# Patient Record
Sex: Male | Born: 1945 | Race: White | Hispanic: No | Marital: Married | Smoking: Former smoker
Health system: Southern US, Community
[De-identification: ages and names within clinical notes are randomized; demographics above are authoritative.]

## PROBLEM LIST (undated history)

## (undated) DIAGNOSIS — I4891 Unspecified atrial fibrillation: Secondary | ICD-10-CM

## (undated) DIAGNOSIS — I5022 Chronic systolic (congestive) heart failure: Secondary | ICD-10-CM

## (undated) DIAGNOSIS — T7840XA Allergy, unspecified, initial encounter: Secondary | ICD-10-CM

## (undated) DIAGNOSIS — G4730 Sleep apnea, unspecified: Secondary | ICD-10-CM

## (undated) DIAGNOSIS — I255 Ischemic cardiomyopathy: Secondary | ICD-10-CM

## (undated) DIAGNOSIS — J45909 Unspecified asthma, uncomplicated: Secondary | ICD-10-CM

## (undated) DIAGNOSIS — M1990 Unspecified osteoarthritis, unspecified site: Secondary | ICD-10-CM

## (undated) DIAGNOSIS — E119 Type 2 diabetes mellitus without complications: Secondary | ICD-10-CM

## (undated) DIAGNOSIS — I4892 Unspecified atrial flutter: Secondary | ICD-10-CM

## (undated) DIAGNOSIS — H349 Unspecified retinal vascular occlusion: Secondary | ICD-10-CM

## (undated) DIAGNOSIS — I119 Hypertensive heart disease without heart failure: Secondary | ICD-10-CM

## (undated) DIAGNOSIS — I2510 Atherosclerotic heart disease of native coronary artery without angina pectoris: Secondary | ICD-10-CM

## (undated) DIAGNOSIS — E785 Hyperlipidemia, unspecified: Secondary | ICD-10-CM

## (undated) HISTORY — DX: Unspecified asthma, uncomplicated: J45.909

## (undated) HISTORY — DX: Chronic systolic (congestive) heart failure: I50.22

## (undated) HISTORY — DX: Ischemic cardiomyopathy: I25.5

## (undated) HISTORY — PX: NECK SURGERY: SHX720

## (undated) HISTORY — DX: Unspecified atrial fibrillation: I48.91

## (undated) HISTORY — DX: Hypertensive heart disease without heart failure: I11.9

## (undated) HISTORY — PX: TONSILLECTOMY: SUR1361

## (undated) HISTORY — DX: Atherosclerotic heart disease of native coronary artery without angina pectoris: I25.10

## (undated) HISTORY — DX: Allergy, unspecified, initial encounter: T78.40XA

## (undated) HISTORY — PX: COLONOSCOPY: SHX174

## (undated) HISTORY — DX: Hyperlipidemia, unspecified: E78.5

## (undated) HISTORY — DX: Unspecified retinal vascular occlusion: H34.9

## (undated) HISTORY — PX: SKIN SURGERY: SHX2413

## (undated) SURGERY — Surgical Case
Anesthesia: *Unknown

---
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
## 2007-10-03 DIAGNOSIS — I2510 Atherosclerotic heart disease of native coronary artery without angina pectoris: Secondary | ICD-10-CM

## 2007-10-03 HISTORY — PX: CORONARY ANGIOPLASTY: SHX604

## 2007-10-03 HISTORY — DX: Atherosclerotic heart disease of native coronary artery without angina pectoris: I25.10

## 2008-01-31 ENCOUNTER — Encounter: Payer: Self-pay | Admitting: Family Medicine

## 2009-03-29 ENCOUNTER — Ambulatory Visit: Payer: Self-pay | Admitting: Family Medicine

## 2009-03-29 DIAGNOSIS — H409 Unspecified glaucoma: Secondary | ICD-10-CM | POA: Insufficient documentation

## 2009-03-29 DIAGNOSIS — G4733 Obstructive sleep apnea (adult) (pediatric): Secondary | ICD-10-CM

## 2009-03-29 DIAGNOSIS — I10 Essential (primary) hypertension: Secondary | ICD-10-CM

## 2009-03-29 DIAGNOSIS — J45909 Unspecified asthma, uncomplicated: Secondary | ICD-10-CM | POA: Insufficient documentation

## 2009-03-29 DIAGNOSIS — J309 Allergic rhinitis, unspecified: Secondary | ICD-10-CM | POA: Insufficient documentation

## 2009-03-29 DIAGNOSIS — Z9989 Dependence on other enabling machines and devices: Secondary | ICD-10-CM

## 2009-03-29 DIAGNOSIS — I2510 Atherosclerotic heart disease of native coronary artery without angina pectoris: Secondary | ICD-10-CM

## 2009-03-29 DIAGNOSIS — E785 Hyperlipidemia, unspecified: Secondary | ICD-10-CM | POA: Insufficient documentation

## 2009-03-29 DIAGNOSIS — H349 Unspecified retinal vascular occlusion: Secondary | ICD-10-CM | POA: Insufficient documentation

## 2009-03-29 LAB — CONVERTED CEMR LAB: PSA: 1.35 ng/mL (ref 0.10–4.00)

## 2009-04-06 LAB — CONVERTED CEMR LAB
ALT: 35 units/L (ref 0–53)
Albumin: 4.1 g/dL (ref 3.5–5.2)
Alkaline Phosphatase: 29 units/L — ABNORMAL LOW (ref 39–117)
BUN: 11 mg/dL (ref 6–23)
Creatinine, Ser: 0.8 mg/dL (ref 0.4–1.5)
GFR calc non Af Amer: 103.7 mL/min (ref >60)
Glucose, Bld: 125 mg/dL — ABNORMAL HIGH (ref 70–99)
LDL Cholesterol: 52 mg/dL (ref 0–99)
Total CHOL/HDL Ratio: 4
Total Protein: 7.1 g/dL (ref 6.0–8.3)
VLDL: 39.8 mg/dL (ref 0.0–40.0)

## 2009-05-14 ENCOUNTER — Telehealth: Payer: Self-pay | Admitting: Family Medicine

## 2009-05-24 ENCOUNTER — Ambulatory Visit: Payer: Self-pay | Admitting: Family Medicine

## 2009-05-24 DIAGNOSIS — L723 Sebaceous cyst: Secondary | ICD-10-CM | POA: Insufficient documentation

## 2009-05-24 LAB — CONVERTED CEMR LAB: HDL goal, serum: 40 mg/dL

## 2009-05-26 LAB — CONVERTED CEMR LAB
BUN: 14 mg/dL (ref 6–23)
CO2: 32 meq/L (ref 19–32)
Calcium: 9.6 mg/dL (ref 8.4–10.5)
Cholesterol: 165 mg/dL (ref 0–200)
Creatinine, Ser: 0.9 mg/dL (ref 0.4–1.5)
Direct LDL: 87.1 mg/dL
Sodium: 139 meq/L (ref 135–145)
Total CHOL/HDL Ratio: 5

## 2009-06-03 ENCOUNTER — Ambulatory Visit: Payer: Self-pay | Admitting: Family Medicine

## 2009-06-03 DIAGNOSIS — E119 Type 2 diabetes mellitus without complications: Secondary | ICD-10-CM | POA: Insufficient documentation

## 2009-06-11 ENCOUNTER — Telehealth: Payer: Self-pay | Admitting: Family Medicine

## 2009-06-16 ENCOUNTER — Encounter: Payer: Self-pay | Admitting: Family Medicine

## 2009-06-16 ENCOUNTER — Ambulatory Visit: Payer: Self-pay | Admitting: Family Medicine

## 2009-06-22 ENCOUNTER — Encounter: Payer: Self-pay | Admitting: Family Medicine

## 2009-07-16 ENCOUNTER — Ambulatory Visit: Payer: Self-pay | Admitting: Gastroenterology

## 2009-07-26 ENCOUNTER — Ambulatory Visit: Payer: Self-pay | Admitting: Gastroenterology

## 2009-07-26 ENCOUNTER — Encounter: Payer: Self-pay | Admitting: Gastroenterology

## 2009-07-28 ENCOUNTER — Encounter: Payer: Self-pay | Admitting: Gastroenterology

## 2009-08-18 ENCOUNTER — Encounter: Payer: Self-pay | Admitting: Family Medicine

## 2009-08-30 ENCOUNTER — Ambulatory Visit: Payer: Self-pay | Admitting: Family Medicine

## 2009-08-31 LAB — CONVERTED CEMR LAB
AST: 26 units/L (ref 0–37)
Alkaline Phosphatase: 34 units/L — ABNORMAL LOW (ref 39–117)
BUN: 11 mg/dL (ref 6–23)
CO2: 31 meq/L (ref 19–32)
Cholesterol: 133 mg/dL (ref 0–200)
Creatinine, Ser: 0.8 mg/dL (ref 0.4–1.5)
Direct LDL: 72.8 mg/dL
GFR calc non Af Amer: 103.56 mL/min (ref >60)
Glucose, Bld: 114 mg/dL — ABNORMAL HIGH (ref 70–99)
HDL: 30.9 mg/dL — ABNORMAL LOW (ref >39.00)
Potassium: 4.4 meq/L (ref 3.5–5.1)
Total Bilirubin: 0.8 mg/dL (ref 0.3–1.2)
Total CHOL/HDL Ratio: 4
Total Protein: 6.7 g/dL (ref 6.0–8.3)
Triglycerides: 202 mg/dL — ABNORMAL HIGH (ref 0.0–149.0)
VLDL: 40.4 mg/dL — ABNORMAL HIGH (ref 0.0–40.0)

## 2009-09-03 ENCOUNTER — Ambulatory Visit: Payer: Self-pay | Admitting: Family Medicine

## 2009-12-13 ENCOUNTER — Ambulatory Visit: Payer: Self-pay | Admitting: Family Medicine

## 2009-12-13 DIAGNOSIS — M109 Gout, unspecified: Secondary | ICD-10-CM

## 2010-04-11 ENCOUNTER — Ambulatory Visit: Payer: Self-pay | Admitting: Family Medicine

## 2010-04-14 ENCOUNTER — Ambulatory Visit: Payer: Self-pay | Admitting: Family Medicine

## 2010-04-14 DIAGNOSIS — M24819 Other specific joint derangements of unspecified shoulder, not elsewhere classified: Secondary | ICD-10-CM

## 2010-04-14 DIAGNOSIS — M19019 Primary osteoarthritis, unspecified shoulder: Secondary | ICD-10-CM | POA: Insufficient documentation

## 2010-04-14 DIAGNOSIS — M2020 Hallux rigidus, unspecified foot: Secondary | ICD-10-CM

## 2010-04-14 DIAGNOSIS — E1340 Other specified diabetes mellitus with diabetic neuropathy, unspecified: Secondary | ICD-10-CM

## 2010-04-17 LAB — CONVERTED CEMR LAB
AST: 22 units/L (ref 0–37)
Bilirubin, Direct: 0.1 mg/dL (ref 0.0–0.3)
Cholesterol: 138 mg/dL (ref 0–200)
Direct LDL: 71.2 mg/dL
Glucose, Bld: 113 mg/dL — ABNORMAL HIGH (ref 70–99)
Total CHOL/HDL Ratio: 4
Triglycerides: 242 mg/dL — ABNORMAL HIGH (ref 0.0–149.0)
VLDL: 48.4 mg/dL — ABNORMAL HIGH (ref 0.0–40.0)

## 2010-04-19 ENCOUNTER — Ambulatory Visit: Payer: Self-pay | Admitting: Family Medicine

## 2010-04-21 ENCOUNTER — Encounter: Payer: Self-pay | Admitting: Family Medicine

## 2010-04-29 ENCOUNTER — Encounter: Payer: Self-pay | Admitting: Family Medicine

## 2010-05-23 ENCOUNTER — Ambulatory Visit: Payer: Self-pay | Admitting: Family Medicine

## 2010-07-13 ENCOUNTER — Ambulatory Visit: Payer: Self-pay | Admitting: Family Medicine

## 2010-07-13 ENCOUNTER — Telehealth (INDEPENDENT_AMBULATORY_CARE_PROVIDER_SITE_OTHER): Payer: Self-pay | Admitting: *Deleted

## 2010-07-18 LAB — CONVERTED CEMR LAB
ALT: 23 units/L (ref 0–53)
AST: 24 units/L (ref 0–37)
Albumin: 4 g/dL (ref 3.5–5.2)
Alkaline Phosphatase: 27 units/L — ABNORMAL LOW (ref 39–117)
CO2: 32 meq/L (ref 19–32)
Calcium: 9.3 mg/dL (ref 8.4–10.5)
Chloride: 103 meq/L (ref 96–112)
Creatinine, Ser: 1 mg/dL (ref 0.4–1.5)
GFR calc non Af Amer: 78.02 mL/min (ref >60)
Glucose, Bld: 141 mg/dL — ABNORMAL HIGH (ref 70–99)
Hgb A1c MFr Bld: 7.3 % — ABNORMAL HIGH (ref 4.6–6.5)
Potassium: 4.5 meq/L (ref 3.5–5.1)
Total CHOL/HDL Ratio: 5
Uric Acid, Serum: 6.5 mg/dL (ref 4.0–7.8)
VLDL: 36.4 mg/dL (ref 0.0–40.0)

## 2010-07-19 ENCOUNTER — Ambulatory Visit: Payer: Self-pay | Admitting: Family Medicine

## 2010-07-19 DIAGNOSIS — B009 Herpesviral infection, unspecified: Secondary | ICD-10-CM | POA: Insufficient documentation

## 2010-07-19 DIAGNOSIS — L570 Actinic keratosis: Secondary | ICD-10-CM

## 2010-10-24 ENCOUNTER — Other Ambulatory Visit: Payer: Self-pay | Admitting: Family Medicine

## 2010-10-24 ENCOUNTER — Ambulatory Visit
Admission: RE | Admit: 2010-10-24 | Discharge: 2010-10-24 | Payer: Self-pay | Source: Home / Self Care | Attending: Family Medicine | Admitting: Family Medicine

## 2010-10-24 LAB — LIPID PANEL
LDL Cholesterol: 64 mg/dL (ref 0–99)
VLDL: 35.8 mg/dL (ref 0.0–40.0)

## 2010-10-28 ENCOUNTER — Ambulatory Visit
Admission: RE | Admit: 2010-10-28 | Discharge: 2010-10-28 | Payer: Self-pay | Source: Home / Self Care | Attending: Family Medicine | Admitting: Family Medicine

## 2010-10-28 LAB — HM DIABETES FOOT EXAM

## 2010-10-28 LAB — CONVERTED CEMR LAB: HDL goal, serum: 40 mg/dL

## 2010-11-01 NOTE — Assessment & Plan Note (Signed)
Summary: REFER FRM DR. TOWER FOR LEFT SHOULDER PAIN / LFW   Vital Signs:  Patient profile:   65 year old male Height:      70 inches Weight:      239.2 pounds BMI:     34.45 Temp:     99.1 degrees F oral Pulse rate:   80 / minute Pulse rhythm:   regular BP sitting:   112 / 60  (left arm) Cuff size:   regular  Vitals Entered By: Benny Lennert CMA Duncan Dull) (April 14, 2010 11:25 AM)  History of Present Illness: Chief complaint Left Shoulder pain  very pleasant 65 yo gentleman, see at the request of Dr. Milinda Antis, whose PCP is Dr. Ermalene Searing for evaluation of left-sided shoulder pain, has been ongoing  for one year.  Additionally, the patient is also  here for some advice of some neuropathic type pain and forefoot pain as well.  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  Left-hand-dominant.  1. Shoulder pain: Long-standing history, more than a year, pain with reaching across his body, internal rotation,  pain with abduction. Does feel some grinding, popping, and mechanical catching in will have some sudden onset of acute pain rapidly with motions  in the shoulder.  No prior operative interventions.  He's unclear, feels it may sublux slightly, has never had a true dislocation.  No one has manually relocated his joint.  2. Foot pain / parasthesias: patient also brings up some  diffuse paresthesias in all  toes one through 5  bilaterally  and some mild metatarsal dis-comfort. He describes a sensation  of  pressure under his metatarsal heads with walking, notably barefooted.   Allergies (verified): No Known Drug Allergies  Past History:  Past medical, surgical, family and social histories (including risk factors) reviewed, and no changes noted (except as noted below).  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 MI 10/2007...stent placed at [facility] Sees Dr. Betsy Coder in [place]. Treadmill cardiolyte  stable 11/2008 Tonsillectomy Appendectomy 1990s: neck fusion x 2   Family History: Reviewed history from 03/29/2009 and no changes required. father: AAA mother: D brother: DM, CAD,valve issues sister: DM no cancer  Social History: Reviewed history and no changes required.  Review of Systems      See HPI       L abcess improving under arm no fevers, chills, sweats, BS have been well controlled lost a great deal of weight has been using a lot of warm compresses, abcess did drain earlier in the day Otherwise, the pertinent positives and negatives are listed above and in the HPI, otherwise a full review of systems has been reviewed and is negative unless noted positive.  General:  Denies chills, fatigue, and fever. MS:  Complains of joint pain and stiffness; denies joint redness and joint swelling. Neuro:  Complains of tingling.  Physical Exam  General:  Well-developed,well-nourished,in no acute distress; alert,appropriate and cooperative throughout examination Head:  Normocephalic and atraumatic without obvious abnormalities. No apparent alopecia or balding. Ears:  no external deformities.   Nose:  no external deformity.   Lungs:  normal respiratory effort.   Msk:  Shoulder: L Inspection: No muscle wasting or winging Ecchymosis/edema: neg  AC joint, scapula, clavicle: TTP Cervical spine: NT, full ROM Spurling's:  neg Abduction: full, 5/5 Flexion: full, 5/5 IR, full, lift-off: 5/5, PAINFUL ER at neutral: full, 5/5 AC crossover and compression: VERY PAINFUL Neer: MILD Hawkins: MOD POS Drop Test: neg Empty Can: NEG Supraspinatus insertion: NT Bicipital groove: NT Speed's: POS APPREHENSION POS CLUNK POS JOBE RELOCATION  POS MINIMAL TRANSLATION WHEN GLENOHUMERAL HEAD MANIPULATED, BUT STOPPED DUE TO PAIN Yergason's: neg Sulcus sign: neg NO GENERALIZED LAXITY Scapular dyskinesis: none C5-T1 intact Sensation intact Grip 5/5  Extremities:  No clubbing, cyanosis, edema, or deformity noted with normal full range of motion of all joints.   Neurologic:  alert & oriented X3 and gait normal.   Skin:  L axillar with redness, minimally tender to palpation Psych:  Cognition and judgment appear intact. Alert and cooperative with normal attention span and concentration. No apparent delusions, illusions, hallucinations Additional Exam:  B FEET Echymosis: no Edema: no ROM: full LE B Gait: heel toe, non-antalgic MT pain: no Callus pattern: none Lateral Mall: NT Medial Mall: NT Talus: NT Navicular: NT Cuboid: NT Calcaneous: NT Metatarsals: NT - NT PAIN 5th MT: NT Phalanges: NT Achilles: NT Plantar Fascia: NT Fat Pad: NT Peroneals: NT Post Tib: NT Great Toe: MINIMAL L TOE MOTION (H/O TRAUMA) Ant Drawer: neg ATFL: NT CFL: NT Deltoid: NT Long arch: preserved Transverse arch: EXTENSIVE BREAKDOWN, DROPPED 2-4 MT HEADS, EXTENSIVE SPLAYING, BUNION FORMATION Hindfoot breakdown: none Sensation: intact     Impression & Recommendations:  Problem # 1:  ARTHRITIS, 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  Suspect primary source of pain is severe acromioclavicular arthritic  source, with also some rotator cuff tendinopathy.  In this case, I suspect a labral injury,, given his mechanical symptoms, clicking,  and his  examination findings.. All manipulation during examination there was some minimal subluxation and pain, consistent with labral tear. Biomechanical factors and labral pathology would likely lead to a secondary impingement phenomenon.  Recommend addressing  underlying  biomechanical  muscular imbalances,, scapular stabilization, and rotator cuff strengthening. If underlying labral pathology is  present, symptoms continue,  this is a case aware earlier  operative intervention could be considered  given significant involvement at the achromic clavicular joint.  Formal PT. Anatomy discussed. >45 minutes spent in total face to face time with the patient with >50% of time spent in counselling and coordination of care: discussed with him,  extensive review of the anatomy, and also  after my shoulder  evaluation, the patient wanted to discuss his feet with me as well.  cc: Dr. Milinda Antis and Ermalene Searing  Problem # 2:  SHOULDER PAIN (ICD-719.41) x-rays, less shoulder series. Indication pain There is extensive acromioclavicular  arthritic changes.. Type II acromion. Minimal glenohumeral osteoarthritic involvement.  No evidence of occult fracture.  His updated medication list for this problem includes:    Aspirin 81 Mg Tabs (Aspirin) .Marland Kitchen... 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 metatarsal squeezing does not elicit pain, and he does not really have pain where one would expect with a neuroma. He certainly could have diabetic peripheral neuropathy.  He was reluctant to hear this.  At followup, I am that haven't bring  all shoes, and readdress his foot situation  for now, and an ablation intermetatarsal cookies and see if this does help some of his foot symptomatology. 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 This will never fully recover, due to trauma and will permanently significantly alter gait.  Problem # 7:  SHOULDER JOINT INSTABILITY (ICD-718.81)  Complete Medication List: 1)  Avapro 300 Mg Tabs (Irbesartan) .... Take 1 tablet by mouth once a day 2)  Crestor 10 Mg Tabs (Rosuvastatin calcium) .... Take 1 tablet by mouth once a day 3)  Provigil 200 Mg Tabs (Modafinil) .... Take 2 tablets by mouth once daily 4)  Aspirin 81 Mg Tabs (Aspirin) .... Take 1 tablet by mouth once a day 5)  Vitamin C 500 Mg Tabs (Ascorbic acid) .... 1,000 mg. once daily 6)  Vitamin D 1000 Unit Tabs (Cholecalciferol) .... Takes 2,000 mg. once daily 7)  Bystolic 10 Mg Tabs (Nebivolol hcl) .... Take 1 tablet by mouth once a day 8)  Freestyle Lite Test Strp (Glucose blood) .... Check blood sugar two times a day 9)  Freestyle Lancets Misc (Lancets) .... Check blood sugar two times a day 10)  Fish Oil Oil (Fish oil) .... Takes 2000mg  twice daily 11)  Septra Ds 800-160 Mg Tabs (Sulfamethoxazole-trimethoprim) .Marland Kitchen.. 1 by mouth two times a day for 10 days  Patient Instructions: 1)  Referral Appointment Information 2)  Day/Date: 3)  Time: 4)  Place/MD: 5)  Address: 6)  Phone/Fax: 7)  Patient given appointment information. Information/Orders faxed/mailed.  8)  f/u 6 weeks  Current Allergies (reviewed today): No known allergies

## 2010-11-01 NOTE — Assessment & Plan Note (Signed)
Summary: ROA FOR 6 WEEK FOLLOW-UP/JRR   Vital Signs:  Patient profile:   65 year old male Height:      70 inches Weight:      244.2 pounds BMI:     35.17 Temp:     98.6 degrees F oral Pulse rate:   80 / minute Pulse rhythm:   regular BP sitting:   108 / 72  (left arm) Cuff size:   regular  Vitals Entered By: Benny Lennert CMA Duncan Dull) (May 23, 2010 8:35 AM)  History of Present Illness: Chief complaint 6 wk follow up  L shoulder: pleasant gentleman, I remember well, he was having some subluxation-type symptoms her left shoulder. Complex shoulder history, and having some secondary impingement. Now is doing well after some scapular stabilization rotator cuff strengthening. He still has some occasional pains in clicking and popping. He also has some a.c. joint arthritis, significant, noted on prior films. Minimal glenohumeral joint arthritis.  diffuse foot problems: multiple traumas, hallux rigidus, and neuropathy. Improved with some metatarsal cookies.  Wife mother, has been working  doing a lot of remodeling.  catches when he sleeps, then it    Allergies (verified): No Known Drug Allergies  Past History:  Past medical, surgical, family and social histories (including risk factors) reviewed, and no changes noted (except as noted below).  Past Medical History: Reviewed history from 04/14/2010 and no changes required. 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 MI 10/2007...stent placed at [facility] Sees Dr. Betsy Coder in [place]. Treadmill cardiolyte  stable 11/2008 Tonsillectomy Appendectomy 1990s: neck fusion x 2   Family History: Reviewed history from 03/29/2009 and no changes required. father: AAA mother: D brother: DM, CAD,valve  issues sister: DM no cancer  Social History: Reviewed history and no changes required.  Review of Systems       REVIEW OF SYSTEMS  GEN: No systemic complaints, no fevers, chills, sweats, or other acute illnesses MSK: Detailed in the HPI GI: tolerating PO intake without difficulty Neuro: improved, occasional tingling still. Otherwise the pertinent positives of the ROS are noted above.    Physical Exam  General:  GEN: Well-developed,well-nourished,in no acute distress; alert,appropriate and cooperative throughout examination HEENT: Normocephalic and atraumatic without obvious abnormalities. No apparent alopecia or balding. Ears, externally no deformities PULM: Breathing comfortably in no respiratory distress EXT: No clubbing, cyanosis, or edema PSYCH: Normally interactive. Cooperative during the interview. Pleasant. Friendly and conversant. Not anxious or depressed appearing. Normal, full affect.  Msk:  Shoulder: L Inspection: No muscle wasting or winging Ecchymosis/edema: neg  AC joint, scapula, clavicle: minimally tender to palpation Cervical spine: NT, full ROM Spurling's: neg Abduction: full, 5/5 Flexion: full, 5/5 IR, full, lift-off: 5/5nontender ER at neutral: full, 5/5 AC crossover and compression: negative Neer:negative Hawkins: negative Drop Test: neg Empty Can: NEG Supraspinatus insertion: NT Bicipital groove: NT Speed's: POS obriens pos Yergason's: neg Sulcus sign: neg NO GENERALIZED LAXITY Scapular dyskinesis: none C5-T1 intact Sensation intact Grip 5/5  Additional Exam:  B FEET Echymosis: no Edema: no ROM: full LE B Gait: heel toe, non-antalgic MT pain: no Callus pattern: none Lateral Mall: NT Medial Mall: NT Talus: NT Navicular: NT Cuboid: NT Calcaneous: NT Metatarsals: NT - NT PAIN 5th MT: NT Phalanges: NT Achilles: NT Plantar Fascia: NT Fat Pad: NT Peroneals: NT Post Tib: NT Great Toe: MINIMAL L TOE MOTION (H/O  TRAUMA) - IMPROVED  COMPARED TO PRIOR Ant Drawer: neg ATFL: NT CFL: NT Deltoid: NT Long arch: preserved Transverse arch: EXTENSIVE BREAKDOWN, DROPPED 2-4 MT HEADS, EXTENSIVE SPLAYING, BUNION FORMATION Hindfoot breakdown: none Sensation: intact    Impression & Recommendations:  Problem # 1:  SHOULDER JOINT INSTABILITY (ICD-718.81) much better, more stable, less symptomatic, and less secondary impingement.  Follow up p.r.n. He is going to continue his scapula stabilization rotator cuff strengthening.  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 joint  Problem # 4:  PERIPHERAL NEUROPATHY (ICD-356.9) given response metatarsal cookies, think he probably does haveA. neuroma that is present, in regardless he is doing significantly better.  Complete Medication List: 1)  Avapro 300 Mg Tabs (Irbesartan) .... Take 1 tablet by mouth once a day 2)  Crestor 10 Mg Tabs (Rosuvastatin calcium) .... Take 1 tablet by mouth once a day 3)  Provigil 200 Mg Tabs (Modafinil) .... Take 2 tablets by mouth once daily 4)  Aspirin 81 Mg Tabs (Aspirin) .... Take 1 tablet by mouth once a day 5)  Vitamin D 1000 Unit Tabs (Cholecalciferol) .... Takes 2,000 mg. once daily 6)  Bystolic 10 Mg Tabs (Nebivolol hcl) .... Take 1 tablet by mouth once a day 7)  Freestyle Lite Test Strp (Glucose blood) .... Check blood sugar two times a day 8)  Freestyle Lancets Misc (Lancets) .... Check blood sugar two times a day 9)  Fish Oil Oil (Fish oil) .... Takes 2000mg  twice daily 10)  Fenofibrate 160 Mg Tabs (Fenofibrate) .... Take one tablet daily  Other Orders: Metatarsal Cookies ([id number])  Current Allergies (reviewed today): No known allergies

## 2010-11-01 NOTE — Miscellaneous (Signed)
Summary: PT Eval/[facility]  PT Eval/[facility]   Imported By: Lanelle Bal 05/04/2010 11:14:52  _____________________________________________________________________  External Attachment:    Type:   Image     Comment:   External Document

## 2010-11-01 NOTE — Assessment & Plan Note (Signed)
Summary: KNOT UNDER ARM   Vital Signs:  Patient profile:   65 year old male Height:      70 inches Weight:      239.50 pounds BMI:     34.49 Temp:     98.2 degrees F oral Pulse rate:   68 / minute Pulse rhythm:   regular BP sitting:   100 / 70  (left arm) Cuff size:   large  Vitals Entered By: Lewanda Rife LPN (April 11, 2010 2:28 PM) CC: Kbnot under left arm discovered on 04/10/10.   History of Present Illness: has a "cyst" under his L arm that is sore noticed yesterday  has been camping   ? insect bite -- was exp to fire ants  no swimming   no fever lesion is not draining   has chronic L shoulder pain-- is not improved with exercise tends to pop out of joint  wants to see sports med   Allergies (verified): No Known Drug Allergies  Past History:  Past Medical History: Last updated: 12/13/2009  HYPERLIPIDEMIA (ICD-272.4) HYPERTENSION (ICD-401.9) ALLERGIC RHINITIS (ICD-477.9) GLAUCOMA (ICD-365.9) RETINAL VEIN OCCLUSION (ICD-362.30) ASTHMA, CHILDHOOD (ICD-493.00) CAD (ICD-414.00) Gout  Past Surgical History: Last updated: 03/29/2009 MI 10/2007...stent placed at [facility] Sees Dr. Betsy Coder in [place]. Treadmill cardiolyte  stable 11/2008 Tonsillectomy Appendectomy 1990s: neck fusion x 2   Family History: Last updated: 03/29/2009 father: AAA mother: D brother: DM, CAD,valve issues sister: DM no cancer  Review of Systems General:  Denies chills, fatigue, fever, loss of appetite, and malaise. Eyes:  Denies eye irritation. CV:  Denies chest pain or discomfort, palpitations, and swelling of feet. Resp:  Denies cough and wheezing. GI:  Denies abdominal pain, change in bowel habits, and indigestion. MS:  Complains of joint pain; denies joint redness and joint swelling. Derm:  Denies itching, lesion(s), poor wound healing, and rash. Neuro:  Denies numbness and tingling. Endo:  Denies excessive thirst and excessive urination. Heme:  Denies abnormal bruising  and bleeding.  Physical Exam  General:  Overweight appearing male in NAD Head:  normocephalic, atraumatic, and no abnormalities observed.   Eyes:  vision grossly intact, pupils equal, pupils round, and pupils reactive to light.   Neck:  no carotid bruit or thyromegaly no cervical or supraclavicular lymphadenopathy  Lungs:  Normal respiratory effort, chest expands symmetrically. Lungs are clear to auscultation, no crackles or wheezes. Heart:  Normal rate and regular rhythm. S1 and S2 normal without gallop, murmur, click, rub or other extra sounds. Abdomen:  soft and non-tender.   Msk:  L shoulder - decreased rom Extremities:  no edema  Neurologic:  sensation intact to light touch, gait normal, and DTRs symmetrical and normal.   Skin:  1-2 cm firm cyst with erythema in L axilla- very firm and slt tender no drainage or fluctuance no LN noted  Cervical Nodes:  No lymphadenopathy noted Axillary Nodes:  No palpable lymphadenopathy Psych:  normal affect, talkative and pleasant    Impression & Recommendations:  Problem # 1:  ABSCESS, AXILLA, LEFT (ICD-682.3) small firm cyst in L axilla after vacation and inability to shower  will tx with septra ds (pt did have exp to [facility] 2 mo ago) and use warm compress and antibact soap and water  update if worse or not imp in several days  f/u with Dr Ermalene Searing as planned  His updated medication list for this problem includes:    Septra Ds 800-160 Mg Tabs (Sulfamethoxazole-trimethoprim) .Marland Kitchen... 1 by mouth two times  a day for 10 days  Complete Medication List: 1)  Avapro 300 Mg Tabs (Irbesartan) .... Take 1 tablet by mouth once a day 2)  Crestor 10 Mg Tabs (Rosuvastatin calcium) .... Take 1 tablet by mouth once a day 3)  Provigil 200 Mg Tabs (Modafinil) .... Take 2 tablets by mouth once daily 4)  Aspirin 81 Mg Tabs (Aspirin) .... Take 1 tablet by mouth once a day 5)  Vitamin C 500 Mg Tabs (Ascorbic acid) .... 1,000 mg. once daily 6)  Vitamin D 1000 Unit  Tabs (Cholecalciferol) .... Takes 2,000 mg. once daily 7)  Bystolic 10 Mg Tabs (Nebivolol hcl) .... Take 1 tablet by mouth once a day 8)  Freestyle Lite Test Strp (Glucose blood) .... Check blood sugar two times a day 9)  Freestyle Lancets Misc (Lancets) .... Check blood sugar two times a day 10)  Fish Oil Oil (Fish oil) .... Takes 2000mg  twice daily 11)  Septra Ds 800-160 Mg Tabs (Sulfamethoxazole-trimethoprim) .Marland Kitchen.. 1 by mouth two times a day for 10 days  Patient Instructions: 1)  we will refer you to Dr Patsy Lager at check out for your shoulder  2)  use warm compress on cyst under your arm 3)  keep clean with antibacterial soap and water -- keep clean expecially if it starts to drain  4)  take the antibiotic as directed (use your sunscreen )  5)  if worse- more redness or fever -- please call and update me  6)  follow up with Dr Ermalene Searing as planned  Prescriptions: SEPTRA DS 800-160 MG TABS (SULFAMETHOXAZOLE-TRIMETHOPRIM) 1 by mouth two times a day for 10 days  #20 x 0   Entered and Authorized by:   Judith Part MD   Signed by:   Judith Part MD on 04/11/2010   Method used:   Electronically to        [facility]* (retail)       [address]       [address] Shipping/mailing       [place], [place]  [address]       Ph: [phone]       Fax: [phone]   RxID:   [id number]   Current Allergies (reviewed today): No known allergies

## 2010-11-01 NOTE — Assessment & Plan Note (Signed)
Summary: CPX/JRR   Vital Signs:  Patient profile:   65 year old male Height:      70 inches Weight:      246.0 pounds BMI:     35.42 Temp:     99.0 degrees F oral Pulse rate:   80 / minute Pulse rhythm:   regular BP sitting:   130 / 72  (left arm) Cuff size:   regular  Vitals Entered By: Benny Lennert CMA Duncan Dull) (July 19, 2010 2:53 PM)  History of Present Illness: Chief complaint cpx The patient is here for annual wellness exam and preventative care.     He also has the following chronic and acute issues:   DM, worsened control...fasting blood sugars occ 274. 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  Lost 10 lbs in past few weeks.   Shoulder pain and foot pain improved.   In last few weeks..several fever blisters at line of lip..cannot get them to go away.  Sinus opacification seen by scan by dentist in eval of root canal. Filled with mucus..started on antibiotic...on augmentin..has not started yet Has frequent cough daily x 6 weeks. Plans on procedure at end of year with maxillofacial surgeon.  2 areas of ? AK on left upper face and nose.  Has boil on tailbone..burst last night. Warm compresses.  Hypertension History:      He denies headache, chest pain, palpitations, dyspnea with exertion, peripheral edema, syncope, and side effects from treatment.  At goal <130/80.        Positive major cardiovascular risk factors include male age 49 years old or older, diabetes, hyperlipidemia, and hypertension.        Positive history for target organ damage include ASHD (either angina/prior MI/prior CABG).    Lipid Management History:      Positive NCEP/ATP III risk factors include male age 59 years old or older, diabetes, HDL cholesterol less than 40, hypertension, and ASHD (either angina/prior MI/prior CABG).        The patient states that he knows about the "Therapeutic Lifestyle Change" diet.  His compliance with the TLC diet is  fair.  The patient expresses understanding of adjunctive measures for cholesterol lowering.  Adjunctive measures started by the patient include aerobic exercise, fiber, and weight reduction.  Comments: triglycerides elevated but better on fenofibrate.     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) 5)  Peripheral Neuropathy  (ICD-356.9) 6)  Abscess, Axilla, Left  (ICD-682.3) 7)  Gout  (ICD-274.9) 8)  Shoulder Pain  (ICD-719.41) 9)  Metatarsalgia  (ICD-726.70) 10)  Dm  (ICD-250.00) 11)  Sebaceous Cyst  (ICD-706.2) 12)  Physical Examination  (ICD-V70.0) 13)  Screening, Colon Cancer  (ICD-V76.51) 14)  Special Screening Malignant Neoplasm of Prostate  (ICD-V76.44) 15)  Cpap  () 16)  Sleep Apnea  (ICD-780.57) 17)  Hyperlipidemia  (ICD-272.4) 18)  Hypertension  (ICD-401.9) 19)  Allergic Rhinitis  (ICD-477.9) 20)  Glaucoma  (ICD-365.9) 21)  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 Take 1 Tablet By Mouth Once A Day 2)  Crestor 10 Mg Tabs (Rosuvastatin Calcium) .... Take 1 Tablet By Mouth Once A Day 3)  Provigil 200 Mg Tabs (  Modafinil) .... Take 2 Tablets By Mouth Once Daily 4)  Aspirin 81 Mg  Tabs (Aspirin) .... Take 1 Tablet By Mouth Once A Day 5)  Vitamin D 1000 Unit  Tabs (Cholecalciferol) .... Takes 2,000 Mg. Once Daily 6)  Bystolic 10 Mg Tabs (Nebivolol Hcl) .... Take 1 Tablet By Mouth Once A Day 7)  Freestyle Lite Test  Strp (Glucose Blood) .... Check Blood Sugar Two Times A Day 8)  Freestyle Lancets  Misc (Lancets) .... Check Blood Sugar Two Times A Day 9)  Fish Oil   Oil (Fish Oil) .... Takes 2000mg  Twice Daily 10)  Fenofibrate 160 Mg Tabs (Fenofibrate) .... Take One Tablet Daily  Current  Medications (verified): 1)  Avapro 300 Mg Tabs (Irbesartan) .... Take 1 Tablet By Mouth Once A Day 2)  Crestor 10 Mg Tabs (Rosuvastatin Calcium) .... Take 1 Tablet By Mouth Once A Day 3)  Provigil 200 Mg Tabs (Modafinil) .... Take 2 Tablets By Mouth Once Daily 4)  Aspirin 81 Mg  Tabs (Aspirin) .... Take 1 Tablet By Mouth Once A Day 5)  Vitamin D 1000 Unit  Tabs (Cholecalciferol) .... Takes 2,000 Mg. Once Daily 6)  Bystolic 10 Mg Tabs (Nebivolol Hcl) .... Take 1 Tablet By Mouth Once A Day 7)  Freestyle Lite Test  Strp (Glucose Blood) .... Check Blood Sugar Two Times A Day 8)  Freestyle Lancets  Misc (Lancets) .... Check Blood Sugar Two Times A Day 9)  Fish Oil   Oil (Fish Oil) .... Takes 2000mg  Twice Daily 10)  Fenofibrate 160 Mg Tabs (Fenofibrate) .... Take One Tablet Daily  Allergies (verified): No Known Drug Allergies  Past History:  Past medical, surgical, family and social histories (including risk factors) reviewed, and no changes noted (except as noted below).  Past Medical History: Reviewed history from 04/14/2010 and no changes required. 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 Betsy Coder in [place]. Treadmill cardiolyte  stable 11/2008 Tonsillectomy Appendectomy 1990s: neck fusion x 2   Family History: Reviewed history from 03/29/2009 and no changes required. father: AAA mother: D brother: DM, CAD,valve issues sister: DM no cancer  Social History: Reviewed history and no changes required.  Review of Systems General:  Denies fatigue and fever. CV:  Denies chest pain or discomfort. Resp:  Denies shortness of breath. GI:  Denies abdominal pain. GU:  Denies dysuria.  Physical  Exam  General:  overweight male in NAd Ears:  External ear exam shows no significant lesions or deformities.  Otoscopic examination reveals clear canals, tympanic membranes are intact bilaterally without bulging, retraction, inflammation or discharge. Hearing is grossly normal bilaterally. Nose:  External nasal examination shows no deformity or inflammation. Nasal mucosa are pink and moist without lesions or exudates. Mouth:  Oral mucosa and oropharynx without lesions or exudates.  Teeth in good repair. Neck:  no carotid bruit or thyromegaly no cervical or supraclavicular lymphadenopathy  Lungs:  Normal respiratory effort, chest expands symmetrically. Lungs are clear to auscultation, no crackles or wheezes. Heart:  Normal rate and regular rhythm. S1 and S2 normal without gallop, murmur, click, rub or other extra sounds. Abdomen:  Bowel sounds positive,abdomen soft and non-tender without masses, organomegaly or hernias noted. Rectal:  No external abnormalities noted. Normal sphincter tone. No rectal masses or tenderness. Genitalia:  Testes bilaterally descended without  nodularity, tenderness or masses. No scrotal masses or lesions. No penis lesions or urethral discharge. Prostate:  Prostate gland firm and smooth, no enlargement, nodularity, tenderness, mass, asymmetry or induration. Pulses:  R and L posterior tibial pulses are full and equal bilaterally  Extremities:  no edema Skin:  healing infected cyst/abcess left upper gluteal crease  flacky lesion pink background on nose and left upper cheek. Psych:  Cognition and judgment appear intact. Alert and cooperative with normal attention span and concentration. No apparent delusions, illusions, hallucinations   Impression & Recommendations:  Problem # 1:  Preventive Health Care (ICD-V70.0) The patient's preventative maintenance and recommended screening tests for an annual wellness exam were reviewed in full today. Brought up to date unless  services declined.  Counselled on the importance of diet, exercise, and its role in overall health and mortality. The patient's FH and SH was reviewed, including their home life, tobacco status, and drug and alcohol status.     Problem # 2:  DM (ICD-250.00) Inadequate control. Get back on track with lifestyle changes. Recheck in 3 months..if not at goal add metformin daily.  His updated medication list for this problem includes:    Avapro 300 Mg Tabs (Irbesartan) .Marland Kitchen... Take 1 tablet by mouth once a day    Aspirin 81 Mg Tabs (Aspirin) .Marland Kitchen... Take 1 tablet by mouth once a day  Problem # 3:  HYPERLIPIDEMIA (ICD-272.4) Improved triglycerides on fenofibrate but not at goal yet. Encouraged exercise, weight loss, healthy eating habits.  His updated medication list for this problem includes:    Crestor 10 Mg Tabs (Rosuvastatin calcium) .Marland Kitchen... Take 1 tablet by mouth once a day    Fenofibrate 160 Mg Tabs (Fenofibrate) .Marland Kitchen... Take one tablet daily  Problem # 4:  HYPERTENSION (ICD-401.9) Well controlled. Continue current medication.  His updated medication list for this problem includes:    Avapro 300 Mg Tabs (Irbesartan) .Marland Kitchen... Take 1 tablet by mouth once a day    Bystolic 10 Mg Tabs (Nebivolol hcl) .Marland Kitchen... Take 1 tablet by mouth once a day  Problem # 5:  SEBACEOUS CYST (ICD-706.2)  Refer to derm for removal given recurrent infecitons and bothersome to pt.   Orders: Dermatology Referral (Derma) Pain Clinic Referral (Pain)  Problem # 6:  CAD (ICD-414.00) Stable assymptomatic. Exercising regularly.  His updated medication list for this problem includes:    Avapro 300 Mg Tabs (Irbesartan) .Marland Kitchen... Take 1 tablet by mouth once a day    Aspirin 81 Mg Tabs (Aspirin) .Marland Kitchen... Take 1 tablet by mouth once a day    Bystolic 10 Mg Tabs (Nebivolol hcl) .Marland Kitchen... Take 1 tablet by mouth once a day  Problem # 7:  ACTINIC KERATOSIS (ICD-702.0) 2 area on left upper cheek and central nose..treated with cryotherapy. If  area on nose returns discuss with Derm about Bx.  Orders: Cryotherapy/Destruction benign or premalignant lesion (1st lesion)  (17000) Cryotherapy/Destruction benign or premalignant lesion (2nd-14th lesions) (17003)  Problem # 8:  FEVER BLISTER (ICD-054.9) Valtrex   Complete Medication List: 1)  Avapro 300 Mg Tabs (Irbesartan) .... Take 1 tablet by mouth once a day 2)  Crestor 10 Mg Tabs (Rosuvastatin calcium) .... Take 1 tablet by mouth once a day 3)  Provigil 200 Mg Tabs (Modafinil) .... Take 2 tablets by mouth once daily 4)  Aspirin 81 Mg Tabs (Aspirin) .... Take 1 tablet by mouth once a day 5)  Vitamin D 1000 Unit Tabs (Cholecalciferol) .... Takes 2,000 mg. once daily 6)  Bystolic 10 Mg Tabs (Nebivolol hcl) .... Take 1 tablet by mouth once a day 7)  Freestyle Lite Test Strp (Glucose blood) .... Check blood sugar two times a day 8)  Freestyle Lancets Misc (Lancets) .... Check blood sugar two times a day 9)  Fish Oil Oil (Fish oil) .... Takes 2000mg  twice daily 10)  Fenofibrate 160 Mg Tabs (Fenofibrate) .... Take one tablet daily 11)  Valacyclovir Hcl 1 Gm Tabs (Valacyclovir hcl) .... 2 tab by mouth two times a day x 1 day correction from previous prescription  Hypertension Assessment/Plan:      The patient's hypertensive risk group is category C: Target organ damage and/or diabetes.  Today's blood pressure is 130/72.  His blood pressure goal is < 140/90.  Lipid Assessment/Plan:      Based on NCEP/ATP III, the patient's risk factor category is "history of coronary disease, peripheral vascular disease, cerebrovascular disease, or aortic aneurysm along with either diabetes, current smoker, or LDL > 130 plus HDL < 40 plus triglycerides > 200".  The patient's lipid goals are as follows: Total cholesterol goal is 200; LDL cholesterol goal is 100; HDL cholesterol goal is 40; Triglyceride goal is 150.  His LDL cholesterol goal has been met.    Patient Instructions: 1)  Get back on track with  diet changes.  2)  Please schedule a follow-up appointment in 3 months 30 min   3)  HgBA1c prior to visit  ICD-9: 250.00 ALL 4)  Lipid panel prior to visit ICD-9 :  5)  Consider shingles vaccine ..check with insurance. 6)  Referral Appointment Information 7)  Day/Date: 8)  Time: 9)  Place/MD: 10)  Address: 11)  Phone/Fax: 12)  Patient given appointment information. Information/Orders faxed/mailed.  13)   If area on nose returns discuss with Derm about Bx.  Prescriptions: FREESTYLE LANCETS  MISC (LANCETS) check blood sugar two times a day  #100 x prn   Entered and Authorized by:   Kerby Nora MD   Signed by:   Kerby Nora MD on 07/19/2010   Method used:   Electronically to        [facility]* (retail)       [address]       [address] Shipping/mailing       [place], [place]  [address]       Ph: [phone]       Fax: [phone]   RxID:   [id number] FREESTYLE LITE TEST  STRP (GLUCOSE BLOOD) check blood sugar two times a day  #100 x prn   Entered and Authorized by:   Kerby Nora MD   Signed by:   Kerby Nora MD on 07/19/2010   Method used:   Electronically to        [facility]* (retail)       [address]       [address] Shipping/mailing       [place], [place]  [address]       Ph: [phone]       Fax: [phone]   RxID:   [id number] FENOFIBRATE 160 MG TABS (FENOFIBRATE) TAKE ONE TABLET DAILY  #90 x 3   Entered and Authorized by:   Kerby Nora MD   Signed by:   Kerby Nora MD on 07/19/2010   Method used:   Electronically to        [facility]* (retail)       [address]       [address] Shipping/mailing       [place], [place]  [address]       Ph: [phone]       Fax: [phone]   RxID:   [id number] VALACYCLOVIR HCL 1 GM TABS (VALACYCLOVIR HCL) 1 tab by mouth two times a day x 1 day  #2 x 1   Entered and Authorized by:   Kerby Nora MD   Signed by:   Kerby Nora MD on  07/19/2010   Method used:   Electronically to        [facility]* (retail)       [address]       [address] Shipping/mailing       [place], [place]  [address]       Ph: [phone]       Fax: [phone]   RxID:   [id number]    Orders Added: 1)  Dermatology Referral [Derma] 2)  Pain Clinic Referral [Pain] 3)  Est. Patient 40-64 years [99396] 4)  Cryotherapy/Destruction benign or premalignant lesion (1st lesion)  [17000] 5)  Cryotherapy/Destruction benign or premalignant lesion (2nd-14th lesions) [17003]    Current Allergies (reviewed today): No known allergies  Flu Vaccine Result Date:  07/19/2010 Flu Vaccine Result:  given Flu Vaccine Next Due:  1 yr Hemoccult Next Due:  Not Indicated Last PSA Result:  0.75 (07/13/2010 9:19:01 AM) PSA Next Due:  1 yr  Appended Document: CPX/JRR    Clinical Lists Changes  Orders: Added new Service order of Flu Vaccine 32yrs + [phone]) - Signed Added new Service order of Admin 1st Vaccine (60630) - Signed Observations: Added new observation of FLU VAX#1VIS: 04/26/10 version given July 19, 2010. (07/19/2010 15:53) Added new observation of FLU VAXLOT: [id number] (07/19/2010 15:53) Added new observation of FLU VAX EXP: 04/01/2011 (07/19/2010 15:53) Added new observation of FLU VAXBY: Heather Woodard CMA (AAMA) (07/19/2010 15:53) Added new observation of FLU VAXRTE: IM (07/19/2010 15:53) Added new observation of FLU VAX DSE: 0.5 ml (07/19/2010 15:53) Added new observation of FLU VAXMFR: GlaxoSmithKline (07/19/2010 15:53) Added new observation of FLU VAX SITE: left deltoid (07/19/2010 15:53) Added new observation of FLU VAX: Fluvax 3+ (07/19/2010 15:53)       Immunizations Administered:  Influenza Vaccine # 1:    Vaccine Type: Fluvax 3+    Site: left deltoid    Mfr: GlaxoSmithKline    Dose: 0.5 ml    Route: IM    Given by: Benny Lennert CMA (AAMA)    Exp. Date: 04/01/2011    Lot #:  [id number]    VIS given: 04/26/10 version given July 19, 2010.  Flu Vaccine Consent Questions:    Do you have a history of severe allergic reactions to this vaccine? no    Any prior history of allergic reactions to egg and/or gelatin? no    Do you have a sensitivity to the preservative Thimersol? no    Do you have a past history of Guillan-Barre Syndrome? no    Do you currently have an acute febrile illness? no    Have you ever had a severe reaction to latex? no    Vaccine information given and explained to patient? yes

## 2010-11-01 NOTE — Miscellaneous (Signed)
  Clinical Lists Changes  Medications: Added new medication of FENOFIBRATE 160 MG TABS (FENOFIBRATE) TAKE ONE TABLET DAILY - Signed Rx of FENOFIBRATE 160 MG TABS (FENOFIBRATE) TAKE ONE TABLET DAILY;  #90 x 0;  Signed;  Entered by: Benny Lennert CMA (AAMA);  Authorized by: Kerby Nora MD;  Method used: Electronically to [facility] [address], [address] Shipping/mailing, [place], [place]  [address], Ph: [phone], Fax: [phone]    Prescriptions: FENOFIBRATE 160 MG TABS (FENOFIBRATE) TAKE ONE TABLET DAILY  #90 x 0   Entered by:   Benny Lennert CMA (AAMA)   Authorized by:   Kerby Nora MD   Signed by:   Benny Lennert CMA (AAMA) on 04/21/2010   Method used:   Electronically to        [facility]* (retail)       [address]       [address] Shipping/mailing       [place], [place]  [address]       Ph: [phone]       Fax: [phone]   RxID:   [id number]   Prior Medications: AVAPRO 300 MG TABS (IRBESARTAN) Take 1 tablet by mouth once a day CRESTOR 10 MG TABS (ROSUVASTATIN CALCIUM) Take 1 tablet by mouth once a day PROVIGIL 200 MG TABS (MODAFINIL) Take 2 tablets by mouth once daily ASPIRIN 81 MG  TABS (ASPIRIN) Take 1 tablet by mouth once a day VITAMIN C 500 MG  TABS (ASCORBIC ACID) 1,000 mg. once daily VITAMIN D 1000 UNIT  TABS (CHOLECALCIFEROL) Takes 2,000 mg. once daily BYSTOLIC 10 MG TABS (NEBIVOLOL HCL) Take 1 tablet by mouth once a day FREESTYLE LITE TEST  STRP (GLUCOSE BLOOD) check blood sugar two times a day FREESTYLE LANCETS  MISC (LANCETS) check blood sugar two times a day FISH OIL   OIL (FISH OIL) takes 2000mg  twice daily SEPTRA DS 800-160 MG TABS (SULFAMETHOXAZOLE-TRIMETHOPRIM) 1 by mouth two times a day for 10 days Current Allergies: No known allergies

## 2010-11-01 NOTE — Assessment & Plan Note (Signed)
Summary: ROA 6 MTHS CYD R/S FROM 03/25/10   Vital Signs:  Patient profile:   65 year old male Height:      70 inches Weight:      239.2 pounds BMI:     34.30 Temp:     99.1 degrees F oral Pulse rate:   80 / minute Pulse rhythm:   regular BP sitting:   110 / 62  (left arm) Cuff size:   regular  Vitals Entered By: Benny Lennert CMA Duncan Dull) (April 19, 2010 3:37 PM)  History of Present Illness: Chief complaint 6 month follow up  DM, stable with lifestyle changes. No meds.   HTN, well controlled.. no lightheadedness.  No chest pain, no SOB.   Left axillae..lump decreased in size...abcess..drained some initially. Keeping clean, warm compresses.  Almost resolved entirely.  Shoulder pain, left ...start PT next week. X-rays showed arthritis.   Lipid Management History:      Positive NCEP/ATP III risk factors include male age 36 years old or older, diabetes, HDL cholesterol less than 40, hypertension, and ASHD (either angina/prior MI/prior CABG).        The patient states that he knows about the "Therapeutic Lifestyle Change" diet.  His compliance with the TLC diet is good.  The patient expresses understanding of adjunctive measures for cholesterol lowering.  Adjunctive measures started by the patient include aerobic exercise, omega-3 supplements, and weight reduction.  He expresses no side effects from his lipid-lowering medication.  The patient denies any symptoms to suggest myopathy or liver disease.  Comments: on crestor 10 .   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) 5)  Peripheral Neuropathy  (ICD-356.9) 6)  Abscess, Axilla, Left  (ICD-682.3) 7)  Gout  (ICD-274.9) 8)  Shoulder Pain  (ICD-719.41) 9)  Metatarsalgia  (ICD-726.70) 10)  Dm  (ICD-250.00) 11)  Sebaceous Cyst  (ICD-706.2) 12)  Physical Examination   (ICD-V70.0) 13)  Screening, Colon Cancer  (ICD-V76.51) 14)  Special Screening Malignant Neoplasm of Prostate  (ICD-V76.44) 15)  Cpap  () 16)  Sleep Apnea  (ICD-780.57) 17)  Hyperlipidemia  (ICD-272.4) 18)  Hypertension  (ICD-401.9) 19)  Allergic Rhinitis  (ICD-477.9) 20)  Glaucoma  (ICD-365.9) 21)  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 Take 1 Tablet By Mouth Once A Day 2)  Crestor 10 Mg Tabs (Rosuvastatin Calcium) .... Take 1 Tablet By Mouth Once A Day 3)  Provigil 200 Mg Tabs (Modafinil) .... Take 2 Tablets By Mouth Once Daily 4)  Aspirin 81 Mg  Tabs (Aspirin) .... Take 1 Tablet By Mouth Once A Day 5)  Vitamin C 500 Mg  Tabs (Ascorbic Acid) .... 1,000 Mg. Once Daily 6)  Vitamin D 1000 Unit  Tabs (Cholecalciferol) .... Takes 2,000 Mg. Once Daily 7)  Bystolic 10 Mg Tabs (Nebivolol Hcl) .... Take 1 Tablet By Mouth Once A Day 8)  Freestyle Lite Test  Strp (Glucose Blood) .... Check Blood Sugar Two Times A Day 9)  Freestyle Lancets  Misc (Lancets) .... Check Blood Sugar Two Times A Day 10)  Fish Oil   Oil (Fish Oil) .... Takes 2000mg  Twice Daily 11)  Septra Ds 800-160 Mg Tabs (Sulfamethoxazole-Trimethoprim) .Marland Kitchen.. 1 By Mouth Two Times A Day For 10 Days  Allergies (verified): No Known Drug Allergies  Past History:  Past medical, surgical, family and social histories (including risk factors) reviewed, and no changes noted (except as noted below).  Past Medical History: Reviewed history from 04/14/2010 and no changes required. 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 Betsy Coder  in [place]. Treadmill cardiolyte  stable 11/2008 Tonsillectomy Appendectomy 1990s: neck fusion x 2   Family History: Reviewed history from 03/29/2009 and no changes required. father: AAA mother: D brother: DM, CAD,valve issues sister: DM no cancer  Social History: Reviewed history and no changes required.  Review of Systems General:  Denies fatigue and fever. CV:  Denies chest pain or discomfort. Resp:  Denies shortness of breath. GI:  Denies abdominal pain. GU:  Denies dysuria.  Physical Exam  General:  Well-developed,well-nourished,in no acute distress; alert,appropriate and cooperative throughout examination Mouth:  MMM Neck:  no carotid bruit or thyromegaly no cervical or supraclavicular lymphadenopathy  Lungs:  normal respiratory effort.   Heart:  Normal rate and regular rhythm. S1 and S2 normal without gallop, murmur, click, rub or other extra sounds. Abdomen:  soft and non-tender.   Pulses:  R and L posterior tibial pulses are full and equal bilaterally  Extremities:  No clubbing, cyanosis, edema, or deformity noted with normal full range of motion of all joints.   Skin:  resolving right axillary abcess, minimal induration and erythema.   Diabetes Management Exam:    Foot Exam (with socks and/or shoes not present):       Sensory-Pinprick/Light touch:          Left medial foot (L-4): normal          Left dorsal foot (L-5): normal          Left lateral foot (S-1): normal          Right medial foot (L-4): normal          Right dorsal foot (L-5): normal          Right lateral foot (S-1): normal       Sensory-Monofilament:          Left foot: normal          Right foot: normal       Inspection:          Left foot: normal          Right foot: normal       Nails:          Left foot: normal          Right foot: normal   Impression & Recommendations:  Problem # 1:  DM (ICD-250.00) Well controlled with lifestyle. His updated medication list for this problem  includes:    Avapro 300 Mg Tabs (Irbesartan) .Marland Kitchen... Take 1 tablet by mouth once a day    Aspirin 81 Mg Tabs (Aspirin) .Marland Kitchen... Take 1 tablet by mouth once a day  Problem # 2:  HYPERLIPIDEMIA (ICD-272.4) LDL well controlled. Continue current medication. Inadequate control on triglycerides...discussed addition of fenofibrate. He will consider and will increase lifestyle change. Continue fish oil 4000 mg daily.  His updated medication list for this problem includes:    Crestor 10 Mg Tabs (Rosuvastatin calcium) .Marland Kitchen... Take 1 tablet by mouth once a day  Problem # 3:  HYPERTENSION (ICD-401.9) Well controlled.  Continue current medication.  His updated medication list for this problem includes:    Avapro 300 Mg Tabs (Irbesartan) .Marland Kitchen... Take 1 tablet by mouth once a day    Bystolic 10 Mg Tabs (Nebivolol hcl) .Marland Kitchen... Take 1 tablet by mouth once a day  Problem # 4:  CAD (ICD-414.00) Needs cardiologist in area. Patient asymptomatic.  His updated medication list for this problem includes:    Avapro 300 Mg Tabs (Irbesartan) .Marland Kitchen... Take 1 tablet by mouth once a day    Aspirin 81 Mg Tabs (Aspirin) .Marland Kitchen... Take 1 tablet by mouth once a day    Bystolic 10 Mg Tabs (Nebivolol hcl) .Marland Kitchen... Take 1 tablet by mouth once a day  Orders: Cardiology Referral (Cardiology)  Problem # 5:  ABSCESS, AXILLA, LEFT (ICD-682.3) Resolving. His updated medication list for this problem includes:    Septra Ds 800-160 Mg Tabs (Sulfamethoxazole-trimethoprim) .Marland Kitchen... 1 by mouth two times a day for 10 days  Complete Medication List: 1)  Avapro 300 Mg Tabs (Irbesartan) .... Take 1 tablet by mouth once a day 2)  Crestor 10 Mg Tabs (Rosuvastatin calcium) .... Take 1 tablet by mouth once a day 3)  Provigil 200 Mg Tabs (Modafinil) .... Take 2 tablets by mouth once daily 4)  Aspirin 81 Mg Tabs (Aspirin) .... Take 1 tablet by mouth once a day 5)  Vitamin C 500 Mg Tabs (Ascorbic acid) .... 1,000 mg. once daily 6)  Vitamin D 1000 Unit Tabs  (Cholecalciferol) .... Takes 2,000 mg. once daily 7)  Bystolic 10 Mg Tabs (Nebivolol hcl) .... Take 1 tablet by mouth once a day 8)  Freestyle Lite Test Strp (Glucose blood) .... Check blood sugar two times a day 9)  Freestyle Lancets Misc (Lancets) .... Check blood sugar two times a day 10)  Fish Oil Oil (Fish oil) .... Takes 2000mg  twice daily 11)  Septra Ds 800-160 Mg Tabs (Sulfamethoxazole-trimethoprim) .Marland Kitchen.. 1 by mouth two times a day for 10 days  Lipid Assessment/Plan:      Based on NCEP/ATP III, the patient's risk factor category is "history of coronary disease, peripheral vascular disease, cerebrovascular disease, or aortic aneurysm along with either diabetes, current smoker, or LDL > 130 plus HDL < 40 plus triglycerides > 200".  The patient's lipid goals are as follows: Total cholesterol goal is 200; LDL cholesterol goal is 100; HDL cholesterol goal is 40; Triglyceride goal is 150.  His LDL cholesterol goal has been met.    Patient Instructions: 1)  Schedule CPX in 3 months with labs prior 2)  Recheck fasting LIPIDS, A1C in 3 months Dx 272.0,250.00    3)  Referral Appointment Information 4)  Day/Date: 5)  Time: 6)  Place/MD: 7)  Address: 8)  Phone/Fax: 9)  Patient given appointment information. Information/Orders faxed/mailed.   Current Allergies (reviewed today): No known allergies

## 2010-11-01 NOTE — Assessment & Plan Note (Signed)
Summary: ? GOUT   Vital Signs:  Patient profile:   65 year old male Height:      70 inches Weight:      233.6 pounds BMI:     33.64 Temp:     98.0 degrees F oral Pulse rate:   80 / minute Pulse rhythm:   regular BP sitting:   100 / 70  (left arm) Cuff size:   regular  Vitals Entered By: Benny Lennert CMA Duncan Dull) (December 13, 2009 3:04 PM)  History of Present Illness: Chief complaint ? gout  65 year old with gout attack:  Monday a week ago, feet have been hurting.  Wife manipulated his great toe some Now has redness, warmth, pain in 1st MTP joint on the R No history of gout, pseudogout. No trauma  Some pressure makes it feel better.  Allergies (verified): No Known Drug Allergies  Past History:  Past medical, surgical, family and social histories (including risk factors) reviewed, and no changes noted (except as noted below).  Past Medical History:  HYPERLIPIDEMIA (ICD-272.4) HYPERTENSION (ICD-401.9) ALLERGIC RHINITIS (ICD-477.9) GLAUCOMA (ICD-365.9) RETINAL VEIN OCCLUSION (ICD-362.30) ASTHMA, CHILDHOOD (ICD-493.00) CAD (ICD-414.00) Gout  Past Surgical History: Reviewed history from 03/29/2009 and no changes required. 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 Betsy Coder in [place]. Treadmill cardiolyte  stable 11/2008 Tonsillectomy Appendectomy 1990s: neck fusion x 2   Family History: Reviewed history from 03/29/2009 and no changes required. father: AAA mother: D brother: DM, CAD,valve issues sister: DM no cancer  Social History: Reviewed history and no changes required.  Review of Systems       REVIEW OF SYSTEMS  GEN: No systemic complaints, no fevers, chills, sweats, or other acute illnesses MSK: Detailed in the HPI GI: tolerating PO intake without difficulty Neuro: No numbness, parasthesias, or tingling associated. Otherwise the pertinent positives of the ROS are noted above.    Physical Exam  General:  GEN:  Well-developed,well-nourished,in no acute distress; alert,appropriate and cooperative throughout examination HEENT: Normocephalic and atraumatic without obvious abnormalities. No apparent alopecia or balding. Ears, externally no deformities PULM: Breathing comfortably in no respiratory distress EXT: No clubbing, cyanosis, or edema PSYCH: Normally interactive. Cooperative during the interview. Pleasant. Friendly and conversant. Not anxious or depressed appearing. Normal, full affect.    Foot/Ankle Exam  Foot Exam:    Right:    Inspection:  Abnormal    Palpation:  Abnormal    Stability:  stable    Tenderness:  yes    Swelling:  yes    Erythema:  yes    1st MTP    Range of Motion:       Hallux MTP Dorsiflex-Active: 5       Hallux MTP Plantar Flex-Active: 15       Hallux IP-Active: restricted       Hallux MTP Dorsiflex-Passive: 5       Hallux MTP Plantar Flex-Passive: 15       Hallux IP-Passive: restricted    Left:    Inspection:  Normal    Palpation:  Normal    Stability:  stable    Tenderness:  no    Swelling:  no    Erythema:  no    Range of Motion:       Hallux MTP Dorsiflex-Active: 60       Hallux MTP Plantar Flex-Active: 45       Hallux IP-Active: full       Hallux MTP Dorsiflex-Passive: 60       Hallux  MTP Plantar Flex-Passive: 45       Hallux IP-Passive: full  Anterior Drawer:    Right negative; Left negative   Impression & Recommendations:  Problem # 1:  GOUT (ICD-274.9) Assessment New Non-crystal proven gout, or pseudogout  Treat with colchicine and indocin restrict activities if not improving in several days would inject MTP, aspirate fluid  The following medications were removed from the medication list:    Meloxicam 7.5 Mg Tabs (Meloxicam) .Marland Kitchen... 1-2 tabs by mouth daily as needed shoulder and foot pain. His updated medication list for this problem includes:    Colcrys 0.6 Mg Tabs (Colchicine) .Marland Kitchen... 1 by mouth two times a day    Indomethacin 50 Mg Caps  (Indomethacin) .Marland Kitchen... 1 by mouth three times a day  Complete Medication List: 1)  Avapro 300 Mg Tabs (Irbesartan) .... Take 1 tablet by mouth once a day 2)  Crestor 10 Mg Tabs (Rosuvastatin calcium) .... Take 1 tablet by mouth once a day 3)  Provigil 200 Mg Tabs (Modafinil) .... Take 2 tablets by mouth once daily 4)  Aspirin 81 Mg Tabs (Aspirin) .... Take 1 tablet by mouth once a day 5)  Vitamin C 500 Mg Tabs (Ascorbic acid) .... 1,000 mg. once daily 6)  Vitamin D 1000 Unit Tabs (Cholecalciferol) .... Takes 2,000 mg. once daily 7)  Fish Oil Oil (Fish oil) .... Takes 2400 mg. twice  daily 8)  Bystolic 10 Mg Tabs (Nebivolol hcl) .... Take 1 tablet by mouth once a day 9)  Freestyle Lite Test Strp (Glucose blood) .... Check blood sugar two times a day 10)  Freestyle Lancets Misc (Lancets) .... Check blood sugar two times a day 11)  Colcrys 0.6 Mg Tabs (Colchicine) .Marland Kitchen.. 1 by mouth two times a day 12)  Indomethacin 50 Mg Caps (Indomethacin) .Marland Kitchen.. 1 by mouth three times a day Prescriptions: INDOMETHACIN 50 MG CAPS (INDOMETHACIN) 1 by mouth three times a day  #30 x 0   Entered and Authorized by:   Hannah Beat MD   Signed by:   Hannah Beat MD on 12/13/2009   Method used:   Electronically to        [facility]* (retail)       [address]       [address] Shipping/mailing       [place], [place]  [address]       Ph: [phone]       Fax: [phone]   RxID:   [id number] COLCRYS 0.6 MG TABS (COLCHICINE) 1 by mouth two times a day  #40 x 0   Entered and Authorized by:   Hannah Beat MD   Signed by:   Hannah Beat MD on 12/13/2009   Method used:   Electronically to        [facility]* (retail)       [address]       [address] Shipping/mailing       [place], [place]  [address]       Ph: [phone]       Fax: [phone]   RxID:   [id number]   Current Allergies (reviewed today): No known allergies

## 2010-11-01 NOTE — Progress Notes (Signed)
----   Converted from flag ---- ---- 07/12/2010 5:28 PM, Kerby Nora MD wrote: Dx 250.00, v76.44 Lipids, CMET, A1C, PSA  ---- 07/12/2010 2:38 PM, Melody Comas wrote: Patient is having cpx labs tomorrow. What labs to draw and diagnosis please. ------------------------------

## 2010-11-03 NOTE — Assessment & Plan Note (Signed)
Summary: 30 MIN 3 MONTH FOLLOW UP/[facility]   Vital Signs:  Patient profile:   65 year old male Height:      70 inches Weight:      251.25 pounds BMI:     36.18 Temp:     97.6 degrees F oral Pulse rate:   80 / minute Pulse rhythm:   regular BP sitting:   110 / 70  (left arm) Cuff size:   regular  Vitals Entered By: Benny Lennert CMA Duncan Dull) (October 28, 2010 3:17 PM)  History of Present Illness: Chief complaint 3 MONTH FOLLOW UP  DM:20 lb weight gain since over the holidays. Worsened control of DM on no medicaiton.   Got back on track with diet.Marland Kitchen exercsie...FBS were in 200s. Now better... FBS 105 -118 in last month.   High triglycerides continue to improve.Marland Kitchen although not at goal <150. Continue fenofibrate, fish oil and crestor.  LDL remains at goal <70.  Hx of CAD.  To be followed by Dr. Mariah Milling. HAs not had first appt yet.   Sleep apnea... on CPAP.Marland Kitchen seeing pulmonologist/sleep disorders. He wants [place] to prescribe provigil... doing very well on this med.  I am okay with this.Marland Kitchenonce we get records from pulm,.    Hypertension History:      Well controlled on current meds.  Wants cheaper med than avapro.        Positive major cardiovascular risk factors include male age 23 years old or older, diabetes, hyperlipidemia, and hypertension.        Positive history for target organ damage include ASHD (either angina/prior MI/prior CABG).    Lipid Management History:      Positive NCEP/ATP III risk factors include male age 66 years old or older, diabetes, HDL cholesterol less than 40, hypertension, and ASHD (either angina/prior MI/prior CABG).        The patient states that he knows about the "Therapeutic Lifestyle Change" diet.  His compliance with the TLC diet is fair.      Allergies (verified): No Known Drug Allergies  Past History:  Past medical, surgical, family and social histories (including risk factors) reviewed, and no changes noted (except as noted below).  Past Medical  History: Reviewed history from 04/14/2010 and no changes required. 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 Sees Dr. Betsy Coder in [place]. Treadmill cardiolyte  stable 11/2008 Tonsillectomy Appendectomy 1990s: neck fusion x 2   Family History: Reviewed history from 03/29/2009 and no changes required. father: AAA mother: D brother: DM, CAD,valve issues sister: DM no cancer  Social History: Reviewed history and no changes required.  Review of Systems General:  Denies fatigue, fever, and loss of appetite. CV:  Denies chest pain or discomfort. Resp:  Denies shortness of breath. GI:  Denies abdominal pain. GU:  Denies dysuria.  Physical Exam  General:  overweight male in N[place] Mouth:  Oral mucosa and oropharynx without lesions or exudates.  Teeth in good repair. Neck:  no carotid bruit or thyromegaly no cervical or supraclavicular lymphadenopathy  Lungs:  Normal respiratory effort, chest expands symmetrically. Lungs are clear to auscultation, no crackles or wheezes. Heart:  Normal rate and regular rhythm. S1 and S2 normal without gallop, murmur, click, rub or other extra sounds. Abdomen:  Bowel sounds positive,abdomen soft and non-tender without masses, organomegaly or hernias noted. Pulses:  R and L posterior tibial pulses are full and equal bilaterally  Extremities:  no edema  Diabetes Management Exam:    Foot Exam (with socks and/or shoes not present):       Sensory-Pinprick/Light touch:          Left medial foot (L-4): normal          Left dorsal foot (L-5): normal          Left lateral foot (S-1): normal          Right medial foot (L-4): normal          Right dorsal foot (L-5): normal          Right  lateral foot (S-1): normal       Sensory-Monofilament:          Left foot: normal          Right foot: normal       Inspection:          Left foot: normal          Right foot: normal       Nails:          Left foot: normal          Right foot: normal   Impression & Recommendations:  Problem # 1:  DM (ICD-250.00) Assessment Deteriorated Worsened control.. off diet. Now in last month FBS better back on exercise and healthy eating... recheck A1C in 2 months.  His updated medication list for this problem includes:    Losartan Potassium 100 Mg Tabs (Losartan potassium) .Marland Kitchen... Take 1 tablet by mouth once a day    Aspirin 81 Mg Tabs (Aspirin) .Marland Kitchen... Take 1 tablet by mouth once a day  Problem # 2:  HYPERLIPIDEMIA (ICD-272.4) Assessment: Improved  LDL at goal <70 on crestor. Triglycerides improving slowly on fenofibrate and fish oil.  His updated medication list for this problem includes:    Crestor 10 Mg Tabs (Rosuvastatin calcium) .Marland Kitchen... Take 1 tablet by mouth once a day    Fenofibrate 160 Mg Tabs (Fenofibrate) .Marland Kitchen... Take one tablet daily  Labs Reviewed: SGOT: 24 (07/13/2010)   SGPT: 23 (07/13/2010)  Lipid Goals: Chol Goal: 200 (10/28/2010)   HDL Goal: 40 (10/28/2010)   LDL Goal: 70 (10/28/2010)   TG Goal: 150 (10/28/2010)  Prior 10 Yr Risk Heart Disease: N/A (05/24/2009)   HDL:31.00 (10/24/2010), 31.90 (07/13/2010)  LDL:64 (10/24/2010), 87 (07/13/2010)  Chol:131 (10/24/2010), 155 (07/13/2010)  Trig:179.0 (10/24/2010), 182.0 (07/13/2010)  Problem # 3:  HYPERTENSION (ICD-401.9)  Well controlled. Continue current medication.  His updated medication list for this problem includes:    Losartan Potassium 100 Mg Tabs (Losartan potassium) .Marland Kitchen... Take 1 tablet by mouth once a day    Bystolic 10 Mg Tabs (Nebivolol hcl) .Marland Kitchen... Take 1 tablet by mouth once a day  BP today: 110/70 Prior BP: 130/72 (07/19/2010)  Prior 10 Yr Risk Heart Disease: N/A (05/24/2009)  Labs Reviewed: K+: 4.5  (07/13/2010) Creat: : 1.0 (07/13/2010)   Chol: 131 (10/24/2010)   HDL: 31.00 (10/24/2010)   LDL: 64 (10/24/2010)   TG: 179.0 (10/24/2010)  Problem # 4:  CAD (ICD-414.00) Upcoming appt with Dr. Mariah Milling for maintanance.  His updated medication list for this problem includes:    Losartan Potassium 100 Mg Tabs (Losartan potassium) .Marland Kitchen... Take 1 tablet by mouth once a day    Aspirin 81 Mg Tabs (Aspirin) .Marland Kitchen... Take 1 tablet by mouth once a day    Bystolic 10 Mg Tabs (  Nebivolol hcl) .Marland Kitchen... Take 1 tablet by mouth once a day  Problem # 5:  SLEEP APNEA (ICD-780.57) On CPAP... will refill provigil once last note obtained from Sleep MD to verify no past abuse etc.   Complete Medication List: 1)  Losartan Potassium 100 Mg Tabs (Losartan potassium) .... Take 1 tablet by mouth once a day 2)  Crestor 10 Mg Tabs (Rosuvastatin calcium) .... Take 1 tablet by mouth once a day 3)  Provigil 200 Mg Tabs (Modafinil) .... Take 2 tablets by mouth once daily 4)  Aspirin 81 Mg Tabs (Aspirin) .... Take 1 tablet by mouth once a day 5)  Vitamin D 1000 Unit Tabs (Cholecalciferol) .... Takes 2,000 mg. once daily 6)  Bystolic 10 Mg Tabs (Nebivolol hcl) .... Take 1 tablet by mouth once a day 7)  Freestyle Lite Test Strp (Glucose blood) .... Check blood sugar two times a day 8)  Freestyle Lancets Misc (Lancets) .... Check blood sugar two times a day 9)  Fish Oil Oil (Fish oil) .... Takes 2000mg  twice daily 10)  Fenofibrate 160 Mg Tabs (Fenofibrate) .... Take one tablet daily  Hypertension Assessment/Plan:      The patient's hypertensive risk group is category C: Target organ damage and/or diabetes.  Today's blood pressure is 110/70.  His blood pressure goal is < 140/90.  Lipid Assessment/Plan:      Based on NCEP/ATP III, the patient's risk factor category is "history of coronary disease, peripheral vascular disease, cerebrovascular disease, or aortic aneurysm along with either diabetes, current smoker, or LDL > 130 plus HDL <  40 plus triglycerides > 200".  The patient's lipid goals are as follows: Total cholesterol goal is 200; LDL cholesterol goal is 70; HDL cholesterol goal is 40; Triglyceride goal is 150.  His LDL cholesterol goal has been met.    Patient Instructions: 1)  Get back on track with diet and exercise.  2)   Change avapro to losartan 100 mg daily. Follow BP at home.. call if >140/90.  3)  Follow up appt in mid april.. pt has specific requested date. 4)  BMP prior to visit, ICD-9: 250.00 5)  Hepatic Panel prior to visit ICD-9:  6)  Lipid panel prior to visit ICD-9 :  7)  HgBA1c prior to visit  ICD-9:  Prescriptions: LOSARTAN POTASSIUM 100 MG TABS (LOSARTAN POTASSIUM) Take 1 tablet by mouth once a day  #30 x 11   Entered and Authorized by:   Kerby Nora MD   Signed by:   Kerby Nora MD on 10/28/2010   Method used:   Electronically to        [facility]* (retail)       [address]       [address] Shipping/mailing       [place], [place]  [address]       Ph: [phone]       Fax: [phone]   RxID:   [id number]    Orders Added: 1)  Est. Patient Level IV [84132]    Current Allergies (reviewed today): No known allergies

## 2010-11-03 NOTE — Letter (Signed)
Summary: Records from [facility] 2007 - 2009  Records from [facility] 2007 - 2009   Imported By: Maryln Gottron 10/25/2010 12:47:14  _____________________________________________________________________  External Attachment:    Type:   Image     Comment:   External Document

## 2010-11-30 ENCOUNTER — Encounter: Payer: Self-pay | Admitting: Family Medicine

## 2011-01-11 ENCOUNTER — Other Ambulatory Visit: Payer: Self-pay | Admitting: Family Medicine

## 2011-01-16 ENCOUNTER — Other Ambulatory Visit: Payer: Self-pay

## 2011-01-17 ENCOUNTER — Ambulatory Visit: Payer: Self-pay | Admitting: Family Medicine

## 2011-03-15 ENCOUNTER — Other Ambulatory Visit: Payer: Self-pay | Admitting: Family Medicine

## 2011-04-07 ENCOUNTER — Other Ambulatory Visit (INDEPENDENT_AMBULATORY_CARE_PROVIDER_SITE_OTHER): Payer: Medicare Other | Admitting: Family Medicine

## 2011-04-07 ENCOUNTER — Ambulatory Visit: Payer: Self-pay | Admitting: Family Medicine

## 2011-04-07 DIAGNOSIS — E119 Type 2 diabetes mellitus without complications: Secondary | ICD-10-CM

## 2011-04-07 LAB — HEPATIC FUNCTION PANEL
Bilirubin, Direct: 0.1 mg/dL (ref 0.0–0.3)
Total Bilirubin: 0.5 mg/dL (ref 0.3–1.2)

## 2011-04-07 LAB — LDL CHOLESTEROL, DIRECT: Direct LDL: 76.2 mg/dL

## 2011-04-07 LAB — BASIC METABOLIC PANEL
BUN: 19 mg/dL (ref 6–23)
Chloride: 104 mEq/L (ref 96–112)
Glucose, Bld: 142 mg/dL — ABNORMAL HIGH (ref 70–99)
Potassium: 4.2 mEq/L (ref 3.5–5.1)

## 2011-04-07 LAB — LIPID PANEL
Total CHOL/HDL Ratio: 4
VLDL: 42.6 mg/dL — ABNORMAL HIGH (ref 0.0–40.0)

## 2011-04-07 LAB — HEMOGLOBIN A1C: Hgb A1c MFr Bld: 7.5 % — ABNORMAL HIGH (ref 4.6–6.5)

## 2011-04-10 ENCOUNTER — Ambulatory Visit (INDEPENDENT_AMBULATORY_CARE_PROVIDER_SITE_OTHER): Payer: 59 | Admitting: Family Medicine

## 2011-04-10 ENCOUNTER — Ambulatory Visit: Payer: Self-pay | Admitting: Family Medicine

## 2011-04-10 ENCOUNTER — Encounter: Payer: Self-pay | Admitting: Family Medicine

## 2011-04-10 DIAGNOSIS — I10 Essential (primary) hypertension: Secondary | ICD-10-CM

## 2011-04-10 DIAGNOSIS — E785 Hyperlipidemia, unspecified: Secondary | ICD-10-CM

## 2011-04-10 DIAGNOSIS — Z125 Encounter for screening for malignant neoplasm of prostate: Secondary | ICD-10-CM

## 2011-04-10 DIAGNOSIS — E119 Type 2 diabetes mellitus without complications: Secondary | ICD-10-CM

## 2011-04-10 DIAGNOSIS — I2510 Atherosclerotic heart disease of native coronary artery without angina pectoris: Secondary | ICD-10-CM

## 2011-04-10 NOTE — Patient Instructions (Signed)
Continue working on exercise, weight loss and diet control. Avoid high fat and high carbohydrate foods.

## 2011-04-10 NOTE — Progress Notes (Signed)
  Subjective:    Patient ID: Tommy Sullivan, male    DOB: March 03, 1946, 65 y.o.   MRN: [id number]  HPI  Hypertension:  Well controlled off cozaar, only on bystolic. Using medication without problems or lightheadedness:  Chest pain with exertion: None Edema:None Short of breath:None Average home BPs: Well controlled. Back to work Architect.  Elevated Cholesterol:On crestor and fenofibrate... Trig remain high but LDL at goal. Using medications without problems: None Muscle aches: None Other complaints: Mother has been sick so he has been on road in [place].  Diabetes: Slight improved control Using medications without difficulties: Hypoglycemic episodes: None Hyperglycemic episodes: Did see greater than 200 when he was in [place] earlier in the 3 months. Feet problems: None. Blood Sugars averaging: 112-140 ... Was higher when in [place]. eye exam within last year:   Shoulder pain resolved as well.  Review of Systems  Constitutional: Negative for fever and fatigue.  HENT: Negative for ear pain.   Eyes: Negative for pain.  Respiratory: Negative for cough and shortness of breath.   Cardiovascular: Negative for chest pain, palpitations and leg swelling.  Gastrointestinal: Negative for abdominal pain, diarrhea and constipation.  Skin: Negative for rash.       Objective:   Physical Exam  Constitutional: Vital signs are normal. He appears well-developed and well-nourished.  HENT:  Head: Normocephalic.  Right Ear: Hearing normal.  Left Ear: Hearing normal.  Nose: Nose normal.  Mouth/Throat: Oropharynx is clear and moist and mucous membranes are normal.  Neck: Trachea normal. Carotid bruit is not present. No mass and no thyromegaly present.  Cardiovascular: Normal rate, regular rhythm and normal pulses.  Exam reveals no gallop, no distant heart sounds and no friction rub.   No murmur heard.      No peripheral edema  Pulmonary/Chest: Effort normal and breath  sounds normal. No respiratory distress.  Skin: Skin is warm, dry and intact. No rash noted.  Psychiatric: He has a normal mood and affect. His speech is normal and behavior is normal. Thought content normal.        Diabetic foot exam: Normal inspection No skin breakdown No calluses  Normal DP pulses Normal sensation to light touch and monofilament Nails normal   Assessment & Plan:

## 2011-04-10 NOTE — Assessment & Plan Note (Signed)
Slightly improved control with lifestyle change. He wishes to continue work on this istead of initiating a medication.  If not at goal next check I will encourage him to try metformin, but he is very hesitant.

## 2011-04-10 NOTE — Assessment & Plan Note (Signed)
LDL almost at goal <70.

## 2011-04-10 NOTE — Assessment & Plan Note (Signed)
Tri remain poorly controlled despite fenofibrate and fish oil. He will try to make further adjustments in his diet and increase activity.

## 2011-06-26 ENCOUNTER — Other Ambulatory Visit: Payer: Self-pay | Admitting: Cardiovascular Disease

## 2011-06-26 ENCOUNTER — Other Ambulatory Visit: Payer: Self-pay | Admitting: Family Medicine

## 2011-06-27 NOTE — Telephone Encounter (Signed)
Please have Dr. Ermalene Searing review.

## 2011-11-20 ENCOUNTER — Other Ambulatory Visit: Payer: Self-pay | Admitting: *Deleted

## 2011-11-20 NOTE — Telephone Encounter (Signed)
Looks like this was removed from patients medication list back in July okay to refill

## 2011-11-21 MED ORDER — LOSARTAN POTASSIUM 100 MG PO TABS: 100.0000 mg | ORAL_TABLET | Freq: Every day | ORAL | Status: DC

## 2011-11-21 NOTE — Telephone Encounter (Signed)
Patient says that dr Ermalene Searing never stopped this medication is it okay to refill

## 2011-11-21 NOTE — Telephone Encounter (Addendum)
Ok to refill, can you put back on med list  1 daily, #90, 1 refill   Completed myself. Nai Borromeo  10:17 AM 11/21/2011

## 2011-11-21 NOTE — Telephone Encounter (Signed)
It looks like amy stopped it, call and ask why he is refilling it?

## 2011-12-25 ENCOUNTER — Other Ambulatory Visit: Payer: Self-pay

## 2011-12-25 MED ORDER — GLUCOSE BLOOD VI STRP: ORAL_STRIP | Status: DC

## 2011-12-25 NOTE — Telephone Encounter (Signed)
Tommy Sullivan outpatient pharmacy request refill freestyle lite test strip #100 x 5.

## 2012-03-27 ENCOUNTER — Other Ambulatory Visit: Payer: Self-pay | Admitting: *Deleted

## 2012-03-27 MED ORDER — ROSUVASTATIN CALCIUM 10 MG PO TABS: 10.0000 mg | ORAL_TABLET | Freq: Every day | ORAL | Status: DC

## 2012-04-22 LAB — HM DIABETES EYE EXAM

## 2012-05-22 ENCOUNTER — Other Ambulatory Visit: Payer: Self-pay | Admitting: "Endocrinology

## 2012-06-24 ENCOUNTER — Other Ambulatory Visit: Payer: Self-pay | Admitting: *Deleted

## 2012-06-24 MED ORDER — ROSUVASTATIN CALCIUM 10 MG PO TABS: 10.0000 mg | ORAL_TABLET | Freq: Every day | ORAL | Status: DC

## 2012-06-26 ENCOUNTER — Telehealth: Payer: Self-pay | Admitting: *Deleted

## 2012-06-26 ENCOUNTER — Other Ambulatory Visit: Payer: Self-pay

## 2012-06-26 MED ORDER — NEBIVOLOL HCL 10 MG PO TABS: 10.0000 mg | ORAL_TABLET | Freq: Every day | ORAL | Status: DC

## 2012-06-26 MED ORDER — FENOFIBRATE 160 MG PO TABS: 160.0000 mg | ORAL_TABLET | Freq: Every day | ORAL | Status: DC

## 2012-07-09 ENCOUNTER — Telehealth: Payer: Self-pay | Admitting: Family Medicine

## 2012-07-09 DIAGNOSIS — E785 Hyperlipidemia, unspecified: Secondary | ICD-10-CM

## 2012-07-09 DIAGNOSIS — M109 Gout, unspecified: Secondary | ICD-10-CM

## 2012-07-09 DIAGNOSIS — E119 Type 2 diabetes mellitus without complications: Secondary | ICD-10-CM

## 2012-07-09 NOTE — Telephone Encounter (Signed)
Message copied by Excell Seltzer on Tue Jul 09, 2012 10:00 AM ------      Message from: Alvina Chou      Created: Thu Jul 04, 2012 12:49 PM      Regarding: lab orders for Wednesday 10.9.13       Patient is scheduled for CPX labs, please order future labs, Thanks , Camelia Eng

## 2012-07-10 ENCOUNTER — Other Ambulatory Visit: Payer: Medicare Other

## 2012-07-17 ENCOUNTER — Other Ambulatory Visit (INDEPENDENT_AMBULATORY_CARE_PROVIDER_SITE_OTHER): Payer: 59

## 2012-07-17 ENCOUNTER — Encounter: Payer: Medicare Other | Admitting: Family Medicine

## 2012-07-17 DIAGNOSIS — E785 Hyperlipidemia, unspecified: Secondary | ICD-10-CM

## 2012-07-17 DIAGNOSIS — E119 Type 2 diabetes mellitus without complications: Secondary | ICD-10-CM

## 2012-07-17 DIAGNOSIS — M109 Gout, unspecified: Secondary | ICD-10-CM

## 2012-07-17 LAB — COMPREHENSIVE METABOLIC PANEL
AST: 30 U/L (ref 0–37)
Albumin: 3.7 g/dL (ref 3.5–5.2)
Alkaline Phosphatase: 27 U/L — ABNORMAL LOW (ref 39–117)
BUN: 16 mg/dL (ref 6–23)
Potassium: 4.2 mEq/L (ref 3.5–5.1)
Total Bilirubin: 0.4 mg/dL (ref 0.3–1.2)

## 2012-07-17 LAB — LIPID PANEL
Cholesterol: 128 mg/dL (ref 0–200)
HDL: 30.8 mg/dL — ABNORMAL LOW (ref >39.00)
Total CHOL/HDL Ratio: 4
Triglycerides: 262 mg/dL — ABNORMAL HIGH (ref 0.0–149.0)
VLDL: 52.4 mg/dL — ABNORMAL HIGH (ref 0.0–40.0)

## 2012-07-23 ENCOUNTER — Ambulatory Visit (INDEPENDENT_AMBULATORY_CARE_PROVIDER_SITE_OTHER): Payer: 59 | Admitting: Family Medicine

## 2012-07-23 ENCOUNTER — Encounter: Payer: Self-pay | Admitting: Family Medicine

## 2012-07-23 VITALS — BP 114/67 | HR 55 | Temp 98.8°F | Ht 71.0 in | Wt 242.0 lb

## 2012-07-23 DIAGNOSIS — E785 Hyperlipidemia, unspecified: Secondary | ICD-10-CM

## 2012-07-23 DIAGNOSIS — E119 Type 2 diabetes mellitus without complications: Secondary | ICD-10-CM

## 2012-07-23 DIAGNOSIS — Z23 Encounter for immunization: Secondary | ICD-10-CM

## 2012-07-23 DIAGNOSIS — I10 Essential (primary) hypertension: Secondary | ICD-10-CM

## 2012-07-23 DIAGNOSIS — I2510 Atherosclerotic heart disease of native coronary artery without angina pectoris: Secondary | ICD-10-CM

## 2012-07-23 DIAGNOSIS — Z0000 Encounter for general adult medical examination without abnormal findings: Secondary | ICD-10-CM

## 2012-07-23 DIAGNOSIS — M109 Gout, unspecified: Secondary | ICD-10-CM

## 2012-07-23 NOTE — Addendum Note (Signed)
Addended by: Criselda Peaches B on: 07/23/2012 04:52 PM   Modules accepted: Orders

## 2012-07-23 NOTE — Assessment & Plan Note (Signed)
Stable uric acid

## 2012-07-23 NOTE — Assessment & Plan Note (Signed)
Asymptomatic, but recommended setting up cardiologist in this area.

## 2012-07-23 NOTE — Assessment & Plan Note (Signed)
LDl at goal <70 given CAD, but trig high and HDl low. Continue crestor and fenofibrate and fish oil. Encouraged exercise, weight loss, healthy eating habits. Recheck in 3 months.

## 2012-07-23 NOTE — Progress Notes (Signed)
  Subjective:    Patient ID: Tommy Sullivan, male    DOB: August 13, 1946, 66 y.o.   MRN: [id number]  HPI I have personally reviewed the Medicare Annual Wellness questionnaire and have noted 1. The patient's medical and social history 2. Their use of alcohol, tobacco or illicit drugs 3. Their current medications and supplements 4. The patient's functional ability including ADL's, fall risks, home safety risks and hearing or visual             impairment. 5. Diet and physical activities 6. Evidence for depression or mood disorders The patients weight, height, BMI and visual acuity have been recorded in the chart I have made referrals, counseling and provided education to the patient based review of the above and I have provided the pt with a written personalized care plan for preventive services.  Mother passed away in June 28, 2012.Marland Kitchen He has not been taking care of DM. Has not gotten back on healthy lifestyle since.. Last week they have started back on diet changes.  Using my fitness pal app. He last 8 lbs.  Elevated Cholesterol: LDL at goal , triglycerides high on crestor 10 mg daily, fenofibrate, fish oil Lab Results  Component Value Date   CHOL 128 07/17/2012   HDL 30.80* 07/17/2012   LDLCALC 64 10/24/2010   LDLDIRECT 66.8 07/17/2012   TRIG 262.0* 07/17/2012   CHOLHDL 4 07/17/2012    Using medications without problems:none Muscle aches: None Diet compliance: see above Exercise:treadmill  Few times a week. Other complaints:  Diabetes:  Lab Results  Component Value Date   HGBA1C 9.0* 07/17/2012  Using medications without difficulties: Hypoglycemic episodes: None Hyperglycemic episodes: earlier in year Feet problems:None Blood Sugars averaging: in last few weeks 140 eye exam within last year:yes  Hypertension:  At goal <130/80 on ACEI, ARB and bystolic  Using medication without problems or lightheadedness:  Chest pain with exertion: None Edema:None Short of breath: None Average  home BPs:Not checking Other issues:  Plans follow up with Dr. Mariah Sullivan. for CAD.   Review of Systems     Objective:   Physical Exam        Assessment & Plan:  The patient's preventative maintenance and recommended screening tests for an annual wellness exam were reviewed in full today. Brought up to date unless services declined.  Counselled on the importance of diet, exercise, and its role in overall health and mortality. The patient's FH and SH was reviewed, including their home life, tobacco status, and drug and alcohol status.   Due for PSA, but not interested.No prostate cancer in family. Refuses rectal exam this year. Lab Results  Component Value Date   PSA 0.75 07/13/2010   PSA 1.35 03/29/2009  Vaccines:Due for PNA, flu, and shingles uptodate with TD Colon: 2010 Kaplan, tubular adenoma, due in 5 years, 2015.

## 2012-07-23 NOTE — Patient Instructions (Addendum)
Stay on track with healthy eating (low carb), regular exercise and weight loss. Return in 3 month with fasting labs prior for DM follow up.  Look into shingles vaccine.

## 2012-07-23 NOTE — Assessment & Plan Note (Signed)
Well controlled. Continue current medication.  

## 2012-07-23 NOTE — Assessment & Plan Note (Signed)
Discussed getting back on track with lifestyle. He has already begun this. Blood sugars are coming s=down. Will recheck in 3 months.

## 2012-09-30 ENCOUNTER — Other Ambulatory Visit: Payer: Self-pay | Admitting: Family Medicine

## 2012-10-29 ENCOUNTER — Other Ambulatory Visit (INDEPENDENT_AMBULATORY_CARE_PROVIDER_SITE_OTHER): Payer: 59

## 2012-10-29 DIAGNOSIS — E119 Type 2 diabetes mellitus without complications: Secondary | ICD-10-CM

## 2012-10-29 DIAGNOSIS — E785 Hyperlipidemia, unspecified: Secondary | ICD-10-CM

## 2012-10-29 LAB — LIPID PANEL
HDL: 31.4 mg/dL — ABNORMAL LOW (ref >39.00)
LDL Cholesterol: 69 mg/dL (ref 0–99)
VLDL: 23.4 mg/dL (ref 0.0–40.0)

## 2012-11-05 ENCOUNTER — Ambulatory Visit: Payer: 59 | Admitting: Family Medicine

## 2012-11-08 ENCOUNTER — Encounter: Payer: Self-pay | Admitting: Family Medicine

## 2012-11-08 ENCOUNTER — Ambulatory Visit (INDEPENDENT_AMBULATORY_CARE_PROVIDER_SITE_OTHER): Payer: 59 | Admitting: Family Medicine

## 2012-11-08 VITALS — BP 120/70 | HR 59 | Temp 97.7°F | Ht 71.0 in | Wt 239.0 lb

## 2012-11-08 DIAGNOSIS — I10 Essential (primary) hypertension: Secondary | ICD-10-CM

## 2012-11-08 DIAGNOSIS — IMO0002 Reserved for concepts with insufficient information to code with codable children: Secondary | ICD-10-CM

## 2012-11-08 DIAGNOSIS — E1165 Type 2 diabetes mellitus with hyperglycemia: Secondary | ICD-10-CM

## 2012-11-08 DIAGNOSIS — R32 Unspecified urinary incontinence: Secondary | ICD-10-CM

## 2012-11-08 DIAGNOSIS — J309 Allergic rhinitis, unspecified: Secondary | ICD-10-CM

## 2012-11-08 DIAGNOSIS — E785 Hyperlipidemia, unspecified: Secondary | ICD-10-CM

## 2012-11-08 LAB — POCT URINALYSIS DIPSTICK
Bilirubin, UA: NEGATIVE
Blood, UA: NEGATIVE
Glucose, UA: NEGATIVE
Ketones, UA: NEGATIVE
Leukocytes, UA: NEGATIVE
Nitrite, UA: NEGATIVE
Protein, UA: NEGATIVE
Spec Grav, UA: 1.02
Urobilinogen, UA: NEGATIVE
pH, UA: 6

## 2012-11-08 LAB — PSA: PSA: 1.02 ng/mL (ref 0.10–4.00)

## 2012-11-08 MED ORDER — TAMSULOSIN HCL 0.4 MG PO CAPS: 0.4000 mg | ORAL_CAPSULE | Freq: Every day | ORAL | Status: DC

## 2012-11-08 NOTE — Assessment & Plan Note (Signed)
Much improved control with lifestyle changes.

## 2012-11-08 NOTE — Assessment & Plan Note (Addendum)
UA appears clear.  No red flags .  Will eval PSA as we are overdue.  Most likely due ot overflow incontinence.. Will try trial of flomax.

## 2012-11-08 NOTE — Patient Instructions (Addendum)
Keep working on exercise, weight loss and healthy eating. Trial of flomax for urinary symptoms.  Make sure emptying completely with each urination.

## 2012-11-08 NOTE — Progress Notes (Signed)
  Subjective:    Patient ID: Tommy Sullivan, male    DOB: [birth year], 67 y.o.   MRN: [id number]  HPI  Elevated Cholesterol: LDL at goal , triglycerides now back at goal on crestor 10 mg daily, fenofibrate, fish oil  Lab Results  Component Value Date   CHOL 124 10/29/2012   HDL 31.40* 10/29/2012   LDLCALC 69 10/29/2012   LDLDIRECT 66.8 07/17/2012   TRIG 117.0 10/29/2012   CHOLHDL 4 10/29/2012   Using medications without problems:none  Muscle aches: None  Diet compliance: see above  Exercise: elliptical, walking regularly. Other complaints:  Cost is going up on crestor. He did not tolerate atorvastain in past and it was ineffective.  Diabetes:  Much improved from last check with A1C of 9. Back on track with eating habits. Lab Results  Component Value Date   HGBA1C 7.2* 10/29/2012   Using medications without difficulties:  Hypoglycemic episodes: None  Hyperglycemic episodes: earlier in year  Feet problems:None  Blood Sugars averaging: 87-134 eye exam within last year:yes   Hypertension: At goal <130/80 on ACEI, ARB and bystolic  Using medication without problems or lightheadedness:  Occ lightheaded with bending over. Chest pain with exertion: None  Edema:None  Short of breath: None  Average home BPs: 120/70 Other issues:   Wt Readings from Last 3 Encounters:  11/08/12 239 lb (108.41 kg)  07/23/12 242 lb (109.77 kg)  04/10/11 250 lb 12.8 oz (113.762 kg)   At home weighing 228.  In last 6 weeks he has noted dribbling when he feels urge to go.. ie slight urinary incontinence. Strong flow with urine flow. No dysuria, no hematuria. Moderate frequency given he drinks a lot of fluids.  No nocturia. Empty's completely.      Review of Systems  Constitutional: Negative for fever and fatigue.  HENT: Negative for ear pain.   Eyes: Negative for pain.  Respiratory: Negative for cough and shortness of breath.   Cardiovascular: Negative for chest pain.  Gastrointestinal: Negative  for abdominal pain.  Neurological:       No low back pain.       Objective:   Physical Exam  Constitutional: Vital signs are normal. He appears well-developed and well-nourished.  HENT:  Head: Normocephalic.  Right Ear: Hearing normal.  Left Ear: Hearing normal.  Nose: Nose normal.  Mouth/Throat: Oropharynx is clear and moist and mucous membranes are normal.  Neck: Trachea normal. Carotid bruit is not present. No mass and no thyromegaly present.  Cardiovascular: Normal rate, regular rhythm and normal pulses.  Exam reveals no gallop, no distant heart sounds and no friction rub.   No murmur heard.      No peripheral edema  Pulmonary/Chest: Effort normal and breath sounds normal. No respiratory distress.  Skin: Skin is warm, dry and intact. No rash noted.  Psychiatric: He has a normal mood and affect. His speech is normal and behavior is normal. Thought content normal.     Diabetic foot exam: Normal inspection No skin breakdown No calluses  Normal DP pulses Normal sensation to light touch and monofilament Nails normal      Assessment & Plan:

## 2012-11-08 NOTE — Assessment & Plan Note (Addendum)
LDL an trig at goal on current meds.  Intolerant of atorvastatin and this was ineffective in past.

## 2012-11-08 NOTE — Assessment & Plan Note (Signed)
UA appears clear.  Likely due to BPH

## 2012-11-08 NOTE — Assessment & Plan Note (Signed)
Well controlled. Continue current medication.  

## 2012-11-11 ENCOUNTER — Other Ambulatory Visit: Payer: Self-pay | Admitting: Family Medicine

## 2013-02-20 ENCOUNTER — Other Ambulatory Visit: Payer: 59

## 2013-02-25 ENCOUNTER — Ambulatory Visit: Payer: 59 | Admitting: Family Medicine

## 2013-03-07 ENCOUNTER — Other Ambulatory Visit: Payer: Self-pay | Admitting: *Deleted

## 2013-03-07 MED ORDER — TAMSULOSIN HCL 0.4 MG PO CAPS: 0.4000 mg | ORAL_CAPSULE | Freq: Every day | ORAL | Status: DC

## 2013-04-21 ENCOUNTER — Other Ambulatory Visit: Payer: Self-pay | Admitting: Family Medicine

## 2013-05-01 ENCOUNTER — Other Ambulatory Visit (INDEPENDENT_AMBULATORY_CARE_PROVIDER_SITE_OTHER): Payer: 59

## 2013-05-01 DIAGNOSIS — E1165 Type 2 diabetes mellitus with hyperglycemia: Secondary | ICD-10-CM

## 2013-05-06 ENCOUNTER — Ambulatory Visit (INDEPENDENT_AMBULATORY_CARE_PROVIDER_SITE_OTHER): Payer: 59 | Admitting: Family Medicine

## 2013-05-06 ENCOUNTER — Encounter: Payer: Self-pay | Admitting: Family Medicine

## 2013-05-06 VITALS — BP 128/70 | HR 77 | Temp 97.3°F | Wt 249.0 lb

## 2013-05-06 DIAGNOSIS — E1165 Type 2 diabetes mellitus with hyperglycemia: Secondary | ICD-10-CM

## 2013-05-06 DIAGNOSIS — L723 Sebaceous cyst: Secondary | ICD-10-CM | POA: Insufficient documentation

## 2013-05-06 DIAGNOSIS — H409 Unspecified glaucoma: Secondary | ICD-10-CM

## 2013-05-06 DIAGNOSIS — E785 Hyperlipidemia, unspecified: Secondary | ICD-10-CM

## 2013-05-06 DIAGNOSIS — G4730 Sleep apnea, unspecified: Secondary | ICD-10-CM

## 2013-05-06 DIAGNOSIS — R32 Unspecified urinary incontinence: Secondary | ICD-10-CM

## 2013-05-06 DIAGNOSIS — I10 Essential (primary) hypertension: Secondary | ICD-10-CM

## 2013-05-06 DIAGNOSIS — I2510 Atherosclerotic heart disease of native coronary artery without angina pectoris: Secondary | ICD-10-CM

## 2013-05-06 MED ORDER — GLUCOSE BLOOD VI STRP: ORAL_STRIP | Status: DC

## 2013-05-06 MED ORDER — ROSUVASTATIN CALCIUM 10 MG PO TABS: ORAL_TABLET | ORAL | Status: DC

## 2013-05-06 MED ORDER — DUTASTERIDE 0.5 MG PO CAPS: 0.5000 mg | ORAL_CAPSULE | Freq: Every day | ORAL | Status: DC

## 2013-05-06 MED ORDER — NEBIVOLOL HCL 10 MG PO TABS: 10.0000 mg | ORAL_TABLET | Freq: Every day | ORAL | Status: DC

## 2013-05-06 NOTE — Assessment & Plan Note (Signed)
Poor control with poor lifestyle habits.  Will get back on track. Refused medication. Recheck in 3 months.

## 2013-05-06 NOTE — Progress Notes (Signed)
HPI  67 year old male presents for 6 month follow up  Elevated Cholesterol: LDL at goal , triglycerides  at goal on crestor AT LAST CHECK 6 MONTHS AGO 10 mg daily, fenofibrate, fish oil  Lab Results  Component Value Date   CHOL 124 10/29/2012   HDL 31.40* 10/29/2012   LDLCALC 69 10/29/2012   LDLDIRECT 66.8 07/17/2012   TRIG 117.0 10/29/2012   CHOLHDL 4 10/29/2012   Using medications without problems:none  Muscle aches: None  Diet compliance: see above  Exercise: Not able to exercise like he had been Other complaints:   Cost is going up on crestor. He did not tolerate atorvastain in past and it was ineffective.   Diabetes: Return to worsened control on no medication. He has not been eating well and been traveling a lot. Lab Results  Component Value Date   HGBA1C 9.0* 05/01/2013   Using medications without difficulties:  Hypoglycemic episodes: None  Hyperglycemic episodes: frequently Feet problems:None  Blood Sugars averaging: 140-200 eye exam within last year:yes   Hypertension: At goal <130/80 on ACEI, ARB and bystolic  Using medication without problems or lightheadedness: Occ lightheaded with bending over.  Chest pain with exertion: None  Edema:None  Short of breath: None  Average home BPs: 120/70   Has regained weight Wt Readings from Last 3 Encounters:  05/06/13 249 lb (112.946 kg)  11/08/12 239 lb (108.41 kg)  07/23/12 242 lb (109.77 kg)   Urinary incontinence: Started on flomax at last OV for BPH. He has noted some improvement in urine flow, but it is not back to normal.  He has note sebaceous cyst on his back...  Present for years, has been irritated and mildly painful in last 3 weeks . Discharge with pressure at times. Told in past it would need to be removed.  He desires  Name of surgeon in case he wants removal of entire cyst.  Review of Systems  Constitutional: Negative for fever and fatigue.  HENT: Negative for ear pain.  Eyes: Negative for pain.   Respiratory: Negative for cough and shortness of breath.  Cardiovascular: Negative for chest pain.  Gastrointestinal: Negative for abdominal pain.  Neurological:  No low back pain.  Objective:   Physical Exam  Constitutional: Vital signs are normal. He appears well-developed and well-nourished.  HENT:  Head: Normocephalic.  Right Ear: Hearing normal.  Left Ear: Hearing normal.  Nose: Nose normal.  Mouth/Throat: Oropharynx is clear and moist and mucous membranes are normal.  Neck: Trachea normal. Carotid bruit is not present. No mass and no thyromegaly present.  Cardiovascular: Normal rate, regular rhythm and normal pulses. Exam reveals no gallop, no distant heart sounds and no friction rub.  No murmur heard. No peripheral edema  Pulmonary/Chest: Effort normal and breath sounds normal. No respiratory distress.  Skin: Skin is warm, dry and intact. Sebaceous cyst, central back... No surrounding erythema Psychiatric: He has a normal mood and affect. His speech is normal and behavior is normal. Thought content normal.  Diabetic foot exam:  Normal inspection  No skin breakdown  No calluses  Normal DP pulses  Normal sensation to light touch and monofilament  Nails normal

## 2013-05-06 NOTE — Assessment & Plan Note (Signed)
Well controlled. Continue current medication.  

## 2013-05-06 NOTE — Patient Instructions (Addendum)
Follow up for DM follow up and CPX with labs prior in 3-4 months. Get back on track with lifestyle changes. Add avodart to flomax to help with urinary incontinence. If interested if removal of  cyst ... Try [facility] in [place] for general surgery. Stop at front desk for referral to sleep apnea doctor.

## 2013-05-06 NOTE — Assessment & Plan Note (Signed)
Needs local referral for CPAP management.

## 2013-05-06 NOTE — Assessment & Plan Note (Signed)
Improved but room for more improvement... Add avodart to flomax. Expect 6 months until efffect.

## 2013-05-06 NOTE — Assessment & Plan Note (Signed)
Re-eval in 3 months. On crestor

## 2013-05-06 NOTE — Assessment & Plan Note (Signed)
Asymptomatic. 

## 2013-06-06 ENCOUNTER — Other Ambulatory Visit: Payer: Self-pay | Admitting: *Deleted

## 2013-06-06 MED ORDER — FENOFIBRATE 160 MG PO TABS: 160.0000 mg | ORAL_TABLET | Freq: Every day | ORAL | Status: DC

## 2013-06-06 MED ORDER — TAMSULOSIN HCL 0.4 MG PO CAPS: 0.4000 mg | ORAL_CAPSULE | Freq: Every day | ORAL | Status: DC

## 2013-06-06 NOTE — Telephone Encounter (Signed)
See Drug-Drug Warning with Fenofibrate and Crestor.  Ok to refill?

## 2013-06-13 ENCOUNTER — Encounter: Payer: Self-pay | Admitting: Pulmonary Disease

## 2013-06-13 ENCOUNTER — Ambulatory Visit (INDEPENDENT_AMBULATORY_CARE_PROVIDER_SITE_OTHER): Payer: 59 | Admitting: Pulmonary Disease

## 2013-06-13 ENCOUNTER — Telehealth: Payer: Self-pay | Admitting: Pulmonary Disease

## 2013-06-13 VITALS — BP 114/80 | HR 76 | Temp 98.0°F | Ht 70.5 in | Wt 243.4 lb

## 2013-06-13 DIAGNOSIS — G4710 Hypersomnia, unspecified: Secondary | ICD-10-CM

## 2013-06-13 DIAGNOSIS — Z72821 Inadequate sleep hygiene: Secondary | ICD-10-CM

## 2013-06-13 DIAGNOSIS — G4733 Obstructive sleep apnea (adult) (pediatric): Secondary | ICD-10-CM

## 2013-06-13 DIAGNOSIS — F518 Other sleep disorders not due to a substance or known physiological condition: Secondary | ICD-10-CM

## 2013-06-13 NOTE — Progress Notes (Signed)
Subjective:    Patient ID: Tommy Sullivan, male    DOB: 12/11/1945, 67 y.o.   MRN: [id number]  HPI The pt is a 67 y/o male who I have been asked to see for obstructive sleep apnea.  His workup has been done in [place], and the information is not available to me at this time.  He tells me that his AHI was 16/hr at the time, and was started on cpap for this.  He was also started on provigil for daytime sleepiness, but tells me he never had a workup for daytime hypersomnia.  He has a new machine on unknown pressure, and has kept up with his full face mask cushion changes.  As to bed around 1 AM, and gets up between 645 and 7.  He uses intermittently and rested in the mornings upon arising.  He tells me that he has had persistent daytime sleepiness despite CPAP, and has been taking Provigil for this.  He describes awakening in the early morning hours, taking a Provigil which helps him get back to sleep, and then awakens the next day refreshed.  He has adequate alertness during the day as long as he takes the Provigil.  He has no sleepiness issues with driving.  He tells me that he has had sleepiness dating back to his 75s, but has never had a workup.  The patient's Epworth score today is 7.   Sleep Questionnaire What time do you typically go to bed?( Between what hours) 12a-130a 12a-130a at 1024 on 06/13/13 by Maisie Fus, CMA How long does it take you to fall asleep? instantly  instantly  at 1024 on 06/13/13 by Maisie Fus, CMA How many times during the night do you wake up? 1 1 at 1024 on 06/13/13 by Maisie Fus, CMA What time do you get out of bed to start your day? [id number] 645-7 at 1024 on 06/13/13 by Maisie Fus, CMA Do you drive or operate heavy machinery in your occupation? No No at 1024 on 06/13/13 by Maisie Fus, CMA How much has your weight changed (up or down) over the past two years? (In pounds) 25 lb (11.34 kg) 25 lb (11.34 kg) at 1024 on 06/13/13 by Maisie Fus, CMA Have you ever had a sleep study before? Yes Yes at 1024 on 06/13/13 by Maisie Fus, CMA If yes, location of study? [facility] [place] [employer] [place] at 1024 on 06/13/13 by Maisie Fus, CMA If yes, date of study? 2007 2007 at 1024 on 06/13/13 by Maisie Fus, CMA Do you currently use CPAP? Yes Yes at 1024 on 06/13/13 by Maisie Fus, CMA If so, what pressure? ?? ?? at 1024 on 06/13/13 by Maisie Fus, CMA Do you wear oxygen at any time? No No at 1024 on 06/13/13 by Maisie Fus, CMA   Review of Systems  Constitutional: Negative for fever and unexpected weight change.  HENT: Negative for ear pain, nosebleeds, congestion, sore throat, rhinorrhea, sneezing, trouble swallowing, dental problem, postnasal drip and sinus pressure.   Eyes: Negative for redness and itching.  Respiratory: Negative for cough, chest tightness, shortness of breath and wheezing.   Cardiovascular: Negative for palpitations and leg swelling.  Gastrointestinal: Negative for nausea and vomiting.  Genitourinary: Negative for dysuria.  Musculoskeletal: Negative for joint swelling.  Skin: Negative for rash.  Neurological: Negative for headaches.  Hematological: Does not bruise/bleed easily.  Psychiatric/Behavioral: Negative for dysphoric mood. The patient  is not nervous/anxious.        Objective:   Physical Exam Constitutional:  Overweight male, no acute distress  HENT:  Nares patent without discharge  Oropharynx without exudate, palate and uvula are elongated.  Eyes:  Perrla, eomi, no scleral icterus  Neck:  No JVD, no TMG  Cardiovascular:  Normal rate, regular rhythm, no rubs or gallops.  No murmurs        Intact distal pulses  Pulmonary :  Normal breath sounds, no stridor or respiratory distress   No rales, rhonchi, or wheezing  Abdominal:  Soft, nondistended, bowel sounds present.  No tenderness noted.   Musculoskeletal:  No lower extremity edema  noted.  Lymph Nodes:  No cervical lymphadenopathy noted  Skin:  No cyanosis noted  Neurologic:  Alert, appropriate, moves all 4 extremities without obvious deficit.         Assessment & Plan:

## 2013-06-13 NOTE — Assessment & Plan Note (Signed)
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  I am concerned about him adequately treated sleep apnea, and adequate total sleep time, and then turning around and treating him with a stimulant medicine during the day to make up for this.  I would like to make sure that his CPAP pressure is adequate, and encouraged him to work on his total sleep time.  If he continues to have daytime sleepiness without Provigil, he needs to have a multiple sleep latency test done for evaluation.  The patient is not very happy about this, but I have explained that I would like to evaluate the problem rather than treating the symptom.

## 2013-06-13 NOTE — Assessment & Plan Note (Signed)
The patient has a history of sleep apnea for which he has been on CPAP.  He feels that his machine is working properly, but admits the pressure may not be adequate.  I would like to optimize his pressure again on the automatic setting, and also look at his compliance.  I have also encouraged him to work aggressively on weight loss.

## 2013-06-13 NOTE — Assessment & Plan Note (Signed)
I suspect the patient is not getting adequate sleep time at night, and I have encouraged him to increase this to at least 7 hours or greater a night.  His daytime sleepiness issues that persist may simply be a result of inadequate total sleep time.

## 2013-06-13 NOTE — Patient Instructions (Addendum)
Will optimize your pressure on the auto setting, and call you with results once I receive your download. Work on increasing total sleep time. Work on weight reduction. Will call you once I get the results of your 2 week download, and arrange followup.

## 2013-06-13 NOTE — Telephone Encounter (Signed)
This was filled out during visit with patient and faxed to [facility] [phone]

## 2013-06-23 ENCOUNTER — Telehealth: Payer: Self-pay | Admitting: *Deleted

## 2013-06-23 NOTE — Telephone Encounter (Signed)
Records requested for sleep study have been received and placed in your green folder for review.

## 2013-06-24 ENCOUNTER — Encounter: Payer: Self-pay | Admitting: Pulmonary Disease

## 2013-07-31 ENCOUNTER — Ambulatory Visit (INDEPENDENT_AMBULATORY_CARE_PROVIDER_SITE_OTHER): Payer: 59

## 2013-07-31 DIAGNOSIS — Z23 Encounter for immunization: Secondary | ICD-10-CM

## 2013-09-04 ENCOUNTER — Other Ambulatory Visit: Payer: 59

## 2013-09-06 ENCOUNTER — Other Ambulatory Visit: Payer: Self-pay | Admitting: Pulmonary Disease

## 2013-09-06 DIAGNOSIS — G4733 Obstructive sleep apnea (adult) (pediatric): Secondary | ICD-10-CM

## 2013-09-08 ENCOUNTER — Telehealth: Payer: Self-pay | Admitting: Pulmonary Disease

## 2013-09-08 DIAGNOSIS — G4733 Obstructive sleep apnea (adult) (pediatric): Secondary | ICD-10-CM

## 2013-09-08 NOTE — Telephone Encounter (Signed)
I would like to get a download of his cpap machine for the last 3 weeks to make sure everything is functioning properly.  If so, will consider putting him back on provigil.  Please send order to dme to get this for [place] soon. Thanks.

## 2013-09-08 NOTE — Telephone Encounter (Signed)
Per last ov w/ KC on 9.12.14: Hypersomnia - Barbaraann Share, MD at 06/13/2013 11:16 AM     Status: Written Related Problem: Hypersomnia    The patient has daytime hypersomnia for which he is on Provigil, but it appears he has not had a workup for this prior to starting the medication. I am concerned about him adequately treated sleep apnea, and adequate total sleep time, and then turning around and treating him with a stimulant medicine during the day to make up for this. I would like to make sure that his CPAP pressure is adequate, and encouraged him to work on his total sleep time. If he continues to have daytime sleepiness without Provigil, he needs to have a multiple sleep latency test done for evaluation. The patient is not very happy about this, but I have explained that I would like to evaluate the problem rather than treating the symptom.   Called spoke with patient who reported that he is still having daytime sleepiness despite increasing his sleep time.  Pt stated that he wears his CPAP every night from 6-10hrs, averaging about 7-8 hrs.  Pt does do a lot of driving to [place] but denies falling asleep while driving.  Pt is requesting to restart his Provigil.  Dr Shelle Iron please advise, thank you.

## 2013-09-08 NOTE — Telephone Encounter (Signed)
Called spoke with patient, advised of KC's recs as stated below.  Pt okay with these recommendations and verbalized his understanding.  Pt aware we will call back with KC's recs once all info is obtained.  Order to [facility] requests the download be sent to [facility] ASAP.

## 2013-09-09 ENCOUNTER — Encounter: Payer: 59 | Admitting: Family Medicine

## 2013-09-10 LAB — HM DIABETES EYE EXAM

## 2013-09-23 ENCOUNTER — Telehealth: Payer: Self-pay | Admitting: Pulmonary Disease

## 2013-09-23 NOTE — Telephone Encounter (Signed)
[name] do you have this download? The pt is anxious to hear about this. States that he is tired of waiting. Thanks.

## 2013-09-23 NOTE — Telephone Encounter (Signed)
D/L has been received and placed in [facility] green folder for review.  Please advise. Thanks.

## 2013-09-23 NOTE — Telephone Encounter (Signed)
Please let pt know that his download shows his average usage at only 5 hours and .  I really thing this needs to be 6-7 hours if possible. The download did not show his AHI (how many breakthru apneas that he may be having while wearing cpap).  This is supposed to be on the download, to make sure current pressure is adequate. We need to call [facility] and get this refaxed with this info.   Apologize to pt for the delay, but this is a key piece of information.

## 2013-09-24 MED ORDER — MODAFINIL 200 MG PO TABS: 200.0000 mg | ORAL_TABLET | Freq: Every day | ORAL | Status: DC

## 2013-09-24 NOTE — Telephone Encounter (Signed)
Let him know that provigil is not prescribed 200mg  bid.. This is above the recommended dose of once a day. I do not mind prescribing provigil 200mg  qday for the next one week until we can get appropriate data from dme to see if his sleep apnea is adequately controlled.    #10, no fills. (at qd dosing)

## 2013-09-24 NOTE — Telephone Encounter (Signed)
Called spoke with patient and advised of KC's recs as stated below.  Pt okay with rx for Provigil 200mg  QD and requests this be called into [facility].  Pt also mentioned changing DME companies as he has been having difficulties with [facility] for some time now.  Pt will await DL results and decide on changing after it has been received and reviewed.  Rx telephoned to [facility] to pharmacist Harrold Donath for Provigil 200mg  #10, 1 po QD. Will hold in triage to follow up on DL from [facility] when the office reopens on 12.26.14 (pt aware)

## 2013-09-24 NOTE — Telephone Encounter (Signed)
Spoke with the pt and notified of recs per [facility]  He verbalized understanding  Pt states very frustrated that this has not been handled yet and demands that this be handled today  I again apologized for the delay  Sharyn Creamer and spoke with Vickie to request the download WITH AHI She states that the tech that can provide this is out seeing pt's and will get this faxed over ASAP  Will hold in triage to ensure this is done

## 2013-09-24 NOTE — Telephone Encounter (Signed)
Called pt to give update that we have communicated with [facility] and are awaiting the appropriate info that [facility] needs to be faxed  Pt verbalized understanding  He is asking if we can at least refill his provigil 200 mg bid for him  He states that [facility] had wanted him off of this med for a while and he really wants to get started back on this  [facility], please advise thanks

## 2013-09-26 ENCOUNTER — Encounter: Payer: Self-pay | Admitting: Pulmonary Disease

## 2013-09-26 NOTE — Telephone Encounter (Signed)
Spoke with [place] again and she states that CPAP is year 2012 and model  DS250 and it does NOT have the capabilities of showing AHI. Loaner device that the patient has been given before is model DS560 and it DOES have the capabilities needed to retreive this info. Will send to [facility] as FYI

## 2013-09-26 NOTE — Telephone Encounter (Signed)
Let pt know that I would like to see him in the office to discuss all of this, and see if we can come up with an answer for his daytime sleepiness.  In the meantime, ok to give him enough provigil to last him until his ov.

## 2013-09-26 NOTE — Telephone Encounter (Signed)
lmomtcb x1 

## 2013-09-26 NOTE — Telephone Encounter (Signed)
We need to call [facility] and find out how old his machine is ASAP.  If we can get total usage time, and what pressure he is on from the download, should be able to get AHI.  Need to take care of this now, and let me know.

## 2013-09-26 NOTE — Telephone Encounter (Signed)
ashtyn can you check KC folder to see if the download is there please?  thanks

## 2013-09-26 NOTE — Telephone Encounter (Signed)
Helana from [facility] is requesting an order for a new auto cpap & can be reached at [phone].  Antionette Fairy

## 2013-09-26 NOTE — Telephone Encounter (Signed)
Per [place] at [facility]--unable to get AHI off pts current machine d/t his machine not having the capabilities. Pt will need to use a loaner device to get this specific info. Pt had to use loaner device the last time we did an AUTO D/L. Requesting order for AUTO loaner device for patient to use x 2 weeks to get D/L info needed.  Please advise Dr Shelle Iron. Thanks.

## 2013-09-29 MED ORDER — MODAFINIL 200 MG PO TABS: 200.0000 mg | ORAL_TABLET | Freq: Every day | ORAL | Status: DC

## 2013-09-29 NOTE — Telephone Encounter (Signed)
appt set for 10-27-12 and rx sent to last until OV. Carron Curie, CMA

## 2013-10-27 ENCOUNTER — Encounter: Payer: Self-pay | Admitting: Pulmonary Disease

## 2013-10-27 ENCOUNTER — Ambulatory Visit (INDEPENDENT_AMBULATORY_CARE_PROVIDER_SITE_OTHER): Payer: 59 | Admitting: Pulmonary Disease

## 2013-10-27 VITALS — BP 124/78 | HR 71 | Temp 97.3°F | Ht 70.5 in | Wt 246.8 lb

## 2013-10-27 DIAGNOSIS — G4733 Obstructive sleep apnea (adult) (pediatric): Secondary | ICD-10-CM | POA: Diagnosis not present

## 2013-10-27 DIAGNOSIS — G4710 Hypersomnia, unspecified: Secondary | ICD-10-CM | POA: Diagnosis not present

## 2013-10-27 MED ORDER — MODAFINIL 200 MG PO TABS: 200.0000 mg | ORAL_TABLET | Freq: Every day | ORAL | Status: DC

## 2013-10-27 NOTE — Patient Instructions (Signed)
Will check pressure again with auto for next 2-3 weeks.  Will call once I receive your download results. Work on [employer] loss Will refill your provigil. followup with me again in one year if doing well, but call if issues with machine or pressure.

## 2013-10-27 NOTE — Progress Notes (Signed)
   Subjective:    Patient ID: Tommy Sullivan, male    DOB: 03/01/1946, 67 y.o.   MRN: [id number]  HPI Patient comes in today for followup of his obstructive sleep apnea with persistent daytime sleepiness. He is a new patient to our practice, no stridor establish whether there was something more going on at night or if this was secondary to inadequate sleep hygiene. The other possibility is that he was one of the rare sleep apnea patients who have persistent sleepiness despite adequate treatment of his obstructive events. The patient's pressure has been optimize to 17 cm of water, but the patient tells me this is too high. He continued to have persistent sleepiness until he was restarted on Provigil. He also has a history of periodic limb movements on his sleep study, but denies any symptoms of this. His wife has never commented on kicking during the night.   Review of Systems  Constitutional: Negative for fever and unexpected weight change.  HENT: Negative for congestion, dental problem, ear pain, nosebleeds, postnasal drip, rhinorrhea, sinus pressure, sneezing, sore throat and trouble swallowing.   Eyes: Negative for redness and itching.  Respiratory: Negative for cough, chest tightness, shortness of breath and wheezing.   Cardiovascular: Negative for palpitations and leg swelling.  Gastrointestinal: Negative for nausea and vomiting.  Genitourinary: Negative for dysuria.  Musculoskeletal: Negative for joint swelling.  Skin: Negative for rash.  Neurological: Negative for headaches.  Hematological: Does not bruise/bleed easily.  Psychiatric/Behavioral: Negative for dysphoric mood. The patient is not nervous/anxious.        Objective:   Physical Exam Overweight male in no acute distress Nose without purulence or discharge noted No skin breakdown or pressure necrosis from the CPAP mask Neck without lymphadenopathy or thyromegaly Lower extremities without significant edema, no  cyanosis Alert and oriented, moves all 4 extremities. Does not appear to be sleepy.       Assessment & Plan:

## 2013-10-27 NOTE — Assessment & Plan Note (Signed)
The patient continues to have daytime hypersomnia if he does not stay on Provigil. He has not had an MS LT, but his history is suggestive that he is one of the few sleep apnea patients who have persistent sleepiness despite adequate treatment of his sleep disordered breathing. He did have periodic limb movements on his sleep study, but his bed partner has never complained about kicking, and he has no history of restless leg syndrome. For now, we'll continue the patient on Provigil each morning, but I have stressed to him the importance of good sleep hygiene and CPAP compliance. I suspect all of his issues will resolve if he is able to lose weight.

## 2013-10-27 NOTE — Assessment & Plan Note (Signed)
The patient feels that his current CPAP pressure is too high, and does not think the prior auto titration was accurate. We will go ahead and repeat this, and I've also encouraged him to work aggressively on weight loss.

## 2013-11-04 ENCOUNTER — Other Ambulatory Visit: Payer: Self-pay | Admitting: Family Medicine

## 2013-11-28 ENCOUNTER — Other Ambulatory Visit: Payer: Self-pay | Admitting: Family Medicine

## 2013-12-01 ENCOUNTER — Telehealth: Payer: Self-pay | Admitting: Family Medicine

## 2013-12-01 DIAGNOSIS — I10 Essential (primary) hypertension: Secondary | ICD-10-CM

## 2013-12-01 DIAGNOSIS — E1165 Type 2 diabetes mellitus with hyperglycemia: Secondary | ICD-10-CM

## 2013-12-01 DIAGNOSIS — M109 Gout, unspecified: Secondary | ICD-10-CM

## 2013-12-01 DIAGNOSIS — E785 Hyperlipidemia, unspecified: Secondary | ICD-10-CM

## 2013-12-01 DIAGNOSIS — IMO0002 Reserved for concepts with insufficient information to code with codable children: Secondary | ICD-10-CM

## 2013-12-01 NOTE — Telephone Encounter (Signed)
Message copied by Jinny Sanders on Mon Dec 01, 2013 11:04 PM ------      Message from: Ellamae Sia      Created: Fri Nov 28, 2013 11:15 AM      Regarding: Lab orders for Tuesday, 3.3.15       Patient is scheduled for CPX labs, please order future labs, Thanks , Terri       ------

## 2013-12-01 NOTE — Telephone Encounter (Signed)
Future labs are already in!

## 2013-12-02 ENCOUNTER — Other Ambulatory Visit: Payer: Self-pay | Admitting: Family Medicine

## 2013-12-02 ENCOUNTER — Other Ambulatory Visit: Payer: 59

## 2013-12-02 DIAGNOSIS — IMO0001 Reserved for inherently not codable concepts without codable children: Secondary | ICD-10-CM

## 2013-12-02 DIAGNOSIS — E785 Hyperlipidemia, unspecified: Secondary | ICD-10-CM

## 2013-12-02 DIAGNOSIS — E1165 Type 2 diabetes mellitus with hyperglycemia: Principal | ICD-10-CM

## 2013-12-03 ENCOUNTER — Other Ambulatory Visit (INDEPENDENT_AMBULATORY_CARE_PROVIDER_SITE_OTHER): Payer: 59

## 2013-12-03 DIAGNOSIS — IMO0001 Reserved for inherently not codable concepts without codable children: Secondary | ICD-10-CM

## 2013-12-03 DIAGNOSIS — E1165 Type 2 diabetes mellitus with hyperglycemia: Principal | ICD-10-CM

## 2013-12-03 DIAGNOSIS — E785 Hyperlipidemia, unspecified: Secondary | ICD-10-CM

## 2013-12-03 LAB — COMPREHENSIVE METABOLIC PANEL
ALT: 21 U/L (ref 0–53)
AST: 22 U/L (ref 0–37)
Albumin: 4 g/dL (ref 3.5–5.2)
Alkaline Phosphatase: 24 U/L — ABNORMAL LOW (ref 39–117)
BILIRUBIN TOTAL: 0.6 mg/dL (ref 0.3–1.2)
BUN: 16 mg/dL (ref 6–23)
CALCIUM: 9.6 mg/dL (ref 8.4–10.5)
CHLORIDE: 102 meq/L (ref 96–112)
CO2: 28 mEq/L (ref 19–32)
CREATININE: 1.1 mg/dL (ref 0.4–1.5)
GFR: 72.29 mL/min (ref >60.00)
GLUCOSE: 140 mg/dL — AB (ref 70–99)
Potassium: 4.3 mEq/L (ref 3.5–5.1)
Sodium: 136 mEq/L (ref 135–145)
Total Protein: 6.8 g/dL (ref 6.0–8.3)

## 2013-12-03 LAB — LIPID PANEL
CHOLESTEROL: 121 mg/dL (ref 0–200)
HDL: 32.6 mg/dL — ABNORMAL LOW (ref >39.00)
LDL Cholesterol: 55 mg/dL (ref 0–99)
TRIGLYCERIDES: 166 mg/dL — AB (ref 0.0–149.0)
Total CHOL/HDL Ratio: 4
VLDL: 33.2 mg/dL (ref 0.0–40.0)

## 2013-12-03 LAB — HEMOGLOBIN A1C: HEMOGLOBIN A1C: 7.6 % — AB (ref 4.6–6.5)

## 2013-12-04 ENCOUNTER — Telehealth: Payer: Self-pay | Admitting: Pulmonary Disease

## 2013-12-04 DIAGNOSIS — G4733 Obstructive sleep apnea (adult) (pediatric): Secondary | ICD-10-CM

## 2013-12-04 NOTE — Telephone Encounter (Signed)
Spoke with pt. He is wanting to know if we have received his download from his CPAP machine. The machine that [facility] supplied him with worked a lot better than the machine he currently has.  Per [name]'s request, I am going to send this message to her.

## 2013-12-05 NOTE — Telephone Encounter (Signed)
D/L is in green folder for review per pt request. Please advise. Thanks.

## 2013-12-08 NOTE — Telephone Encounter (Signed)
Pt calling for results pls call @ [phone].Tommy Sullivan

## 2013-12-09 ENCOUNTER — Ambulatory Visit (INDEPENDENT_AMBULATORY_CARE_PROVIDER_SITE_OTHER): Payer: 59 | Admitting: Family Medicine

## 2013-12-09 ENCOUNTER — Encounter: Payer: Self-pay | Admitting: Family Medicine

## 2013-12-09 ENCOUNTER — Telehealth: Payer: Self-pay | Admitting: Family Medicine

## 2013-12-09 VITALS — BP 90/60 | HR 78 | Temp 98.1°F | Ht 70.0 in | Wt 237.2 lb

## 2013-12-09 DIAGNOSIS — Z0000 Encounter for general adult medical examination without abnormal findings: Secondary | ICD-10-CM

## 2013-12-09 DIAGNOSIS — I10 Essential (primary) hypertension: Secondary | ICD-10-CM

## 2013-12-09 DIAGNOSIS — IMO0001 Reserved for inherently not codable concepts without codable children: Secondary | ICD-10-CM

## 2013-12-09 DIAGNOSIS — Z13220 Encounter for screening for lipoid disorders: Secondary | ICD-10-CM

## 2013-12-09 DIAGNOSIS — M109 Gout, unspecified: Secondary | ICD-10-CM

## 2013-12-09 DIAGNOSIS — IMO0002 Reserved for concepts with insufficient information to code with codable children: Secondary | ICD-10-CM

## 2013-12-09 DIAGNOSIS — E785 Hyperlipidemia, unspecified: Secondary | ICD-10-CM

## 2013-12-09 DIAGNOSIS — E1165 Type 2 diabetes mellitus with hyperglycemia: Secondary | ICD-10-CM

## 2013-12-09 LAB — HM DIABETES FOOT EXAM

## 2013-12-09 NOTE — Patient Instructions (Addendum)
Schedule 3 month follow up with labs prior, nonfasting. Schedule colonoscopy later in the year.

## 2013-12-09 NOTE — Telephone Encounter (Signed)
Relevant patient education assigned to patient using Emmi. ° °

## 2013-12-09 NOTE — Progress Notes (Signed)
HPI  I have personally reviewed the Medicare Annual Wellness questionnaire and have noted  1. The patient's medical and social history  2. Their use of alcohol, tobacco or illicit drugs  3. Their current medications and supplements  4. The patient's functional ability including ADL's, fall risks, home safety risks and hearing or visual  impairment.  5. Diet and physical activities  6. Evidence for depression or mood disorders  The patients weight, height, BMI and visual acuity have been recorded in the chart  I have made referrals, counseling and provided education to the patient based review of the above and I have provided the pt with a written personalized care plan for preventive services.   Elevated Cholesterol: LDL at goal , triglycerides betteron crestor 10 mg daily, fenofibrate, fish oil  Lab Results  Component Value Date   CHOL 121 12/03/2013   HDL 32.60* 12/03/2013   LDLCALC 55 12/03/2013   LDLDIRECT 66.8 07/17/2012   TRIG 166.0* 12/03/2013   CHOLHDL 4 12/03/2013  Using medications without problems:none  Muscle aches: None  Diet compliance:  improving Exercise:  Several times a week Other complaints:   Wt Readings from Last 3 Encounters:  12/09/13 237 lb 4 oz (107.616 kg)  10/27/13 246 lb 12.8 oz (111.948 kg)  06/13/13 243 lb 6.4 oz (110.406 kg)     Diabetes:  Lab Results  Component Value Date   HGBA1C 7.6* 12/03/2013  Using medications without difficulties:  Hypoglycemic episodes: None  Hyperglycemic episodes: earlier in year  Feet problems:None  Blood Sugars averaging: 130-140 fbs,  After exercisie 119 eye exam within last year:yes   Hypertension: At goal <130/80 on ACEI, ARB and bystolic  Using medication without problems or lightheadedness:  None Chest pain with exertion: None  Edema:None  Short of breath: None  Average home BPs: BP Readings from Last 3 Encounters:  12/09/13 90/60  10/27/13 124/78  06/13/13 114/80   Plans to set up with  Dr. Rockey Situ for  CAD.  Sleep apnea on CPAP, adjusting. Having too much air in chest, belching in AM. Chest pain .Marland Kitchen Over extended.  Review of Systems  Objective:   Physical Exam  Physical Exam  Constitutional: He is oriented to person, place, and time. He appears well-developed and well-nourished.  HENT:  Right Ear: External ear normal.  Mouth/Throat: Oropharynx is clear and moist. No oropharyngeal exudate.  Eyes: Conjunctivae are normal. Pupils are equal, round, and reactive to light.  Neck: Normal range of motion. Neck supple. No thyromegaly present.  Cardiovascular: Normal rate, regular rhythm and normal heart sounds.  Exam reveals no gallop and no friction rub.   No murmur heard. Respiratory: Effort normal and breath sounds normal. No respiratory distress. He has no wheezes. He has no rales. He exhibits no tenderness.  GI: Soft. Bowel sounds are normal. There is no tenderness.  Genitourinary: Rectum normal, prostate normal and penis normal. No penile tenderness.  Neurological: He is alert and oriented to person, place, and time.  Skin: Skin is warm and dry.  Psychiatric: He has a normal mood and affect. His behavior is normal. Judgment and thought content normal.  Diabetic foot exam: Normal inspection No skin breakdown No calluses  Normal DP pulses Normal sensation to light touch and monofilament Nails normal  Assessment & Plan:   The patient's preventative maintenance and recommended screening tests for an annual wellness exam were reviewed in full today.  Brought up to date unless services declined.  Counselled on the importance of diet,  exercise, and its role in overall health and mortality.  The patient's FH and SH was reviewed, including their home life, tobacco status, and drug and alcohol status.   Due for PSA, but not interested.No prostate cancer in family. Refuses rectal exam this year.  Lab Results  Component Value Date   PSA 1.02 11/08/2012   PSA 0.75 07/13/2010   PSA 1.35  03/29/2009  Vaccines: Uptodate with PNA and Td, Not interested in shingles. He will get prevnar today   Colon:  2010 Dr. Deatra Ina  Repeat due in 5 years.  Remote smoker < 25 years.

## 2013-12-09 NOTE — Progress Notes (Signed)
Pre visit review using our clinic review tool, if applicable. No additional management support is needed unless otherwise documented below in the visit note. 

## 2013-12-09 NOTE — Telephone Encounter (Signed)
Let pt know that optimal pressure appears to be 14-15 on this study.  Will start with 14 and see how he does. Ok to send order to his dme

## 2013-12-09 NOTE — Telephone Encounter (Signed)
Spoke with patient-he is aware of optimal pressure and order placed to [facility]-pt then asked that we have [facility] to ck his machine to make sure its working properly.

## 2013-12-11 ENCOUNTER — Telehealth: Payer: Self-pay

## 2013-12-11 NOTE — Telephone Encounter (Signed)
Relevant patient education assigned to patient using Emmi. ° °

## 2013-12-14 ENCOUNTER — Emergency Department (INDEPENDENT_AMBULATORY_CARE_PROVIDER_SITE_OTHER)
Admission: EM | Admit: 2013-12-14 | Discharge: 2013-12-14 | Disposition: A | Discharge status: Home / Self Care | Payer: 59 | Source: Home / Self Care | Attending: Family Medicine | Admitting: Family Medicine

## 2013-12-14 ENCOUNTER — Emergency Department (INDEPENDENT_AMBULATORY_CARE_PROVIDER_SITE_OTHER): Payer: 59

## 2013-12-14 ENCOUNTER — Encounter (HOSPITAL_COMMUNITY): Payer: Self-pay | Admitting: Emergency Medicine

## 2013-12-14 DIAGNOSIS — J45909 Unspecified asthma, uncomplicated: Secondary | ICD-10-CM | POA: Diagnosis not present

## 2013-12-14 MED ORDER — HYDROCOD POLST-CHLORPHEN POLST 10-8 MG/5ML PO LQCR: 5.0000 mL | Freq: Two times a day (BID) | ORAL | Status: DC | PRN

## 2013-12-14 MED ORDER — ALBUTEROL SULFATE (2.5 MG/3ML) 0.083% IN NEBU
INHALATION_SOLUTION | RESPIRATORY_TRACT | Status: AC
  Filled 2013-12-14: qty 6

## 2013-12-14 MED ORDER — METHYLPREDNISOLONE ACETATE 40 MG/ML IJ SUSP
80.0000 mg | Freq: Once | INTRAMUSCULAR | Status: AC
  Administered 2013-12-14: 80 mg via INTRAMUSCULAR

## 2013-12-14 MED ORDER — IPRATROPIUM BROMIDE 0.06 % NA SOLN: 2.0000 | Freq: Four times a day (QID) | NASAL | Status: DC

## 2013-12-14 MED ORDER — METHYLPREDNISOLONE ACETATE 80 MG/ML IJ SUSP
INTRAMUSCULAR | Status: AC
  Filled 2013-12-14: qty 1

## 2013-12-14 MED ORDER — ALBUTEROL SULFATE (2.5 MG/3ML) 0.083% IN NEBU
5.0000 mg | INHALATION_SOLUTION | Freq: Once | RESPIRATORY_TRACT | Status: AC
Start: 2013-12-14 — End: 2013-12-14
  Administered 2013-12-14: 5 mg via RESPIRATORY_TRACT

## 2013-12-14 MED ORDER — METHYLPREDNISOLONE 4 MG PO KIT: PACK | ORAL | Status: DC

## 2013-12-14 MED ORDER — IPRATROPIUM-ALBUTEROL 0.5-2.5 (3) MG/3ML IN SOLN
RESPIRATORY_TRACT | Status: AC
  Filled 2013-12-14: qty 3

## 2013-12-14 MED ORDER — IPRATROPIUM BROMIDE 0.02 % IN SOLN
0.5000 mg | Freq: Once | RESPIRATORY_TRACT | Status: AC
  Administered 2013-12-14: 0.5 mg via RESPIRATORY_TRACT

## 2013-12-14 MED ORDER — TRIAMCINOLONE ACETONIDE 40 MG/ML IJ SUSP
INTRAMUSCULAR | Status: AC
Start: 2013-12-14 — End: 2013-12-14
  Filled 2013-12-14: qty 1

## 2013-12-14 MED ORDER — TRIAMCINOLONE ACETONIDE 40 MG/ML IJ SUSP
40.0000 mg | Freq: Once | INTRAMUSCULAR | Status: AC
  Administered 2013-12-14: 40 mg via INTRAMUSCULAR

## 2013-12-14 NOTE — ED Notes (Signed)
C/o cold sx  States he has had sx for a week now; runny nose, cough sneezing States last night fever started Antihistamine was given as tx States cough is productive with white mucous

## 2013-12-14 NOTE — ED Provider Notes (Signed)
CSN: [id number]     Arrival date & time 12/14/13  1018 History   First MD Initiated Contact with Patient 12/14/13 1034     Chief Complaint  Patient presents with  . URI   (Consider location/radiation/quality/duration/timing/severity/associated sxs/prior Treatment) Patient is a 68 y.o. male presenting with URI. The history is provided by the patient and the spouse.  URI Presenting symptoms: congestion, cough, fever and rhinorrhea   Severity:  Moderate Onset quality:  Gradual Duration:  2 weeks Progression:  Worsening Chronicity:  New Associated symptoms: wheezing     Past Medical History  Diagnosis Date  . Hyperlipidemia   . Hypertension   . Allergy   . Glaucoma   . Retinal vascular occlusion, unspecified     Right  . Extrinsic asthma, unspecified   . Coronary atherosclerosis of unspecified type of vessel, native or graft   . Gout    Past Surgical History  Procedure Laterality Date  . Tonsillectomy    . Appendectomy  1954  . Neck surgery  1990's    x2   Family History  Problem Relation Age of Onset  . Diabetes Sister   . Diabetes Brother   . Coronary artery disease Brother    History  Substance Use Topics  . Smoking status: Former Smoker -- 1.00 packs/day for 20 years    Types: Cigarettes    Quit date: 10/02/1988  . Smokeless tobacco: Never Used     Comment: quit in the 1990's  . Alcohol Use: 1.5 - 2 oz/week    3-4 drink(s) per week     Comment: 3-4 drinks per week.     Review of Systems  Constitutional: Positive for fever.  HENT: Positive for congestion, postnasal drip and rhinorrhea.   Eyes: Negative.   Respiratory: Positive for cough and wheezing.   Cardiovascular: Negative.   Gastrointestinal: Negative.     Allergies  Review of patient's allergies indicates no known allergies.  Home Medications   Current Outpatient Rx  Name  Route  Sig  Dispense  Refill  . aspirin 81 MG tablet   Oral   Take 81 mg by mouth daily.           .  chlorpheniramine-HYDROcodone (TUSSIONEX PENNKINETIC ER) 10-8 MG/5ML LQCR   Oral   Take 5 mLs by mouth every 12 (twelve) hours as needed for cough.   115 mL   1   . fenofibrate 160 MG tablet   Oral   Take 1 tablet (160 mg total) by mouth daily.   90 tablet   1   . fish oil-omega-3 fatty acids 1000 MG capsule   Oral   Take 2 capsules by mouth 2 (two) times daily.          Marland Kitchen glucose blood (TRUETEST TEST) test strip      Use as instructed to test blood sugar 1-2 times daily dx: 250.02   200 each   3   . ipratropium (ATROVENT) 0.06 % nasal spray   Each Nare   Place 2 sprays into both nostrils 4 (four) times daily.   15 mL   1   . Lancets (FREESTYLE) lancets   Other   1 each by Other route 2 (two) times daily. Use as instructed          . losartan (COZAAR) 100 MG tablet      TAKE ONE TABLET BY MOUTH DAILY   90 tablet   0   . methylPREDNISolone (MEDROL DOSEPAK) 4 MG  tablet      follow package directions, start on mon , take until finished   21 tablet   0   . modafinil (PROVIGIL) 200 MG tablet   Oral   Take 1 tablet (200 mg total) by mouth daily.   90 tablet   3   . nebivolol (BYSTOLIC) 10 MG tablet   Oral   Take 1 tablet (10 mg total) by mouth daily.   90 tablet   3   . rosuvastatin (CRESTOR) 10 MG tablet      TAKE 1 TABLET (10 MG TOTAL) BY MOUTH DAILY.   90 tablet   3   . tamsulosin (FLOMAX) 0.4 MG CAPS capsule      Take 1 capsule (0.4 mg total) by mouth daily.   90 capsule   1   . timolol (BETIMOL) 0.5 % ophthalmic solution   Both Eyes   Place 1 drop into both eyes at bedtime.          BP 114/70  Pulse 87  Temp(Src) 99.9 F (37.7 C) (Oral)  Resp 18  SpO2 98% Physical Exam  Nursing note and vitals reviewed. Constitutional: He is oriented to person, place, and time. He appears well-developed and well-nourished.  HENT:  Head: Normocephalic.  Right Ear: External ear normal.  Left Ear: External ear normal.  Mouth/Throat: Oropharynx is  clear and moist.  Eyes: Pupils are equal, round, and reactive to light.  Neck: Normal range of motion. Neck supple.  Cardiovascular: Regular rhythm and normal heart sounds.   Pulmonary/Chest: No respiratory distress. He has wheezes. He has rales. He exhibits no tenderness.  Lymphadenopathy:    He has no cervical adenopathy.  Neurological: He is alert and oriented to person, place, and time.  Skin: Skin is warm and dry.    ED Course  Procedures (including critical care time) Labs Review Labs Reviewed - No data to display Imaging Review Dg Chest 2 View  12/14/2013   CLINICAL DATA:  One week history of productive cough and now fever for 2 days.  EXAM: CHEST  2 VIEW  COMPARISON:  None.  FINDINGS: The lungs are clear and negative for focal airspace consolidation, pulmonary edema or suspicious pulmonary nodule. Mild central airway thickening. No pleural effusion or pneumothorax. Cardiac and mediastinal contours are within normal limits. No acute fracture or lytic or blastic osseous lesions. The visualized upper abdominal bowel gas pattern is unremarkable.  IMPRESSION: Nonspecific mild central airway thickening as can be seen in both acute and chronic bronchitis.  Otherwise, no acute cardiopulmonary process.   Electronically Signed   By: Jacqulynn Cadet M.D.   On: 12/14/2013 11:16   X-rays reviewed and report per radiologist.   MDM   1. Bronchitis, allergic    Sx improved after neb , mild wheezes cont.    Billy Fischer, MD 12/14/13 [phone]

## 2013-12-17 ENCOUNTER — Other Ambulatory Visit: Payer: Self-pay | Admitting: Family Medicine

## 2013-12-26 ENCOUNTER — Encounter (HOSPITAL_COMMUNITY): Payer: Self-pay | Admitting: Emergency Medicine

## 2013-12-26 ENCOUNTER — Emergency Department (INDEPENDENT_AMBULATORY_CARE_PROVIDER_SITE_OTHER)
Admission: EM | Admit: 2013-12-26 | Discharge: 2013-12-26 | Disposition: A | Discharge status: Nursing Home (Includes ICF) | Payer: 59 | Source: Home / Self Care

## 2013-12-26 DIAGNOSIS — J209 Acute bronchitis, unspecified: Secondary | ICD-10-CM

## 2013-12-26 DIAGNOSIS — R0982 Postnasal drip: Secondary | ICD-10-CM | POA: Diagnosis not present

## 2013-12-26 MED ORDER — BECLOMETHASONE DIPROPIONATE 80 MCG/ACT IN AERS: 1.0000 | INHALATION_SPRAY | Freq: Two times a day (BID) | RESPIRATORY_TRACT | Status: DC

## 2013-12-26 MED ORDER — ALBUTEROL SULFATE HFA 108 (90 BASE) MCG/ACT IN AERS: 2.0000 | INHALATION_SPRAY | RESPIRATORY_TRACT | Status: DC | PRN

## 2013-12-26 NOTE — Discharge Instructions (Signed)
Bronchitis  Bronchitis is inflammation of the airways that extend from the windpipe into the lungs (bronchi). The inflammation often causes mucus to develop, which leads to a cough. If the inflammation becomes severe, it may cause shortness of breath.  CAUSES   Bronchitis may be caused by:   · Viral infections.    · Bacteria.    · Cigarette smoke.    · Allergens, pollutants, and other irritants.    SIGNS AND SYMPTOMS   The most common symptom of bronchitis is a frequent cough that produces mucus. Other symptoms include:  · Fever.    · Body aches.    · Chest congestion.    · Chills.    · Shortness of breath.    · Sore throat.    DIAGNOSIS   Bronchitis is usually diagnosed through a medical history and physical exam. Tests, such as chest X-rays, are sometimes done to rule out other conditions.   TREATMENT   You may need to avoid contact with whatever caused the problem (smoking, for example). Medicines are sometimes needed. These may include:  · Antibiotics. These may be prescribed if the condition is caused by bacteria.  · Cough suppressants. These may be prescribed for relief of cough symptoms.    · Inhaled medicines. These may be prescribed to help open your airways and make it easier for you to breathe.    · Steroid medicines. These may be prescribed for those with recurrent (chronic) bronchitis.  HOME CARE INSTRUCTIONS  · Get plenty of rest.    · Drink enough fluids to keep your urine clear or pale yellow (unless you have a medical condition that requires fluid restriction). Increasing fluids may help thin your secretions and will prevent dehydration.    · Only take over-the-counter or prescription medicines as directed by your health care provider.  · Only take antibiotics as directed. Make sure you finish them even if you start to feel better.  · Avoid secondhand smoke, irritating chemicals, and strong fumes. These will make bronchitis worse. If you are a smoker, quit smoking. Consider using nicotine gum or  skin patches to help control withdrawal symptoms. Quitting smoking will help your lungs heal faster.    · Put a cool-mist humidifier in your bedroom at night to moisten the air. This may help loosen mucus. Change the water in the humidifier daily. You can also run the hot water in your shower and sit in the bathroom with the door closed for 5 10 minutes.    · Follow up with your health care provider as directed.    · Wash your hands frequently to avoid catching bronchitis again or spreading an infection to others.    SEEK MEDICAL CARE IF:  Your symptoms do not improve after 1 week of treatment.   SEEK IMMEDIATE MEDICAL CARE IF:  · Your fever increases.  · You have chills.    · You have chest pain.    · You have worsening shortness of breath.    · You have bloody sputum.  · You faint.    · You have lightheadedness.  · You have a severe headache.    · You vomit repeatedly.  MAKE SURE YOU:   · Understand these instructions.  · Will watch your condition.  · Will get help right away if you are not doing well or get worse.  Document Released: 09/18/2005 Document Revised: 07/09/2013 Document Reviewed: 05/13/2013  ExitCare® Patient Information ©2014 [employer].    Antibiotic Nonuse   Your caregiver felt that the infection or problem was not one that would be helped with an antibiotic.  Infections   may be caused by viruses or bacteria. Only a caregiver can tell which one of these is the likely cause of an illness. A cold is the most common cause of infection in both adults and children. A cold is a virus. Antibiotic treatment will have no effect on a viral infection. Viruses can lead to many lost days of work caring for sick children and many missed days of school. Children may catch as many as 10 "colds" or "flus" per year during which they can be tearful, cranky, and uncomfortable. The goal of treating a virus is aimed at keeping the ill person comfortable.  Antibiotics are medications used to help the body fight  bacterial infections. There are relatively few types of bacteria that cause infections but there are hundreds of viruses. While both viruses and bacteria cause infection they are very different types of germs. A viral infection will typically go away by itself within 7 to 10 days. Bacterial infections may spread or get worse without antibiotic treatment.  Examples of bacterial infections are:  · Sore throats (like strep throat or tonsillitis).  · Infection in the lung (pneumonia).  · Ear and skin infections.  Examples of viral infections are:  · Colds or flus.  · Most coughs and bronchitis.  · Sore throats not caused by Strep.  · Runny noses.  It is often best not to take an antibiotic when a viral infection is the cause of the problem. Antibiotics can kill off the helpful bacteria that we have inside our body and allow harmful bacteria to start growing. Antibiotics can cause side effects such as allergies, nausea, and diarrhea without helping to improve the symptoms of the viral infection. Additionally, repeated uses of antibiotics can cause bacteria inside of our body to become resistant. That resistance can be passed onto harmful bacterial. The next time you have an infection it may be harder to treat if antibiotics are used when they are not needed. Not treating with antibiotics allows our own immune system to develop and take care of infections more efficiently. Also, antibiotics will work better for us when they are prescribed for bacterial infections.  Treatments for a child that is ill may include:  · Give extra fluids throughout the day to stay hydrated.  · Get plenty of rest.  · Only give your child over-the-counter or prescription medicines for pain, discomfort, or fever as directed by your caregiver.  · The use of a cool mist humidifier may help stuffy noses.  · Cold medications if suggested by your caregiver.  Your caregiver may decide to start you on an antibiotic if:  · The problem you were seen for  today continues for a longer length of time than expected.  · You develop a secondary bacterial infection.  SEEK MEDICAL CARE IF:  · Fever lasts longer than 5 days.  · Symptoms continue to get worse after 5 to 7 days or become severe.  · Difficulty in breathing develops.  · Signs of dehydration develop (poor drinking, rare urinating, dark colored urine).  · Changes in behavior or worsening tiredness (listlessness or lethargy).  Document Released: 11/27/2001 Document Revised: 12/11/2011 Document Reviewed: 05/26/2009  ExitCare® Patient Information ©2014 [employer].

## 2013-12-26 NOTE — ED Notes (Addendum)
Pt  Reports  Symptoms  Of  Cough  /  Congested            Tightness  When  He takes  Deep  Breath           Pt  Seen  About 12 days   Ago  Given meds  Helped  For a  While  And  Symptoms  Came  Back

## 2013-12-26 NOTE — ED Provider Notes (Signed)
CSN: [id number]     Arrival date & time 12/26/13  1532 History   First MD Initiated Contact with Patient 12/26/13 1640     Chief Complaint  Patient presents with  . Cough   (Consider location/radiation/quality/duration/timing/severity/associated sxs/prior Treatment) HPI Comments: 68 year old male was seen any urgent care lower week ago for bronchitis with bronchospasm. He is having a cough and upper respiratory and lower respiratory congestion. He was treated with a DuoNeb this significantly helped. Chest x-ray was negative for infection, infiltrates or consolidations. He is given a prescription for Tussionex and prednisone. He presents today with persistent cough and PND. He has also been having some night sweats but no documented fever. He appears generally well today.   Past Medical History  Diagnosis Date  . Hyperlipidemia   . Hypertension   . Allergy   . Glaucoma   . Retinal vascular occlusion, unspecified     Right  . Extrinsic asthma, unspecified   . Coronary atherosclerosis of unspecified type of vessel, native or graft   . Gout    Past Surgical History  Procedure Laterality Date  . Tonsillectomy    . Appendectomy  1954  . Neck surgery  1990's    x2   Family History  Problem Relation Age of Onset  . Diabetes Sister   . Diabetes Brother   . Coronary artery disease Brother    History  Substance Use Topics  . Smoking status: Former Smoker -- 1.00 packs/day for 20 years    Types: Cigarettes    Quit date: 10/02/1988  . Smokeless tobacco: Never Used     Comment: quit in the 1990's  . Alcohol Use: 1.5 - 2 oz/week    3-4 drink(s) per week     Comment: 3-4 drinks per week.     Review of Systems  Constitutional: Positive for fever. Negative for activity change and fatigue.       Fever is subjective not documented he states that he felt warm.  HENT: Positive for congestion, postnasal drip and rhinorrhea.   Respiratory: Positive for cough. Negative for wheezing.    Cardiovascular: Negative.   Gastrointestinal: Negative.   Neurological: Negative.     Allergies  Review of patient's allergies indicates no known allergies.  Home Medications   Current Outpatient Rx  Name  Route  Sig  Dispense  Refill  . albuterol (PROVENTIL HFA;VENTOLIN HFA) 108 (90 BASE) MCG/ACT inhaler   Inhalation   Inhale 2 puffs into the lungs every 4 (four) hours as needed for wheezing or shortness of breath.   1 Inhaler   0   . aspirin 81 MG tablet   Oral   Take 81 mg by mouth daily.           . beclomethasone (QVAR) 80 MCG/ACT inhaler   Inhalation   Inhale 1 puff into the lungs 2 (two) times daily.   1 Inhaler   12   . chlorpheniramine-HYDROcodone (TUSSIONEX PENNKINETIC ER) 10-8 MG/5ML LQCR   Oral   Take 5 mLs by mouth every 12 (twelve) hours as needed for cough.   115 mL   1   . fenofibrate 160 MG tablet      Take 1 tablet (160 mg total) by mouth daily.   90 tablet   1   . fish oil-omega-3 fatty acids 1000 MG capsule   Oral   Take 2 capsules by mouth 2 (two) times daily.          Marland Kitchen glucose blood (  TRUETEST TEST) test strip      Use as instructed to test blood sugar 1-2 times daily dx: 250.02   200 each   3   . ipratropium (ATROVENT) 0.06 % nasal spray   Each Nare   Place 2 sprays into both nostrils 4 (four) times daily.   15 mL   1   . Lancets (FREESTYLE) lancets   Other   1 each by Other route 2 (two) times daily. Use as instructed          . losartan (COZAAR) 100 MG tablet      TAKE ONE TABLET BY MOUTH DAILY   90 tablet   0   . methylPREDNISolone (MEDROL DOSEPAK) 4 MG tablet      follow package directions, start on mon , take until finished   21 tablet   0   . modafinil (PROVIGIL) 200 MG tablet   Oral   Take 1 tablet (200 mg total) by mouth daily.   90 tablet   3   . nebivolol (BYSTOLIC) 10 MG tablet   Oral   Take 1 tablet (10 mg total) by mouth daily.   90 tablet   3   . rosuvastatin (CRESTOR) 10 MG tablet       TAKE 1 TABLET (10 MG TOTAL) BY MOUTH DAILY.   90 tablet   3   . tamsulosin (FLOMAX) 0.4 MG CAPS capsule      Take 1 capsule (0.4 mg total) by mouth daily.   90 capsule   1   . timolol (BETIMOL) 0.5 % ophthalmic solution   Both Eyes   Place 1 drop into both eyes at bedtime.          BP 113/81  Pulse 78  Temp(Src) 98.6 F (37 C) (Oral)  Resp 16  SpO2 98% Physical Exam  Nursing note and vitals reviewed. Constitutional: He is oriented to person, place, and time. He appears well-developed and well-nourished. No distress.  HENT:  Oropharynx with moderate erythema, cobblestoning and clear PND.  Eyes: Conjunctivae and EOM are normal.  Neck: Normal range of motion. Neck supple.  Cardiovascular: Normal rate, regular rhythm and normal heart sounds.   Pulmonary/Chest: Effort normal. No respiratory distress. He has wheezes. He has no rales.  Patient has not proceed wheezes however on auscultation there are wheezes with forced expiration and cough. Slightly prolonged expiratory phase.  Lymphadenopathy:    He has no cervical adenopathy.  Neurological: He is alert and oriented to person, place, and time. He exhibits normal muscle tone.  Skin: Skin is warm and dry.  Psychiatric: He has a normal mood and affect.    ED Course  Procedures (including critical care time) Labs Review Labs Reviewed - No data to display Imaging Review No results found.   MDM   1. Bronchitis with bronchospasm   2. PND (post-nasal drip)     Suspect etiology is allergic Albuterol HFA as directed prn Allegra 180 mg q d q var 80 1 bid Fluids If worse rechk or see PCP    Janne Napoleon, NP 12/26/13 1707

## 2013-12-28 NOTE — ED Provider Notes (Signed)
Medical screening examination/treatment/procedure(s) were performed by a resident physician or non-physician practitioner and as the supervising physician I was immediately available for consultation/collaboration.  Lynne Leader, MD    Gregor Hams, MD 12/28/13 [phone]

## 2014-01-30 ENCOUNTER — Other Ambulatory Visit: Payer: Self-pay | Admitting: Family Medicine

## 2014-03-02 ENCOUNTER — Encounter: Payer: Self-pay | Admitting: Internal Medicine

## 2014-03-02 ENCOUNTER — Ambulatory Visit (INDEPENDENT_AMBULATORY_CARE_PROVIDER_SITE_OTHER): Payer: 59 | Admitting: Internal Medicine

## 2014-03-02 ENCOUNTER — Ambulatory Visit (INDEPENDENT_AMBULATORY_CARE_PROVIDER_SITE_OTHER)
Admission: RE | Admit: 2014-03-02 | Discharge: 2014-03-02 | Disposition: A | Discharge status: Home / Self Care | Payer: 59 | Source: Ambulatory Visit | Attending: Internal Medicine | Admitting: Internal Medicine

## 2014-03-02 VITALS — BP 118/64 | HR 77 | Temp 98.2°F | Wt 235.0 lb

## 2014-03-02 DIAGNOSIS — M25512 Pain in left shoulder: Secondary | ICD-10-CM

## 2014-03-02 DIAGNOSIS — M25519 Pain in unspecified shoulder: Secondary | ICD-10-CM

## 2014-03-02 DIAGNOSIS — M19019 Primary osteoarthritis, unspecified shoulder: Secondary | ICD-10-CM | POA: Diagnosis not present

## 2014-03-02 MED ORDER — MELOXICAM 7.5 MG PO TABS: 7.5000 mg | ORAL_TABLET | Freq: Every day | ORAL | Status: DC

## 2014-03-02 NOTE — Progress Notes (Signed)
Pre visit review using our clinic review tool, if applicable. No additional management support is needed unless otherwise documented below in the visit note. 

## 2014-03-02 NOTE — Patient Instructions (Signed)

## 2014-03-02 NOTE — Progress Notes (Signed)
Subjective:    Patient ID: Tommy Sullivan, male    DOB: 11-25-1945, 67 y.o.   MRN: [id number]  HPI  Pt presents to the clinic today with c/o left shoulder pain. This started 1 month ago. It did occur with an injury. He was lifting and throwing a heavy stone when he heard a "pop". After he heard the pop. 8 days later, he fell on some rocks and landed on his left shoulder. The pain does not radiate. He denies numbness and tingling down the arm.  Review of Systems      Past Medical History  Diagnosis Date  . Hyperlipidemia   . Hypertension   . Allergy   . Glaucoma   . Retinal vascular occlusion, unspecified     Right  . Extrinsic asthma, unspecified   . Coronary atherosclerosis of unspecified type of vessel, native or graft   . Gout     Current Outpatient Prescriptions  Medication Sig Dispense Refill  . aspirin 81 MG tablet Take 81 mg by mouth daily.        . Cholecalciferol (VITAMIN D3) 2000 UNITS TABS Take 1 tablet by mouth daily.      . fenofibrate 160 MG tablet Take 1 tablet (160 mg total) by mouth daily.  90 tablet  1  . fish oil-omega-3 fatty acids 1000 MG capsule Take 2 capsules by mouth 2 (two) times daily.       Marland Kitchen glucose blood (TRUETEST TEST) test strip Use as instructed to test blood sugar 1-2 times daily dx: 250.02  200 each  3  . Lancets (FREESTYLE) lancets 1 each by Other route 2 (two) times daily. Use as instructed       . losartan (COZAAR) 100 MG tablet TAKE ONE TABLET BY MOUTH DAILY  90 tablet  1  . modafinil (PROVIGIL) 200 MG tablet Take 1 tablet (200 mg total) by mouth daily.  90 tablet  3  . nebivolol (BYSTOLIC) 10 MG tablet Take 1 tablet (10 mg total) by mouth daily.  90 tablet  3  . rosuvastatin (CRESTOR) 10 MG tablet TAKE 1 TABLET (10 MG TOTAL) BY MOUTH DAILY.  90 tablet  3  . tamsulosin (FLOMAX) 0.4 MG CAPS capsule Take 1 capsule (0.4 mg total) by mouth daily.  90 capsule  1  . timolol (BETIMOL) 0.5 % ophthalmic solution Place 1 drop into both eyes at  bedtime.       No current facility-administered medications for this visit.    No Known Allergies  Family History  Problem Relation Age of Onset  . Diabetes Sister   . Diabetes Brother   . Coronary artery disease Brother     History   Social History  . Marital Status: Married    Spouse Name: N/A    Number of Children: N/A  . Years of Education: N/A   Occupational History  . retired    Social History Main Topics  . Smoking status: Former Smoker -- 1.00 packs/day for 20 years    Types: Cigarettes    Quit date: 10/02/1988  . Smokeless tobacco: Never Used     Comment: quit in the 1990's  . Alcohol Use: 1.5 - 2.0 oz/week    3-4 drink(s) per week     Comment: 3-4 drinks per week.   . Drug Use: No  . Sexual Activity: Not on file   Other Topics Concern  . Not on file   Social History Narrative  . No narrative on file  Constitutional: Denies fever, malaise, fatigue, headache or abrupt weight changes.  Musculoskeletal: Pt reports left shoulder pain. Denies decrease in range of motion, difficulty with gait, muscle pain or joint  swelling.    No other specific complaints in a complete review of systems (except as listed in HPI above).  Objective:   Physical Exam  BP 118/64  Pulse 77  Temp(Src) 98.2 F (36.8 C) (Oral)  Wt 235 lb (106.595 kg)  SpO2 98% Wt Readings from Last 3 Encounters:  03/02/14 235 lb (106.595 kg)  12/09/13 237 lb 4 oz (107.616 kg)  10/27/13 246 lb 12.8 oz (111.948 kg)    General: Appears his stated age, well developed, well nourished in NAD. Cardiovascular: Normal rate and rhythm. S1,S2 noted.  No murmur, rubs or gallops noted. No JVD or BLE edema. No carotid bruits noted. Pulmonary/Chest: Normal effort and positive vesicular breath sounds. No respiratory distress. No wheezes, rales or ronchi noted.  Musculoskeletal: Normal internal and external rotation of the left shoulder. Strength 4/5 LUE. Strength 5/5 RUE. Positive drop can  test.  BMET    Component Value Date/Time   NA 136 12/03/2013 1024   K 4.3 12/03/2013 1024   CL 102 12/03/2013 1024   CO2 28 12/03/2013 1024   GLUCOSE 140* 12/03/2013 1024   BUN 16 12/03/2013 1024   CREATININE 1.1 12/03/2013 1024   CALCIUM 9.6 12/03/2013 1024   GFRNONAA 78.02 07/13/2010 0919    Lipid Panel     Component Value Date/Time   CHOL 121 12/03/2013 1024   TRIG 166.0* 12/03/2013 1024   HDL 32.60* 12/03/2013 1024   CHOLHDL 4 12/03/2013 1024   VLDL 33.2 12/03/2013 1024   LDLCALC 55 12/03/2013 1024    CBC No results found for this basename: wbc, rbc, hgb, hct, plt, mcv, mch, mchc, rdw, neutrabs, lymphsabs, monoabs, eosabs, basosabs    Hgb A1C Lab Results  Component Value Date   HGBA1C 7.6* 12/03/2013         Assessment & Plan:   Left shoulder pain:  Will check xray of left shoulder today Will also order MRI, concerning for rotator cuff tear eRx for Meloxicam 15 mg daily Avoid heavy lifting until you hear back from me  Will follow up after xray is back

## 2014-03-03 ENCOUNTER — Ambulatory Visit (HOSPITAL_COMMUNITY)
Admission: RE | Admit: 2014-03-03 | Discharge: 2014-03-03 | Disposition: A | Discharge status: Home / Self Care | Payer: 59 | Source: Ambulatory Visit | Attending: Internal Medicine | Admitting: Internal Medicine

## 2014-03-03 ENCOUNTER — Ambulatory Visit: Payer: 59 | Admitting: Family Medicine

## 2014-03-03 DIAGNOSIS — S46909A Unspecified injury of unspecified muscle, fascia and tendon at shoulder and upper arm level, unspecified arm, initial encounter: Secondary | ICD-10-CM | POA: Diagnosis not present

## 2014-03-03 DIAGNOSIS — Y929 Unspecified place or not applicable: Secondary | ICD-10-CM | POA: Insufficient documentation

## 2014-03-03 DIAGNOSIS — M25519 Pain in unspecified shoulder: Secondary | ICD-10-CM | POA: Diagnosis not present

## 2014-03-03 DIAGNOSIS — S4980XA Other specified injuries of shoulder and upper arm, unspecified arm, initial encounter: Secondary | ICD-10-CM | POA: Diagnosis not present

## 2014-03-03 DIAGNOSIS — M19019 Primary osteoarthritis, unspecified shoulder: Secondary | ICD-10-CM | POA: Insufficient documentation

## 2014-03-03 DIAGNOSIS — X58XXXA Exposure to other specified factors, initial encounter: Secondary | ICD-10-CM | POA: Insufficient documentation

## 2014-03-03 DIAGNOSIS — S46819A Strain of other muscles, fascia and tendons at shoulder and upper arm level, unspecified arm, initial encounter: Secondary | ICD-10-CM | POA: Insufficient documentation

## 2014-03-03 DIAGNOSIS — M25512 Pain in left shoulder: Secondary | ICD-10-CM

## 2014-03-04 ENCOUNTER — Other Ambulatory Visit: Payer: Self-pay | Admitting: Internal Medicine

## 2014-03-04 DIAGNOSIS — M25512 Pain in left shoulder: Secondary | ICD-10-CM

## 2014-03-04 DIAGNOSIS — S46219A Strain of muscle, fascia and tendon of other parts of biceps, unspecified arm, initial encounter: Secondary | ICD-10-CM

## 2014-03-13 ENCOUNTER — Other Ambulatory Visit: Payer: Self-pay | Admitting: Orthopedic Surgery

## 2014-03-18 ENCOUNTER — Encounter (HOSPITAL_COMMUNITY): Payer: Self-pay | Admitting: Pharmacy Technician

## 2014-03-19 ENCOUNTER — Encounter (HOSPITAL_COMMUNITY): Payer: Self-pay

## 2014-03-19 ENCOUNTER — Encounter (HOSPITAL_COMMUNITY): Payer: Self-pay | Admitting: *Deleted

## 2014-03-19 ENCOUNTER — Encounter (HOSPITAL_COMMUNITY)
Admission: RE | Admit: 2014-03-19 | Discharge: 2014-03-19 | Disposition: A | Discharge status: Home / Self Care | Payer: 59 | Source: Ambulatory Visit | Attending: Orthopedic Surgery | Admitting: Orthopedic Surgery

## 2014-03-19 DIAGNOSIS — Z7982 Long term (current) use of aspirin: Secondary | ICD-10-CM | POA: Diagnosis not present

## 2014-03-19 DIAGNOSIS — Z01810 Encounter for preprocedural cardiovascular examination: Secondary | ICD-10-CM | POA: Diagnosis not present

## 2014-03-19 DIAGNOSIS — I252 Old myocardial infarction: Secondary | ICD-10-CM | POA: Diagnosis not present

## 2014-03-19 DIAGNOSIS — J45909 Unspecified asthma, uncomplicated: Secondary | ICD-10-CM | POA: Diagnosis not present

## 2014-03-19 DIAGNOSIS — H409 Unspecified glaucoma: Secondary | ICD-10-CM | POA: Diagnosis not present

## 2014-03-19 DIAGNOSIS — Z9861 Coronary angioplasty status: Secondary | ICD-10-CM | POA: Diagnosis not present

## 2014-03-19 DIAGNOSIS — Z87891 Personal history of nicotine dependence: Secondary | ICD-10-CM | POA: Diagnosis not present

## 2014-03-19 DIAGNOSIS — S43429A Sprain of unspecified rotator cuff capsule, initial encounter: Secondary | ICD-10-CM | POA: Diagnosis present

## 2014-03-19 DIAGNOSIS — Z01812 Encounter for preprocedural laboratory examination: Secondary | ICD-10-CM | POA: Diagnosis not present

## 2014-03-19 DIAGNOSIS — I4891 Unspecified atrial fibrillation: Secondary | ICD-10-CM | POA: Diagnosis not present

## 2014-03-19 DIAGNOSIS — E785 Hyperlipidemia, unspecified: Secondary | ICD-10-CM | POA: Diagnosis not present

## 2014-03-19 DIAGNOSIS — Z79899 Other long term (current) drug therapy: Secondary | ICD-10-CM | POA: Diagnosis not present

## 2014-03-19 DIAGNOSIS — I2510 Atherosclerotic heart disease of native coronary artery without angina pectoris: Secondary | ICD-10-CM | POA: Diagnosis not present

## 2014-03-19 DIAGNOSIS — X58XXXA Exposure to other specified factors, initial encounter: Secondary | ICD-10-CM | POA: Diagnosis not present

## 2014-03-19 DIAGNOSIS — I10 Essential (primary) hypertension: Secondary | ICD-10-CM | POA: Diagnosis not present

## 2014-03-19 DIAGNOSIS — G4730 Sleep apnea, unspecified: Secondary | ICD-10-CM | POA: Diagnosis not present

## 2014-03-19 DIAGNOSIS — E119 Type 2 diabetes mellitus without complications: Secondary | ICD-10-CM | POA: Diagnosis not present

## 2014-03-19 HISTORY — DX: Unspecified osteoarthritis, unspecified site: M19.90

## 2014-03-19 HISTORY — DX: Type 2 diabetes mellitus without complications: E11.9

## 2014-03-19 LAB — BASIC METABOLIC PANEL
BUN: 18 mg/dL (ref 6–23)
CHLORIDE: 97 meq/L (ref 96–112)
CO2: 26 mEq/L (ref 19–32)
Calcium: 10.3 mg/dL (ref 8.4–10.5)
Creatinine, Ser: 0.93 mg/dL (ref 0.50–1.35)
GFR calc Af Amer: >90 mL/min (ref >90)
GFR, EST NON AFRICAN AMERICAN: 84 mL/min — AB (ref >90)
GLUCOSE: 182 mg/dL — AB (ref 70–99)
POTASSIUM: 4.7 meq/L (ref 3.7–5.3)
Sodium: 136 mEq/L — ABNORMAL LOW (ref 137–147)

## 2014-03-19 LAB — CBC
HEMATOCRIT: 41.6 % (ref 39.0–52.0)
HEMOGLOBIN: 14.3 g/dL (ref 13.0–17.0)
MCH: 31.3 pg (ref 26.0–34.0)
MCHC: 34.4 g/dL (ref 30.0–36.0)
MCV: 91 fL (ref 78.0–100.0)
Platelets: 165 10*3/uL (ref 150–400)
RBC: 4.57 MIL/uL (ref 4.22–5.81)
RDW: 13.5 % (ref 11.5–15.5)
WBC: 7.8 10*3/uL (ref 4.0–10.5)

## 2014-03-19 NOTE — Progress Notes (Signed)
EKG shows Atrial Flutter, patient does not have a cardiologist currently.  Tommy Sullivan reported that he does not think he has had an EKG since 2009 when he had a angioplasty with stent.  Patient denies chest pain, SOB, dizziness.  I notified Dr Ermalene Postin of the AFlutter and he instructed me to tell patient to go be evaluated at his medical doctor.  I notified patient and he voiced understanding.

## 2014-03-19 NOTE — Pre-Procedure Instructions (Signed)
Tommy Sullivan  03/19/2014   Your procedure is scheduled on:  Thursday, July 2.  Report to [facility] Admitting at 8:30 AM.  Call this number if you have problems the morning of surgery: [phone]   Remember:   Do not eat food or drink liquids after midnight Wednesday, July 1.   Take these medicines the morning of surgery with A SIP OF WATER: nebivolol (BYSTOLIC), tamsulosin (FLOMAX).              Stop taking Aspirin, Aleve (Naproxen), Fish Oil and Vitamins.    Do not wear jewelry, make-up or nail polish.  Do not wear lotions, powders, or perfumes.    Men may shave face and neck.  Do not bring valuables to the hospital.              [facility] is not responsible  for any belongings or valuables.               Contacts, dentures or bridgework may not be worn into surgery.  Leave suitcase in the car. After surgery it may be brought to your room.  For patients admitted to the hospital, discharge time is determined by your treatment team.               Patients discharged the day of surgery will not be allowed to drive home.  Name and phone number of your driver: -   Special Instructions: Review  [facility] - Preparing For Surgery.   Please read over the following fact sheets that you were given: Pain Booklet, Coughing and Deep Breathing and Surgical Site Infection Prevention

## 2014-03-20 ENCOUNTER — Encounter: Payer: Self-pay | Admitting: Family Medicine

## 2014-03-20 ENCOUNTER — Ambulatory Visit (INDEPENDENT_AMBULATORY_CARE_PROVIDER_SITE_OTHER): Payer: 59 | Admitting: Family Medicine

## 2014-03-20 VITALS — BP 118/70 | HR 85 | Temp 98.2°F | Wt 241.0 lb

## 2014-03-20 DIAGNOSIS — E1165 Type 2 diabetes mellitus with hyperglycemia: Secondary | ICD-10-CM

## 2014-03-20 DIAGNOSIS — IMO0002 Reserved for concepts with insufficient information to code with codable children: Secondary | ICD-10-CM

## 2014-03-20 DIAGNOSIS — Z01818 Encounter for other preprocedural examination: Secondary | ICD-10-CM

## 2014-03-20 DIAGNOSIS — R9431 Abnormal electrocardiogram [ECG] [EKG]: Secondary | ICD-10-CM

## 2014-03-20 DIAGNOSIS — I10 Essential (primary) hypertension: Secondary | ICD-10-CM

## 2014-03-20 DIAGNOSIS — I2510 Atherosclerotic heart disease of native coronary artery without angina pectoris: Secondary | ICD-10-CM

## 2014-03-20 DIAGNOSIS — IMO0001 Reserved for inherently not codable concepts without codable children: Secondary | ICD-10-CM

## 2014-03-20 NOTE — Assessment & Plan Note (Signed)
Well controlled. Continue current medication.  

## 2014-03-20 NOTE — Assessment & Plan Note (Signed)
Need cardiology referral prior to surgery given abnormal EKG and hx of stent placement.  Reviewed cardiology records in detail.

## 2014-03-20 NOTE — Progress Notes (Signed)
Pre visit review using our clinic review tool, if applicable. No additional management support is needed unless otherwise documented below in the visit note. 

## 2014-03-20 NOTE — Patient Instructions (Signed)
Stop at front desk to speak for referral. Work on weight loss, low carb diet, exercise as tolerated.  Change DM follow up to 3 months from now with fasting labs.

## 2014-03-20 NOTE — Progress Notes (Signed)
   Subjective:    Patient ID: Tommy Sullivan, male    DOB: 1946-08-30, 68 y.o.   MRN: [id number]  HPI  68 year old male with CAD, DM, gout, HTN, high chol presents following abnormal EKG for pre-op clearance.  Given his CAD hx he needs to be cleared by cards prior to surgery. Stent placed in RCA in 2009 following MI.  Upcoming left shoulder surgery 7/2.  EKG showed: > aflutter rate 74, ST changes concerning for lateral ischemia, ? Past inferior and anterior infarct No previous for comparison.  Cards records are in media tab.  DM, poor control  Few months ago. Was on steroids for bronchitis in 12/2013. Was occ up at 350  FBS 95-172 Lab Results  Component Value Date   HGBA1C 7.6* 12/03/2013    No chest pain, no tachycardia, no sweating, good exercise tolerance.    Review of Systems  Constitutional: Negative for fever and fatigue.  HENT: Negative for ear pain.   Eyes: Negative for pain.  Respiratory: Negative for cough, shortness of breath and wheezing.   Cardiovascular: Negative for chest pain, palpitations and leg swelling.  Gastrointestinal: Negative for abdominal pain.       Objective   Physical Exam  Constitutional: Vital signs are normal. He appears well-developed and well-nourished.  HENT:  Head: Normocephalic.  Right Ear: Hearing normal.  Left Ear: Hearing normal.  Nose: Nose normal.  Mouth/Throat: Oropharynx is clear and moist and mucous membranes are normal.  Neck: Trachea normal. Carotid bruit is not present. No mass and no thyromegaly present.  Cardiovascular: Normal rate, regular rhythm and normal pulses.  Exam reveals no gallop, no distant heart sounds and no friction rub.   No murmur heard. No peripheral edema  Pulmonary/Chest: Effort normal and breath sounds normal. No respiratory distress.  Skin: Skin is warm, dry and intact. No rash noted.  Psychiatric: He has a normal mood and affect. His speech is normal and behavior is normal. Thought content normal.           Assessment & Plan:

## 2014-03-20 NOTE — Assessment & Plan Note (Signed)
No previous for comparison except EKG prior to past MI.

## 2014-03-20 NOTE — Assessment & Plan Note (Signed)
Improving control. Follow up in 3 months.

## 2014-03-23 NOTE — Progress Notes (Signed)
Dr. Bettina Gavia office notified that pt. Will need cardiac clearance prior to surgery.

## 2014-03-24 ENCOUNTER — Ambulatory Visit: Payer: 59 | Admitting: Nurse Practitioner

## 2014-03-26 ENCOUNTER — Encounter: Payer: Self-pay | Admitting: Cardiovascular Disease

## 2014-03-26 ENCOUNTER — Ambulatory Visit (INDEPENDENT_AMBULATORY_CARE_PROVIDER_SITE_OTHER): Payer: 59 | Admitting: Cardiovascular Disease

## 2014-03-26 VITALS — BP 120/80 | HR 83 | Ht 70.5 in | Wt 245.0 lb

## 2014-03-26 DIAGNOSIS — Z01810 Encounter for preprocedural cardiovascular examination: Secondary | ICD-10-CM

## 2014-03-26 DIAGNOSIS — I2510 Atherosclerotic heart disease of native coronary artery without angina pectoris: Secondary | ICD-10-CM

## 2014-03-26 DIAGNOSIS — E785 Hyperlipidemia, unspecified: Secondary | ICD-10-CM

## 2014-03-26 DIAGNOSIS — I10 Essential (primary) hypertension: Secondary | ICD-10-CM

## 2014-03-26 DIAGNOSIS — Z01818 Encounter for other preprocedural examination: Secondary | ICD-10-CM | POA: Diagnosis not present

## 2014-03-26 DIAGNOSIS — I484 Atypical atrial flutter: Secondary | ICD-10-CM

## 2014-03-26 DIAGNOSIS — R9431 Abnormal electrocardiogram [ECG] [EKG]: Secondary | ICD-10-CM

## 2014-03-26 DIAGNOSIS — I483 Typical atrial flutter: Secondary | ICD-10-CM

## 2014-03-26 DIAGNOSIS — I4892 Unspecified atrial flutter: Secondary | ICD-10-CM

## 2014-03-26 NOTE — Patient Instructions (Addendum)
[place]  Your caregiver has ordered a Stress Test with nuclear imaging. The purpose of this test is to evaluate the blood supply to your heart muscle. This procedure is referred to as a "Non-Invasive Stress Test." This is because other than having an IV started in your vein, nothing is inserted or "invades" your body. Cardiac stress tests are done to find areas of poor blood flow to the heart by determining the extent of coronary artery disease (CAD). Some patients exercise on a treadmill, which naturally increases the blood flow to your heart, while others who are  unable to walk on a treadmill due to physical limitations have a pharmacologic/chemical stress agent called Lexiscan . This medicine will mimic walking on a treadmill by temporarily increasing your coronary blood flow.   Please note: these test may take anywhere between 2-4 hours to complete  PLEASE REPORT TO [facility] MEDICAL MALL ENTRANCE  THE VOLUNTEERS AT THE FIRST DESK WILL DIRECT YOU WHERE TO GO  Date of Procedure:________June 29,2015 Arrival Time for Procedure:____7:15 am  Instructions regarding medication:   ____ : Hold diabetes medication morning of procedure  ____:  Hold betablocker(s) night before procedure and morning of procedure(Do not take your Bystolic the morning of your test)      PLEASE NOTIFY THE OFFICE AT LEAST 24 HOURS IN ADVANCE IF YOU ARE UNABLE TO KEEP YOUR APPOINTMENT.  [phone] AND  PLEASE NOTIFY NUCLEAR MEDICINE AT [facility] AT LEAST 24 HOURS IN ADVANCE IF YOU ARE UNABLE TO KEEP YOUR APPOINTMENT. [phone]  How to prepare for your Myoview test:  1. Do not eat or drink after midnight 2. No caffeine for 24 hours prior to test 3. No smoking 24 hours prior to test. 4. Your medication may be taken with water.  If your doctor stopped a medication because of this test, do not take that medication. 5. Ladies, please do not wear dresses.  Skirts or pants are appropriate. Please wear a short sleeve  shirt. 6. No perfume, cologne or lotion. 7. Wear comfortable walking shoes. No heels!  Your physician has requested that you have an echocardiogram. Echocardiography is a painless test that uses sound waves to create images of your heart. It provides your doctor with information about the size and shape of your heart and how well your heart's chambers and valves are working. This procedure takes approximately one hour. There are no restrictions for this procedure.  You have been referred to Dr. Caryl Comes for atrial flutter Your physician recommends that you schedule a follow-up appointment in: 6 months with Dr. Fletcher Anon

## 2014-03-26 NOTE — Progress Notes (Signed)
Primary care physician: Dr. Diona Browner  HPI  This is a pleasant 68 year old male who was referred for preoperative cardiovascular evaluation before shoulder surgery. He has known history of coronary artery disease with previous myocardial infarction  in 2009 with PCI done by Dr. Collene Mares and no cardiac events since then. He also reports unspecified tachyarrhythmia. He has other chronic medical conditions that include type 2 diabetes, hypertension and hyperlipidemia.  He had a routine EKG done recently which showed atrial flutter. The patient is not aware of previous history of atrial fibrillation or flutter or though he did have some form of tachyarrhythmia. He denies chest pain, palpitations or dizziness. He has chronic exertional dyspnea. He tries to stay active and goes to the gym for exercise. He quit smoking 20 years ago. He has no previous history of stroke or congestive heart failure.    No Known Allergies   Current Outpatient Prescriptions on File Prior to Visit  Medication Sig Dispense Refill  . aspirin 81 MG tablet Take 81 mg by mouth daily.        . Cholecalciferol (VITAMIN D3) 2000 UNITS TABS Take 2,000 Units by mouth daily.       . fenofibrate 160 MG tablet Take 160 mg by mouth daily.      . fish oil-omega-3 fatty acids 1000 MG capsule Take 2 g by mouth 2 (two) times daily.       Marland Kitchen glucose blood (TRUETEST TEST) test strip Use as instructed to test blood sugar 1-2 times daily dx: 250.02  200 each  3  . Lancets (FREESTYLE) lancets 1 each by Other route 2 (two) times daily. Use as instructed       . losartan (COZAAR) 100 MG tablet Take 100 mg by mouth daily.      . modafinil (PROVIGIL) 200 MG tablet Take 1 tablet (200 mg total) by mouth daily.  90 tablet  3  . naproxen sodium (ANAPROX) 220 MG tablet Take 220 mg by mouth 2 (two) times daily as needed (pain).      . nebivolol (BYSTOLIC) 10 MG tablet Take 1 tablet (10 mg total) by mouth daily.  90 tablet  3  . rosuvastatin (CRESTOR) 10 MG  tablet Take 10 mg by mouth at bedtime.      . tamsulosin (FLOMAX) 0.4 MG CAPS capsule Take 0.4 mg by mouth daily.      . timolol (BETIMOL) 0.5 % ophthalmic solution Place 1 drop into both eyes at bedtime.       No current facility-administered medications on file prior to visit.     Past Medical History  Diagnosis Date  . Hyperlipidemia   . Hypertension   . Allergy   . Glaucoma   . Retinal vascular occlusion, unspecified     Right  . Extrinsic asthma, unspecified   . Coronary atherosclerosis of unspecified type of vessel, native or graft   . Gout   . Myocardial infarction 1/09  . Shortness of breath   . Diabetes mellitus without complication   . Arthritis   . Dysrhythmia     "hx HR speeding up at times"  . Heart murmur      Past Surgical History  Procedure Laterality Date  . Tonsillectomy    . Appendectomy  1954  . Neck surgery  1990's    x2, Fusion  . Colonoscopy    . Coronary angioplasty  2009    stent [place]     Family History  Problem  Relation Age of Onset  . Diabetes Sister   . Diabetes Brother   . Coronary artery disease Brother   . AAA (abdominal aortic aneurysm) Father      History   Social History  . Marital Status: Married    Spouse Name: N/A    Number of Children: N/A  . Years of Education: N/A   Occupational History  . retired    Social History Main Topics  . Smoking status: Former Smoker -- 1.00 packs/day for 20 years    Types: Cigarettes    Quit date: 10/02/1988  . Smokeless tobacco: Never Used     Comment: quit in the 1990's  . Alcohol Use: 1.8 oz/week    3 Cans of beer per week     Comment: 3-4 drinks per week.   . Drug Use: No  . Sexual Activity: Not on file   Other Topics Concern  . Not on file   Social History Narrative  . No narrative on file     ROS A 10 point review of system was performed. It is negative other than that mentioned in the history of present illness.   PHYSICAL EXAM   BP 120/80  Ht  5' 10.5" (1.791 m)  Wt 245 lb (111.131 kg)  BMI 34.65 kg/m2 Constitutional: He is oriented to person, place, and time. He appears well-developed and well-nourished. No distress.  HENT: No nasal discharge.  Head: Normocephalic and atraumatic.  Eyes: Pupils are equal and round.  No discharge. Neck: Normal range of motion. Neck supple. No JVD present. No thyromegaly present.  Cardiovascular: Normal rate, regular rhythm, normal heart sounds. Exam reveals no gallop and no friction rub. No murmur heard.  Pulmonary/Chest: Effort normal and breath sounds normal. No stridor. No respiratory distress. He has no wheezes. He has no rales. He exhibits no tenderness.  Abdominal: Soft. Bowel sounds are normal. He exhibits no distension. There is no tenderness. There is no rebound and no guarding.  Musculoskeletal: Normal range of motion. He exhibits no edema and no tenderness.  Neurological: He is alert and oriented to person, place, and time. Coordination normal.  Skin: Skin is warm and dry. No rash noted. He is not diaphoretic. No erythema. No pallor.  Psychiatric: He has a normal mood and affect. His behavior is normal. Judgment and thought content normal.       EKG: Atrial flutter with variable AV block. Ventricular rate is 83 beats per minute. 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

## 2014-03-27 ENCOUNTER — Encounter: Payer: Self-pay | Admitting: Cardiovascular Disease

## 2014-03-27 ENCOUNTER — Ambulatory Visit: Payer: 59 | Admitting: Cardiovascular Disease

## 2014-03-27 DIAGNOSIS — I4892 Unspecified atrial flutter: Secondary | ICD-10-CM | POA: Insufficient documentation

## 2014-03-27 DIAGNOSIS — Z01810 Encounter for preprocedural cardiovascular examination: Secondary | ICD-10-CM | POA: Insufficient documentation

## 2014-03-27 NOTE — Assessment & Plan Note (Signed)
The patient has known history of coronary artery disease with previous myocardial infarction and stenting in 2009. Overall functional capacity is good. However, his EKG is abnormal with no recent ischemic cardiac evaluation. Thus, I recommend a pharmacologic nuclear stress test. Not able to do a treadmill stress test given the presence of atrial flutter.

## 2014-03-27 NOTE — Assessment & Plan Note (Signed)
Lab Results  Component Value Date   CHOL 121 12/03/2013   HDL 32.60* 12/03/2013   LDLCALC 55 12/03/2013   LDLDIRECT 66.8 07/17/2012   TRIG 166.0* 12/03/2013   CHOLHDL 4 12/03/2013   He is currently on Crestor and fenofibrate with good control.

## 2014-03-27 NOTE — Assessment & Plan Note (Signed)
No previous symptoms of angina. However, he has chronic exertional dyspnea and an abnormal EKG. The stress test was ordered as outlined above.

## 2014-03-27 NOTE — Assessment & Plan Note (Signed)
Blood pressure is well controlled on current medications. 

## 2014-03-27 NOTE — Assessment & Plan Note (Signed)
The patient is in typical atrial flutter with controlled ventricular rate. He seems to be asymptomatic from this. CHADS2 VASc score is 4. Thus, he is at moderate to high risk for thromboembolic complications. I did not initiate anticoagulation given that he is going to undergo surgery next week. This will be addressed after surgery. I requested an echocardiogram to evaluate atrial size. I am also referring him to see Dr. Caryl Comes to consider catheter ablation in order to avoid the need for long-term anticoagulation.

## 2014-03-30 ENCOUNTER — Ambulatory Visit: Payer: Self-pay | Admitting: Cardiovascular Disease

## 2014-03-30 ENCOUNTER — Other Ambulatory Visit: Payer: Self-pay | Admitting: Orthopedic Surgery

## 2014-03-30 DIAGNOSIS — Z01810 Encounter for preprocedural cardiovascular examination: Secondary | ICD-10-CM

## 2014-03-30 DIAGNOSIS — R9431 Abnormal electrocardiogram [ECG] [EKG]: Secondary | ICD-10-CM | POA: Diagnosis not present

## 2014-03-30 DIAGNOSIS — I4892 Unspecified atrial flutter: Secondary | ICD-10-CM | POA: Diagnosis not present

## 2014-03-30 NOTE — Telephone Encounter (Signed)
This encounter was created in error - please disregard.

## 2014-03-31 ENCOUNTER — Other Ambulatory Visit: Payer: Self-pay

## 2014-03-31 DIAGNOSIS — R9431 Abnormal electrocardiogram [ECG] [EKG]: Secondary | ICD-10-CM

## 2014-04-02 ENCOUNTER — Ambulatory Visit (HOSPITAL_COMMUNITY)
Admission: RE | Admit: 2014-04-02 | Discharge: 2014-04-02 | Disposition: A | Discharge status: Home / Self Care | Payer: 59 | Source: Ambulatory Visit | Attending: Orthopedic Surgery | Admitting: Orthopedic Surgery

## 2014-04-02 ENCOUNTER — Encounter (HOSPITAL_BASED_OUTPATIENT_CLINIC_OR_DEPARTMENT_OTHER): Payer: 59 | Admitting: Vascular Surgery

## 2014-04-02 ENCOUNTER — Encounter (HOSPITAL_BASED_OUTPATIENT_CLINIC_OR_DEPARTMENT_OTHER): Payer: Self-pay | Admitting: *Deleted

## 2014-04-02 ENCOUNTER — Encounter: Payer: Self-pay | Admitting: *Deleted

## 2014-04-02 ENCOUNTER — Telehealth: Payer: Self-pay | Admitting: *Deleted

## 2014-04-02 ENCOUNTER — Encounter (HOSPITAL_BASED_OUTPATIENT_CLINIC_OR_DEPARTMENT_OTHER)
Admission: RE | Disposition: A | Discharge status: Home / Self Care | Payer: Self-pay | Source: Ambulatory Visit | Attending: Orthopedic Surgery

## 2014-04-02 ENCOUNTER — Ambulatory Visit: Payer: 59 | Admitting: Cardiovascular Disease

## 2014-04-02 ENCOUNTER — Ambulatory Visit (HOSPITAL_BASED_OUTPATIENT_CLINIC_OR_DEPARTMENT_OTHER): Payer: 59 | Admitting: Anesthesiology

## 2014-04-02 DIAGNOSIS — I10 Essential (primary) hypertension: Secondary | ICD-10-CM | POA: Insufficient documentation

## 2014-04-02 DIAGNOSIS — E119 Type 2 diabetes mellitus without complications: Secondary | ICD-10-CM | POA: Insufficient documentation

## 2014-04-02 DIAGNOSIS — E785 Hyperlipidemia, unspecified: Secondary | ICD-10-CM | POA: Insufficient documentation

## 2014-04-02 DIAGNOSIS — M25519 Pain in unspecified shoulder: Secondary | ICD-10-CM | POA: Diagnosis not present

## 2014-04-02 DIAGNOSIS — Z87891 Personal history of nicotine dependence: Secondary | ICD-10-CM | POA: Insufficient documentation

## 2014-04-02 DIAGNOSIS — G8918 Other acute postprocedural pain: Secondary | ICD-10-CM | POA: Diagnosis not present

## 2014-04-02 DIAGNOSIS — Z7982 Long term (current) use of aspirin: Secondary | ICD-10-CM | POA: Insufficient documentation

## 2014-04-02 DIAGNOSIS — S43429A Sprain of unspecified rotator cuff capsule, initial encounter: Secondary | ICD-10-CM | POA: Diagnosis not present

## 2014-04-02 DIAGNOSIS — M75102 Unspecified rotator cuff tear or rupture of left shoulder, not specified as traumatic: Secondary | ICD-10-CM

## 2014-04-02 DIAGNOSIS — X58XXXA Exposure to other specified factors, initial encounter: Secondary | ICD-10-CM | POA: Insufficient documentation

## 2014-04-02 DIAGNOSIS — I2510 Atherosclerotic heart disease of native coronary artery without angina pectoris: Secondary | ICD-10-CM | POA: Insufficient documentation

## 2014-04-02 DIAGNOSIS — Z9861 Coronary angioplasty status: Secondary | ICD-10-CM | POA: Insufficient documentation

## 2014-04-02 DIAGNOSIS — J45909 Unspecified asthma, uncomplicated: Secondary | ICD-10-CM | POA: Insufficient documentation

## 2014-04-02 DIAGNOSIS — I4891 Unspecified atrial fibrillation: Secondary | ICD-10-CM | POA: Insufficient documentation

## 2014-04-02 DIAGNOSIS — I252 Old myocardial infarction: Secondary | ICD-10-CM | POA: Insufficient documentation

## 2014-04-02 DIAGNOSIS — H409 Unspecified glaucoma: Secondary | ICD-10-CM | POA: Insufficient documentation

## 2014-04-02 DIAGNOSIS — Z79899 Other long term (current) drug therapy: Secondary | ICD-10-CM | POA: Insufficient documentation

## 2014-04-02 DIAGNOSIS — G4730 Sleep apnea, unspecified: Secondary | ICD-10-CM | POA: Insufficient documentation

## 2014-04-02 DIAGNOSIS — M25819 Other specified joint disorders, unspecified shoulder: Secondary | ICD-10-CM | POA: Diagnosis not present

## 2014-04-02 HISTORY — PX: SHOULDER ARTHROSCOPY WITH SUBACROMIAL DECOMPRESSION: SHX5684

## 2014-04-02 LAB — GLUCOSE, CAPILLARY: Glucose-Capillary: 180 mg/dL — ABNORMAL HIGH (ref 70–99)

## 2014-04-02 SURGERY — SHOULDER ARTHROSCOPY WITH SUBACROMIAL DECOMPRESSION
Anesthesia: Regional | Site: Shoulder | Laterality: Left

## 2014-04-02 SURGERY — ARTHROSCOPY, SHOULDER, WITH ROTATOR CUFF REPAIR
Anesthesia: Choice | Laterality: Left

## 2014-04-02 MED ORDER — OXYCODONE HCL 5 MG PO TABS: 5.0000 mg | ORAL_TABLET | Freq: Once | ORAL | Status: DC | PRN

## 2014-04-02 MED ORDER — EPHEDRINE SULFATE 50 MG/ML IJ SOLN
INTRAMUSCULAR | Status: DC | PRN
  Administered 2014-04-02 (×2): 10 mg via INTRAVENOUS

## 2014-04-02 MED ORDER — POVIDONE-IODINE 7.5 % EX SOLN: Freq: Once | CUTANEOUS | Status: DC

## 2014-04-02 MED ORDER — OXYCODONE HCL 5 MG/5ML PO SOLN: 5.0000 mg | Freq: Once | ORAL | Status: DC | PRN

## 2014-04-02 MED ORDER — FENTANYL CITRATE 0.05 MG/ML IJ SOLN: 50.0000 ug | INTRAMUSCULAR | Status: DC | PRN

## 2014-04-02 MED ORDER — CEFAZOLIN SODIUM-DEXTROSE 2-3 GM-% IV SOLR
INTRAVENOUS | Status: AC
Start: 2014-04-02 — End: 2014-04-02
  Filled 2014-04-02: qty 50

## 2014-04-02 MED ORDER — DEXAMETHASONE SODIUM PHOSPHATE 4 MG/ML IJ SOLN
INTRAMUSCULAR | Status: DC | PRN
  Administered 2014-04-02: 10 mg via INTRAVENOUS

## 2014-04-02 MED ORDER — MIDAZOLAM HCL 2 MG/2ML IJ SOLN
INTRAMUSCULAR | Status: AC
  Filled 2014-04-02: qty 2

## 2014-04-02 MED ORDER — HYDROMORPHONE HCL PF 1 MG/ML IJ SOLN: 0.2500 mg | INTRAMUSCULAR | Status: DC | PRN

## 2014-04-02 MED ORDER — PHENYLEPHRINE HCL 10 MG/ML IJ SOLN
INTRAMUSCULAR | Status: DC | PRN
  Administered 2014-04-02: 80 ug via INTRAVENOUS
  Administered 2014-04-02 (×3): 40 ug via INTRAVENOUS
  Administered 2014-04-02: 80 ug via INTRAVENOUS
  Administered 2014-04-02: 40 ug via INTRAVENOUS

## 2014-04-02 MED ORDER — ONDANSETRON HCL 4 MG/2ML IJ SOLN
INTRAMUSCULAR | Status: DC | PRN
  Administered 2014-04-02: 4 mg via INTRAVENOUS

## 2014-04-02 MED ORDER — DOCUSATE SODIUM 100 MG PO CAPS: 100.0000 mg | ORAL_CAPSULE | Freq: Three times a day (TID) | ORAL | Status: DC | PRN

## 2014-04-02 MED ORDER — OXYCODONE-ACETAMINOPHEN 5-325 MG PO TABS: 1.0000 | ORAL_TABLET | ORAL | Status: DC | PRN

## 2014-04-02 MED ORDER — CEFAZOLIN SODIUM-DEXTROSE 2-3 GM-% IV SOLR: 2.0000 g | INTRAVENOUS | Status: DC

## 2014-04-02 MED ORDER — MIDAZOLAM HCL 2 MG/2ML IJ SOLN
1.0000 mg | INTRAMUSCULAR | Status: DC | PRN
  Administered 2014-04-02: 2 mg via INTRAVENOUS

## 2014-04-02 MED ORDER — ROPIVACAINE HCL 5 MG/ML IJ SOLN
INTRAMUSCULAR | Status: DC | PRN
  Administered 2014-04-02: 30 mL via PERINEURAL

## 2014-04-02 MED ORDER — PROPOFOL 10 MG/ML IV BOLUS
INTRAVENOUS | Status: AC
  Filled 2014-04-02: qty 20

## 2014-04-02 MED ORDER — LIDOCAINE HCL (CARDIAC) 20 MG/ML IV SOLN
INTRAVENOUS | Status: DC | PRN
  Administered 2014-04-02: 50 mg via INTRAVENOUS

## 2014-04-02 MED ORDER — PROPOFOL 10 MG/ML IV BOLUS
INTRAVENOUS | Status: DC | PRN
  Administered 2014-04-02: 50 mg via INTRAVENOUS
  Administered 2014-04-02: 150 mg via INTRAVENOUS

## 2014-04-02 MED ORDER — PHENYLEPHRINE HCL 10 MG/ML IJ SOLN
10.0000 mg | INTRAVENOUS | Status: DC | PRN
  Administered 2014-04-02: 100 ug/min via INTRAVENOUS

## 2014-04-02 MED ORDER — FENTANYL CITRATE 0.05 MG/ML IJ SOLN
50.0000 ug | INTRAMUSCULAR | Status: DC | PRN
  Administered 2014-04-02: 50 ug via INTRAVENOUS

## 2014-04-02 MED ORDER — CEFAZOLIN SODIUM-DEXTROSE 2-3 GM-% IV SOLR
2.0000 g | INTRAVENOUS | Status: AC
  Administered 2014-04-02: 2000 mg via INTRAVENOUS

## 2014-04-02 MED ORDER — SODIUM CHLORIDE 0.9 % IR SOLN
Status: DC | PRN
  Administered 2014-04-02: 6

## 2014-04-02 MED ORDER — SUCCINYLCHOLINE CHLORIDE 20 MG/ML IJ SOLN
INTRAMUSCULAR | Status: DC | PRN
  Administered 2014-04-02: 100 mg via INTRAVENOUS

## 2014-04-02 MED ORDER — LACTATED RINGERS IV SOLN
INTRAVENOUS | Status: DC
  Administered 2014-04-02: 10:00:00 via INTRAVENOUS

## 2014-04-02 MED ORDER — LACTATED RINGERS IV SOLN: INTRAVENOUS | Status: DC

## 2014-04-02 MED ORDER — FENTANYL CITRATE 0.05 MG/ML IJ SOLN
INTRAMUSCULAR | Status: AC
  Filled 2014-04-02: qty 2

## 2014-04-02 MED ORDER — MIDAZOLAM HCL 2 MG/2ML IJ SOLN: 1.0000 mg | INTRAMUSCULAR | Status: DC | PRN

## 2014-04-02 MED ORDER — FENTANYL CITRATE 0.05 MG/ML IJ SOLN
INTRAMUSCULAR | Status: AC
Start: 2014-04-02 — End: 2014-04-02
  Filled 2014-04-02: qty 6

## 2014-04-02 SURGICAL SUPPLY — 85 items
ANCHOR PEEK 4.75X19.1 SWLK C (Anchor) ×9 IMPLANT
BENZOIN TINCTURE PRP APPL 2/3 (GAUZE/BANDAGES/DRESSINGS) IMPLANT
BLADE SURG 15 STRL LF DISP TIS (BLADE) IMPLANT
BLADE SURG 15 STRL SS (BLADE)
BLADE SURG ROTATE 9660 (MISCELLANEOUS) IMPLANT
BUR OVAL 4.0 (BURR) ×3 IMPLANT
CANISTER SUCT 3000ML (MISCELLANEOUS) IMPLANT
CANNULA 5.75X71 LONG (CANNULA) ×3 IMPLANT
CANNULA TWIST IN 8.25X7CM (CANNULA) IMPLANT
CHLORAPREP W/TINT 26ML (MISCELLANEOUS) ×3 IMPLANT
CLOSURE WOUND 1/2 X4 (GAUZE/BANDAGES/DRESSINGS)
DECANTER SPIKE VIAL GLASS SM (MISCELLANEOUS) IMPLANT
DERMABOND ADVANCED (GAUZE/BANDAGES/DRESSINGS)
DERMABOND ADVANCED .7 DNX12 (GAUZE/BANDAGES/DRESSINGS) IMPLANT
DRAPE INCISE IOBAN 66X45 STRL (DRAPES) ×3 IMPLANT
DRAPE STERI 35X30 U-POUCH (DRAPES) ×3 IMPLANT
DRAPE SURG 17X23 STRL (DRAPES) ×3 IMPLANT
DRAPE U 20/CS (DRAPES) ×3 IMPLANT
DRAPE U-SHAPE 47X51 STRL (DRAPES) ×3 IMPLANT
DRAPE U-SHAPE 76X120 STRL (DRAPES) ×6 IMPLANT
DRSG PAD ABDOMINAL 8X10 ST (GAUZE/BANDAGES/DRESSINGS) ×3 IMPLANT
ELECT REM PT RETURN 9FT ADLT (ELECTROSURGICAL) ×3
ELECTRODE REM PT RTRN 9FT ADLT (ELECTROSURGICAL) ×1 IMPLANT
GAUZE SPONGE 4X4 12PLY STRL (GAUZE/BANDAGES/DRESSINGS) ×3 IMPLANT
GAUZE SPONGE 4X4 16PLY XRAY LF (GAUZE/BANDAGES/DRESSINGS) IMPLANT
GAUZE XEROFORM 1X8 LF (GAUZE/BANDAGES/DRESSINGS) ×3 IMPLANT
GLOVE BIO SURGEON STRL SZ 6.5 (GLOVE) ×4 IMPLANT
GLOVE BIO SURGEON STRL SZ7 (GLOVE) ×3 IMPLANT
GLOVE BIO SURGEON STRL SZ7.5 (GLOVE) ×6 IMPLANT
GLOVE BIO SURGEONS STRL SZ 6.5 (GLOVE) ×2
GLOVE BIOGEL PI IND STRL 7.0 (GLOVE) ×3 IMPLANT
GLOVE BIOGEL PI IND STRL 8 (GLOVE) ×2 IMPLANT
GLOVE BIOGEL PI INDICATOR 7.0 (GLOVE) ×6
GLOVE BIOGEL PI INDICATOR 8 (GLOVE) ×4
GOWN STRL REUS W/ TWL LRG LVL3 (GOWN DISPOSABLE) ×3 IMPLANT
GOWN STRL REUS W/TWL LRG LVL3 (GOWN DISPOSABLE) ×6
GOWN STRL REUS W/TWL XL LVL4 (GOWN DISPOSABLE) ×3 IMPLANT
IMMOBILIZER SHOULDER FOAM XLGE (SOFTGOODS) IMPLANT
LASSO CRESCENT QUICKPASS (SUTURE) ×3 IMPLANT
MANIFOLD NEPTUNE II (INSTRUMENTS) ×3 IMPLANT
NDL SUT 6 .5 CRC .975X.05 MAYO (NEEDLE) IMPLANT
NEEDLE 1/2 CIR CATGUT .05X1.09 (NEEDLE) IMPLANT
NEEDLE MAYO TAPER (NEEDLE)
NEEDLE SCORPION MULTI FIRE (NEEDLE) IMPLANT
NS IRRIG 1000ML POUR BTL (IV SOLUTION) IMPLANT
PACK ARTHROSCOPY DSU (CUSTOM PROCEDURE TRAY) ×3 IMPLANT
PACK BASIN DAY SURGERY FS (CUSTOM PROCEDURE TRAY) ×3 IMPLANT
PENCIL BUTTON HOLSTER BLD 10FT (ELECTRODE) IMPLANT
RESECTOR FULL RADIUS 4.2MM (BLADE) ×3 IMPLANT
SHEET MEDIUM DRAPE 40X70 STRL (DRAPES) IMPLANT
SLEEVE SCD COMPRESS KNEE MED (MISCELLANEOUS) ×3 IMPLANT
SLING ARM IMMOBILIZER LRG (SOFTGOODS) ×3 IMPLANT
SLING ARM IMMOBILIZER MED (SOFTGOODS) IMPLANT
SLING ARM LRG ADULT FOAM STRAP (SOFTGOODS) IMPLANT
SLING ARM MED ADULT FOAM STRAP (SOFTGOODS) IMPLANT
SLING ARM XL FOAM STRAP (SOFTGOODS) IMPLANT
SPONGE LAP 4X18 X RAY DECT (DISPOSABLE) IMPLANT
STRIP CLOSURE SKIN 1/2X4 (GAUZE/BANDAGES/DRESSINGS) IMPLANT
SUCTION FRAZIER TIP 10 FR DISP (SUCTIONS) IMPLANT
SUPPORT WRAP ARM LG (MISCELLANEOUS) IMPLANT
SUT BONE WAX W31G (SUTURE) IMPLANT
SUT ETHIBOND 2 OS 4 DA (SUTURE) IMPLANT
SUT ETHILON 3 0 PS 1 (SUTURE) ×3 IMPLANT
SUT ETHILON 4 0 PS 2 18 (SUTURE) IMPLANT
SUT FIBERWIRE #2 38 T-5 BLUE (SUTURE)
SUT MNCRL AB 3-0 PS2 18 (SUTURE) IMPLANT
SUT MNCRL AB 4-0 PS2 18 (SUTURE) IMPLANT
SUT PDS AB 0 CT 36 (SUTURE) IMPLANT
SUT PROLENE 3 0 PS 2 (SUTURE) IMPLANT
SUT TIGER TAPE 7 IN WHITE (SUTURE) ×3 IMPLANT
SUT VIC AB 0 CT1 27 (SUTURE)
SUT VIC AB 0 CT1 27XBRD ANBCTR (SUTURE) IMPLANT
SUT VIC AB 2-0 SH 27 (SUTURE)
SUT VIC AB 2-0 SH 27XBRD (SUTURE) IMPLANT
SUTURE FIBERWR #2 38 T-5 BLUE (SUTURE) IMPLANT
SYR BULB 3OZ (MISCELLANEOUS) IMPLANT
TAPE FIBER 2MM 7IN #2 BLUE (SUTURE) ×9 IMPLANT
TOWEL OR 17X24 6PK STRL BLUE (TOWEL DISPOSABLE) ×3 IMPLANT
TOWEL OR NON WOVEN STRL DISP B (DISPOSABLE) ×3 IMPLANT
TUBE CONNECTING 20'X1/4 (TUBING) ×1
TUBE CONNECTING 20X1/4 (TUBING) ×2 IMPLANT
TUBING ARTHROSCOPY IRRIG 16FT (MISCELLANEOUS) ×3 IMPLANT
WAND STAR VAC 90 (SURGICAL WAND) ×3 IMPLANT
WATER STERILE IRR 1000ML POUR (IV SOLUTION) ×3 IMPLANT
YANKAUER SUCT BULB TIP NO VENT (SUCTIONS) IMPLANT

## 2014-04-02 NOTE — Transfer of Care (Signed)
Immediate Anesthesia Transfer of Care Note  Patient: Tommy Sullivan  Procedure(s) Performed: Procedure(s) with comments: SHOULDER ARTHROSCOPY WITH SUBACROMIAL DECOMPRESSION WITH ROTATOR CUFF REPAIR (Left) - Left shoulder rotator cuff repair, subacromial decompression  Patient Location: PACU  Anesthesia Type:GA combined with regional for post-op pain  Level of Consciousness: sedated  Airway & Oxygen Therapy: Patient Spontanous Breathing and Patient connected to face mask oxygen  Post-op Assessment: Report given to PACU RN and Post -op Vital signs reviewed and stable  Post vital signs: Reviewed and stable  Complications: No apparent anesthesia complications

## 2014-04-02 NOTE — Telephone Encounter (Signed)
Faxed cardiac clearance as requested by nurse

## 2014-04-02 NOTE — Discharge Instructions (Signed)
Discharge Instructions after Arthroscopic Shoulder Repair ° ° °A sling has been provided for you. Remain in your sling at all times. This includes sleeping in your sling.  °Use ice on the shoulder intermittently over the first 48 hours after surgery.  °Pain medicine has been prescribed for you.  °Use your medicine liberally over the first 48 hours, and then you can begin to taper your use. You may take Extra Strength Tylenol or Tylenol only in place of the pain pills. DO NOT take ANY nonsteroidal anti-inflammatory pain medications: Advil, Motrin, Ibuprofen, Aleve, Naproxen, or Narprosyn.  °You may remove your dressing after two days. If the incision sites are still moist, place a Band-Aid over the moist site(s). Change Band-Aids daily until dry.  °You may shower 5 days after surgery. The incisions CANNOT get wet prior to 5 days. Simply allow the water to wash over the site and then pat dry. Do not rub the incisions. Make sure your axilla (armpit) is completely dry after showering.  °Take one aspirin a day for 2 weeks after surgery, unless you have an aspirin sensitivity/ allergy or asthma. ° ° °Please call [phone] during normal business hours or [phone] after hours for any problems. Including the following: ° °- excessive redness of the incisions °- drainage for more than 4 days °- fever of more than 101.5 F ° °*Please note that pain medications will not be refilled after hours or on weekends. ° ° ° °Post Anesthesia Home Care Instructions ° °Activity: °Get plenty of rest for the remainder of the day. A responsible adult should stay with you for 24 hours following the procedure.  °For the next 24 hours, DO NOT: °-Drive a car °-Operate machinery °-Drink alcoholic beverages °-Take any medication unless instructed by your physician °-Make any legal decisions or sign important papers. ° °Meals: °Start with liquid foods such as gelatin or soup. Progress to regular foods as tolerated. Avoid greasy, spicy, heavy  foods. If nausea and/or vomiting occur, drink only clear liquids until the nausea and/or vomiting subsides. Call your physician if vomiting continues. ° °Special Instructions/Symptoms: °Your throat may feel dry or sore from the anesthesia or the breathing tube placed in your throat during surgery. If this causes discomfort, gargle with warm salt water. The discomfort should disappear within 24 hours. ° ° °Regional Anesthesia Blocks ° °1. Numbness or the inability to move the "blocked" extremity may last from 3-48 hours after placement. The length of time depends on the medication injected and your individual response to the medication. If the numbness is not going away after 48 hours, call your surgeon. ° °2. The extremity that is blocked will need to be protected until the numbness is gone and the  Strength has returned. Because you cannot feel it, you will need to take extra care to avoid injury. Because it may be weak, you may have difficulty moving it or using it. You may not know what position it is in without looking at it while the block is in effect. ° °3. For blocks in the legs and feet, returning to weight bearing and walking needs to be done carefully. You will need to wait until the numbness is entirely gone and the strength has returned. You should be able to move your leg and foot normally before you try and bear weight or walk. You will need someone to be with you when you first try to ensure you do not fall and possibly risk injury. ° °4. Bruising and tenderness   at the needle site are common side effects and will resolve in a few days. ° °5. Persistent numbness or new problems with movement should be communicated to the surgeon or the [facility] ([phone])/ [facility] ([phone]). °

## 2014-04-02 NOTE — H&P (Signed)
Tommy Sullivan is an 68 y.o. male.   Chief Complaint: L shoulder pain HPI: s/p L shoulder injury with pain/weakness.  MRI with full thickness rotator cuff tear.  Past Medical History  Diagnosis Date  . Hyperlipidemia   . Hypertension   . Allergy   . Glaucoma   . Retinal vascular occlusion, unspecified     Right  . Extrinsic asthma, unspecified   . Coronary atherosclerosis of unspecified type of vessel, native or graft   . Gout   . Shortness of breath   . Diabetes mellitus without complication   . Arthritis   . Heart murmur   . Myocardial infarction 1/09    PCI done by Dr. Collene Mares in [place]  . Dysrhythmia     "hx HR speeding up at times"    Past Surgical History  Procedure Laterality Date  . Tonsillectomy    . Appendectomy  1954  . Neck surgery  1990's    x2, Fusion  . Colonoscopy    . Coronary angioplasty  2009    stent [facility]    Family History  Problem Relation Age of Onset  . Diabetes Sister   . Diabetes Brother   . Coronary artery disease Brother   . AAA (abdominal aortic aneurysm) Father    Social History:  reports that he quit smoking about 25 years ago. His smoking use included Cigarettes. He has a 20 pack-year smoking history. He has never used smokeless tobacco. He reports that he drinks about 1.8 ounces of alcohol per week. He reports that he does not use illicit drugs.  Allergies: No Known Allergies  Medications Prior to Admission  Medication Sig Dispense Refill  . aspirin 81 MG tablet Take 81 mg by mouth daily.        . Cholecalciferol (VITAMIN D3) 2000 UNITS TABS Take 2,000 Units by mouth daily.       . fenofibrate 160 MG tablet Take 160 mg by mouth daily.      . fish oil-omega-3 fatty acids 1000 MG capsule Take 2 g by mouth 2 (two) times daily.       Marland Kitchen glucose blood (TRUETEST TEST) test strip Use as instructed to test blood sugar 1-2 times daily dx: 250.02  200 each  3  . Lancets (FREESTYLE) lancets 1 each by Other route 2 (two) times  daily. Use as instructed       . losartan (COZAAR) 100 MG tablet Take 100 mg by mouth daily.      . modafinil (PROVIGIL) 200 MG tablet Take 1 tablet (200 mg total) by mouth daily.  90 tablet  3  . naproxen sodium (ANAPROX) 220 MG tablet Take 220 mg by mouth 2 (two) times daily as needed (pain).      . nebivolol (BYSTOLIC) 10 MG tablet Take 1 tablet (10 mg total) by mouth daily.  90 tablet  3  . rosuvastatin (CRESTOR) 10 MG tablet Take 10 mg by mouth at bedtime.      . tamsulosin (FLOMAX) 0.4 MG CAPS capsule Take 0.4 mg by mouth daily.      . timolol (BETIMOL) 0.5 % ophthalmic solution Place 1 drop into both eyes at bedtime.        No results found for this or any previous visit (from the past 48 hour(s)). No results found.  Review of Systems  All other systems reviewed and are negative.   Blood pressure 105/73, pulse 78, temperature 98.1 F (36.7 C), temperature source Oral, resp.  rate 20, height 5' 10.5" (1.791 m), weight 107.162 kg (236 lb 4 oz), SpO2 96.00%. Physical Exam  Constitutional: He is oriented to person, place, and time. He appears well-developed and well-nourished.  HENT:  Head: Atraumatic.  Eyes: EOM are normal.  Cardiovascular: Intact distal pulses.   Respiratory: Effort normal.  Musculoskeletal:  L shoulder pain and weakness with rotator cuff testing.  Neurological: He is alert and oriented to person, place, and time.  Skin: Skin is warm and dry.  Psychiatric: He has a normal mood and affect.     Assessment/Plan Plan L arthroscopic rotator cuff repair, SAD Risks / benefits of surgery discussed Consent on chart  NPO for OR Preop antibiotics He is in atrial flutter, but has been evaluated and cleared by cardiolgy.  Nita Sells 04/02/2014, 10:22 AM

## 2014-04-02 NOTE — Progress Notes (Signed)
Assisted Dr. Fitzgerald with left, ultrasound guided, interscalene  block. Side rails up, monitors on throughout procedure. See vital signs in flow sheet. Tolerated Procedure well. 

## 2014-04-02 NOTE — Op Note (Signed)
Procedure(s): SHOULDER ARTHROSCOPY WITH SUBACROMIAL DECOMPRESSION WITH ROTATOR CUFF REPAIR Procedure Note  Tommy Sullivan male 68 y.o. 04/02/2014  Procedure(s) and Anes #[id number] Type: #1 left shoulder arthroscopic rotator cuff repair, supraspinatus and infraspinatus #2 left shoulder arthroscopic subacromial decompression #3 left shoulder arthroscopic extensive debridement of proximal long head biceps tendon tear and labral tear     Surgeon(s) and Role:    * Nita Sells, MD - Primary     Surgeon: Nita Sells   Assistants: Jeanmarie Hubert PA-C (Danielle was present and scrubbed throughout the procedure and was essential in positioning, assisting with the camera and instrumentation,, and closure)  Anesthesia: General endotracheal anesthesia with preoperative interscalene block given by the attending anesthesiologist in    Procedure Detail  Estimated Blood Loss: Min         Drains: none  Blood Given: none         Specimens: none        Complications:  * No complications entered in OR log *         Disposition: PACU - hemodynamically stable.         Condition: stable    Procedure:   INDICATIONS FOR SURGERY: The patient is 68 y.o. male who injured his left shoulder suffering a rotator cuff tear including subscapularis and supraspinatus tendons. Indicated for surgical treatment to decrease pain and restore function. He understood risks benefits alternatives to the procedure including but not limited to risk of bleeding infection damage to neurovascular structures, stiffness, nonhealing, and the potential need for future surgery. He wished to go forward with surgery.  OPERATIVE FINDINGS: Examination under anesthesia: No stiffness or instability Diagnostic Arthroscopy:  Glenoid articular cartilage: Intact Humeral head articular cartilage: Intact Labrum: Extensive anterior and superior labral tearing, previous long head biceps tendon tear with  residual stump which required extensive debridement. Loose bodies: None Synovitis: Moderate  rotator cuff: Upper one half tear of the subscapularis with minimal retraction, full tear of the supraspinatus with minimal retraction. Both repaired with fiber tapes and swivel lock anchors.   DESCRIPTION OF PROCEDURE: The patient was identified in preoperative  holding area where I personally marked the operative site after  verifying site, side, and procedure with the patient. An interscalene block was given by the attending anesthesiologist the holding area.  The patient was taken back to the operating room where general anesthesia was induced without complication and was placed in the beach-chair position with the back  elevated about 60 degrees and all extremities and head and neck carefully padded and  positioned.   The left upper extremity was then prepped and  draped in a standard sterile fashion. The appropriate time-out  procedure was carried out. The patient did receive IV antibiotics  within 30 minutes of incision.   A small posterior portal incision was made and the arthroscope was introduced into the joint. An anterior portal was then established above the subscapularis using needle localization. Small cannula was placed anteriorly. Diagnostic arthroscopy was then carried out with findings as described above.  He was noted to have a prior long head biceps tendon rupture with one has to centimeters of remaining intra-articular stump with extensive fraying. This was extensively debrided back to the labrum. The superior labrum extending into the anterior labrum is torn which also required extensive debridement with a shaver and. The subscapularis was torn from the upper half of the lesser tuberosity. A large cannula was placed anteriorly. Small cannula was placed in a lateral portal position  through the rotator cuff tear the supraspinatus. The subscapularis was mobilized and freed from  surrounding soft tissues and then repaired back to the lesser tuberosity using a fiber tape in an inverted mattress configuration passed with a suture lasso and placed into the tuberosity after burring down to bleeding bone with a 4.75 peak swivel lock anchor with excellent fixation.   The arthroscope was then introduced into the subacromial space and supraspinatus tear was identified. The tear edge was debrided. The residual tendon on the tuberosity was debrided. Extensive bursectomy was carried out for exposure. The tear could easily be reduced back to the greater tuberosity. The tuberosity was repaired with a bur down to bleeding bone. A anterolateral portal was established for anchor placement. A posterior lateral viewing portal was established. The 2 fiber tapes were then passed in an inverted mattress configuration through the tendon anteriorly and posteriorly and brought into 2 separate 4.75 peak swivel lock anchors with excellent fixation. Final repair was viewed from lateral and posterior portals and felt to be excellent. There is no undue tension on the repair.  The coracoacromial ligament was taken down off the anterior acromion with the ArthroCare exposing a large hooked anterior acromial spur. A high-speed bur was then used through the lateral portal to take down the anterior acromial spur from lateral to medial in a standard acromioplasty.  The acromioplasty was also viewed from the lateral portal and the bur was used as necessary to ensure that the acromion was completely flat from posterior to anterior.  The arthroscopic equipment was removed from the joint and the portals were closed with 3-0 nylon in an interrupted fashion. Sterile dressings were then applied including Xeroform 4 x 4's ABDs and tape. The patient was then allowed to awaken from general anesthesia, placed in a sling, transferred to the stretcher and taken to the recovery room in stable condition.   POSTOPERATIVE PLAN: The  patient will be discharged home today and will followup in one week for suture removal and wound check.  He will follow large cuff protocol.

## 2014-04-02 NOTE — Telephone Encounter (Signed)
This encounter was created in error - please disregard.

## 2014-04-02 NOTE — Anesthesia Procedure Notes (Addendum)
Anesthesia Regional Block:  Interscalene brachial plexus block  Pre-Anesthetic Checklist: ,, timeout performed, Correct Patient, Correct Site, Correct Laterality, Correct Procedure, Correct Position, site marked, Risks and benefits discussed, pre-op evaluation,  At surgeon's request and post-op pain management  Laterality: Left  Prep: Maximum Sterile Barrier Precautions used and chloraprep       Needles:  Injection technique: Single-shot  Needle Type: Echogenic Stimulator Needle     Needle Length: 5cm 5 cm Needle Gauge: 22 and 22 G    Additional Needles:  Procedures: ultrasound guided (picture in chart) and nerve stimulator Interscalene brachial plexus block  Nerve Stimulator or Paresthesia:  Response: Biceps response,   Additional Responses:   Narrative:  Start time: 04/02/2014 10:20 AM End time: 04/02/2014 10:30 AM Injection made incrementally with aspirations every 5 mL. Anesthesiologist: Ola Spurr, MD  Additional Notes: 2% Lidocaine skin wheel.    Procedure Name: Intubation Date/Time: 04/02/2014 10:47 AM Performed by: Lieutenant Diego Pre-anesthesia Checklist: Patient identified, Emergency Drugs available, Suction available and Patient being monitored Patient Re-evaluated:Patient Re-evaluated prior to inductionOxygen Delivery Method: Circle System Utilized Preoxygenation: Pre-oxygenation with 100% oxygen Intubation Type: IV induction Ventilation: Mask ventilation without difficulty Laryngoscope Size: Miller, 2 and 3 (glidescope) Tube type: Oral Tube size: 8.0 mm Number of attempts: 3 Airway Equipment and Method: stylet and oral airway Placement Confirmation: ETT inserted through vocal cords under direct vision,  positive ETCO2 and breath sounds checked- equal and bilateral Secured at: 23 cm Tube secured with: Tape Dental Injury: Teeth and Oropharynx as per pre-operative assessment  Difficulty Due To: Difficulty was anticipated and Difficult Airway- due to anterior  larynx Comments: Used glidescope after 2 attempts with mil 2 and 3. EF at bedside. Sats 98-100%

## 2014-04-02 NOTE — Anesthesia Postprocedure Evaluation (Signed)
  Anesthesia Post-op Note  Patient: Tommy Sullivan  Procedure(s) Performed: Procedure(s) with comments: SHOULDER ARTHROSCOPY WITH SUBACROMIAL DECOMPRESSION WITH ROTATOR CUFF REPAIR (Left) - Left shoulder rotator cuff repair, subacromial decompression  Patient Location: PACU  Anesthesia Type:GA combined with regional for post-op pain  Level of Consciousness: awake, alert  and oriented  Airway and Oxygen Therapy: Patient Spontanous Breathing  Post-op Pain: none  Post-op Assessment: Post-op Vital signs reviewed  Post-op Vital Signs: Reviewed  Last Vitals:  Filed Vitals:   04/02/14 1400  BP: 94/66  Pulse:   Temp:   Resp:     Complications: No apparent anesthesia complications

## 2014-04-02 NOTE — Anesthesia Preprocedure Evaluation (Addendum)
Anesthesia Evaluation  Patient identified by MRN, date of birth, ID band Patient awake    Reviewed: Allergy & Precautions, H&P , NPO status , Patient's Chart, lab work & pertinent test results, reviewed documented beta blocker date and time   Airway Mallampati: II TM Distance: >3 FB Neck ROM: Full    Dental no notable dental hx. (+) Teeth Intact, Dental Advisory Given   Pulmonary asthma , sleep apnea and Continuous Positive Airway Pressure Ventilation , former smoker,  breath sounds clear to auscultation  Pulmonary exam normal       Cardiovascular hypertension, On Medications and On Home Beta Blockers + CAD, + Past MI and + Cardiac Stents + dysrhythmias Atrial Fibrillation Rhythm:Regular Rate:Normal     Neuro/Psych negative neurological ROS  negative psych ROS   GI/Hepatic negative GI ROS, Neg liver ROS,   Endo/Other  negative endocrine ROSdiabetes  Renal/GU negative Renal ROS  negative genitourinary   Musculoskeletal   Abdominal   Peds  Hematology negative hematology ROS (+)   Anesthesia Other Findings   Reproductive/Obstetrics negative OB ROS                          Anesthesia Physical Anesthesia Plan  ASA: III  Anesthesia Plan: General and Regional   Post-op Pain Management:    Induction: Intravenous  Airway Management Planned: Oral ETT  Additional Equipment:   Intra-op Plan:   Post-operative Plan: Extubation in OR  Informed Consent: I have reviewed the patients History and Physical, chart, labs and discussed the procedure including the risks, benefits and alternatives for the proposed anesthesia with the patient or authorized representative who has indicated his/her understanding and acceptance.   Dental advisory given  Plan Discussed with: CRNA  Anesthesia Plan Comments:         Anesthesia Quick Evaluation

## 2014-04-06 ENCOUNTER — Encounter (HOSPITAL_BASED_OUTPATIENT_CLINIC_OR_DEPARTMENT_OTHER): Payer: Self-pay | Admitting: Orthopedic Surgery

## 2014-04-09 ENCOUNTER — Ambulatory Visit: Payer: 59 | Admitting: Cardiovascular Disease

## 2014-04-09 ENCOUNTER — Other Ambulatory Visit (INDEPENDENT_AMBULATORY_CARE_PROVIDER_SITE_OTHER): Payer: 59

## 2014-04-09 ENCOUNTER — Other Ambulatory Visit: Payer: Self-pay

## 2014-04-09 DIAGNOSIS — I4892 Unspecified atrial flutter: Secondary | ICD-10-CM

## 2014-04-09 DIAGNOSIS — R9431 Abnormal electrocardiogram [ECG] [EKG]: Secondary | ICD-10-CM

## 2014-04-09 DIAGNOSIS — I484 Atypical atrial flutter: Secondary | ICD-10-CM

## 2014-04-09 DIAGNOSIS — I2510 Atherosclerotic heart disease of native coronary artery without angina pectoris: Secondary | ICD-10-CM

## 2014-04-10 ENCOUNTER — Telehealth: Payer: Self-pay

## 2014-04-10 NOTE — Telephone Encounter (Signed)
Informed patient of echo results. Patient verbalized understanding.

## 2014-04-10 NOTE — Telephone Encounter (Signed)
Pt would like echo results. Please call. 

## 2014-04-10 NOTE — Telephone Encounter (Signed)
Pt would like to know if the appt w/Dr. Caryl Comes is necessary. Please call.

## 2014-04-11 ENCOUNTER — Encounter: Payer: 59 | Attending: Family Medicine

## 2014-04-11 VITALS — Ht 70.5 in | Wt 237.9 lb

## 2014-04-11 DIAGNOSIS — Z713 Dietary counseling and surveillance: Secondary | ICD-10-CM | POA: Insufficient documentation

## 2014-04-11 DIAGNOSIS — E119 Type 2 diabetes mellitus without complications: Secondary | ICD-10-CM

## 2014-04-13 ENCOUNTER — Other Ambulatory Visit: Payer: 59

## 2014-04-13 NOTE — Progress Notes (Signed)
Patient was seen on 04/11/14 for the complete diabetes self-management series at the [facility]. This is a part of the [facility] [employer].  Current A1c = 7.6%   On  12/02/12  Handouts given during class include:  Living Well with Diabetes book  Carb Counting and Meal Planning book  Meal Plan Card  Carbohydrate guide  Meal planning worksheet  Low Sodium Flavoring Tips  The diabetes portion plate  Low Carbohydrate Snack Suggestions  A1c to eAG Conversion Chart  Diabetes Medications  Stress Management  Diabetes Recommended Care Schedule  Diabetes Success Plan  Core Class Satisfaction Survey  The following learning objectives were met by the patient during this course:  Describe diabetes  State some common risk factors for diabetes  Defines the role of glucose and insulin  Identifies type of diabetes and pathophysiology  Describe the relationship between diabetes and cardiovascular risk  State the members of the Healthcare Team  States the rationale for glucose monitoring  State when to test glucose  State their individual Target Range  State the importance of logging glucose readings  Describe how to interpret glucose readings  Identifies A1C target  Explain the correlation between A1c and eAG values  State symptoms and treatment of high blood glucose  State symptoms and treatment of low blood glucose  Explain proper technique for glucose testing  Identifies proper sharps disposal  Describe the role of different macronutrients on glucose  Explain how carbohydrates affect blood glucose  State what foods contain the most carbohydrates  Demonstrate carbohydrate counting  Demonstrate how to read Nutrition Facts food label  Describe effects of various fats on heart health  Describe the importance of good nutrition for health and healthy eating strategies  Describe techniques for managing your shopping,  cooking and meal planning  List strategies to follow meal plan when dining out  Describe the effects of alcohol on glucose and how to use it safely   State the amount of activity recommended for healthy living   Describe activities suitable for individual needs   Identify ways to regularly incorporate activity into daily life   Identify barriers to activity and ways to over come these barriers  Identify diabetes medications being personally used and their primary action for lowering glucose and possible side effects   Describe role of stress on blood glucose and develop strategies to address psychosocial issues   Identify diabetes complications and ways to prevent them  Explain how to manage diabetes during illness   Evaluate success in meeting personal goal   Establish 2-3 goals that they will plan to diligently work on until they return for the  39-monthfollow-up visit  Goals:  Follow Diabetes Meal Plan as instructed  Eat 3 meals and 2 snacks, every 3-5 hrs  Limit carbohydrate intake to 45 grams carbohydrate/meal Limit carbohydrate intake to 15 grams carbohydrate/snack Add lean protein foods to meals/snacks  Monitor glucose levels as instructed by your doctor  Aim for 15-30 mins of physical activity daily as tolerated  Bring food record and glucose log to all healthcare visits  Your patient has established the following 4 month goals in their individualized success plan: I will count my carb choices at most meals and snacks I will increase my activity level following recovery from heart issues I will test my glucose at least 2 times a day 7 days a week I will look at patterns in my record book frequently To help manage stress ....Marland KitchenMarland KitchenMarland Kitchen  I have none   Your patient has identified these potential barriers to change:  Present health issues  Ready to get back on program after healing  Your patient has identified their diabetes self-care support plan as  [facility] Support  Group  American Diabetes Association web site  Family support  On-Line resources  Phone apps  Plan: Follow up with [facility] Coordinator

## 2014-04-16 ENCOUNTER — Encounter: Payer: Self-pay | Admitting: Internal Medicine

## 2014-04-16 ENCOUNTER — Ambulatory Visit: Payer: 59 | Admitting: Family Medicine

## 2014-04-17 ENCOUNTER — Telehealth: Payer: Self-pay | Admitting: Internal Medicine

## 2014-04-17 ENCOUNTER — Ambulatory Visit (INDEPENDENT_AMBULATORY_CARE_PROVIDER_SITE_OTHER): Payer: 59 | Admitting: Internal Medicine

## 2014-04-17 ENCOUNTER — Encounter: Payer: Self-pay | Admitting: Internal Medicine

## 2014-04-17 ENCOUNTER — Encounter: Payer: Self-pay | Admitting: *Deleted

## 2014-04-17 VITALS — BP 140/97 | HR 90 | Ht 70.5 in | Wt 238.0 lb

## 2014-04-17 DIAGNOSIS — I4892 Unspecified atrial flutter: Secondary | ICD-10-CM | POA: Diagnosis not present

## 2014-04-17 DIAGNOSIS — Z01812 Encounter for preprocedural laboratory examination: Secondary | ICD-10-CM

## 2014-04-17 DIAGNOSIS — I2510 Atherosclerotic heart disease of native coronary artery without angina pectoris: Secondary | ICD-10-CM

## 2014-04-17 NOTE — Telephone Encounter (Signed)
Advised to continue Eliquis for upcoming ablation. Pt verbalized understanding.

## 2014-04-17 NOTE — Progress Notes (Signed)
ELECTROPHYSIOLOGY CONSULT NOTE  Patient ID: Tommy Sullivan, MRN: [id number], DOB/AGE: April 09, 1946 68 y.o. Admit date: (Not on file) Date of Consult: 04/17/2014  Primary Physician: Eliezer Lofts, MD Primary Cardiologist: MA  Chief Complaint: Atrial flutter   HPI Tommy Sullivan is a 68 y.o. male  Referred for atrial flutter.  This is identified on a preoperative cardiovascular assessment. He apparently had been previously told he had some kind of tachyarrhythmia. He had no treatable symptoms.  Thromboembolic risk factors are notable for hypertension, diabetes, age, coronary disease for a CHADS-VASc score of 4  Echocardiogram 7/15 demonstrated ejection fraction of 40-45% with global hypokinesis. Left atrial size was normal. Myoview scanning demonstrated evidence of a prior infero-/anterolateral scar  without   ischemia.   Past Medical History  Diagnosis Date  . Hyperlipidemia   . Hypertension   . Allergy   . Glaucoma   . Retinal vascular occlusion, unspecified     Right  . Extrinsic asthma, unspecified   . Coronary atherosclerosis of unspecified type of vessel, native or graft   . Gout   . Shortness of breath   . Diabetes mellitus without complication   . Arthritis   . Heart murmur   . Myocardial infarction 1/09    PCI done by Dr. Collene Mares in [place]  . Dysrhythmia     "hx HR speeding up at times"      Surgical History:  Past Surgical History  Procedure Laterality Date  . Tonsillectomy    . Appendectomy  1954  . Neck surgery  1990's    x2, Fusion  . Colonoscopy    . Coronary angioplasty  2009    stent [place]  . Shoulder arthroscopy with subacromial decompression Left 04/02/2014    Procedure: SHOULDER ARTHROSCOPY WITH SUBACROMIAL DECOMPRESSION WITH ROTATOR CUFF REPAIR;  Surgeon: Nita Sells, MD;  Location: [facility];  Service: Orthopedics;  Laterality: Left;  Left shoulder rotator cuff repair, subacromial decompression     Home  Meds: Prior to Admission medications   Medication Sig Start Date End Date Taking? Authorizing Provider  aspirin 81 MG tablet Take 81 mg by mouth daily.     Yes Historical Provider, MD  Cholecalciferol (VITAMIN D3) 2000 UNITS TABS Take 2,000 Units by mouth daily.    Yes Historical Provider, MD  fenofibrate 160 MG tablet Take 160 mg by mouth daily.   Yes Historical Provider, MD  fish oil-omega-3 fatty acids 1000 MG capsule Take 2 g by mouth 2 (two) times daily.    Yes Historical Provider, MD  glucose blood (TRUETEST TEST) test strip Use as instructed to test blood sugar 1-2 times daily dx: 250.02 05/06/13  Yes Amy Cletis Athens, MD  Lancets (FREESTYLE) lancets 1 each by Other route 2 (two) times daily. Use as instructed    Yes Historical Provider, MD  losartan (COZAAR) 100 MG tablet Take 100 mg by mouth daily.   Yes Historical Provider, MD  modafinil (PROVIGIL) 200 MG tablet Take 1 tablet (200 mg total) by mouth daily. 10/27/13  Yes Kathee Delton, MD  nebivolol (BYSTOLIC) 10 MG tablet Take 1 tablet (10 mg total) by mouth daily. 05/06/13  Yes Amy Cletis Athens, MD  rosuvastatin (CRESTOR) 10 MG tablet Take 10 mg by mouth at bedtime.   Yes Historical Provider, MD  tamsulosin (FLOMAX) 0.4 MG CAPS capsule Take 0.4 mg by mouth daily.   Yes Historical Provider, MD  timolol (BETIMOL) 0.5 % ophthalmic solution Place 1 drop into  both eyes at bedtime.   Yes Historical Provider, MD      Allergies: No Known Allergies  History   Social History  . Marital Status: Married    Spouse Name: N/A    Number of Children: N/A  . Years of Education: N/A   Occupational History  . retired    Social History Main Topics  . Smoking status: Former Smoker -- 1.00 packs/day for 20 years    Types: Cigarettes    Quit date: 10/02/1988  . Smokeless tobacco: Never Used     Comment: quit in the 1990's  . Alcohol Use: 1.8 oz/week    3 Cans of beer per week     Comment: 3-4 drinks per week.   . Drug Use: No  . Sexual Activity:  Not on file   Other Topics Concern  . Not on file   Social History Narrative  . No narrative on file     Family History  Problem Relation Age of Onset  . Diabetes Sister   . Coronary artery disease Brother   . AAA (abdominal aortic aneurysm) Father   . Heart disease Mother   . Diabetes Mother   . Diabetes Brother      ROS:  Please see the history of present illness.     All other systems reviewed and negative.    Physical Exam: Blood pressure 140/97, pulse 90, height 5' 10.5" (1.791 m), weight 238 lb (107.956 kg). General: Well developed, well nourished male in no acute distress. Head: Normocephalic, atraumatic, sclera non-icteric, no xanthomas, nares are without discharge. EENT: normal Lymph Nodes:  none Back: without scoliosis/kyphosis, no CVA tendersness Neck: Negative for carotid bruits. JVD not elevated. Lungs: Clear bilaterally to auscultation without wheezes, rales, or rhonchi. Breathing is unlabored. Heart: Rapid but RRR with S1 S2.   2/6 systolic  murmur , rubs, or gallops appreciated. Abdomen: Soft, non-tender, non-distended with normoactive bowel sounds. No hepatomegaly. No rebound/guarding. No obvious abdominal masses. Msk:  Strength and tone appear normal for age. Extremities: No clubbing or cyanosis. No edema.  Distal pedal pulses are 2+ and equal bilaterally. Skin: Warm and Dry Neuro: Alert and oriented X 3. CN III-XII intact Grossly normal sensory and motor function . Left eye blindness Psych:  Responds to questions appropriately with a normal affect.      Labs: Cardiac Enzymes No results found for this basename: CKTOTAL, CKMB, TROPONINI,  in the last 72 hours CBC Lab Results  Component Value Date   WBC 7.8 03/19/2014   HGB 14.3 03/19/2014   HCT 41.6 03/19/2014   MCV 91.0 03/19/2014   PLT 165 03/19/2014   PROTIME: No results found for this basename: LABPROT, INR,  in the last 72 hours Chemistry No results found for this basename: NA, K, CL, CO2, BUN,  CREATININE, CALCIUM, LABALBU, PROT, BILITOT, ALKPHOS, ALT, AST, GLUCOSE,  in the last 168 hours Lipids Lab Results  Component Value Date   CHOL 121 12/03/2013   HDL 32.60* 12/03/2013   LDLCALC 55 12/03/2013   TRIG 166.0* 12/03/2013   BNP No results found for this basename: probnp   Miscellaneous No results found for this basename: DDIMER    Radiology/Studies:  No results found.   EKG: *Atrial flutter- typical Intervals-/10/42  Assessment and Plan:  Atrial flutter  Cardiomyopathy  Retinal vein occlusion  CAD  Prior MI   The patient has atrial flutter without palpitations. He has significant fatigue which may be attributable to the flutter and has a  cardiomyopathy  He recently underwent shoulder surgery but is now allowed to move his arm.  Discussed strategies for management of atrial flutter including cardioversion and catheter ablation. We discussed these in the context of the likelihood of recurrence and also the associated risk of atrial fibrillation in the range of 30-50%  We will plan to initiate anticoagulation and anticipate anesthesia supported catheter ablation of atrial flutter in about 3 weeks time. Would anticipate thereafter the discontinuation of his anticoagulant about 4 weeks later. Retinal vein occlusion is not a cardioembolic event  We discussed potential benefits and risks of catheter ablation.    Virl Axe

## 2014-04-17 NOTE — Telephone Encounter (Signed)
°  Patient was just in the office and he's not sure how to take medication. Please call and advise.

## 2014-04-17 NOTE — Patient Instructions (Signed)
Your physician recommends that you continue on your current medications as directed. Please refer to the Current Medication list given to you today.  Your physician recommends that you return for pre-procedure lab work on August 4th.  Your physician has recommended that you have an Atrial flutter ablation. Catheter ablation is a medical procedure used to treat some cardiac arrhythmias (irregular heartbeats). During catheter ablation, a long, thin, flexible tube is put into a blood vessel in your groin (upper thigh), or neck. This tube is called an ablation catheter. It is then guided to your heart through the blood vessel. Radio frequency waves destroy small areas of heart tissue where abnormal heartbeats may cause an arrhythmia to start. Please see the instruction sheet given to you today.

## 2014-04-21 DIAGNOSIS — H40059 Ocular hypertension, unspecified eye: Secondary | ICD-10-CM | POA: Diagnosis not present

## 2014-04-24 ENCOUNTER — Encounter (HOSPITAL_COMMUNITY): Payer: Self-pay | Admitting: Pharmacy Technician

## 2014-04-27 ENCOUNTER — Telehealth: Payer: Self-pay | Admitting: Internal Medicine

## 2014-04-27 NOTE — Telephone Encounter (Signed)
Horris Latino @ pre-service center is aware that Dr. Caryl Comes will be  doing pt's procedure. Horris Latino verbalized understanding.

## 2014-04-27 NOTE — Telephone Encounter (Signed)
New message     Who is doing procedure on 8-6   Dr Lovena Le or Dr Caryl Comes?   Patient thinks it is Dr Caryl Comes but the hosp says Dr Lovena Le.  Please call.

## 2014-04-28 ENCOUNTER — Other Ambulatory Visit: Payer: Self-pay | Admitting: Family Medicine

## 2014-04-28 ENCOUNTER — Other Ambulatory Visit: Payer: Self-pay | Admitting: Pulmonary Disease

## 2014-05-05 ENCOUNTER — Other Ambulatory Visit (INDEPENDENT_AMBULATORY_CARE_PROVIDER_SITE_OTHER): Payer: 59

## 2014-05-05 DIAGNOSIS — Z01812 Encounter for preprocedural laboratory examination: Secondary | ICD-10-CM

## 2014-05-05 DIAGNOSIS — I4892 Unspecified atrial flutter: Secondary | ICD-10-CM

## 2014-05-06 ENCOUNTER — Telehealth: Payer: Self-pay | Admitting: Internal Medicine

## 2014-05-06 LAB — BASIC METABOLIC PANEL
BUN: 15 mg/dL (ref 6–23)
CO2: 27 mEq/L (ref 19–32)
Calcium: 9.5 mg/dL (ref 8.4–10.5)
Chloride: 99 mEq/L (ref 96–112)
Creatinine, Ser: 1.1 mg/dL (ref 0.4–1.5)
GFR: 74.58 mL/min (ref >60.00)
GLUCOSE: 135 mg/dL — AB (ref 70–99)
POTASSIUM: 3.9 meq/L (ref 3.5–5.1)
Sodium: 135 mEq/L (ref 135–145)

## 2014-05-06 LAB — CBC WITH DIFFERENTIAL/PLATELET
Basophils Absolute: 0 10*3/uL (ref 0.0–0.1)
Basophils Relative: 0.3 % (ref 0.0–3.0)
Eosinophils Absolute: 0.3 10*3/uL (ref 0.0–0.7)
Eosinophils Relative: 4 % (ref 0.0–5.0)
HEMATOCRIT: 37.8 % — AB (ref 39.0–52.0)
Hemoglobin: 12.8 g/dL — ABNORMAL LOW (ref 13.0–17.0)
Lymphocytes Relative: 29.6 % (ref 12.0–46.0)
Lymphs Abs: 2.1 10*3/uL (ref 0.7–4.0)
MCHC: 33.9 g/dL (ref 30.0–36.0)
MCV: 92.6 fl (ref 78.0–100.0)
MONO ABS: 0.4 10*3/uL (ref 0.1–1.0)
Monocytes Relative: 6 % (ref 3.0–12.0)
NEUTROS PCT: 60.1 % (ref 43.0–77.0)
Neutro Abs: 4.4 10*3/uL (ref 1.4–7.7)
Platelets: 153 10*3/uL (ref 150.0–400.0)
RBC: 4.08 Mil/uL — ABNORMAL LOW (ref 4.22–5.81)
RDW: 13 % (ref 11.5–15.5)
WBC: 7.3 10*3/uL (ref 4.0–10.5)

## 2014-05-06 MED ORDER — SODIUM CHLORIDE 0.9 % IV SOLN
INTRAVENOUS | Status: DC
  Administered 2014-05-07: 09:00:00 via INTRAVENOUS

## 2014-05-06 NOTE — Telephone Encounter (Signed)
Patient is having surgery in the morning and has questions regarding medication.

## 2014-05-06 NOTE — Telephone Encounter (Signed)
Patient asking about eliquis samples, if he needs more. Pt will ask tomorrow while in hospital for ablation and then have wife call me for more samples of medication

## 2014-05-07 ENCOUNTER — Encounter (HOSPITAL_COMMUNITY): Payer: Self-pay | Admitting: Anesthesiology

## 2014-05-07 ENCOUNTER — Ambulatory Visit (HOSPITAL_COMMUNITY): Payer: 59 | Admitting: Anesthesiology

## 2014-05-07 ENCOUNTER — Encounter (HOSPITAL_COMMUNITY): Payer: 59 | Admitting: Anesthesiology

## 2014-05-07 ENCOUNTER — Ambulatory Visit (HOSPITAL_COMMUNITY)
Admission: RE | Admit: 2014-05-07 | Discharge: 2014-05-08 | Disposition: A | Discharge status: Home / Self Care | Payer: 59 | Source: Ambulatory Visit | Attending: Internal Medicine | Admitting: Internal Medicine

## 2014-05-07 ENCOUNTER — Encounter (HOSPITAL_COMMUNITY)
Admission: RE | Disposition: A | Discharge status: Home / Self Care | Payer: Self-pay | Source: Ambulatory Visit | Attending: Internal Medicine

## 2014-05-07 DIAGNOSIS — I2510 Atherosclerotic heart disease of native coronary artery without angina pectoris: Secondary | ICD-10-CM | POA: Insufficient documentation

## 2014-05-07 DIAGNOSIS — IMO0002 Reserved for concepts with insufficient information to code with codable children: Secondary | ICD-10-CM

## 2014-05-07 DIAGNOSIS — I252 Old myocardial infarction: Secondary | ICD-10-CM | POA: Insufficient documentation

## 2014-05-07 DIAGNOSIS — Z9861 Coronary angioplasty status: Secondary | ICD-10-CM | POA: Insufficient documentation

## 2014-05-07 DIAGNOSIS — G4730 Sleep apnea, unspecified: Secondary | ICD-10-CM | POA: Insufficient documentation

## 2014-05-07 DIAGNOSIS — I4892 Unspecified atrial flutter: Secondary | ICD-10-CM | POA: Diagnosis present

## 2014-05-07 DIAGNOSIS — I483 Typical atrial flutter: Secondary | ICD-10-CM

## 2014-05-07 DIAGNOSIS — E1165 Type 2 diabetes mellitus with hyperglycemia: Secondary | ICD-10-CM

## 2014-05-07 DIAGNOSIS — Z7982 Long term (current) use of aspirin: Secondary | ICD-10-CM | POA: Insufficient documentation

## 2014-05-07 DIAGNOSIS — I10 Essential (primary) hypertension: Secondary | ICD-10-CM | POA: Insufficient documentation

## 2014-05-07 DIAGNOSIS — E785 Hyperlipidemia, unspecified: Secondary | ICD-10-CM

## 2014-05-07 DIAGNOSIS — I428 Other cardiomyopathies: Secondary | ICD-10-CM | POA: Insufficient documentation

## 2014-05-07 DIAGNOSIS — E119 Type 2 diabetes mellitus without complications: Secondary | ICD-10-CM | POA: Insufficient documentation

## 2014-05-07 DIAGNOSIS — J45909 Unspecified asthma, uncomplicated: Secondary | ICD-10-CM | POA: Insufficient documentation

## 2014-05-07 DIAGNOSIS — M129 Arthropathy, unspecified: Secondary | ICD-10-CM | POA: Insufficient documentation

## 2014-05-07 DIAGNOSIS — Z87891 Personal history of nicotine dependence: Secondary | ICD-10-CM | POA: Insufficient documentation

## 2014-05-07 HISTORY — DX: Unspecified atrial flutter: I48.92

## 2014-05-07 HISTORY — PX: ABLATION: SHX5711

## 2014-05-07 HISTORY — PX: ATRIAL FLUTTER ABLATION: SHX5733

## 2014-05-07 LAB — GLUCOSE, CAPILLARY
GLUCOSE-CAPILLARY: 136 mg/dL — AB (ref 70–99)
GLUCOSE-CAPILLARY: 240 mg/dL — AB (ref 70–99)
GLUCOSE-CAPILLARY: 248 mg/dL — AB (ref 70–99)
Glucose-Capillary: 137 mg/dL — ABNORMAL HIGH (ref 70–99)

## 2014-05-07 SURGERY — ATRIAL FLUTTER ABLATION
Anesthesia: General

## 2014-05-07 MED ORDER — LOSARTAN POTASSIUM 50 MG PO TABS: 100.0000 mg | ORAL_TABLET | Freq: Every day | ORAL | Status: DC

## 2014-05-07 MED ORDER — TAMSULOSIN HCL 0.4 MG PO CAPS
0.4000 mg | ORAL_CAPSULE | Freq: Every day | ORAL | Status: DC
  Administered 2014-05-08: 0.4 mg via ORAL
  Filled 2014-05-07: qty 1

## 2014-05-07 MED ORDER — LOSARTAN POTASSIUM 50 MG PO TABS
100.0000 mg | ORAL_TABLET | Freq: Every day | ORAL | Status: DC
  Administered 2014-05-08: 100 mg via ORAL
  Filled 2014-05-07 (×2): qty 2

## 2014-05-07 MED ORDER — SODIUM CHLORIDE 0.9 % IV SOLN: 250.0000 mL | INTRAVENOUS | Status: DC | PRN

## 2014-05-07 MED ORDER — ONDANSETRON HCL 4 MG/2ML IJ SOLN: 4.0000 mg | Freq: Four times a day (QID) | INTRAMUSCULAR | Status: DC | PRN

## 2014-05-07 MED ORDER — DEXAMETHASONE SODIUM PHOSPHATE 4 MG/ML IJ SOLN
INTRAMUSCULAR | Status: DC | PRN
  Administered 2014-05-07: 4 mg via INTRAVENOUS

## 2014-05-07 MED ORDER — INSULIN ASPART 100 UNIT/ML ~~LOC~~ SOLN
0.0000 [IU] | Freq: Every day | SUBCUTANEOUS | Status: DC
  Administered 2014-05-07: 2 [IU] via SUBCUTANEOUS

## 2014-05-07 MED ORDER — EPHEDRINE SULFATE 50 MG/ML IJ SOLN
INTRAMUSCULAR | Status: DC | PRN
  Administered 2014-05-07: 5 mg via INTRAVENOUS
  Administered 2014-05-07 (×2): 10 mg via INTRAVENOUS

## 2014-05-07 MED ORDER — MIDAZOLAM HCL 5 MG/5ML IJ SOLN
INTRAMUSCULAR | Status: DC | PRN
  Administered 2014-05-07: 2 mg via INTRAVENOUS

## 2014-05-07 MED ORDER — BUPIVACAINE HCL (PF) 0.25 % IJ SOLN
INTRAMUSCULAR | Status: AC
  Filled 2014-05-07: qty 60

## 2014-05-07 MED ORDER — ONDANSETRON HCL 4 MG/2ML IJ SOLN
INTRAMUSCULAR | Status: DC | PRN
  Administered 2014-05-07: 4 mg via INTRAVENOUS

## 2014-05-07 MED ORDER — LIDOCAINE HCL (CARDIAC) 20 MG/ML IV SOLN
INTRAVENOUS | Status: DC | PRN
  Administered 2014-05-07 (×2): 80 mg via INTRAVENOUS

## 2014-05-07 MED ORDER — MODAFINIL 200 MG PO TABS: 200.0000 mg | ORAL_TABLET | Freq: Every day | ORAL | Status: DC

## 2014-05-07 MED ORDER — TIMOLOL MALEATE 0.5 % OP SOLN
1.0000 [drp] | Freq: Every day | OPHTHALMIC | Status: DC
  Administered 2014-05-07: 1 [drp] via OPHTHALMIC
  Filled 2014-05-07: qty 5

## 2014-05-07 MED ORDER — FENTANYL CITRATE 0.05 MG/ML IJ SOLN
INTRAMUSCULAR | Status: DC | PRN
  Administered 2014-05-07 (×2): 50 ug via INTRAVENOUS

## 2014-05-07 MED ORDER — PHENYLEPHRINE HCL 10 MG/ML IJ SOLN
INTRAMUSCULAR | Status: DC | PRN
  Administered 2014-05-07 (×3): 80 ug via INTRAVENOUS

## 2014-05-07 MED ORDER — FENOFIBRATE 160 MG PO TABS
160.0000 mg | ORAL_TABLET | Freq: Every day | ORAL | Status: DC
  Administered 2014-05-08: 160 mg via ORAL
  Filled 2014-05-07: qty 1

## 2014-05-07 MED ORDER — NEBIVOLOL HCL 10 MG PO TABS
10.0000 mg | ORAL_TABLET | Freq: Every day | ORAL | Status: DC
  Administered 2014-05-08: 10 mg via ORAL
  Filled 2014-05-07: qty 1

## 2014-05-07 MED ORDER — FENTANYL CITRATE 0.05 MG/ML IJ SOLN: 25.0000 ug | INTRAMUSCULAR | Status: DC | PRN

## 2014-05-07 MED ORDER — OXYCODONE HCL 5 MG PO TABS: 5.0000 mg | ORAL_TABLET | Freq: Once | ORAL | Status: DC | PRN

## 2014-05-07 MED ORDER — INSULIN ASPART 100 UNIT/ML ~~LOC~~ SOLN: 0.0000 [IU] | Freq: Three times a day (TID) | SUBCUTANEOUS | Status: DC

## 2014-05-07 MED ORDER — TIMOLOL HEMIHYDRATE 0.5 % OP SOLN: 1.0000 [drp] | Freq: Every day | OPHTHALMIC | Status: DC

## 2014-05-07 MED ORDER — MODAFINIL 100 MG PO TABS
200.0000 mg | ORAL_TABLET | Freq: Every day | ORAL | Status: DC
  Administered 2014-05-08: 200 mg via ORAL
  Filled 2014-05-07: qty 2

## 2014-05-07 MED ORDER — ACETAMINOPHEN 325 MG PO TABS: 650.0000 mg | ORAL_TABLET | ORAL | Status: DC | PRN

## 2014-05-07 MED ORDER — SODIUM CHLORIDE 0.9 % IV SOLN: INTRAVENOUS | Status: AC

## 2014-05-07 MED ORDER — SODIUM CHLORIDE 0.9 % IJ SOLN: 3.0000 mL | INTRAMUSCULAR | Status: DC | PRN

## 2014-05-07 MED ORDER — APIXABAN 5 MG PO TABS
5.0000 mg | ORAL_TABLET | Freq: Two times a day (BID) | ORAL | Status: DC
  Administered 2014-05-07 – 2014-05-08 (×2): 5 mg via ORAL
  Filled 2014-05-07 (×3): qty 1

## 2014-05-07 MED ORDER — OXYCODONE HCL 5 MG/5ML PO SOLN: 5.0000 mg | Freq: Once | ORAL | Status: DC | PRN

## 2014-05-07 MED ORDER — SODIUM CHLORIDE 0.9 % IJ SOLN
3.0000 mL | Freq: Two times a day (BID) | INTRAMUSCULAR | Status: DC
  Administered 2014-05-07: 3 mL via INTRAVENOUS

## 2014-05-07 MED ORDER — ATORVASTATIN CALCIUM 10 MG PO TABS
10.0000 mg | ORAL_TABLET | Freq: Every day | ORAL | Status: DC
  Filled 2014-05-07 (×2): qty 1

## 2014-05-07 MED ORDER — PROPOFOL 10 MG/ML IV BOLUS
INTRAVENOUS | Status: DC | PRN
  Administered 2014-05-07: 150 mg via INTRAVENOUS

## 2014-05-07 NOTE — Transfer of Care (Signed)
Immediate Anesthesia Transfer of Care Note  Patient: Tommy Sullivan  Procedure(s) Performed: Procedure(s): ATRIAL FLUTTER ABLATION (N/A)  Patient Location: PACU  Anesthesia Type:General  Level of Consciousness: awake, alert , oriented and patient cooperative  Airway & Oxygen Therapy: Patient Spontanous Breathing and Patient connected to nasal cannula oxygen  Post-op Assessment: Report given to PACU RN, Post -op Vital signs reviewed and stable and Patient moving all extremities  Post vital signs: Reviewed and stable  Complications: No apparent anesthesia complications

## 2014-05-07 NOTE — Progress Notes (Signed)
Site area: right groin 2 venous sheaths removed  Site Prior to Removal:  Level 0 Pressure Applied For: 15 minutes Manual:   yes Patient Status During Pull:  No complications  Post Pull Site:  Level 0 Post Pull Instructions Given:  yes Post Pull Pulses Present: DP 2+ Dressing Applied:  tegaderm Bedrest begins @ 1230 Comments: bilateral groins have sheaths   Site area: left groin 2 venous sheaths removed Site Prior to Removal:  Level 0 Pressure Applied For: 15 minutes Manual:   yes Patient Status During Pull:  No complications  Post Pull Site:  Level 0 Post Pull Instructions Given:  Yes  Post Pull Pulses Present: Dp 2+ Dressing Applied:  tegaderm  Bedrest begins @ 1230 Comments: pt sipping water

## 2014-05-07 NOTE — Anesthesia Preprocedure Evaluation (Signed)
Anesthesia Evaluation  Patient identified by MRN, date of birth, ID band Patient awake    Reviewed: Allergy & Precautions, H&P , NPO status , Patient's Chart, lab work & pertinent test results  Airway Mallampati: II  Neck ROM: full    Dental   Pulmonary shortness of breath, asthma , sleep apnea , former smoker,          Cardiovascular hypertension, + CAD and + Past MI + dysrhythmias Atrial Fibrillation     Neuro/Psych  Neuromuscular disease    GI/Hepatic   Endo/Other  diabetes, Type 2obese  Renal/GU      Musculoskeletal  (+) Arthritis -,   Abdominal   Peds  Hematology   Anesthesia Other Findings   Reproductive/Obstetrics                           Anesthesia Physical Anesthesia Plan  ASA: III  Anesthesia Plan: General   Post-op Pain Management:    Induction: Intravenous  Airway Management Planned: LMA  Additional Equipment:   Intra-op Plan:   Post-operative Plan:   Informed Consent: I have reviewed the patients History and Physical, chart, labs and discussed the procedure including the risks, benefits and alternatives for the proposed anesthesia with the patient or authorized representative who has indicated his/her understanding and acceptance.     Plan Discussed with: CRNA, Anesthesiologist and Surgeon  Anesthesia Plan Comments:         Anesthesia Quick Evaluation

## 2014-05-07 NOTE — Anesthesia Postprocedure Evaluation (Signed)
Anesthesia Post Note  Patient: Tommy Sullivan  Procedure(s) Performed: Procedure(s) (LRB): ATRIAL FLUTTER ABLATION (N/A)  Anesthesia type: General  Patient location: PACU  Post pain: Pain level controlled and Adequate analgesia  Post assessment: Post-op Vital signs reviewed, Patient's Cardiovascular Status Stable, Respiratory Function Stable, Patent Airway and Pain level controlled  Last Vitals:  Filed Vitals:   05/07/14 1430  BP: 100/68  Pulse: 88  Temp:   Resp: 17    Post vital signs: Reviewed and stable  Level of consciousness: awake, alert  and oriented  Complications: No apparent anesthesia complications

## 2014-05-07 NOTE — CV Procedure (Signed)
Tommy Sullivan [id number]  [id number]  Preop JW:LKHVFM flutter Postop Dx sinus rhythm  Procedure: EPS entrainement mapping, catheter ablation  Cx: None   Dictation number [id number]  Virl Axe, MD 05/07/2014 11:28 AM

## 2014-05-07 NOTE — H&P (View-Only) (Signed)
ELECTROPHYSIOLOGY CONSULT NOTE  Patient ID: Tommy Sullivan, MRN: [id number], DOB/AGE: 1945/10/17 68 y.o. Admit date: (Not on file) Date of Consult: 04/17/2014  Primary Physician: Eliezer Lofts, MD Primary Cardiologist: MA  Chief Complaint: Atrial flutter   HPI Tommy Sullivan is a 68 y.o. male  Referred for atrial flutter.  This is identified on a preoperative cardiovascular assessment. He apparently had been previously told he had some kind of tachyarrhythmia. He had no treatable symptoms.  Thromboembolic risk factors are notable for hypertension, diabetes, age, coronary disease for a CHADS-VASc score of 4  Echocardiogram 7/15 demonstrated ejection fraction of 40-45% with global hypokinesis. Left atrial size was normal. Myoview scanning demonstrated evidence of a prior infero-/anterolateral scar  without   ischemia.   Past Medical History  Diagnosis Date  . Hyperlipidemia   . Hypertension   . Allergy   . Glaucoma   . Retinal vascular occlusion, unspecified     Right  . Extrinsic asthma, unspecified   . Coronary atherosclerosis of unspecified type of vessel, native or graft   . Gout   . Shortness of breath   . Diabetes mellitus without complication   . Arthritis   . Heart murmur   . Myocardial infarction 1/09    PCI done by Dr. Collene Mares in [place]  . Dysrhythmia     "hx HR speeding up at times"      Surgical History:  Past Surgical History  Procedure Laterality Date  . Tonsillectomy    . Appendectomy  1954  . Neck surgery  1990's    x2, Fusion  . Colonoscopy    . Coronary angioplasty  2009    stent [place]  . Shoulder arthroscopy with subacromial decompression Left 04/02/2014    Procedure: SHOULDER ARTHROSCOPY WITH SUBACROMIAL DECOMPRESSION WITH ROTATOR CUFF REPAIR;  Surgeon: Nita Sells, MD;  Location: [facility];  Service: Orthopedics;  Laterality: Left;  Left shoulder rotator cuff repair, subacromial decompression     Home  Meds: Prior to Admission medications   Medication Sig Start Date End Date Taking? Authorizing Provider  aspirin 81 MG tablet Take 81 mg by mouth daily.     Yes Historical Provider, MD  Cholecalciferol (VITAMIN D3) 2000 UNITS TABS Take 2,000 Units by mouth daily.    Yes Historical Provider, MD  fenofibrate 160 MG tablet Take 160 mg by mouth daily.   Yes Historical Provider, MD  fish oil-omega-3 fatty acids 1000 MG capsule Take 2 g by mouth 2 (two) times daily.    Yes Historical Provider, MD  glucose blood (TRUETEST TEST) test strip Use as instructed to test blood sugar 1-2 times daily dx: 250.02 05/06/13  Yes Amy Cletis Athens, MD  Lancets (FREESTYLE) lancets 1 each by Other route 2 (two) times daily. Use as instructed    Yes Historical Provider, MD  losartan (COZAAR) 100 MG tablet Take 100 mg by mouth daily.   Yes Historical Provider, MD  modafinil (PROVIGIL) 200 MG tablet Take 1 tablet (200 mg total) by mouth daily. 10/27/13  Yes Kathee Delton, MD  nebivolol (BYSTOLIC) 10 MG tablet Take 1 tablet (10 mg total) by mouth daily. 05/06/13  Yes Amy Cletis Athens, MD  rosuvastatin (CRESTOR) 10 MG tablet Take 10 mg by mouth at bedtime.   Yes Historical Provider, MD  tamsulosin (FLOMAX) 0.4 MG CAPS capsule Take 0.4 mg by mouth daily.   Yes Historical Provider, MD  timolol (BETIMOL) 0.5 % ophthalmic solution Place 1 drop into  both eyes at bedtime.   Yes Historical Provider, MD      Allergies: No Known Allergies  History   Social History  . Marital Status: Married    Spouse Name: N/A    Number of Children: N/A  . Years of Education: N/A   Occupational History  . retired    Social History Main Topics  . Smoking status: Former Smoker -- 1.00 packs/day for 20 years    Types: Cigarettes    Quit date: 10/02/1988  . Smokeless tobacco: Never Used     Comment: quit in the 1990's  . Alcohol Use: 1.8 oz/week    3 Cans of beer per week     Comment: 3-4 drinks per week.   . Drug Use: No  . Sexual Activity:  Not on file   Other Topics Concern  . Not on file   Social History Narrative  . No narrative on file     Family History  Problem Relation Age of Onset  . Diabetes Sister   . Coronary artery disease Brother   . AAA (abdominal aortic aneurysm) Father   . Heart disease Mother   . Diabetes Mother   . Diabetes Brother      ROS:  Please see the history of present illness.     All other systems reviewed and negative.    Physical Exam: Blood pressure 140/97, pulse 90, height 5' 10.5" (1.791 m), weight 238 lb (107.956 kg). General: Well developed, well nourished male in no acute distress. Head: Normocephalic, atraumatic, sclera non-icteric, no xanthomas, nares are without discharge. EENT: normal Lymph Nodes:  none Back: without scoliosis/kyphosis, no CVA tendersness Neck: Negative for carotid bruits. JVD not elevated. Lungs: Clear bilaterally to auscultation without wheezes, rales, or rhonchi. Breathing is unlabored. Heart: Rapid but RRR with S1 S2.   2/6 systolic  murmur , rubs, or gallops appreciated. Abdomen: Soft, non-tender, non-distended with normoactive bowel sounds. No hepatomegaly. No rebound/guarding. No obvious abdominal masses. Msk:  Strength and tone appear normal for age. Extremities: No clubbing or cyanosis. No edema.  Distal pedal pulses are 2+ and equal bilaterally. Skin: Warm and Dry Neuro: Alert and oriented X 3. CN III-XII intact Grossly normal sensory and motor function . Left eye blindness Psych:  Responds to questions appropriately with a normal affect.      Labs: Cardiac Enzymes No results found for this basename: CKTOTAL, CKMB, TROPONINI,  in the last 72 hours CBC Lab Results  Component Value Date   WBC 7.8 03/19/2014   HGB 14.3 03/19/2014   HCT 41.6 03/19/2014   MCV 91.0 03/19/2014   PLT 165 03/19/2014   PROTIME: No results found for this basename: LABPROT, INR,  in the last 72 hours Chemistry No results found for this basename: NA, K, CL, CO2, BUN,  CREATININE, CALCIUM, LABALBU, PROT, BILITOT, ALKPHOS, ALT, AST, GLUCOSE,  in the last 168 hours Lipids Lab Results  Component Value Date   CHOL 121 12/03/2013   HDL 32.60* 12/03/2013   LDLCALC 55 12/03/2013   TRIG 166.0* 12/03/2013   BNP No results found for this basename: probnp   Miscellaneous No results found for this basename: DDIMER    Radiology/Studies:  No results found.   EKG: *Atrial flutter- typical Intervals-/10/42  Assessment and Plan:  Atrial flutter  Cardiomyopathy  Retinal vein occlusion  CAD  Prior MI   The patient has atrial flutter without palpitations. He has significant fatigue which may be attributable to the flutter and has a  cardiomyopathy  He recently underwent shoulder surgery but is now allowed to move his arm.  Discussed strategies for management of atrial flutter including cardioversion and catheter ablation. We discussed these in the context of the likelihood of recurrence and also the associated risk of atrial fibrillation in the range of 30-50%  We will plan to initiate anticoagulation and anticipate anesthesia supported catheter ablation of atrial flutter in about 3 weeks time. Would anticipate thereafter the discontinuation of his anticoagulant about 4 weeks later. Retinal vein occlusion is not a cardioembolic event  We discussed potential benefits and risks of catheter ablation.    Virl Axe

## 2014-05-07 NOTE — Op Note (Signed)
NAMESANTOSH, PETTER NO.:  [id number]  MEDICAL RECORD NO.:  [id number]  LOCATION:  [facility]                         FACILITY:  [facility]  PHYSICIAN:  Deboraha Sprang, MD, FACCDATE OF BIRTH:  [birth year]  DATE OF PROCEDURE:  05/07/2014 DATE OF DISCHARGE:                              OPERATIVE REPORT   PREOPERATIVE DIAGNOSIS:  Atrial flutter.  POSTOPERATIVE DIAGNOSIS:  Sinus rhythm.  PROCEDURES:  Invasive electrophysiological study, arrhythmia mapping, and catheter ablation.  SURGEON:  Deboraha Sprang, MD, [facility].  PROCEDURE IN DETAIL:  After obtaining informed consent, the patient was brought to Electrophysiology Laboratory and placed on the fluoroscopic table in supine position.  After routine prep and drape, the patient was submitted to general anesthesia.  Local anesthesia was used adjunctively.  Following the procedure, the catheters were removed. Hemostasis was obtained.  The patient was transferred to the holding area following extubation.  CATHETERS:  A 5-French quadripolar catheter was inserted via left femoral vein to the AV junction to measure the His.  A 6-French octapolar catheter was inserted via the right femoral vein to the coronary sinus.  A 7-French dual decapolar catheter was inserted via left femoral vein to the tricuspid anulus.  An 8-French 10 mm deflectable tip ablation catheter was inserted via an SAFL sheath in the right femoral vein to mapping sites in the posterior septal space.  Surface leads 1, aVF and V1 were monitored continuously throughout the procedure.  Following insertion of the catheters, a stimulation protocol included, 1. Entrainment mapping from the tricuspid anulus. 2. Incremental atrial pacing. 3. Single atrial extrastimuli at 650 milliseconds. 4. Ventricular burst pacing.  END-TIDAL RESULTS:  END-TIDAL SURFACE ELECTROCARDIOGRAM AND BASIC INTERVALS: 1. Rhythm: Atrial flutter; AA interval 283 milliseconds, RR interval    821 milliseconds; QRS duration 117 milliseconds; QT interval 424     milliseconds; AH interval N/A; HV interval 58 milliseconds. 2. Final rhythm:  Sinus; RR interval 914 milliseconds; P-wave duration     164 milliseconds; PR interval 239 milliseconds; QRS duration 120     milliseconds; acute interval 405 milliseconds; AH interval 94     milliseconds; HV interval 59 seconds.  END-TIDAL AV NODAL FUNCTION: 1. AV nodal anterograde Wenckebach was 500 milliseconds. 2. VA conduction was dissociated at 600 milliseconds. 3. AV nodal effective refractory period at 650 milliseconds was 440     milliseconds. 4. AV nodal conduction was continuous without evidence of dual AV     nodal physiology.  ACCESSORY PATHWAY FUNCTION:  No evidence of an accessory pathway was identified.  VENTRICULAR RESPONSE PROGRAMED STIMULATION:  Normal for ventricular stimulation as described.  ARRHYTHMIAS INDUCED:  The patient presented to the lab in atrial flutter.  Entrainment mapping along the cavotricuspid isthmus demonstrated cavotricuspid isthmus dependence.  RADIOFREQUENCY ENERGY:  Radiofrequency energy was delivered across the cavotricuspid isthmus.  A total of 9 minutes and 50 seconds of RF energy was applied resulting first in termination of atrial flutter at a posterior location and then bidirectional isthmus block at an anterior location with an A1-A2 interval of 160 milliseconds.  FLUOROSCOPY TIME:  A total of 8.9 minutes of fluoroscopy time was utilized.  IMPRESSION: 1. Normal sinus function. 2. Abnormal atrial  function manifested by sustained atrial flutter. 3. Abnormal AV nodal function with prolonged AV Wenckebach cycle     length. 4. Normal His-Purkinje system function. 5. No accessory pathway. 6. Normal ventricular response to programed stimulation.  SUMMARY:  In conclusion, the results of electrophysiological testing confirmed cavotricuspid isthmus dependent atrial flutter.  Catheter  ablation across the cavotricuspid isthmus resulted in termination of tachycardia and the generation of bidirectional isthmus block with an A1-A2 interval of 160 milliseconds.  The patient tolerated the procedure without apparent complication.     Deboraha Sprang, MD, [facility]     SCK/MEDQ  D:  05/07/2014  T:  05/07/2014  Job:  [id number]

## 2014-05-07 NOTE — Telephone Encounter (Signed)
Eliquis samples given to wife earlier today, per Dr. Caryl Comes request.

## 2014-05-07 NOTE — Discharge Summary (Signed)
ELECTROPHYSIOLOGY PROCEDURE DISCHARGE SUMMARY    Patient ID: Tommy Sullivan,  MRN: [id number], DOB/AGE: [birth year] 68 y.o.  Admit date: 05/07/2014 Discharge date: 05/08/2014  Primary Care Physician: Eliezer Lofts, MD Primary Cardiologist: Fletcher Anon Electrophysiologist: Caryl Comes  Primary Discharge Diagnosis:  Atrial flutter status post ablation this admission  Secondary Discharge Diagnosis:  1.  Hyperlipidemia 2.  Hypertension 3.  CAD 4.  Diabetes 5.  Asthma  No Known Allergies   Procedures This Admission:  1.  Electrophysiology study and radiofrequency catheter ablation of atrial flutter on 05-07-2014 by Dr Caryl Comes.  See op note for full details.  There were no early apparent complications.    Brief HPI: Jaimes Eckert is a 68 y.o. male with a past medical history as outlined above.  He was found to have atrial flutter on preoperative risk assessment.   He had developed fatigue and EF was noted to be 45%.  He was referred to EP for treatment options.  Risks, benefits, and alternatives to ablation were reviewed with the patient who wished to proceed.   Hospital Course:  The patient was admitted and underwent ablation of atrial flutter with details as outlined in Dr Olin Pia op note.  He was monitored on telemetry overnight which demonstrated sinus rhythm.  His groin was without complication.  He was evaluated by Dr Caryl Comes and considered stable for discharge to home.   Discharge Vitals: Blood pressure 104/70, pulse 65, temperature 97.6 F (36.4 C), temperature source Oral, resp. rate 16, height 5\' 11"  (1.803 m), weight 240 lb 11.9 oz (109.2 kg), SpO2 99.00%.   Well developed and nourished in no acute distress HENT normal Neck supple with JVP-flat Clear Groin ok Regular rate and rhythm, no murmurs or gallops Groins ok Abd-soft with active BS No Clubbing cyanosis edema Skin-warm and dry A & Oriented  Grossly normal sensory and motor function   Labs:   Lab Results  Component  Value Date   WBC 7.3 05/05/2014   HGB 12.8* 05/05/2014   HCT 37.8* 05/05/2014   MCV 92.6 05/05/2014   PLT 153.0 05/05/2014     Recent Labs Lab 05/05/14 1453  NA 135  K 3.9  CL 99  CO2 27  BUN 15  CREATININE 1.1  CALCIUM 9.5  GLUCOSE 135*     Discharge Medications:    Medication List    ASK your doctor about these medications       ELIQUIS 5 MG Tabs tablet  Generic drug:  apixaban  Take 5 mg by mouth 2 (two) times daily.     fenofibrate 160 MG tablet  Take 160 mg by mouth daily.     fish oil-omega-3 fatty acids 1000 MG capsule  Take 2 g by mouth 2 (two) times daily.     freestyle lancets  1 each by Other route See admin instructions. Check blood sugar twice daily.     losartan 100 MG tablet  Commonly known as:  COZAAR  Take 100 mg by mouth daily.     modafinil 200 MG tablet  Commonly known as:  PROVIGIL  Take 200 mg by mouth daily.     nebivolol 10 MG tablet  Commonly known as:  BYSTOLIC  Take 1 tablet (10 mg total) by mouth daily.     NON FORMULARY  1 each by Other route See admin instructions. Use CPAP nightly.     rosuvastatin 10 MG tablet  Commonly known as:  CRESTOR  Take 10 mg by mouth daily.  tamsulosin 0.4 MG Caps capsule  Commonly known as:  FLOMAX  Take 0.4 mg by mouth daily.     timolol 0.5 % ophthalmic solution  Commonly known as:  BETIMOL  Place 1 drop into both eyes at bedtime.     TRUETEST TEST test strip  Generic drug:  glucose blood  1 each by Other route See admin instructions. Check blood sugar twice daily.     Vitamin D3 2000 UNITS Tabs  Take 2,000 Units by mouth daily.        Disposition:   continue apixoban for 4 weeks  [place]  [place] 6-8 weeks   Duration of Discharge Encounter: Greater than 30 minutes including physician time.  Signed,

## 2014-05-07 NOTE — Interval H&P Note (Signed)
History and Physical Interval Note:  05/07/2014 10:27 AM  [name]  has presented today for surgery, with the diagnosis of flutter  The various methods of treatment have been discussed with the patient and family. After consideration of risks, benefits and other options for treatment, the patient has consented to  Procedure(s): ATRIAL FLUTTER ABLATION (N/A) as a surgical intervention .  The patient's history has been reviewed, patient examined, no change in status, stable for surgery.  I have reviewed the patient's chart and labs.  Questions were answered to the patient's satisfaction.     Tommy Sullivan

## 2014-05-08 ENCOUNTER — Telehealth: Payer: Self-pay | Admitting: *Deleted

## 2014-05-08 DIAGNOSIS — I4892 Unspecified atrial flutter: Secondary | ICD-10-CM

## 2014-05-08 LAB — GLUCOSE, CAPILLARY: Glucose-Capillary: 139 mg/dL — ABNORMAL HIGH (ref 70–99)

## 2014-05-08 NOTE — Telephone Encounter (Signed)
Patient called and Dr. Caryl Comes was looking for medication.

## 2014-05-08 NOTE — Discharge Instructions (Signed)
AblationCardiac Ablation  Cardiac ablation is a procedure to stop some heart tissue from causing problems. The heart has many electrical connections. Sometimes these connections cause the heart to beat very fast or irregularly. Removing some of the problem areas can improve heart rhythm or make it normal. Ablation is done for people who:   Have Wolff-Parkinson-White syndrome.  Have other fast heart rhythms (tachycardia).  Have taken medicines for an abnormal heart rhythm (arrhythmia) and the medicines had:  No success.  Side effects.  May have a type of heartbeat that could cause death. BEFORE THE PROCEDURE   Follow instructions from your doctor about eating and drinking before the procedure.  Take your medicines as told by your doctor. Take them at regular times with water unless told differently by your doctor.  If you are taking diabetes medicine, ask your doctor how to take it. Ask if there are any special instructions you should follow. Your doctor may change how much insulin you take the day of the procedure. PROCEDURE   A special type of X-ray will be used. The X-ray helps your doctor see images of your heart during the procedure.  A small cut (incision) will be made in your neck or groin.  An IV tube will be started before the procedure begins.  You will be given a numbing medicine (anesthetic) or a medicine to help you relax (sedative).  The skin on your neck or groin will be numbed.  A needle will be put into a large vein in your neck or groin.  A thin, flexible tube (catheter) will be put in to reach your heart.  A dye will be put in the tube. The dye will show up on X-rays. It will help your doctor see the area of the heart that needs treatment.  When the heart tissue that is causing problems is found, the tip of the tube will send an electrical current to it. This will stop it from causing problems.  The tube will be taken out.  Pressure will be put on the area  where the tube was. This will keep it from bleeding. A bandage will be placed over the area. AFTER THE PROCEDURE  You will be taken to a recovery area. Your blood pressure, heart rate, and breathing will be watched. The area where the tube was will also be watched for bleeding.  You will need to lie still for 4-6 hours. This keeps the area where the tube was from bleeding. Document Released: 05/21/2013 Document Revised: 02/02/2014 Document Reviewed: 05/21/2013 [facility] Patient Information 2015 [place], [place]. This information is not intended to replace advice given to you by your health care provider. Make sure you discuss any questions you have with your health care provider.     Care After Refer to this sheet in the next few weeks. These instructions provide you with information on caring for yourself after your procedure. Your health care provider may also give you more specific instructions. Your treatment has been planned according to current medical practices, but problems sometimes occur. Call your health care provider if you have any problems or questions after your procedure.  WHAT TO EXPECT AFTER THE PROCEDURE After your procedure, it is typical to have the following sensations:  Minor discomfort or tenderness and a small bump at the catheter insertion site. The bump should usually decrease in size and tenderness within 1 to 2 weeks.  Any bruising will usually fade within 2 to 4 weeks. HOME CARE INSTRUCTIONS  You may need to keep taking blood thinners if they were prescribed for you. Take medicines only as directed by your health care provider.  Do not apply powder or lotion to the site.  Do not take baths, swim, or use a hot tub until your health care provider approves.  You may shower 24 hours after the procedure. Remove the bandage (dressing) and gently wash the site with plain soap and water. Gently pat the site dry.  Inspect the site at least twice daily.  Limit your  activity for the first 48 hours. Do not bend, squat, or lift anything over 20 lb (9 kg) or as directed by your health care provider.  Plan to have someone take you home after the procedure. Follow instructions about when you can drive or return to work. SEEK MEDICAL CARE IF:  You get light-headed when standing up.  You have drainage (other than a small amount of blood on the dressing).  You have chills.  You have a fever.  You have redness, warmth, swelling, or pain at the insertion site. SEEK IMMEDIATE MEDICAL CARE IF:   You develop chest pain or shortness of breath, feel faint, or pass out.  You have bleeding, swelling larger than a walnut, or drainage from the catheter insertion site.  You develop pain, discoloration, coldness, or severe bruising in the leg or arm that held the catheter.  You develop bleeding from any other place, such as the bowels. You may see bright red blood in your urine or stools, or your stools may appear black and tarry.  You have heavy bleeding from the site. If this happens, hold pressure on the site. MAKE SURE YOU:  Understand these instructions.  Will watch your condition.  Will get help right away if you are not doing well or get worse. Document Released: 04/06/2005 Document Revised: 02/02/2014 Document Reviewed: 02/10/2013 [facility] Patient Information 2015 [place], [place]. This information is not intended to replace advice given to you by your health care provider. Make sure you discuss any questions you have with your health care provider.

## 2014-05-08 NOTE — Telephone Encounter (Signed)
Eliquis samples placed at from desk for patient  Patient aware

## 2014-05-28 ENCOUNTER — Other Ambulatory Visit: Payer: Self-pay | Admitting: Family Medicine

## 2014-06-10 ENCOUNTER — Encounter: Payer: Self-pay | Admitting: Orthopedic Surgery

## 2014-07-02 ENCOUNTER — Encounter: Payer: Self-pay | Admitting: Orthopedic Surgery

## 2014-07-06 ENCOUNTER — Telehealth: Payer: Self-pay | Admitting: Family Medicine

## 2014-07-06 DIAGNOSIS — M1A9XX0 Chronic gout, unspecified, without tophus (tophi): Secondary | ICD-10-CM

## 2014-07-06 DIAGNOSIS — E785 Hyperlipidemia, unspecified: Secondary | ICD-10-CM

## 2014-07-06 DIAGNOSIS — IMO0002 Reserved for concepts with insufficient information to code with codable children: Secondary | ICD-10-CM

## 2014-07-06 DIAGNOSIS — E1165 Type 2 diabetes mellitus with hyperglycemia: Secondary | ICD-10-CM

## 2014-07-06 NOTE — Telephone Encounter (Signed)
Message copied by Jinny Sanders on Mon Jul 06, 2014 10:44 PM ------      Message from: Ellamae Sia      Created: Wed Jul 01, 2014  5:32 PM      Regarding: Lab orders for Tuesday, 10.6.15       Lab orders for a 3 month f/u ------

## 2014-07-07 ENCOUNTER — Encounter: Payer: Self-pay | Admitting: Internal Medicine

## 2014-07-07 ENCOUNTER — Ambulatory Visit (INDEPENDENT_AMBULATORY_CARE_PROVIDER_SITE_OTHER): Payer: 59 | Admitting: Internal Medicine

## 2014-07-07 ENCOUNTER — Telehealth: Payer: Self-pay | Admitting: Family Medicine

## 2014-07-07 ENCOUNTER — Other Ambulatory Visit (INDEPENDENT_AMBULATORY_CARE_PROVIDER_SITE_OTHER): Payer: 59

## 2014-07-07 VITALS — BP 118/60 | HR 56 | Ht 70.5 in | Wt 234.5 lb

## 2014-07-07 DIAGNOSIS — I255 Ischemic cardiomyopathy: Secondary | ICD-10-CM

## 2014-07-07 DIAGNOSIS — R002 Palpitations: Secondary | ICD-10-CM

## 2014-07-07 DIAGNOSIS — M1A9XX0 Chronic gout, unspecified, without tophus (tophi): Secondary | ICD-10-CM

## 2014-07-07 DIAGNOSIS — I4892 Unspecified atrial flutter: Secondary | ICD-10-CM

## 2014-07-07 DIAGNOSIS — Z9889 Other specified postprocedural states: Secondary | ICD-10-CM

## 2014-07-07 DIAGNOSIS — I10 Essential (primary) hypertension: Secondary | ICD-10-CM

## 2014-07-07 DIAGNOSIS — I2510 Atherosclerotic heart disease of native coronary artery without angina pectoris: Secondary | ICD-10-CM

## 2014-07-07 DIAGNOSIS — M109 Gout, unspecified: Secondary | ICD-10-CM

## 2014-07-07 DIAGNOSIS — Z8679 Personal history of other diseases of the circulatory system: Secondary | ICD-10-CM

## 2014-07-07 DIAGNOSIS — E785 Hyperlipidemia, unspecified: Secondary | ICD-10-CM

## 2014-07-07 DIAGNOSIS — E1165 Type 2 diabetes mellitus with hyperglycemia: Secondary | ICD-10-CM

## 2014-07-07 DIAGNOSIS — I2119 ST elevation (STEMI) myocardial infarction involving other coronary artery of inferior wall: Secondary | ICD-10-CM

## 2014-07-07 DIAGNOSIS — IMO0002 Reserved for concepts with insufficient information to code with codable children: Secondary | ICD-10-CM

## 2014-07-07 LAB — HEMOGLOBIN A1C: HEMOGLOBIN A1C: 6.9 % — AB (ref 4.6–6.5)

## 2014-07-07 LAB — LIPID PANEL
CHOL/HDL RATIO: 5
Cholesterol: 131 mg/dL (ref 0–200)
HDL: 28.5 mg/dL — ABNORMAL LOW (ref >39.00)
LDL Cholesterol: 74 mg/dL (ref 0–99)
NonHDL: 102.5
Triglycerides: 145 mg/dL (ref 0.0–149.0)
VLDL: 29 mg/dL (ref 0.0–40.0)

## 2014-07-07 LAB — COMPREHENSIVE METABOLIC PANEL
ALT: 19 U/L (ref 0–53)
AST: 25 U/L (ref 0–37)
Albumin: 3.9 g/dL (ref 3.5–5.2)
Alkaline Phosphatase: 21 U/L — ABNORMAL LOW (ref 39–117)
BUN: 17 mg/dL (ref 6–23)
CO2: 27 meq/L (ref 19–32)
Calcium: 9.3 mg/dL (ref 8.4–10.5)
Chloride: 104 mEq/L (ref 96–112)
Creatinine, Ser: 1.1 mg/dL (ref 0.4–1.5)
GFR: 74.54 mL/min (ref >60.00)
GLUCOSE: 132 mg/dL — AB (ref 70–99)
POTASSIUM: 4 meq/L (ref 3.5–5.1)
SODIUM: 139 meq/L (ref 135–145)
TOTAL PROTEIN: 6.8 g/dL (ref 6.0–8.3)
Total Bilirubin: 0.5 mg/dL (ref 0.2–1.2)

## 2014-07-07 LAB — URIC ACID: URIC ACID, SERUM: 5.6 mg/dL (ref 4.0–7.8)

## 2014-07-07 MED ORDER — GLUCOSE BLOOD VI STRP: ORAL_STRIP | Status: DC

## 2014-07-07 NOTE — Patient Instructions (Addendum)
Your physician has recommended that you wear a holter monitor. Holter monitors are medical devices that record the heart's electrical activity. Doctors most often use these monitors to diagnose arrhythmias. Arrhythmias are problems with the speed or rhythm of the heartbeat. The monitor is a small, portable device. You can wear one while you do your normal daily activities. This is usually used to diagnose what is causing palpitations/syncope (passing out).  Your physician recommends that you schedule a follow-up appointment in:  As needed with Dr. Caryl Comes   Your physician has requested that you have an echocardiogram. Echocardiography is a painless test that uses sound waves to create images of your heart. It provides your doctor with information about the size and shape of your heart and how well your heart's chambers and valves are working. This procedure takes approximately one hour. There are no restrictions for this procedure.

## 2014-07-07 NOTE — Progress Notes (Signed)
Patient Care Team: Jinny Sanders, MD as PCP - General   HPI  Tommy Sullivan is a 68 y.o. male [place] catheter ablationof atrial flutter 8/15  He ahs noted aburpt changes in heart rate on his monitor but no symptomatic tachypalps  Prev EF was 40-45% with myoview>> inferolateral scarring   Past Medical History  Diagnosis Date  . Hyperlipidemia   . Hypertension   . Allergy   . Glaucoma   . Retinal vascular occlusion, unspecified     Right  . Extrinsic asthma, unspecified   . Coronary atherosclerosis of unspecified type of vessel, native or graft   . Gout   . Diabetes mellitus without complication   . Arthritis   . Myocardial infarction 1/09    PCI done by Dr. Collene Mares in [place]  . Atrial flutter     Past Surgical History  Procedure Laterality Date  . Tonsillectomy    . Appendectomy  1954  . Neck surgery  1990's    x2, Fusion  . Colonoscopy    . Coronary angioplasty  2009    stent Whitaker  . Shoulder arthroscopy with subacromial decompression Left 04/02/2014    Procedure: SHOULDER ARTHROSCOPY WITH SUBACROMIAL DECOMPRESSION WITH ROTATOR CUFF REPAIR;  Surgeon: Nita Sells, MD;  Location: [facility];  Service: Orthopedics;  Laterality: Left;  Left shoulder rotator cuff repair, subacromial decompression  . Ablation  05-07-2014    EPS and ablation of atrial flutter by Dr Caryl Comes    Current Outpatient Prescriptions  Medication Sig Dispense Refill  . apixaban (ELIQUIS) 5 MG TABS tablet Take 5 mg by mouth 2 (two) times daily.      Marland Kitchen BYSTOLIC 10 MG tablet Take 1 tablet (10 mg total) by mouth daily.  90 tablet  1  . Cholecalciferol (VITAMIN D3) 2000 UNITS TABS Take 2,000 Units by mouth daily.       . fenofibrate 160 MG tablet TAKE 1 TABLET (160 MG TOTAL) BY MOUTH DAILY.  90 tablet  1  . fish oil-omega-3 fatty acids 1000 MG capsule Take 2 g by mouth 2 (two) times daily.       Marland Kitchen glucose blood (BAYER CONTOUR NEXT TEST) test  strip Use as instructed  100 each  12  . Lancets (FREESTYLE) lancets 1 each by Other route See admin instructions. Check blood sugar twice daily.      Marland Kitchen losartan (COZAAR) 100 MG tablet Take 100 mg by mouth daily.      . modafinil (PROVIGIL) 200 MG tablet Take 200 mg by mouth daily.      . NON FORMULARY 1 each by Other route See admin instructions. Use CPAP nightly.      . rosuvastatin (CRESTOR) 10 MG tablet Take 10 mg by mouth daily.      . tamsulosin (FLOMAX) 0.4 MG CAPS capsule TAKE 1 CAPSULE (0.4 MG TOTAL) BY MOUTH DAILY.  90 capsule  1  . timolol (BETIMOL) 0.5 % ophthalmic solution Place 1 drop into both eyes at bedtime.       No current facility-administered medications for this visit.    No Known Allergies  Review of Systems negative except from HPI and PMH  Physical Exam Ht 5' 10.5" (1.791 m)  Wt 234 lb 8 oz (106.369 kg)  BMI 33.16 kg/m2 Well developed and nourished in no acute distress HENT normal Neck supple with JVP-flat Clear Regular rate and rhythm, no murmurs or gallops Abd-soft with  active BS No Clubbing cyanosis edema Skin-warm and dry A & Oriented  Grossly normal sensory and motor function  ECG  SR @56  21/12/40 prior IMI   Assessment and  Plan  Atrial flutter  S/p ablation  Cardiomyopathy  EF 40-45%  Prior MI  Irregular heart beats   Will plan on getting 48hr holter to make sure abrupt changes n HR do not reflect Afib as this would prompt the need for ongoing anticoagulation  otherwise followup with MA  Will check echo

## 2014-07-07 NOTE — Telephone Encounter (Signed)
Pt came in today needing to get a rx for contour next test strips  Pt is out  Pt would like to pick this up this afternoon @ [facility]

## 2014-07-07 NOTE — Telephone Encounter (Signed)
Sent in rx.

## 2014-07-08 ENCOUNTER — Telehealth: Payer: Self-pay

## 2014-07-08 NOTE — Telephone Encounter (Signed)
Informed patient that [facility] will contact him about holter monitor within 7 business days   Eliquis samples left at front desk for patient to pick up

## 2014-07-08 NOTE — Telephone Encounter (Signed)
Pt called, states he was seen yesterday by Dr. Caryl Comes, and was ordered a holter monitor. Wanted [place] to know he has not received it yet. Please call.

## 2014-07-09 ENCOUNTER — Encounter: Payer: 59 | Admitting: Internal Medicine

## 2014-07-09 ENCOUNTER — Ambulatory Visit (INDEPENDENT_AMBULATORY_CARE_PROVIDER_SITE_OTHER): Payer: 59 | Admitting: Family Medicine

## 2014-07-09 ENCOUNTER — Encounter: Payer: Self-pay | Admitting: Family Medicine

## 2014-07-09 VITALS — BP 90/58 | HR 57 | Temp 98.4°F | Ht 70.5 in | Wt 232.0 lb

## 2014-07-09 DIAGNOSIS — M1A9XX0 Chronic gout, unspecified, without tophus (tophi): Secondary | ICD-10-CM

## 2014-07-09 DIAGNOSIS — Z23 Encounter for immunization: Secondary | ICD-10-CM

## 2014-07-09 DIAGNOSIS — I2510 Atherosclerotic heart disease of native coronary artery without angina pectoris: Secondary | ICD-10-CM

## 2014-07-09 DIAGNOSIS — E1165 Type 2 diabetes mellitus with hyperglycemia: Secondary | ICD-10-CM

## 2014-07-09 DIAGNOSIS — IMO0002 Reserved for concepts with insufficient information to code with codable children: Secondary | ICD-10-CM

## 2014-07-09 DIAGNOSIS — I10 Essential (primary) hypertension: Secondary | ICD-10-CM

## 2014-07-09 DIAGNOSIS — E785 Hyperlipidemia, unspecified: Secondary | ICD-10-CM

## 2014-07-09 NOTE — Progress Notes (Signed)
68 year old male with CAD, DM, gout, HTN, high chol presents for 3 month re-eval.  Had left shoulder surgery 7/2.  Dr. Tamera Punt  S/P catheter ablation of atrial flutter on 8/15 CAD: Has cardiomyopathy EF 40-45% from piror MI. Saw Dr. Caryl Comes yesterday: plans Holter monitor to eval irregular heart beats.  DM, improved control on no med. Using medications without difficulties: NOone Hypoglycemic episodes:None Hyperglycemic episodes:None Feet problems:None Blood Sugars averaging: FBS: 100-135 eye exam within last year:  Lab Results  Component Value Date   HGBA1C 6.9* 07/07/2014   Hypertension:  Well controlled on losartan, bystolic Using medication without problems or lightheadedness: None Chest pain with exertion:None Edema:None Short of breath:None Average home BPs:not checking Other issues: BP Readings from Last 3 Encounters:  07/09/14 90/58  07/07/14 118/60  05/08/14 104/70    Wt Readings from Last 3 Encounters:  07/09/14 232 lb (105.235 kg)  07/07/14 234 lb 8 oz (106.369 kg)  05/07/14 240 lb 11.9 oz (109.2 kg)     Elevated Cholesterol: LDL almost at goal < 70 with hx of CAD on crestor Lab Results  Component Value Date   CHOL 131 07/07/2014   HDL 28.50* 07/07/2014   LDLCALC 74 07/07/2014   LDLDIRECT 66.8 07/17/2012   TRIG 145.0 07/07/2014   CHOLHDL 5 07/07/2014  Using medications without problems:None Muscle aches: None Diet compliance: Great, working hard on this. Exercise: walking  Some, trying to increase as released by cardiology. Other complaints:  Gout: No flares recently, uric acid 5.6  Review of Systems  Constitutional: Negative for fever and fatigue.  HENT: Negative for ear pain.  Eyes: Negative for pain.  Respiratory: Negative for cough, shortness of breath and wheezing.  Cardiovascular: Negative for chest pain, palpitations and leg swelling.  Gastrointestinal: Negative for abdominal pain.  Objective   Physical Exam  Constitutional: Vital signs are  normal. He appears well-developed and well-nourished.  HENT:  Head: Normocephalic.  Right Ear: Hearing normal.  Left Ear: Hearing normal.  Nose: Nose normal.  Mouth/Throat: Oropharynx is clear and moist and mucous membranes are normal.  Neck: Trachea normal. Carotid bruit is not present. No mass and no thyromegaly present.  Cardiovascular: Normal rate, regular rhythm and normal pulses. Exam reveals no gallop, no distant heart sounds and no friction rub.  No murmur heard. No peripheral edema  Pulmonary/Chest: Effort normal and breath sounds normal. No respiratory distress.  Skin: Skin is warm, dry and intact. No rash noted.  Psychiatric: He has a normal mood and affect. His speech is normal and behavior is normal. Thought content normal.    Diabetic foot exam: Normal inspection No skin breakdown No calluses  Normal DP pulses Normal sensation to light touch and monofilament Nails normal

## 2014-07-09 NOTE — Assessment & Plan Note (Signed)
Well controlled. Continue current medication.  

## 2014-07-09 NOTE — Progress Notes (Signed)
Pre visit review using our clinic review tool, if applicable. No additional management support is needed unless otherwise documented below in the visit note. 

## 2014-07-09 NOTE — Assessment & Plan Note (Signed)
Excellent improvement with diet and lifestyle changes.  Encouraged continue work to get FBS < 120.

## 2014-07-09 NOTE — Assessment & Plan Note (Signed)
No recent flares, uric acid at goal.

## 2014-07-09 NOTE — Addendum Note (Signed)
Addended by: Carter Kitten on: 07/09/2014 09:01 AM   Modules accepted: Orders

## 2014-07-09 NOTE — Patient Instructions (Signed)
Keep working on healthy eating, exercise and weight loss. Fasting blood sugar goal < 120.

## 2014-07-10 ENCOUNTER — Telehealth: Payer: Self-pay | Admitting: Family Medicine

## 2014-07-10 NOTE — Telephone Encounter (Signed)
emmi mailed  °

## 2014-07-23 ENCOUNTER — Other Ambulatory Visit (INDEPENDENT_AMBULATORY_CARE_PROVIDER_SITE_OTHER): Payer: 59

## 2014-07-23 ENCOUNTER — Other Ambulatory Visit: Payer: Self-pay

## 2014-07-23 DIAGNOSIS — R002 Palpitations: Secondary | ICD-10-CM

## 2014-07-23 DIAGNOSIS — I255 Ischemic cardiomyopathy: Secondary | ICD-10-CM

## 2014-08-02 ENCOUNTER — Encounter: Payer: Self-pay | Admitting: Orthopedic Surgery

## 2014-08-05 DIAGNOSIS — J309 Allergic rhinitis, unspecified: Secondary | ICD-10-CM | POA: Diagnosis not present

## 2014-08-05 DIAGNOSIS — I2510 Atherosclerotic heart disease of native coronary artery without angina pectoris: Secondary | ICD-10-CM | POA: Diagnosis not present

## 2014-08-05 DIAGNOSIS — E1165 Type 2 diabetes mellitus with hyperglycemia: Secondary | ICD-10-CM | POA: Diagnosis not present

## 2014-08-05 DIAGNOSIS — E785 Hyperlipidemia, unspecified: Secondary | ICD-10-CM | POA: Diagnosis not present

## 2014-08-05 DIAGNOSIS — J45909 Unspecified asthma, uncomplicated: Secondary | ICD-10-CM | POA: Diagnosis not present

## 2014-08-05 DIAGNOSIS — I2119 ST elevation (STEMI) myocardial infarction involving other coronary artery of inferior wall: Secondary | ICD-10-CM | POA: Diagnosis not present

## 2014-08-05 DIAGNOSIS — M109 Gout, unspecified: Secondary | ICD-10-CM | POA: Diagnosis not present

## 2014-08-05 DIAGNOSIS — M75121 Complete rotator cuff tear or rupture of right shoulder, not specified as traumatic: Secondary | ICD-10-CM | POA: Diagnosis not present

## 2014-08-05 DIAGNOSIS — I10 Essential (primary) hypertension: Secondary | ICD-10-CM | POA: Diagnosis not present

## 2014-08-06 ENCOUNTER — Telehealth: Payer: Self-pay | Admitting: *Deleted

## 2014-08-06 ENCOUNTER — Other Ambulatory Visit: Payer: Self-pay | Admitting: Pulmonary Disease

## 2014-08-06 ENCOUNTER — Other Ambulatory Visit: Payer: Self-pay | Admitting: Family Medicine

## 2014-08-06 NOTE — Telephone Encounter (Signed)
Patient called wanting echo results.

## 2014-08-07 NOTE — Telephone Encounter (Signed)
Informed patient of echo results  Patient requested Eliquis samples  Samples at front desk for patient

## 2014-08-07 NOTE — Telephone Encounter (Signed)
LVM 11/06

## 2014-08-18 ENCOUNTER — Telehealth: Payer: Self-pay

## 2014-08-18 NOTE — Telephone Encounter (Signed)
Pt would like holter monitor results.  

## 2014-08-18 NOTE — Telephone Encounter (Signed)
LVM @ 1117

## 2014-08-18 NOTE — Telephone Encounter (Signed)
Results in Dr. Aquilla Hacker folder to be signed today  Patient aware

## 2014-08-20 NOTE — Telephone Encounter (Signed)
Informed patient that per Dr. Caryl Comes and Dr. Fletcher Anon event monitor showed: No afib/flutter  Frequent PVC's  Same meds Keep follow up with Dr. Fletcher Anon

## 2014-08-21 ENCOUNTER — Other Ambulatory Visit: Payer: Self-pay

## 2014-08-21 DIAGNOSIS — I2119 ST elevation (STEMI) myocardial infarction involving other coronary artery of inferior wall: Secondary | ICD-10-CM

## 2014-09-01 ENCOUNTER — Encounter: Payer: Self-pay | Admitting: Orthopedic Surgery

## 2014-09-10 ENCOUNTER — Encounter (HOSPITAL_COMMUNITY): Payer: Self-pay | Admitting: Internal Medicine

## 2014-09-15 DIAGNOSIS — C44629 Squamous cell carcinoma of skin of left upper limb, including shoulder: Secondary | ICD-10-CM | POA: Diagnosis not present

## 2014-09-15 DIAGNOSIS — L578 Other skin changes due to chronic exposure to nonionizing radiation: Secondary | ICD-10-CM | POA: Diagnosis not present

## 2014-09-17 DIAGNOSIS — H40002 Preglaucoma, unspecified, left eye: Secondary | ICD-10-CM | POA: Diagnosis not present

## 2014-09-18 ENCOUNTER — Ambulatory Visit (INDEPENDENT_AMBULATORY_CARE_PROVIDER_SITE_OTHER): Payer: 59 | Admitting: Cardiovascular Disease

## 2014-09-18 ENCOUNTER — Encounter: Payer: Self-pay | Admitting: Cardiovascular Disease

## 2014-09-18 VITALS — BP 118/80 | HR 54 | Ht 70.5 in | Wt 241.0 lb

## 2014-09-18 DIAGNOSIS — E785 Hyperlipidemia, unspecified: Secondary | ICD-10-CM

## 2014-09-18 DIAGNOSIS — I2510 Atherosclerotic heart disease of native coronary artery without angina pectoris: Secondary | ICD-10-CM

## 2014-09-18 DIAGNOSIS — I10 Essential (primary) hypertension: Secondary | ICD-10-CM

## 2014-09-18 DIAGNOSIS — I4892 Unspecified atrial flutter: Secondary | ICD-10-CM

## 2014-09-18 NOTE — Progress Notes (Signed)
Primary care physician: Dr. Diona Browner  HPI  This is a pleasant 68 year old male who is here today for follow-up visit regarding coronary artery disease and atrial flutter.  He has known history of coronary artery disease with previous myocardial infarction  in 2009 with PCI done by Dr. Collene Mares and no cardiac events since then. He also reports unspecified tachyarrhythmia. He has other chronic medical conditions that include type 2 diabetes, hypertension and hyperlipidemia.  He he was seen 6 months ago for preoperative cardiovascular evaluation. He was found to be in atrial flutter with controlled ventricular rate. He underwent a nuclear stress test which showed evidence of prior inferior and inferolateral infarct without significant ischemia. Ejection fraction was 47%. He underwent surgery without complications. He was referred to Dr. Caryl Comes And underwent atrial flutter ablation in August 2015. He had recurrent palpitations. 48 hour Holter monitor showed no evidence of atrial fibrillation or flutter. He was noted to have frequent PVCs. Echocardiogram in October 2015 showed an ejection fraction of 55-60%, mildly dilated left atrium and no evidence of pulmonary hypertension. He has been doing reasonably well although he continues to complain of frequent skipping in his heart and fluctuation in heart rate. No chest pain or shortness of breath. He does have sleep apnea and uses a CPAP machine.   He quit smoking 20 years ago. He has no previous history of stroke or congestive heart failure.    No Known Allergies   Current Outpatient Prescriptions on File Prior to Visit  Medication Sig Dispense Refill  . BYSTOLIC 10 MG tablet Take 1 tablet (10 mg total) by mouth daily. 90 tablet 1  . Cholecalciferol (VITAMIN D3) 2000 UNITS TABS Take 2,000 Units by mouth daily.     . fenofibrate 160 MG tablet TAKE 1 TABLET (160 MG TOTAL) BY MOUTH DAILY. 90 tablet 1  . fish oil-omega-3 fatty acids 1000 MG capsule Take 2 g  by mouth 2 (two) times daily.     Marland Kitchen glucose blood (BAYER CONTOUR NEXT TEST) test strip Use as instructed 100 each 12  . Lancets (FREESTYLE) lancets 1 each by Other route See admin instructions. Check blood sugar twice daily.    Marland Kitchen losartan (COZAAR) 100 MG tablet TAKE ONE TABLET BY MOUTH DAILY 90 tablet 1  . modafinil (PROVIGIL) 200 MG tablet TAKE 1 TABLET BY MOUTH ONCE DAILY 90 tablet 0  . NON FORMULARY 1 each by Other route See admin instructions. Use CPAP nightly.    . rosuvastatin (CRESTOR) 10 MG tablet Take 10 mg by mouth daily.    . tamsulosin (FLOMAX) 0.4 MG CAPS capsule TAKE 1 CAPSULE (0.4 MG TOTAL) BY MOUTH DAILY. 90 capsule 1  . timolol (BETIMOL) 0.5 % ophthalmic solution Place 1 drop into both eyes at bedtime.    Marland Kitchen apixaban (ELIQUIS) 5 MG TABS tablet Take 5 mg by mouth 2 (two) times daily.     No current facility-administered medications on file prior to visit.     Past Medical History  Diagnosis Date  . Hyperlipidemia   . Hypertension   . Allergy   . Glaucoma   . Retinal vascular occlusion, unspecified     Right  . Extrinsic asthma, unspecified   . Coronary atherosclerosis of unspecified type of vessel, native or graft   . Gout   . Diabetes mellitus without complication   . Arthritis   . Myocardial infarction 1/09    PCI done by Dr. Collene Mares in [place]  . Atrial flutter  Past Surgical History  Procedure Laterality Date  . Tonsillectomy    . Appendectomy  1954  . Neck surgery  1990's    x2, Fusion  . Colonoscopy    . Coronary angioplasty  2009    stent [place]  . Shoulder arthroscopy with subacromial decompression Left 04/02/2014    Procedure: SHOULDER ARTHROSCOPY WITH SUBACROMIAL DECOMPRESSION WITH ROTATOR CUFF REPAIR;  Surgeon: Nita Sells, MD;  Location: [facility];  Service: Orthopedics;  Laterality: Left;  Left shoulder rotator cuff repair, subacromial decompression  . Ablation  05-07-2014    EPS and ablation of atrial  flutter by Dr Caryl Comes  . Atrial flutter ablation N/A 05/07/2014    Procedure: ATRIAL FLUTTER ABLATION;  Surgeon: Deboraha Sprang, MD;  Location: [facility] CATH LAB;  Service: Cardiovascular;  Laterality: N/A;  . Skin surgery       Family History  Problem Relation Age of Onset  . Diabetes Sister   . Coronary artery disease Brother   . AAA (abdominal aortic aneurysm) Father   . Heart disease Mother   . Diabetes Mother   . Diabetes Brother      History   Social History  . Marital Status: Married    Spouse Name: N/A    Number of Children: N/A  . Years of Education: N/A   Occupational History  . retired    Social History Main Topics  . Smoking status: Former Smoker -- 1.00 packs/day for 20 years    Types: Cigarettes    Quit date: 10/02/1988  . Smokeless tobacco: Never Used     Comment: quit in the 1990's  . Alcohol Use: 1.8 oz/week    3 Cans of beer per week     Comment: 3-4 drinks per week.   . Drug Use: No  . Sexual Activity: Not on file   Other Topics Concern  . Not on file   Social History Narrative     ROS A 10 point review of system was performed. It is negative other than that mentioned in the history of present illness.   PHYSICAL EXAM   BP 118/80 mmHg  Pulse 54  Ht 5' 10.5" (1.791 m)  Wt 241 lb (109.317 kg)  BMI 34.08 kg/m2 Constitutional: He is oriented to person, place, and time. He appears well-developed and well-nourished. No distress.  HENT: No nasal discharge.  Head: Normocephalic and atraumatic.  Eyes: Pupils are equal and round.  No discharge. Neck: Normal range of motion. Neck supple. No JVD present. No thyromegaly present.  Cardiovascular: Normal rate, regular rhythm, normal heart sounds. Exam reveals no gallop and no friction rub. No murmur heard.  Pulmonary/Chest: Effort normal and breath sounds normal. No stridor. No respiratory distress. He has no wheezes. He has no rales. He exhibits no tenderness.  Abdominal: Soft. Bowel sounds are normal. He  exhibits no distension. There is no tenderness. There is no rebound and no guarding.  Musculoskeletal: Normal range of motion. He exhibits no edema and no tenderness.  Neurological: He is alert and oriented to person, place, and time. Coordination normal.  Skin: Skin is warm and dry. No rash noted. He is not diaphoretic. No erythema. No pallor.  Psychiatric: He has a normal mood and affect. His behavior is normal. Judgment and thought content normal.       EKG: Sinus  Bradycardia  -First degree A-V block  PRi = 226 -Old inferior-apical infarct  -Poor R-wave progression -nonspecific -consider old anterior infarct  -  Left axis secondary to infarct -consider anterior fascicular block.   ABNORMAL    ASSESSMENT AND PLAN

## 2014-09-18 NOTE — Patient Instructions (Signed)
Continue same medications.  Follow up in 3 months.  

## 2014-09-18 NOTE — Assessment & Plan Note (Signed)
Blood pressure is controlled on current medications. 

## 2014-09-18 NOTE — Assessment & Plan Note (Signed)
Lab Results  Component Value Date   CHOL 131 07/07/2014   HDL 28.50* 07/07/2014   LDLCALC 74 07/07/2014   LDLDIRECT 66.8 07/17/2012   TRIG 145.0 07/07/2014   CHOLHDL 5 07/07/2014   Continue treatment with fenofibrate and rosuvastatin. LDL was at target.

## 2014-09-18 NOTE — Assessment & Plan Note (Signed)
He is doing well with no symptoms suggestive of angina. Continue medical therapy. 

## 2014-09-18 NOTE — Assessment & Plan Note (Signed)
He had successful ablation. He continues to be in sinus rhythm. He continues to complain of frequent palpitations and fluctuation in heart rate. I believe this is likely due to PVCs as was noted on Holter monitor. I discussed with him 2 options the first is to give him a 30 day monitor to ensure no recurrent flutter or fibrillation before stopping Eliquis. The other option would be to observe him clinically and have him follow-up in 3 months for reevaluation. If he continues to maintain in sinus rhythm at that time, Eliquis can be discontinued. He prefers the second option.

## 2014-10-05 DIAGNOSIS — Z9889 Other specified postprocedural states: Secondary | ICD-10-CM | POA: Diagnosis not present

## 2014-10-07 ENCOUNTER — Encounter: Payer: Self-pay | Admitting: Orthopedic Surgery

## 2014-10-07 DIAGNOSIS — M25612 Stiffness of left shoulder, not elsewhere classified: Secondary | ICD-10-CM | POA: Diagnosis not present

## 2014-10-07 DIAGNOSIS — M6281 Muscle weakness (generalized): Secondary | ICD-10-CM | POA: Diagnosis not present

## 2014-10-15 DIAGNOSIS — M25612 Stiffness of left shoulder, not elsewhere classified: Secondary | ICD-10-CM | POA: Diagnosis not present

## 2014-10-15 DIAGNOSIS — M6281 Muscle weakness (generalized): Secondary | ICD-10-CM | POA: Diagnosis not present

## 2014-10-16 ENCOUNTER — Telehealth: Payer: Self-pay

## 2014-10-16 NOTE — Telephone Encounter (Signed)
Samples given as requested

## 2014-10-16 NOTE — Telephone Encounter (Signed)
Pt would like Eliquis samples.  

## 2014-10-22 DIAGNOSIS — M25612 Stiffness of left shoulder, not elsewhere classified: Secondary | ICD-10-CM | POA: Diagnosis not present

## 2014-10-22 DIAGNOSIS — M6281 Muscle weakness (generalized): Secondary | ICD-10-CM | POA: Diagnosis not present

## 2014-10-26 DIAGNOSIS — M25612 Stiffness of left shoulder, not elsewhere classified: Secondary | ICD-10-CM | POA: Diagnosis not present

## 2014-10-26 DIAGNOSIS — M6281 Muscle weakness (generalized): Secondary | ICD-10-CM | POA: Diagnosis not present

## 2014-10-27 ENCOUNTER — Encounter: Payer: Self-pay | Admitting: Pulmonary Disease

## 2014-10-27 ENCOUNTER — Ambulatory Visit (INDEPENDENT_AMBULATORY_CARE_PROVIDER_SITE_OTHER): Payer: 59 | Admitting: Pulmonary Disease

## 2014-10-27 ENCOUNTER — Other Ambulatory Visit: Payer: Self-pay | Admitting: Pulmonary Disease

## 2014-10-27 VITALS — BP 138/76 | HR 63 | Temp 97.0°F | Ht 70.5 in | Wt 241.8 lb

## 2014-10-27 DIAGNOSIS — G4733 Obstructive sleep apnea (adult) (pediatric): Secondary | ICD-10-CM

## 2014-10-27 DIAGNOSIS — G4710 Hypersomnia, unspecified: Secondary | ICD-10-CM | POA: Diagnosis not present

## 2014-10-27 MED ORDER — MODAFINIL 200 MG PO TABS: 200.0000 mg | ORAL_TABLET | Freq: Every day | ORAL | Status: DC

## 2014-10-27 NOTE — Progress Notes (Signed)
   Subjective:    Patient ID: Tommy Sullivan, male    DOB: 1946/06/25, 69 y.o.   MRN: [id number]  HPI The patient comes in today for follow-up of his obstructive sleep apnea. He also has daytime hypersomnia that may be related to his sleep disordered breathing, but may also be an issue with his sleep hygiene and chronic pain. He feels that he sleeps fairly well with the device, and is having no issues with his mask fit. He prefers to be on the automatic setting, but his machine does not have this available. He thinks it is up for renewal this year.   Review of Systems  Constitutional: Negative for fever and unexpected weight change.  HENT: Negative for congestion, dental problem, ear pain, nosebleeds, postnasal drip, rhinorrhea, sinus pressure, sneezing, sore throat and trouble swallowing.   Eyes: Negative for redness and itching.  Respiratory: Negative for cough, chest tightness, shortness of breath and wheezing.   Cardiovascular: Negative for palpitations and leg swelling.  Gastrointestinal: Negative for nausea and vomiting.  Genitourinary: Negative for dysuria.  Musculoskeletal: Negative for joint swelling.  Skin: Negative for rash.  Neurological: Negative for headaches.  Hematological: Does not bruise/bleed easily.  Psychiatric/Behavioral: Negative for dysphoric mood. The patient is not nervous/anxious.        Objective:   Physical Exam Overweight male in no acute distress Nose without purulence or discharge noted No skin breakdown or pressure necrosis from the C Pap mask Neck without lymphadenopathy or thyromegaly Lower extremities without edema, no cyanosis Alert and oriented, moves all 4 extremities.       Assessment & Plan:

## 2014-10-27 NOTE — Assessment & Plan Note (Signed)
The patient is continuing on C Pap, and overall feels that he is doing well with the device. He would prefer to be on the automatic setting, and his current machine does not have this available. He thinks he is due for a new device, and I can send an order to see if he is eligible. In the meantime, I have asked him to stay on C Pap, and to keep up with his mask changes and supplies. I've also encouraged to work aggressively on weight loss. Finally, I have asked him to continue with good sleep hygiene in order to improve his daytime sleepiness.

## 2014-10-27 NOTE — Patient Instructions (Signed)
Stay on cpap, and keep up with supplies. Will send an order to [facility] to see if you are eligible for a new device Will refill provigil Work on weight loss followup with me again in one year.

## 2014-10-29 DIAGNOSIS — M25612 Stiffness of left shoulder, not elsewhere classified: Secondary | ICD-10-CM | POA: Diagnosis not present

## 2014-10-29 DIAGNOSIS — M6281 Muscle weakness (generalized): Secondary | ICD-10-CM | POA: Diagnosis not present

## 2014-11-02 ENCOUNTER — Encounter: Payer: Self-pay | Admitting: Orthopedic Surgery

## 2014-11-02 ENCOUNTER — Other Ambulatory Visit: Payer: Self-pay | Admitting: Family Medicine

## 2014-11-02 DIAGNOSIS — M25612 Stiffness of left shoulder, not elsewhere classified: Secondary | ICD-10-CM | POA: Diagnosis not present

## 2014-11-02 DIAGNOSIS — M6281 Muscle weakness (generalized): Secondary | ICD-10-CM | POA: Diagnosis not present

## 2014-11-03 ENCOUNTER — Telehealth: Payer: Self-pay

## 2014-11-03 NOTE — Telephone Encounter (Signed)
Pt states he needs Eliquis samples for 4 more weeks.

## 2014-11-04 NOTE — Telephone Encounter (Signed)
The plan is to take him off Eliquis in March if no evidence of recurrent A-fib.

## 2014-11-05 NOTE — Telephone Encounter (Signed)
Samples at front desk for pick up  Patient aware

## 2014-11-16 DIAGNOSIS — Z9889 Other specified postprocedural states: Secondary | ICD-10-CM | POA: Diagnosis not present

## 2014-11-16 DIAGNOSIS — M75121 Complete rotator cuff tear or rupture of right shoulder, not specified as traumatic: Secondary | ICD-10-CM | POA: Diagnosis not present

## 2014-11-20 ENCOUNTER — Telehealth: Payer: Self-pay

## 2014-11-20 NOTE — Telephone Encounter (Signed)
Pt would like Eliquis samples.  

## 2014-11-23 ENCOUNTER — Encounter: Payer: Self-pay | Admitting: *Deleted

## 2014-11-23 NOTE — Telephone Encounter (Signed)
Placed samples of Eliquis 5 mg @ front desk.

## 2014-11-24 DIAGNOSIS — M25512 Pain in left shoulder: Secondary | ICD-10-CM | POA: Diagnosis not present

## 2014-11-27 DIAGNOSIS — M75121 Complete rotator cuff tear or rupture of right shoulder, not specified as traumatic: Secondary | ICD-10-CM | POA: Diagnosis not present

## 2014-11-27 NOTE — Telephone Encounter (Signed)
This encounter was created in error - please disregard.

## 2014-12-01 ENCOUNTER — Encounter
Admit: 2014-12-01 | Disposition: A | Discharge status: Home / Self Care | Payer: Self-pay | Attending: Orthopedic Surgery | Admitting: Orthopedic Surgery

## 2014-12-01 ENCOUNTER — Other Ambulatory Visit: Payer: Self-pay | Admitting: Family Medicine

## 2014-12-01 DIAGNOSIS — M6281 Muscle weakness (generalized): Secondary | ICD-10-CM | POA: Diagnosis not present

## 2014-12-01 DIAGNOSIS — M25612 Stiffness of left shoulder, not elsewhere classified: Secondary | ICD-10-CM | POA: Diagnosis not present

## 2014-12-15 ENCOUNTER — Telehealth: Payer: Self-pay | Admitting: Family Medicine

## 2014-12-15 ENCOUNTER — Other Ambulatory Visit: Payer: 59

## 2014-12-15 DIAGNOSIS — E119 Type 2 diabetes mellitus without complications: Secondary | ICD-10-CM

## 2014-12-15 DIAGNOSIS — M1A9XX0 Chronic gout, unspecified, without tophus (tophi): Secondary | ICD-10-CM

## 2014-12-15 NOTE — Telephone Encounter (Signed)
-----   Message from Ellamae Sia sent at 12/09/2014  4:22 PM EST ----- Regarding: Lab orders for Tuesday, 3.15.16 Patient is scheduled for CPX labs, please order future labs, Thanks , Karna Christmas

## 2014-12-18 ENCOUNTER — Ambulatory Visit (INDEPENDENT_AMBULATORY_CARE_PROVIDER_SITE_OTHER): Payer: 59 | Admitting: Cardiovascular Disease

## 2014-12-18 ENCOUNTER — Encounter: Payer: Self-pay | Admitting: Cardiovascular Disease

## 2014-12-18 ENCOUNTER — Other Ambulatory Visit (INDEPENDENT_AMBULATORY_CARE_PROVIDER_SITE_OTHER): Payer: 59

## 2014-12-18 VITALS — BP 130/80 | HR 72 | Ht 70.5 in | Wt 241.5 lb

## 2014-12-18 DIAGNOSIS — I2510 Atherosclerotic heart disease of native coronary artery without angina pectoris: Secondary | ICD-10-CM | POA: Diagnosis not present

## 2014-12-18 DIAGNOSIS — I10 Essential (primary) hypertension: Secondary | ICD-10-CM

## 2014-12-18 DIAGNOSIS — I483 Typical atrial flutter: Secondary | ICD-10-CM | POA: Diagnosis not present

## 2014-12-18 DIAGNOSIS — E785 Hyperlipidemia, unspecified: Secondary | ICD-10-CM

## 2014-12-18 DIAGNOSIS — I4902 Ventricular flutter: Secondary | ICD-10-CM | POA: Diagnosis not present

## 2014-12-18 DIAGNOSIS — E119 Type 2 diabetes mellitus without complications: Secondary | ICD-10-CM

## 2014-12-18 LAB — HEMOGLOBIN A1C: Hgb A1c MFr Bld: 8 % — ABNORMAL HIGH (ref 4.6–6.5)

## 2014-12-18 LAB — COMPREHENSIVE METABOLIC PANEL
ALK PHOS: 26 U/L — AB (ref 39–117)
ALT: 19 U/L (ref 0–53)
AST: 19 U/L (ref 0–37)
Albumin: 3.8 g/dL (ref 3.5–5.2)
BILIRUBIN TOTAL: 0.3 mg/dL (ref 0.2–1.2)
BUN: 26 mg/dL — ABNORMAL HIGH (ref 6–23)
CO2: 29 meq/L (ref 19–32)
Calcium: 9.6 mg/dL (ref 8.4–10.5)
Chloride: 102 mEq/L (ref 96–112)
Creatinine, Ser: 1.05 mg/dL (ref 0.40–1.50)
GFR: 74.45 mL/min (ref >60.00)
Glucose, Bld: 177 mg/dL — ABNORMAL HIGH (ref 70–99)
Potassium: 3.9 mEq/L (ref 3.5–5.1)
SODIUM: 136 meq/L (ref 135–145)
Total Protein: 6.3 g/dL (ref 6.0–8.3)

## 2014-12-18 LAB — LIPID PANEL
CHOL/HDL RATIO: 5
Cholesterol: 147 mg/dL (ref 0–200)
HDL: 28 mg/dL — AB (ref >39.00)
NONHDL: 119
Triglycerides: 364 mg/dL — ABNORMAL HIGH (ref 0.0–149.0)
VLDL: 72.8 mg/dL — AB (ref 0.0–40.0)

## 2014-12-18 LAB — LDL CHOLESTEROL, DIRECT: Direct LDL: 79 mg/dL

## 2014-12-18 MED ORDER — ASPIRIN EC 81 MG PO TBEC: 81.0000 mg | DELAYED_RELEASE_TABLET | Freq: Every day | ORAL | Status: DC

## 2014-12-18 NOTE — Patient Instructions (Signed)
Stop taking Eliquis.  Start Aspirin 81 mg once daily.   Your physician wants you to follow-up in: 6 months.  You will receive a reminder letter in the mail two months in advance. If you don't receive a letter, please call our office to schedule the follow-up appointment.

## 2014-12-18 NOTE — Assessment & Plan Note (Signed)
Lab Results  Component Value Date   CHOL 147 12/18/2014   HDL 28.00* 12/18/2014   LDLCALC 74 07/07/2014   LDLDIRECT 79.0 12/18/2014   TRIG 364.0* 12/18/2014   CHOLHDL 5 12/18/2014   Continue treatment with rosuvastatin and fenofibrate.

## 2014-12-18 NOTE — Progress Notes (Signed)
Primary care physician: Dr. Diona Browner  HPI  This is a pleasant 69 year old male who is here today for follow-up visit regarding coronary artery disease and atrial flutter.  He has known history of coronary artery disease with previous myocardial infarction  in 2009 with PCI done by Dr. Collene Mares and no cardiac events since then. He also reports unspecified tachyarrhythmia. He has other chronic medical conditions that include type 2 diabetes, hypertension and hyperlipidemia.  He he was seen in 03/2014 for preoperative cardiovascular evaluation. He was found to be in atrial flutter with controlled ventricular rate. He underwent a nuclear stress test which showed evidence of prior inferior and inferolateral infarct without significant ischemia. Ejection fraction was 47%. He underwent surgery without complications. He underwent atrial flutter ablation in August 2015 by Dr. Caryl Comes. He had recurrent palpitations. 48 hour Holter monitor showed no evidence of atrial fibrillation or flutter. He was noted to have frequent PVCs. Echocardiogram in October 2015 showed an ejection fraction of 55-60%, mildly dilated left atrium and no evidence of pulmonary hypertension. He has been doing well and denies any chest pain or shortness of breath. He has stable palpitations thought to be due to PVCs.    No Known Allergies   Current Outpatient Prescriptions on File Prior to Visit  Medication Sig Dispense Refill  . BYSTOLIC 10 MG tablet TAKE 1 TABLET BY MOUTH DAILY. 90 tablet 1  . Cholecalciferol (VITAMIN D) 2000 UNITS CAPS Take by mouth daily.    . CRESTOR 10 MG tablet TAKE 1 TABLET BY MOUTH DAILY. 90 tablet 1  . fenofibrate 160 MG tablet TAKE 1 TABLET BY MOUTH DAILY. 90 tablet 1  . losartan (COZAAR) 100 MG tablet Once daily  1  . modafinil (PROVIGIL) 200 MG tablet Take 1 tablet (200 mg total) by mouth daily. Once daily 90 tablet 1  . Omega-3 Fatty Acids (FISH OIL) 1000 MG CAPS Take by mouth 2 (two) times daily.    .  tamsulosin (FLOMAX) 0.4 MG CAPS capsule TAKE 1 CAPSULE BY MOUTH DAILY. 90 capsule 1  . timolol (BETIMOL) 0.5 % ophthalmic solution Place 1 drop into the left eye daily.    . traMADol (ULTRAM) 50 MG tablet Take 50 mg by mouth every 6 (six) hours as needed.     No current facility-administered medications on file prior to visit.     Past Medical History  Diagnosis Date  . Hyperlipidemia   . Hypertension   . Allergy   . Glaucoma   . Retinal vascular occlusion, unspecified     Right  . Extrinsic asthma, unspecified   . Coronary atherosclerosis of unspecified type of vessel, native or graft   . Gout   . Diabetes mellitus without complication   . Arthritis   . Myocardial infarction 1/09    PCI done by Dr. Collene Mares in [place]  . Atrial flutter      Past Surgical History  Procedure Laterality Date  . Tonsillectomy    . Appendectomy  1954  . Neck surgery  1990's    x2, Fusion  . Colonoscopy    . Coronary angioplasty  2009    stent [place]  . Shoulder arthroscopy with subacromial decompression Left 04/02/2014    Procedure: SHOULDER ARTHROSCOPY WITH SUBACROMIAL DECOMPRESSION WITH ROTATOR CUFF REPAIR;  Surgeon: Nita Sells, MD;  Location: [facility];  Service: Orthopedics;  Laterality: Left;  Left shoulder rotator cuff repair, subacromial decompression  . Ablation  05-07-2014  EPS and ablation of atrial flutter by Dr Caryl Comes  . Atrial flutter ablation N/A 05/07/2014    Procedure: ATRIAL FLUTTER ABLATION;  Surgeon: Deboraha Sprang, MD;  Location: [facility] CATH LAB;  Service: Cardiovascular;  Laterality: N/A;  . Skin surgery       Family History  Problem Relation Age of Onset  . Diabetes Sister   . Coronary artery disease Brother   . AAA (abdominal aortic aneurysm) Father   . Heart disease Mother   . Diabetes Mother   . Diabetes Brother      History   Social History  . Marital Status: Married    Spouse Name: N/A  . Number of Children: N/A  . Years  of Education: N/A   Occupational History  . retired    Social History Main Topics  . Smoking status: Former Smoker -- 1.00 packs/day for 20 years    Types: Cigarettes    Quit date: 10/02/1988  . Smokeless tobacco: Never Used     Comment: quit in the 1990's  . Alcohol Use: 1.8 oz/week    3 Cans of beer per week     Comment: 3-4 drinks per week.   . Drug Use: No  . Sexual Activity: Not on file   Other Topics Concern  . Not on file   Social History Narrative     ROS A 10 point review of system was performed. It is negative other than that mentioned in the history of present illness.   PHYSICAL EXAM   BP 130/80 mmHg  Pulse 72  Ht 5' 10.5" (1.791 m)  Wt 241 lb 8 oz (109.544 kg)  BMI 34.15 kg/m2 Constitutional: He is oriented to person, place, and time. He appears well-developed and well-nourished. No distress.  HENT: No nasal discharge.  Head: Normocephalic and atraumatic.  Eyes: Pupils are equal and round.  No discharge. Neck: Normal range of motion. Neck supple. No JVD present. No thyromegaly present.  Cardiovascular: Normal rate, regular rhythm, normal heart sounds. Exam reveals no gallop and no friction rub. No murmur heard.  Pulmonary/Chest: Effort normal and breath sounds normal. No stridor. No respiratory distress. He has no wheezes. He has no rales. He exhibits no tenderness.  Abdominal: Soft. Bowel sounds are normal. He exhibits no distension. There is no tenderness. There is no rebound and no guarding.  Musculoskeletal: Normal range of motion. He exhibits no edema and no tenderness.  Neurological: He is alert and oriented to person, place, and time. Coordination normal.  Skin: Skin is warm and dry. No rash noted. He is not diaphoretic. No erythema. No pallor.  Psychiatric: He has a normal mood and affect. His behavior is normal. Judgment and thought content normal.       EKG: Sinus  Bradycardia  -First degree A-V block  PRi = 226 -Old inferior-apical  infarct  -Poor R-wave progression -nonspecific -consider old anterior infarct  -Left axis secondary to infarct -consider anterior fascicular block.   ABNORMAL    ASSESSMENT AND PLAN

## 2014-12-18 NOTE — Assessment & Plan Note (Signed)
He had successful ablation and currently is maintaining sinus rhythm. Thus, I discontinued anticoagulation with Eliquis. Resume aspirin 81 mg once daily due to known history of coronary artery disease with previous stenting.

## 2014-12-18 NOTE — Assessment & Plan Note (Signed)
Blood pressure is well controlled on current medications. 

## 2014-12-18 NOTE — Assessment & Plan Note (Signed)
He is doing well with no symptoms suggestive of angina. Continue medical therapy. Most recent nuclear stress test showed no evidence of ischemia.

## 2014-12-22 ENCOUNTER — Ambulatory Visit (INDEPENDENT_AMBULATORY_CARE_PROVIDER_SITE_OTHER): Payer: 59 | Admitting: Family Medicine

## 2014-12-22 ENCOUNTER — Encounter: Payer: Self-pay | Admitting: Family Medicine

## 2014-12-22 VITALS — BP 120/66 | HR 62 | Temp 98.9°F | Ht 70.0 in | Wt 241.2 lb

## 2014-12-22 DIAGNOSIS — Z125 Encounter for screening for malignant neoplasm of prostate: Secondary | ICD-10-CM

## 2014-12-22 DIAGNOSIS — Z0000 Encounter for general adult medical examination without abnormal findings: Secondary | ICD-10-CM | POA: Diagnosis not present

## 2014-12-22 DIAGNOSIS — E119 Type 2 diabetes mellitus without complications: Secondary | ICD-10-CM | POA: Diagnosis not present

## 2014-12-22 DIAGNOSIS — I2510 Atherosclerotic heart disease of native coronary artery without angina pectoris: Secondary | ICD-10-CM

## 2014-12-22 DIAGNOSIS — E785 Hyperlipidemia, unspecified: Secondary | ICD-10-CM

## 2014-12-22 DIAGNOSIS — Z7189 Other specified counseling: Secondary | ICD-10-CM | POA: Insufficient documentation

## 2014-12-22 DIAGNOSIS — I10 Essential (primary) hypertension: Secondary | ICD-10-CM

## 2014-12-22 LAB — HM DIABETES FOOT EXAM

## 2014-12-22 NOTE — Progress Notes (Signed)
Pre visit review using our clinic review tool, if applicable. No additional management support is needed unless otherwise documented below in the visit note. 

## 2014-12-22 NOTE — Assessment & Plan Note (Signed)
Well controlled. Continue current medication.  

## 2014-12-22 NOTE — Progress Notes (Signed)
HPI  I have personally reviewed the Medicare Annual Wellness questionnaire and have noted  1. The patient's medical and social history  2. Their use of alcohol, tobacco or illicit drugs  3. Their current medications and supplements  4. The patient's functional ability including ADL's, fall risks, home safety risks and hearing or visual  impairment.  5. Diet and physical activities  6. Evidence for depression or mood disorders  The patients weight, height, BMI and visual acuity have been recorded in the chart  I have made referrals, counseling and provided education to the patient based review of the above and I have provided the pt with a written personalized care plan for preventive services.    Using tramadol for pain in left shoulder. Dry needling has helped. He is hopoing to decrease tramadol.  Elevated Cholesterol: LDL almost at  < 70 goal  Due to CAD, triglycerides better on crestor 10 mg daily, fenofibrate, fish oil  Lab Results  Component Value Date   CHOL 147 12/18/2014   HDL 28.00* 12/18/2014   LDLCALC 74 07/07/2014   LDLDIRECT 79.0 12/18/2014   TRIG 364.0* 12/18/2014   CHOLHDL 5 12/18/2014  Diet: Good Exercise: walking daily, getting back to exercise since shoulder pain improved.   Diabetes:  On no med for DM. Lab Results  Component Value Date   HGBA1C 8.0* 12/18/2014  Using medications without difficulties: Hypoglycemic episodes: none Hyperglycemic episodes:yes Feet problems: Blood Sugars averaging: postprandial 200, FBS: 140-170 eye exam within last year: due  Has gained weight in  last 3 months with inactivity. Wt Readings from Last 3 Encounters:  12/22/14 241 lb 4 oz (109.43 kg)  12/18/14 241 lb 8 oz (109.544 kg)  10/27/14 241 lb 12.8 oz (109.68 kg)   Hypertension:   BP Readings from Last 3 Encounters:  12/22/14 120/66  12/18/14 130/80  10/27/14 138/76  Using medication without problems or lightheadedness: None Chest pain with  exertion:None Edema:None Short of breath: None Average home BPs: not checking at home Other issues:   Sleep apnea on CPAP, adjusting. Having too much air in chest, belching in AM. Chest pain .Marland Kitchen Over extended.  History  Substance Use Topics  . Smoking status: Former Smoker -- 1.00 packs/day for 20 years    Types: Cigarettes    Quit date: 10/02/1988  . Smokeless tobacco: Never Used     Comment: quit in the 1990's  . Alcohol Use: 1.8 oz/week    3 Cans of beer per week     Comment: 3-4 drinks per week.    family history includes AAA (abdominal aortic aneurysm) in his father; Coronary artery disease in his brother; Diabetes in his brother, mother, and sister; Heart disease in his mother.  Review of Systems  Objective:   Physical Exam  Physical Exam  Constitutional: He is oriented to person, place, and time. He appears well-developed and well-nourished.  HENT:  Right Ear: External ear normal.  Mouth/Throat: Oropharynx is clear and moist. No oropha.lastbp3 ryngeal exudate.  Eyes: Conjunctivae are normal. Pupils are equal, round, and reactive to light.  Neck: Normal range of motion. Neck supple. No thyromegaly present.  Cardiovascular: Normal rate, regular rhythm and normal heart sounds. Exam reveals no gallop and no friction rub.  No murmur heard. Respiratory: Effort normal and breath sounds normal. No respiratory distress. He has no wheezes. He has no rales. He exhibits no tenderness.  GI: Soft. Bowel sounds are normal. There is no tenderness.  Genitourinary: Rectum normal, prostate normal and  penis normal. No penile tenderness.  Neurological: He is alert and oriented to person, place, and time.  Skin: Skin is warm and dry.  Psychiatric: He has a normal mood and affect. His behavior is normal. Judgment and thought content normal.   Diabetic foot exam: Normal inspection No skin breakdown No calluses  Normal DP pulses Normal sensation to light touch and  monofilament Nails normal  Assessment & Plan:   The patient's preventative maintenance and recommended screening tests for an annual wellness exam were reviewed in full today.  Brought up to date unless services declined.  Counselled on the importance of diet, exercise, and its role in overall health and mortality.  The patient's FH and SH was reviewed, including their home life, tobacco status, and drug and alcohol status.   Due for PSA.No prostate cancer in family. rectal exam today. Will come back for pSA in 3 months.. Lab Results  Component Value Date   PSA 1.02 11/08/2012   PSA 0.75 07/13/2010   PSA 1.35 03/29/2009  Vaccines: Uptodate with PNA and Td, Not interested in shingles. Given prevnar today Colon: 2010 Dr. Deatra Ina  Tubular adenoma. Repeat due in 5 years. Remote smoker < 25 years.

## 2014-12-22 NOTE — Patient Instructions (Addendum)
Get back to regular exercise as planned, decrease carb in diet. Work on weight loss. Return check for follow fasting labs prior in 3 months. Call Dr. Kelby Fam office to set up colonoscopy if needed.

## 2014-12-22 NOTE — Assessment & Plan Note (Signed)
Poor control on no med. Pt recently much better controlled prior to recent shoulder surgery, weight gain and inactivity.  he wishes to work aggressively on lifestylechange then recheck labs in 3 months.

## 2014-12-22 NOTE — Assessment & Plan Note (Signed)
LDL almost at goal < 70 on crestor. Counseling provided for low chol diet.

## 2014-12-23 ENCOUNTER — Ambulatory Visit: Payer: Medicare Other

## 2014-12-23 ENCOUNTER — Other Ambulatory Visit: Payer: Self-pay

## 2014-12-23 NOTE — Addendum Note (Signed)
Addended by: Marchia Bond on: 12/23/2014 03:44 PM   Modules accepted: Miquel Dunn

## 2014-12-23 NOTE — Patient Outreach (Signed)
    12/23/2014   Tommy Sullivan 1946-10-02 [id number]   Member called about scheduled appointment for today.  States he saw his MD yesterday and he is to see the eye doctor and physical therapy today.   Instructed he can see Link to Well[facility] in April.  Appointment rescheduled for 01/20/15.  Peter Garter RN, [facility] Care Management [phone]

## 2014-12-30 DIAGNOSIS — H40002 Preglaucoma, unspecified, left eye: Secondary | ICD-10-CM | POA: Diagnosis not present

## 2014-12-30 LAB — HM DIABETES EYE EXAM

## 2015-01-01 ENCOUNTER — Encounter
Admit: 2015-01-01 | Disposition: A | Discharge status: Home / Self Care | Payer: Self-pay | Attending: Orthopedic Surgery | Admitting: Orthopedic Surgery

## 2015-01-04 ENCOUNTER — Encounter: Payer: Self-pay | Admitting: Family Medicine

## 2015-01-04 DIAGNOSIS — Z9889 Other specified postprocedural states: Secondary | ICD-10-CM | POA: Diagnosis not present

## 2015-01-20 ENCOUNTER — Other Ambulatory Visit: Payer: Self-pay

## 2015-01-20 VITALS — BP 128/80 | HR 60 | Ht 70.5 in | Wt 237.9 lb

## 2015-01-20 DIAGNOSIS — E119 Type 2 diabetes mellitus without complications: Secondary | ICD-10-CM

## 2015-01-20 NOTE — Patient Instructions (Signed)
1. Plan to continue to limit carbohydrates.  Plan to eat protein with each meal.  Try to eat 45-60 Gm of carbohydrate each meal and 15Gm for snacks 2. Plan to check blood sugar 1- 2 times a day - fasting or 1 -2hrs after a meal.  Goals of 80-130 fasting or less than 180 after meals 3. Plan to exercise 3-4 times a week for 15 minutes on elliptical machine.  Try to work up to 150 minutes a week  4. Plan to complete EMMI programs by 02/21/15 5. Plan to return to [facility] to [facility] on July 20 , 2016 at 9:30AM

## 2015-01-20 NOTE — Patient Outreach (Signed)
[facility]) Care Management   01/20/2015  KO BARDON 17-Jul-1946 [id number]  Tommy Sullivan is an 69 y.o. male.   Member seen for follow up office visit for Link to Wellness program for self management of Type 2 diabetes  Subjective: Member states that his blood sugars have been higher after his Lt shoulder surgery and he has noticed that the Tramadol he has been taking for pain makes his sugars run higher.  States he has not been able to exercise while he has been recovering.  States he has been going to physical therapy and having dry needling of his shoulder.  States that the needling has really helped with his ROM of his Lt shoulder.    Objective:   Review of Systems  All other systems reviewed and are negative.   Physical Exam  Filed Vitals:   01/20/15 0945  BP: 128/80  Pulse: 60  Pulse oximetry on room air is 98. Filed Weights   01/20/15 0945  Weight: 237 lb 14.4 oz (107.911 kg)    Current Medications:   Current Outpatient Prescriptions  Medication Sig Dispense Refill  . aspirin EC 81 MG tablet Take 1 tablet (81 mg total) by mouth daily. 90 tablet 3  . BYSTOLIC 10 MG tablet TAKE 1 TABLET BY MOUTH DAILY. 90 tablet 1  . Cholecalciferol (VITAMIN D) 2000 UNITS CAPS Take by mouth daily.    . CRESTOR 10 MG tablet TAKE 1 TABLET BY MOUTH DAILY. 90 tablet 1  . fenofibrate 160 MG tablet TAKE 1 TABLET BY MOUTH DAILY. 90 tablet 1  . losartan (COZAAR) 100 MG tablet Once daily  1  . modafinil (PROVIGIL) 200 MG tablet Take 1 tablet (200 mg total) by mouth daily. Once daily 90 tablet 1  . Omega-3 Fatty Acids (FISH OIL) 1000 MG CAPS Take by mouth 2 (two) times daily.    . tamsulosin (FLOMAX) 0.4 MG CAPS capsule TAKE 1 CAPSULE BY MOUTH DAILY. 90 capsule 1  . timolol (BETIMOL) 0.5 % ophthalmic solution Place 1 drop into the left eye daily.    . traMADol (ULTRAM) 50 MG tablet Take 50 mg by mouth every 6 (six) hours as needed.     No current facility-administered  medications for this visit.    Functional Status:   In your present state of health, do you have any difficulty performing the following activities: 01/20/2015 05/07/2014  Hearing? N N  Vision? N N  Difficulty concentrating or making decisions? N N  Walking or climbing stairs? N N  Dressing or bathing? N N  Doing errands, shopping? N Y    Fall/Depression Screening:    PHQ 2/9 Scores 01/20/2015 12/22/2014 04/13/2014 12/09/2013  PHQ - 2 Score 0 0 0 0   THN CM Care Plan Problem One        Patient Outreach from 01/20/2015 in [place] Problem One  Elevated blood sugars related to dx of Type 2 DM as evidenced by hemoglobin A1C of 8.0   Care Plan for Problem One  Active   THN Long Term Goal Start Date  01/20/15   Interventions for Problem One Long Term Goal  Reviewed CHO counting and portion control, Instructed to try to limit CHO at breakfast to 45-60 Gm and to have protein with each meal, Reinforced on the importance of  regular exercise to glycemic control, Discussed possiiblity of going on medications if his  blood sugars do not improve   THN  CM Short Term Goal #1 (0-30 days)  Member will complete EMMI programs by 02/21/15 as evidenced by completion record   THN CM Short Term Goal #1 Start Date  01/20/15   Interventions for Short Term Goal #1  Reviewed on how to access EMMI programs      Assessment:  Member seen for self management program for Type 2 DM, Members blood sugars have been ranging higher up to 200 since surgery in February.  Member has good knowledge of CHO counting but has been eating more CHO at breakfast with very little protein, His activity and exercise has been decreased while recovering from Lt shoulder surgery  Plan:  Plan to continue to limit carbohydrates.  Plan to eat protein with each meal.  Try to eat 45-60 Gm of carbohydrate each meal and 15Gm for snacks Plan to check blood sugar 1- 2 times a day - fasting or 1 -2hrs after a meal.  Goals of  80-130 fasting or less than 180 after meals Plan to exercise 3-4 times a week for 15 minutes on elliptical machine.  Try to work up to 150 minutes a week  Plan to complete EMMI programs by 02/21/15 Plan to return to [facility] to Wellness on July 20 , 2016 at 9:30AM

## 2015-02-04 ENCOUNTER — Other Ambulatory Visit: Payer: Self-pay | Admitting: Family Medicine

## 2015-03-04 ENCOUNTER — Encounter: Payer: Self-pay | Admitting: Gastroenterology

## 2015-03-04 ENCOUNTER — Telehealth: Payer: Self-pay | Admitting: Gastroenterology

## 2015-03-04 NOTE — Telephone Encounter (Signed)
PCP is right. He is due a colonoscopy.

## 2015-03-12 ENCOUNTER — Other Ambulatory Visit: Payer: 59

## 2015-03-18 ENCOUNTER — Ambulatory Visit: Payer: 59 | Admitting: Family Medicine

## 2015-04-21 ENCOUNTER — Ambulatory Visit: Payer: 59

## 2015-04-26 ENCOUNTER — Other Ambulatory Visit (INDEPENDENT_AMBULATORY_CARE_PROVIDER_SITE_OTHER): Payer: 59

## 2015-04-26 DIAGNOSIS — Z125 Encounter for screening for malignant neoplasm of prostate: Secondary | ICD-10-CM

## 2015-04-26 DIAGNOSIS — E119 Type 2 diabetes mellitus without complications: Secondary | ICD-10-CM

## 2015-04-26 DIAGNOSIS — R7989 Other specified abnormal findings of blood chemistry: Secondary | ICD-10-CM

## 2015-04-26 DIAGNOSIS — E785 Hyperlipidemia, unspecified: Secondary | ICD-10-CM | POA: Diagnosis not present

## 2015-04-26 LAB — COMPREHENSIVE METABOLIC PANEL
ALT: 19 U/L (ref 0–53)
AST: 19 U/L (ref 0–37)
Albumin: 3.8 g/dL (ref 3.5–5.2)
Alkaline Phosphatase: 22 U/L — ABNORMAL LOW (ref 39–117)
BUN: 11 mg/dL (ref 6–23)
CALCIUM: 9.3 mg/dL (ref 8.4–10.5)
CO2: 29 mEq/L (ref 19–32)
CREATININE: 1.06 mg/dL (ref 0.40–1.50)
Chloride: 104 mEq/L (ref 96–112)
GFR: 73.56 mL/min (ref >60.00)
GLUCOSE: 144 mg/dL — AB (ref 70–99)
POTASSIUM: 4.2 meq/L (ref 3.5–5.1)
SODIUM: 140 meq/L (ref 135–145)
Total Bilirubin: 0.4 mg/dL (ref 0.2–1.2)
Total Protein: 5.9 g/dL — ABNORMAL LOW (ref 6.0–8.3)

## 2015-04-26 LAB — LIPID PANEL
CHOLESTEROL: 122 mg/dL (ref 0–200)
HDL: 28.6 mg/dL — AB (ref >39.00)
NonHDL: 93.4
TRIGLYCERIDES: 206 mg/dL — AB (ref 0.0–149.0)
Total CHOL/HDL Ratio: 4
VLDL: 41.2 mg/dL — ABNORMAL HIGH (ref 0.0–40.0)

## 2015-04-26 LAB — HEMOGLOBIN A1C: Hgb A1c MFr Bld: 7.8 % — ABNORMAL HIGH (ref 4.6–6.5)

## 2015-04-26 LAB — LDL CHOLESTEROL, DIRECT: Direct LDL: 70 mg/dL

## 2015-04-26 LAB — PSA: PSA: 0.83 ng/mL (ref 0.10–4.00)

## 2015-04-27 ENCOUNTER — Ambulatory Visit (INDEPENDENT_AMBULATORY_CARE_PROVIDER_SITE_OTHER): Payer: 59 | Admitting: Family Medicine

## 2015-04-27 ENCOUNTER — Encounter: Payer: Self-pay | Admitting: Family Medicine

## 2015-04-27 VITALS — BP 128/80 | HR 58 | Temp 98.8°F | Ht 70.5 in | Wt 241.5 lb

## 2015-04-27 DIAGNOSIS — E785 Hyperlipidemia, unspecified: Secondary | ICD-10-CM

## 2015-04-27 DIAGNOSIS — I10 Essential (primary) hypertension: Secondary | ICD-10-CM | POA: Diagnosis not present

## 2015-04-27 DIAGNOSIS — E119 Type 2 diabetes mellitus without complications: Secondary | ICD-10-CM

## 2015-04-27 DIAGNOSIS — I2510 Atherosclerotic heart disease of native coronary artery without angina pectoris: Secondary | ICD-10-CM

## 2015-04-27 LAB — HM DIABETES FOOT EXAM

## 2015-04-27 NOTE — Assessment & Plan Note (Signed)
LDL at goal on creastor. HDL and trigs remain abnormal  Despite fenofibrate and fish oil.

## 2015-04-27 NOTE — Assessment & Plan Note (Signed)
Inadequate control. Pt refuses med at this time. Will work aggressively on diet and exercise. Reconsider med at next follow up in 3 months.

## 2015-04-27 NOTE — Progress Notes (Signed)
Pre visit review using our clinic review tool, if applicable. No additional management support is needed unless otherwise documented below in the visit note. 

## 2015-04-27 NOTE — Addendum Note (Signed)
Addended by: Eliezer Lofts E on: 04/27/2015 12:16 PM   Modules accepted: Level of Service

## 2015-04-27 NOTE — Patient Instructions (Addendum)
Get back on track with healthy eating, low carb diet. Get back on track with exercise.  Decrease water intake some.  Call if shortess of breath continuing, or let Dr. Fletcher Anon know.

## 2015-04-27 NOTE — Progress Notes (Signed)
   Subjective:    Patient ID: Tommy Sullivan, male    DOB: Jun 22, 1946, 69 y.o.   MRN: [id number]  HPI  69 year old male presents for 3 month follow up DM , chol and HTN   Elevated Cholesterol: LDL  at < 70 goal Due to CAD, triglycerides better on crestor 10 mg daily, fenofibrate, fish oil   Lab Results  Component Value Date   CHOL 122 04/26/2015   HDL 28.60* 04/26/2015   LDLCALC 74 07/07/2014   LDLDIRECT 70.0 04/26/2015   TRIG 206.0* 04/26/2015   CHOLHDL 4 04/26/2015  Using medications without problems: None Muscle aches: None Diet compliance: good Exercise: off and on, 10 plus steps some days Other complaints:  Diabetes:   Improved control but not at goal on no med. Lab Results  Component Value Date   HGBA1C 7.8* 04/26/2015  Using medications without difficulties: Hypoglycemic episodes: None Hyperglycemic episodes: None Feet problems: None Blood Sugars averaging: FBS not below 120 eye exam within last year:  Hypertension:   Well controlled  on losartan and bystolic  BP Readings from Last 3 Encounters:  04/27/15 128/80  01/20/15 128/80  12/22/14 120/66  Using medication without problems or lightheadedness:  Chest pain with exertion: None Edema: None Short of breath: occ SOB with walking  off and on in last month Average home BPs: not checking Other issues:  Next appt with cardiology Dr. Fletcher Anon 06/08/2015 Last ECHO 07/2014  EF 55-60% He notes weight fluctuates up and down a lot in a day. He drinks a lot of water daily. 60 oz three times a day!! Wt Readings from Last 3 Encounters:  04/27/15 241 lb 8 oz (109.544 kg)  01/20/15 237 lb 14.4 oz (107.911 kg)  12/22/14 241 lb 4 oz (109.43 kg)      Review of Systems  Constitutional: Negative for fever and fatigue.  HENT: Negative for ear pain.   Eyes: Negative for itching.  Respiratory: Negative for cough.   Cardiovascular: Negative for chest pain and leg swelling.  Gastrointestinal: Negative for constipation.         Objective:   Physical Exam  Constitutional: Vital signs are normal. He appears well-developed and well-nourished.  HENT:  Head: Normocephalic.  Right Ear: Hearing normal.  Left Ear: Hearing normal.  Nose: Nose normal.  Mouth/Throat: Oropharynx is clear and moist and mucous membranes are normal.  Neck: Trachea normal. Carotid bruit is not present. No thyroid mass and no thyromegaly present.  Cardiovascular: Normal rate, regular rhythm and normal pulses.  Exam reveals no gallop, no distant heart sounds and no friction rub.   No murmur heard. No peripheral edema  Pulmonary/Chest: Effort normal and breath sounds normal. No respiratory distress.  Skin: Skin is warm, dry and intact. No rash noted.  Psychiatric: He has a normal mood and affect. His speech is normal and behavior is normal. Thought content normal.   Diabetic foot exam: Normal inspection No skin breakdown No calluses  Normal DP pulses Decreased sensation to light touch and monofilament at toes bilaterally. Nails normal        Assessment & Plan:

## 2015-04-27 NOTE — Assessment & Plan Note (Signed)
Well controlled. Continue current medication.  

## 2015-04-28 ENCOUNTER — Ambulatory Visit (AMBULATORY_SURGERY_CENTER): Payer: Self-pay | Admitting: *Deleted

## 2015-04-28 VITALS — Ht 70.5 in | Wt 237.0 lb

## 2015-04-28 DIAGNOSIS — Z860100 Personal history of colon polyps, unspecified: Secondary | ICD-10-CM

## 2015-04-28 NOTE — Progress Notes (Signed)
Patient denies any allergies to eggs or soy. Patient denies any problems with anesthesia/sedation. Patient denies any oxygen use at home and does not take any diet/weight loss medications. EMMI education assisgned to patient on colonoscopy, this was explained and instructions given to patient. 

## 2015-04-30 ENCOUNTER — Ambulatory Visit: Payer: 59 | Admitting: Family Medicine

## 2015-05-07 ENCOUNTER — Other Ambulatory Visit: Payer: Self-pay | Admitting: Family Medicine

## 2015-05-12 ENCOUNTER — Encounter: Payer: 59 | Admitting: Gastroenterology

## 2015-05-13 ENCOUNTER — Other Ambulatory Visit: Payer: Self-pay | Admitting: Family Medicine

## 2015-05-13 ENCOUNTER — Telehealth: Payer: Self-pay | Admitting: Pulmonary Disease

## 2015-05-13 MED ORDER — MODAFINIL 200 MG PO TABS: 200.0000 mg | ORAL_TABLET | Freq: Every day | ORAL | Status: DC

## 2015-05-13 NOTE — Telephone Encounter (Signed)
rx called in to pharmacy.  Pt aware.  Nothing further needed.  

## 2015-05-13 NOTE — Telephone Encounter (Signed)
Called spoke with pt. He was not aware Quamba no longer with out practice. He last saw him 10/27/14 and told him to f/u in 1 year. Pt is needing refill on provigil 200 mg Last refilled 10/27/14 #90 x 1 refill Take 1 tablet (200 mg total) by mouth daily. Once daily  Please advise Dr. Annamaria Boots if okay to refill? thanks

## 2015-05-13 NOTE — Telephone Encounter (Signed)
Ok to refill as before 

## 2015-05-19 ENCOUNTER — Other Ambulatory Visit: Payer: Self-pay

## 2015-05-19 VITALS — BP 108/60 | HR 54 | Resp 16 | Ht 70.5 in | Wt 244.5 lb

## 2015-05-19 DIAGNOSIS — E119 Type 2 diabetes mellitus without complications: Secondary | ICD-10-CM

## 2015-05-19 NOTE — Patient Instructions (Signed)
1. Plan to continue to limit carbohydrates.  Plan to eat protein with each meal.  Try to eat 45-60 Gm of carbohydrate each meal and 15Gm for snacks.  Plan to eat protein with snacks 2. Plan to check blood sugar 1- 2 times a day - fasting or 1 -2hrs after a meal.  Goals of 80-130 fasting or less than 180 after meals 3. Plan to exercise 3-4 times a week for 30 minutes on elliptical machine.  Try to work up to 150 minutes a week  4. Plan to keep appointment with Dr. Valorie Roosevelt on 07/27/15 5. Plan to complete EMMI programs by 08/03/15 6. Plan to return to [facility] on November 30 , 2016 at 10 AM at [facility]

## 2015-05-19 NOTE — Patient Outreach (Signed)
[facility]) Care Management   05/19/2015  BODHI MORADI 01-07-1946 [id number]  HYDE SIRES is an 69 y.o. male   Member seen for follow up office visit for Link to Wellness program for self management of Type 2 diabetes  Subjective: Member states that he is trying to get his hemoglobin A1C down without going on medications.  States he has discussed with his MD at his last visit last month and he will consider medication if his reading does not improve.  States he is trying to watch is diet and exercise.  States he has been more active working around his [place].   Objective:   Review of Systems  All other systems reviewed and are negative.   Physical Exam  Today's Vitals   05/19/15 0958  BP: 108/60  Pulse: 54  Resp: 16  Height: 1.791 m (5' 10.5")  Weight: 244 lb 8 oz (110.904 kg)  SpO2: 98%  PainSc: 0-No pain    Current Medications:   Current Outpatient Prescriptions  Medication Sig Dispense Refill  . aspirin EC 81 MG tablet Take 1 tablet (81 mg total) by mouth daily. 90 tablet 3  . BYSTOLIC 10 MG tablet TAKE 1 TABLET BY MOUTH DAILY. 90 tablet 1  . Cholecalciferol (VITAMIN D) 2000 UNITS CAPS Take by mouth daily.    . CRESTOR 10 MG tablet TAKE 1 TABLET BY MOUTH DAILY. 90 tablet 3  . fenofibrate 160 MG tablet TAKE 1 TABLET BY MOUTH DAILY. 90 tablet 1  . losartan (COZAAR) 100 MG tablet TAKE ONE TABLET BY MOUTH DAILY 90 tablet 1  . modafinil (PROVIGIL) 200 MG tablet Take 1 tablet (200 mg total) by mouth daily. Once daily 90 tablet 1  . Omega-3 Fatty Acids (FISH OIL) 1000 MG CAPS Take by mouth 2 (two) times daily.    . polyethylene glycol powder (MIRALAX) powder Take 1 Container by mouth once.    . tamsulosin (FLOMAX) 0.4 MG CAPS capsule TAKE 1 CAPSULE BY MOUTH DAILY. 90 capsule 1  . timolol (BETIMOL) 0.5 % ophthalmic solution Place 1 drop into the left eye daily.    . bisacodyl (DULCOLAX) 5 MG EC tablet Take 5 mg by mouth once.     No current  facility-administered medications for this visit.    Functional Status:   In your present state of health, do you have any difficulty performing the following activities: 05/19/2015 01/20/2015  Hearing? N N  Vision? N N  Difficulty concentrating or making decisions? N N  Walking or climbing stairs? N N  Dressing or bathing? N N  Doing errands, shopping? N N    Fall/Depression Screening:    PHQ 2/9 Scores 05/19/2015 01/20/2015 12/22/2014 04/13/2014 12/09/2013  PHQ - 2 Score 0 0 0 0 0    THN CM Care Plan Problem One        Patient Outreach from 05/19/2015 in [place] Problem One  Elevated blood sugars related to dx of Type 2 DM as evidenced by hemoglobin A1C of 7.8   Care Plan for Problem One  Active   THN Long Term Goal (31-90 days)  Member will decrease his hemoglobin A1C to 7.0 or below in the next 90 days   THN Long Term Goal Start Date  05/19/15 [facility] not meeting goal with last hemoglobin A1C 7.8]   Interventions for Problem One Long Term Goal  Reviewed CHO counting and portion control, Reinforced  to have protein with  each meal and snacks, Reinforced on the importance of  regular exercise to glycemic control, Discussed different  medications his MD might put him on if his hemoglobin A1C does not improve   THN CM Short Term Goal #1 (0-30 days)  Member will complete EMMI programs by 02/21/15 as evidenced by completion record   THN CM Short Term Goal #1 Start Date  01/20/15   [facility] CM Short Term Goal #1 Met Date  02/21/15 [completed EMMI programs]      Assessment:   Member seen for follow up office visit for Link to Wellness program for self management of Type 2 diabetes.  Member continues to not meet hemoglobin A1C of 7 with last reading of 7.8.  Member has been following lower calorie diet but but is not eating protein with snacks.  He reports being very active with heavy yard work and repairs at his [place] and home he is preparing to sell.    Plan:  Plan to  continue to limit carbohydrates.  Plan to eat protein with each meal.  Try to eat 45-60 Gm of carbohydrate each meal and 15Gm for snacks.  Plan to eat protein with snacks Plan to check blood sugar 1- 2 times a day - fasting or 1 -2hrs after a meal.  Goals of 80-130 fasting or less than 180 after meals Plan to exercise 3-4 times a week for 30 minutes on elliptical machine.  Try to work up to 150 minutes a week  Plan to keep appointment with Dr. Valorie Roosevelt on 07/27/15 Plan to complete EMMI programs by 08/03/15 Plan to return to Link to Wellness on November 30 , 2016 at 10 AM at [place], [facility] Care Management Coordinator-Link to [place] Management [phone]

## 2015-05-25 ENCOUNTER — Ambulatory Visit (AMBULATORY_SURGERY_CENTER): Payer: 59 | Admitting: Gastroenterology

## 2015-05-25 ENCOUNTER — Encounter: Payer: Self-pay | Admitting: Gastroenterology

## 2015-05-25 VITALS — BP 102/62 | HR 51 | Temp 95.8°F | Resp 21 | Ht 70.5 in | Wt 237.0 lb

## 2015-05-25 DIAGNOSIS — D124 Benign neoplasm of descending colon: Secondary | ICD-10-CM

## 2015-05-25 DIAGNOSIS — Z86010 Personal history of colonic polyps: Secondary | ICD-10-CM

## 2015-05-25 DIAGNOSIS — K635 Polyp of colon: Secondary | ICD-10-CM | POA: Diagnosis not present

## 2015-05-25 DIAGNOSIS — K5140 Inflammatory polyps of colon without complications: Secondary | ICD-10-CM | POA: Diagnosis not present

## 2015-05-25 DIAGNOSIS — K5730 Diverticulosis of large intestine without perforation or abscess without bleeding: Secondary | ICD-10-CM

## 2015-05-25 MED ORDER — SODIUM CHLORIDE 0.9 % IV SOLN: 500.0000 mL | INTRAVENOUS | Status: DC

## 2015-05-25 NOTE — Progress Notes (Signed)
Called to room to assist during endoscopic procedure.  Patient ID and intended procedure confirmed with present staff. Received instructions for my participation in the procedure from the performing physician.  

## 2015-05-25 NOTE — Patient Instructions (Signed)
YOU HAD AN ENDOSCOPIC PROCEDURE TODAY AT [facility]:   Refer to the procedure report that was given to you for any specific questions about what was found during the examination.  If the procedure report does not answer your questions, please call your gastroenterologist to clarify.  If you requested that your care partner not be given the details of your procedure findings, then the procedure report has been included in a sealed envelope for you to review at your convenience later.  YOU SHOULD EXPECT: Some feelings of bloating in the abdomen. Passage of more gas than usual.  Walking can help get rid of the air that was put into your GI tract during the procedure and reduce the bloating. If you had a lower endoscopy (such as a colonoscopy or flexible sigmoidoscopy) you may notice spotting of blood in your stool or on the toilet paper. If you underwent a bowel prep for your procedure, you may not have a normal bowel movement for a few days.  Please Note:  You might notice some irritation and congestion in your nose or some drainage.  This is from the oxygen used during your procedure.  There is no need for concern and it should clear up in a day or so.  SYMPTOMS TO REPORT IMMEDIATELY:   Following lower endoscopy (colonoscopy or flexible sigmoidoscopy):  Excessive amounts of blood in the stool  Significant tenderness or worsening of abdominal pains  Swelling of the abdomen that is new, acute  Fever of 100F or higher   For urgent or emergent issues, a gastroenterologist can be reached at any hour by calling [phone].   DIET: Your first meal following the procedure should be a small meal and then it is ok to progress to your normal diet. Heavy or fried foods are harder to digest and may make you feel nauseous or bloated.  Likewise, meals heavy in dairy and vegetables can increase bloating.  Drink plenty of fluids but you should avoid alcoholic beverages for 24  hours.  ACTIVITY:  You should plan to take it easy for the rest of today and you should NOT DRIVE or use heavy machinery until tomorrow (because of the sedation medicines used during the test).    FOLLOW UP: Our staff will call the number listed on your records the next business day following your procedure to check on you and address any questions or concerns that you may have regarding the information given to you following your procedure. If we do not reach you, we will leave a message.  However, if you are feeling well and you are not experiencing any problems, there is no need to return our call.  We will assume that you have returned to your regular daily activities without incident.  If any biopsies were taken you will be contacted by phone or by letter within the next 1-3 weeks.  Please call us at [phone] if you have not heard about the biopsies in 3 weeks.    SIGNATURES/CONFIDENTIALITY: You and/or your care partner have signed paperwork which will be entered into your electronic medical record.  These signatures attest to the fact that that the information above on your After Visit Summary has been reviewed and is understood.  Full responsibility of the confidentiality of this discharge information lies with you and/or your care-partner.  Polyp/ Diverticulosis handout given Await pathology report

## 2015-05-25 NOTE — Progress Notes (Signed)
Report to PACU, RN, vss, BBS= Clear.  

## 2015-05-25 NOTE — Op Note (Signed)
[facility]  [address] [place], [id number]   COLONOSCOPY PROCEDURE REPORT  PATIENT: Tommy Sullivan, Tommy Sullivan  MR#: [id number] BIRTHDATE: 06-29-1946 , 62  yrs. old GENDER: male ENDOSCOPIST: Inda Castle, MD REFERRED BY:Amy Cletis [place], M.D. PROCEDURE DATE:  05/25/2015 PROCEDURE:   Colonoscopy, surveillance and Colonoscopy with snare polypectomy First Screening Colonoscopy - Avg.  risk and is 50 yrs.  old or older - No.  Prior Negative Screening - Now for repeat screening. N/A  History of Adenoma - Now for follow-up colonoscopy & has been > or = to 3 yrs.  Yes hx of adenoma.  Has been 3 or more years since last colonoscopy.  Polyps removed today? Yes ASA CLASS:   Class II INDICATIONS:PH Colon Adenoma. 2010 MEDICATIONS: Monitored anesthesia care and Propofol 200 mg IV  DESCRIPTION OF PROCEDURE:   After the risks benefits and alternatives of the procedure were thoroughly explained, informed consent was obtained.  The digital rectal exam revealed no abnormalities of the rectum.   The [id number]  endoscope was introduced through the anus and advanced to the cecum, which was identified by both the appendix and ileocecal valve. No adverse events experienced.   The quality of the prep was (Suprep was used) excellent.  The instrument was then slowly withdrawn as the colon was fully examined. Estimated blood loss is zero unless otherwise noted in this procedure report.      COLON FINDINGS: A sessile polyp measuring 3 mm in size was found in the descending colon.  A polypectomy was performed with a cold snare.  The resection was complete, the polyp tissue was completely retrieved and sent to histology.   There was mild diverticulosis noted in the sigmoid colon.  Retroflexed views revealed no abnormalities. The time to cecum = 4.0 Withdrawal time = 8.8   The scope was withdrawn and the procedure completed. COMPLICATIONS: There were no immediate  complications.  ENDOSCOPIC IMPRESSION: 1.   Sessile polyp was found in the descending colon; polypectomy was performed with a cold snare 2.   Mild diverticulosis was noted in the sigmoid colon  RECOMMENDATIONS: If the polyp(s) removed today are proven to be adenomatous (pre-cancerous) polyps, you will need a repeat colonoscopy in 5 years.  Otherwise you should continue to follow colorectal cancer screening guidelines for "routine risk" patients with colonoscopy in 10 years.  You will receive a letter within 1-2 weeks with the results of your biopsy as well as final recommendations.  Please call my office if you have not received a letter after 3 weeks.  eSigned:  Inda Castle, MD 05/25/2015 10:54 AM   cc:   PATIENT NAME:  Bracen, Schum MR#: [id number]

## 2015-05-26 ENCOUNTER — Telehealth: Payer: Self-pay | Admitting: *Deleted

## 2015-05-26 NOTE — Telephone Encounter (Signed)
No answer, left message to call if questions or concerns. 

## 2015-06-02 ENCOUNTER — Encounter: Payer: Self-pay | Admitting: Gastroenterology

## 2015-06-08 ENCOUNTER — Ambulatory Visit (INDEPENDENT_AMBULATORY_CARE_PROVIDER_SITE_OTHER): Payer: 59 | Admitting: Cardiovascular Disease

## 2015-06-08 ENCOUNTER — Encounter: Payer: Self-pay | Admitting: Cardiovascular Disease

## 2015-06-08 VITALS — BP 108/62 | HR 71 | Ht 70.5 in | Wt 239.8 lb

## 2015-06-08 DIAGNOSIS — E785 Hyperlipidemia, unspecified: Secondary | ICD-10-CM

## 2015-06-08 DIAGNOSIS — I10 Essential (primary) hypertension: Secondary | ICD-10-CM | POA: Diagnosis not present

## 2015-06-08 DIAGNOSIS — I483 Typical atrial flutter: Secondary | ICD-10-CM

## 2015-06-08 DIAGNOSIS — I2510 Atherosclerotic heart disease of native coronary artery without angina pectoris: Secondary | ICD-10-CM | POA: Diagnosis not present

## 2015-06-08 NOTE — Progress Notes (Signed)
Primary care physician: Dr. Diona Browner  HPI  This is a pleasant 69 year old male who is here today for follow-up visit regarding coronary artery disease and atrial flutter.  He has known history of coronary artery disease with previous myocardial infarction  in 2009 with PCI done by Dr. Collene Mares and no cardiac events since then.  He has other chronic medical conditions that include type 2 diabetes, hypertension and hyperlipidemia.  He he was seen in 03/2014 for preoperative cardiovascular evaluation. He was found to be in atrial flutter with controlled ventricular rate. He underwent a nuclear stress test which showed evidence of prior inferior and inferolateral infarct without significant ischemia. Ejection fraction was 47%. He underwent surgery without complications. He underwent atrial flutter ablation in August 2015 by Dr. Caryl Comes. He had recurrent palpitations. 48 hour Holter monitor showed no evidence of atrial fibrillation or flutter. He was noted to have frequent PVCs. Echocardiogram in October 2015 showed an ejection fraction of 55-60%, mildly dilated left atrium and no evidence of pulmonary hypertension. He has been doing well and denies any chest pain or shortness of breath. He reports no palpitations or dizziness.  No Known Allergies   Current Outpatient Prescriptions on File Prior to Visit  Medication Sig Dispense Refill  . aspirin EC 81 MG tablet Take 1 tablet (81 mg total) by mouth daily. 90 tablet 3  . BYSTOLIC 10 MG tablet TAKE 1 TABLET BY MOUTH DAILY. 90 tablet 1  . Cholecalciferol (VITAMIN D) 2000 UNITS CAPS Take by mouth daily.    . CRESTOR 10 MG tablet TAKE 1 TABLET BY MOUTH DAILY. 90 tablet 3  . fenofibrate 160 MG tablet TAKE 1 TABLET BY MOUTH DAILY. 90 tablet 1  . losartan (COZAAR) 100 MG tablet TAKE ONE TABLET BY MOUTH DAILY 90 tablet 1  . modafinil (PROVIGIL) 200 MG tablet Take 1 tablet (200 mg total) by mouth daily. Once daily 90 tablet 1  . Omega-3 Fatty Acids (FISH OIL) 1000  MG CAPS Take by mouth 2 (two) times daily.    . tamsulosin (FLOMAX) 0.4 MG CAPS capsule TAKE 1 CAPSULE BY MOUTH DAILY. 90 capsule 1  . timolol (BETIMOL) 0.5 % ophthalmic solution Place 1 drop into the left eye daily.     No current facility-administered medications on file prior to visit.     Past Medical History  Diagnosis Date  . Hyperlipidemia   . Hypertension   . Allergy   . Glaucoma   . Retinal vascular occlusion, unspecified     Right  . Extrinsic asthma, unspecified   . Coronary atherosclerosis of unspecified type of vessel, native or graft   . Gout   . Diabetes mellitus without complication   . Arthritis   . Myocardial infarction 1/09    PCI done by Dr. Collene Mares in [place]  . Atrial flutter      Past Surgical History  Procedure Laterality Date  . Tonsillectomy    . Appendectomy  1954  . Neck surgery  1990's    x2, Fusion  . Colonoscopy    . Coronary angioplasty  2009    stent [place]  . Shoulder arthroscopy with subacromial decompression Left 04/02/2014    Procedure: SHOULDER ARTHROSCOPY WITH SUBACROMIAL DECOMPRESSION WITH ROTATOR CUFF REPAIR;  Surgeon: Nita Sells, MD;  Location: [facility];  Service: Orthopedics;  Laterality: Left;  Left shoulder rotator cuff repair, subacromial decompression  . Ablation  05-07-2014    EPS and ablation of atrial flutter  by Dr Caryl Comes  . Atrial flutter ablation N/A 05/07/2014    Procedure: ATRIAL FLUTTER ABLATION;  Surgeon: Deboraha Sprang, MD;  Location: [facility] CATH LAB;  Service: Cardiovascular;  Laterality: N/A;  . Skin surgery       Family History  Problem Relation Age of Onset  . Diabetes Sister   . Coronary artery disease Brother   . AAA (abdominal aortic aneurysm) Father   . Heart disease Mother   . Diabetes Mother   . Diabetes Brother   . Colon cancer Neg Hx      Social History   Social History  . Marital Status: Married    Spouse Name: N/A  . Number of Children: N/A  . Years of  Education: N/A   Occupational History  . retired    Social History Main Topics  . Smoking status: Former Smoker -- 1.00 packs/day for 20 years    Types: Cigarettes    Quit date: 10/02/1988  . Smokeless tobacco: Never Used     Comment: quit in the 1990's  . Alcohol Use: 1.8 oz/week    3 Cans of beer per week     Comment: 3-4 drinks per week.   . Drug Use: No  . Sexual Activity: Not on file   Other Topics Concern  . Not on file   Social History Narrative     ROS A 10 point review of system was performed. It is negative other than that mentioned in the history of present illness.   PHYSICAL EXAM   BP 108/62 mmHg  Pulse 71  Ht 5' 10.5" (1.791 m)  Wt 239 lb 12 oz (108.75 kg)  BMI 33.90 kg/m2 Constitutional: He is oriented to person, place, and time. He appears well-developed and well-nourished. No distress.  HENT: No nasal discharge.  Head: Normocephalic and atraumatic.  Eyes: Pupils are equal and round.  No discharge. Neck: Normal range of motion. Neck supple. No JVD present. No thyromegaly present.  Cardiovascular: Normal rate, regular rhythm, normal heart sounds. Exam reveals no gallop and no friction rub. No murmur heard.  Pulmonary/Chest: Effort normal and breath sounds normal. No stridor. No respiratory distress. He has no wheezes. He has no rales. He exhibits no tenderness.  Abdominal: Soft. Bowel sounds are normal. He exhibits no distension. There is no tenderness. There is no rebound and no guarding.  Musculoskeletal: Normal range of motion. He exhibits no edema and no tenderness.  Neurological: He is alert and oriented to person, place, and time. Coordination normal.  Skin: Skin is warm and dry. No rash noted. He is not diaphoretic. No erythema. No pallor.  Psychiatric: He has a normal mood and affect. His behavior is normal. Judgment and thought content normal.       EKG: Sinus  Rhythm  -Old inferior-apical infarct  -IVCD  -  Nonspecific T-abnormality.    ABNORMAL   ASSESSMENT AND PLAN

## 2015-06-08 NOTE — Patient Instructions (Signed)
Medication Instructions: Continue same medications.   Labwork: None.   Procedures/Testing: None.   Follow-Up: 6 months with Dr. Arida.   Any Additional Special Instructions Will Be Listed Below (If Applicable).   

## 2015-06-08 NOTE — Assessment & Plan Note (Signed)
He is doing well overall with no symptoms suggestive of angina. Continue medical therapy. 

## 2015-06-08 NOTE — Assessment & Plan Note (Signed)
Lab Results  Component Value Date   CHOL 122 04/26/2015   HDL 28.60* 04/26/2015   LDLCALC 74 07/07/2014   LDLDIRECT 70.0 04/26/2015   TRIG 206.0* 04/26/2015   CHOLHDL 4 04/26/2015   LDL was at target. However, triglyceride was elevated which I suspect is likely related to not optimally controlled diabetes. I discussed with him the importance of healthy diet and controlling his diabetes.

## 2015-06-08 NOTE — Assessment & Plan Note (Signed)
No evidence of recurrent atrial flutter since ablation last year.

## 2015-06-08 NOTE — Assessment & Plan Note (Signed)
Blood pressure is well controlled on current medications. 

## 2015-07-14 ENCOUNTER — Ambulatory Visit (INDEPENDENT_AMBULATORY_CARE_PROVIDER_SITE_OTHER): Payer: 59

## 2015-07-14 DIAGNOSIS — Z23 Encounter for immunization: Secondary | ICD-10-CM | POA: Diagnosis not present

## 2015-07-22 ENCOUNTER — Other Ambulatory Visit: Payer: Self-pay

## 2015-07-22 ENCOUNTER — Telehealth: Payer: Self-pay | Admitting: Family Medicine

## 2015-07-22 DIAGNOSIS — E119 Type 2 diabetes mellitus without complications: Secondary | ICD-10-CM

## 2015-07-22 DIAGNOSIS — E785 Hyperlipidemia, unspecified: Secondary | ICD-10-CM

## 2015-07-22 NOTE — Telephone Encounter (Signed)
-----   Message from Ellamae Sia sent at 07/16/2015 11:31 AM EDT ----- Regarding: Lab orders for Thursday, 10.20.16 Lab orders for a 3 month follow up appt.

## 2015-07-27 ENCOUNTER — Ambulatory Visit: Payer: Self-pay | Admitting: Family Medicine

## 2015-07-29 ENCOUNTER — Other Ambulatory Visit (INDEPENDENT_AMBULATORY_CARE_PROVIDER_SITE_OTHER): Payer: 59

## 2015-07-29 DIAGNOSIS — E119 Type 2 diabetes mellitus without complications: Secondary | ICD-10-CM

## 2015-07-29 DIAGNOSIS — E785 Hyperlipidemia, unspecified: Secondary | ICD-10-CM | POA: Diagnosis not present

## 2015-07-29 LAB — COMPREHENSIVE METABOLIC PANEL
ALT: 24 U/L (ref 0–53)
AST: 23 U/L (ref 0–37)
Albumin: 4 g/dL (ref 3.5–5.2)
Alkaline Phosphatase: 32 U/L — ABNORMAL LOW (ref 39–117)
BILIRUBIN TOTAL: 0.4 mg/dL (ref 0.2–1.2)
BUN: 17 mg/dL (ref 6–23)
CHLORIDE: 100 meq/L (ref 96–112)
CO2: 31 meq/L (ref 19–32)
Calcium: 9.7 mg/dL (ref 8.4–10.5)
Creatinine, Ser: 1.05 mg/dL (ref 0.40–1.50)
GFR: 74.31 mL/min (ref >60.00)
Glucose, Bld: 257 mg/dL — ABNORMAL HIGH (ref 70–99)
Potassium: 4.3 mEq/L (ref 3.5–5.1)
Sodium: 137 mEq/L (ref 135–145)
Total Protein: 6.9 g/dL (ref 6.0–8.3)

## 2015-07-29 LAB — LIPID PANEL
CHOL/HDL RATIO: 5
Cholesterol: 138 mg/dL (ref 0–200)
HDL: 29.8 mg/dL — ABNORMAL LOW (ref >39.00)
NONHDL: 108.23
TRIGLYCERIDES: 340 mg/dL — AB (ref 0.0–149.0)
VLDL: 68 mg/dL — AB (ref 0.0–40.0)

## 2015-07-29 LAB — LDL CHOLESTEROL, DIRECT: Direct LDL: 70 mg/dL

## 2015-07-29 LAB — HEMOGLOBIN A1C: Hgb A1c MFr Bld: 8.8 % — ABNORMAL HIGH (ref 4.6–6.5)

## 2015-07-30 ENCOUNTER — Other Ambulatory Visit: Payer: 59

## 2015-08-05 ENCOUNTER — Other Ambulatory Visit: Payer: Self-pay | Admitting: Family Medicine

## 2015-08-05 ENCOUNTER — Ambulatory Visit: Payer: 59 | Admitting: Family Medicine

## 2015-08-06 ENCOUNTER — Encounter: Payer: Self-pay | Admitting: Family Medicine

## 2015-08-06 ENCOUNTER — Ambulatory Visit (INDEPENDENT_AMBULATORY_CARE_PROVIDER_SITE_OTHER): Payer: 59 | Admitting: Family Medicine

## 2015-08-06 VITALS — BP 120/80 | HR 76 | Temp 99.0°F | Ht 70.5 in | Wt 240.0 lb

## 2015-08-06 DIAGNOSIS — E785 Hyperlipidemia, unspecified: Secondary | ICD-10-CM | POA: Diagnosis not present

## 2015-08-06 DIAGNOSIS — I10 Essential (primary) hypertension: Secondary | ICD-10-CM | POA: Diagnosis not present

## 2015-08-06 DIAGNOSIS — E119 Type 2 diabetes mellitus without complications: Secondary | ICD-10-CM | POA: Diagnosis not present

## 2015-08-06 DIAGNOSIS — I2510 Atherosclerotic heart disease of native coronary artery without angina pectoris: Secondary | ICD-10-CM

## 2015-08-06 LAB — HM DIABETES FOOT EXAM

## 2015-08-06 MED ORDER — GLUCOSE BLOOD VI STRP: ORAL_STRIP | Status: DC

## 2015-08-06 MED ORDER — METFORMIN HCL ER (MOD) 500 MG PO TB24: 500.0000 mg | ORAL_TABLET | Freq: Every day | ORAL | Status: DC

## 2015-08-06 NOTE — Progress Notes (Signed)
Pre visit review using our clinic review tool, if applicable. No additional management support is needed unless otherwise documented below in the visit note. 

## 2015-08-06 NOTE — Progress Notes (Signed)
69 year old male presents for 3 month follow up DM , chol and HTN  Elevated Cholesterol: LDL at < 70 goal Due to CAD, triglycerides not at goal with sugar up on crestor 10 mg daily, fenofibrate, fish oil   Lab Results  Component Value Date   CHOL 138 07/29/2015   HDL 29.80* 07/29/2015   LDLCALC 74 07/07/2014   LDLDIRECT 70.0 07/29/2015   TRIG 340.0* 07/29/2015   CHOLHDL 5 07/29/2015  Using medications without problems: None Muscle aches: None Diet compliance: poor Exercise:  Had been off diet and exercise.Marland Kitchen Has started back now. Other complaints:  Diabetes: Worsened control, on no med.  Lab Results  Component Value Date   HGBA1C 8.8* 07/29/2015  Using medications without difficulties: Hypoglycemic episodes:? Hyperglycemic episodes: at lab visit Feet problems: None Blood Sugars averaging:not checking at home lately eye exam within last year:  Hypertension:  Well controlled on losartan and bystolic  BP Readings from Last 3 Encounters:  08/06/15 120/80  06/08/15 108/62  05/25/15 102/62  Using medication without problems or lightheadedness:  Chest pain with exertion: None Edema: None Short of breath:  Gets winded more quickly than prior to CAD Average home BPs: not checking Other issues:  Cardiology Dr. Fletcher Anon 06/08/2015 Last ECHO 07/2014 EF 55-60%  Wt Readings from Last 3 Encounters:  08/06/15 240 lb (108.863 kg)  06/08/15 239 lb 12 oz (108.75 kg)  05/25/15 237 lb (107.502 kg)   Review of Systems  Constitutional: Negative for fever and fatigue.  HENT: Negative for ear pain.  Eyes: Negative for itching.  Respiratory: Negative for cough.  Cardiovascular: Negative for chest pain and leg swelling.  Gastrointestinal: Negative for constipation.       Objective:   Physical Exam  Constitutional: Vital signs are normal. He appears well-developed and well-nourished.  HENT:  Head: Normocephalic.  Right Ear: Hearing normal.  Left Ear: Hearing normal.   Nose: Nose normal.  Mouth/Throat: Oropharynx is clear and moist and mucous membranes are normal.  Neck: Trachea normal. Carotid bruit is not present. No thyroid mass and no thyromegaly present.  Cardiovascular: Normal rate, regular rhythm and normal pulses. Exam reveals no gallop, no distant heart sounds and no friction rub.  No murmur heard. No peripheral edema  Pulmonary/Chest: Effort normal and breath sounds normal. No respiratory distress.  Skin: Skin is warm, dry and intact. No rash noted.  Psychiatric: He has a normal mood and affect. His speech is normal and behavior is normal. Thought content normal.   Diabetic foot exam: Normal inspection No skin breakdown No calluses  Normal DP pulses Decreased sensation to light touch and monofilament at toes bilaterally. Nails normal

## 2015-08-06 NOTE — Assessment & Plan Note (Addendum)
Well controlled. Continue current medication. Trigs elevated likely due to high glucose.

## 2015-08-06 NOTE — Patient Instructions (Addendum)
Get back on track with exercise and healthy eating. Check CBGs daily. Fasting goal < 120, 2 hours after meals goal at , 180. Start metformin daily with breakfast.

## 2015-08-06 NOTE — Assessment & Plan Note (Signed)
Well controlled. Continue current medication.  

## 2015-08-06 NOTE — Assessment & Plan Note (Signed)
Poor control. Get back on track with diet and start metformin daily low dose. Follow up in 3 months.

## 2015-09-01 ENCOUNTER — Other Ambulatory Visit: Payer: Self-pay

## 2015-09-01 VITALS — BP 118/72 | HR 76 | Resp 14 | Ht 70.5 in | Wt 243.2 lb

## 2015-09-01 DIAGNOSIS — E119 Type 2 diabetes mellitus without complications: Secondary | ICD-10-CM

## 2015-09-01 NOTE — Patient Instructions (Signed)
1. Plan to limit carbohydrates.  Plan to eat protein with each meal.  Try to eat 45-60 Gm of carbohydrate each meal and 15Gm for snacks.  Plan to eat protein with snacks 2. Plan to check blood sugar 2 times a day - fasting or 1 -2hrs after a meal.  Goals of 80-130 fasting or less than 180 after meals 3. Plan to exercise 3-4 times a week for 30 minutes on elliptical machine. Plan to do strength training 4 times a week.  Try to work up to 150 minutes a week  4. Plan to keep appointment with Dr. Valorie Roosevelt on 11/11/15 5. Plan to complete EMMI programs by 11/03/15 6. Plan to return to [facility] on 12/01/15 at 10 AM at [facility]

## 2015-09-01 NOTE — Patient Outreach (Signed)
Tommy Sullivan) Care Management   09/01/2015  Tommy Sullivan 03-06-1946 [id number]  Tommy Sullivan is an 69 y.o. male.   Member seen for follow up office visit for [facility] program for self management of Type 2 diabetes  Subjective: Member states that he has not been watching his diet closely and he had not been exercising like he had been doing.  States that his hemoglobin A1C was up to 8.8 when he saw Dr.Bedsole earlier this month.  States that he is now taking metformin which he started about 2 wks ago.  States his blood sugars have ranged from 180-240.  States he has not noticed a change in his readings since starting the medications but he had not started to be "back on plan" until yesterday.    Objective:   Review of Systems  All other systems reviewed and are negative. Member did not bring glucometer or log to visit  Physical Exam  Today's Vitals   09/01/15 1012  BP: 118/72  Pulse: 76  Resp: 14  Height: 1.791 m (5' 10.5")  Weight: 243 lb 3.2 oz (110.315 kg)  SpO2: 97%  PainSc: 0-No pain    Current Medications:   Current Outpatient Prescriptions  Medication Sig Dispense Refill  . aspirin EC 81 MG tablet Take 1 tablet (81 mg total) by mouth daily. 90 tablet 3  . Blood Glucose Monitoring Suppl (BAYER CONTOUR NEXT MONITOR) W/DEVICE KIT 1 each by Does not apply route. Use to check blood sugar 2 times daily.  Dx: A83.4    . BYSTOLIC 10 MG tablet TAKE 1 TABLET BY MOUTH DAILY. 90 tablet 1  . cholecalciferol (VITAMIN D) 1000 UNITS tablet Take 1,000 Units by mouth daily.    . CRESTOR 10 MG tablet TAKE 1 TABLET BY MOUTH DAILY. 90 tablet 3  . fenofibrate 160 MG tablet TAKE 1 TABLET BY MOUTH DAILY. 90 tablet 1  . glucose blood (BAYER CONTOUR NEXT TEST) test strip Use to check blood sugar two times daily.  Dx: E11.9 100 each 12  . losartan (COZAAR) 100 MG tablet TAKE ONE TABLET BY MOUTH DAILY 90 tablet 1  . metFORMIN (GLUMETZA) 500 MG (MOD) 24 hr tablet  Take 1 tablet (500 mg total) by mouth daily with breakfast. 90 tablet 3  . modafinil (PROVIGIL) 200 MG tablet Take 1 tablet (200 mg total) by mouth daily. Once daily 90 tablet 1  . Omega-3 Fatty Acids (FISH OIL) 1000 MG CAPS Take 2 capsules by mouth 2 (two) times daily.     . tamsulosin (FLOMAX) 0.4 MG CAPS capsule TAKE 1 CAPSULE BY MOUTH DAILY. 90 capsule 1  . timolol (BETIMOL) 0.5 % ophthalmic solution Place 1 drop into the left eye daily.    . Cholecalciferol (VITAMIN D) 2000 UNITS CAPS Take by mouth daily.     No current facility-administered medications for this visit.    Functional Status:   In your present state of health, do you have any difficulty performing the following activities: 09/01/2015 05/19/2015  Hearing? N N  Vision? N N  Difficulty concentrating or making decisions? N N  Walking or climbing stairs? - N  Dressing or bathing? N N  Doing errands, shopping? N N    Fall/Depression Screening:    PHQ 2/9 Scores 09/01/2015 05/19/2015 01/20/2015 12/22/2014 04/13/2014 12/09/2013  PHQ - 2 Score 0 0 0 0 0 0    Assessment:   Member seen for follow up office visit for [facility] [employer]  program for self management of Type 2 diabetes.  Member not meeting diabetes self management goal of hemoglobin A1C of 7 or less with last reading 8.8.  Member has not been adherent with diet or exercise for the last few months.Reports blood sugars ranging from 180-240.   Member started on Metformin and is to see MD on 11/11/15 to have labs rechecked.    Plan:  Plan to limit carbohydrates.  Plan to eat protein with each meal.  Try to eat 45-60 Gm of carbohydrate each meal and 15Gm for snacks.  Plan to eat protein with snacks Plan to check blood sugar 2 times a day - fasting or 1 -2hrs after a meal.  Goals of 80-130 fasting or less than 180 after meals Plan to exercise 3-4 times a week for 30 minutes on elliptical machine. Plan to do strength training 4 times a week.  Try to work up to 150 minutes a week   Plan to keep appointment with Dr. Valorie Roosevelt on 11/11/15 Plan to complete EMMI programs by 11/03/15 Plan to return to [facility] on 12/01/15 at 10 AM at [place] Problem One        Most Recent Value   Care Plan Problem One  Elevated blood sugars related to dx of Type 2 DM as evidenced by hemoglobin A1C of 7.8   Role Documenting the Problem One  Care Management [place] for Problem One  Active   [facility] Long Term Goal (31-90 days)  Member will decrease his hemoglobin A1C to 7.0 or below in the next 90 days   [facility] Long Term Goal Start Date  09/01/15 [facility] not meeting goal with last hemoglobin A1C 8.8]   Interventions for Problem One Long Term Goal  Encouraged to go back to watching diet closely and exercising, Reviewed CHO counting and portion control, Reinforced  to have protein with each meal and snacks, Reinforced on the importance of  regular exercise to glycemic control, Instructed on use and possible side effects of metformin, Discussed progressive nature of diabetes and that he might need to be on medication when he gets his numbers under control    Tommy Garter RN, [place] [facility] Care Management Coordinator-[facility] [place] [facility] [phone]

## 2015-09-23 ENCOUNTER — Telehealth: Payer: Self-pay | Admitting: Cardiovascular Disease

## 2015-09-23 NOTE — Telephone Encounter (Signed)
Patient called to schedule early 6 m fu as he wants to discuss some issues.  Patient prompted for details but declined to discuss.  Says he will keep an eye on it and February appt sounds perfect.

## 2015-09-23 NOTE — Telephone Encounter (Signed)
Noted  

## 2015-10-28 ENCOUNTER — Ambulatory Visit: Payer: Medicare Other | Admitting: Pulmonary Disease

## 2015-11-01 ENCOUNTER — Other Ambulatory Visit: Payer: Self-pay | Admitting: Family Medicine

## 2015-11-01 ENCOUNTER — Other Ambulatory Visit: Payer: Self-pay | Admitting: Internal Medicine

## 2015-11-01 NOTE — Telephone Encounter (Signed)
CY Please advise. Pt was last seen by [facility] 10-2014 and you gave okay to refill 05-2015 with understanding pt would make yearly follow up with another sleep MD. Pt has not done so. Thanks.

## 2015-11-01 NOTE — Telephone Encounter (Signed)
Ok to refill long enough for him to get back in with any sleep doctor. He was a [facility] patient and has no f/u appointment currently.

## 2015-11-04 ENCOUNTER — Encounter: Payer: Self-pay | Admitting: Internal Medicine

## 2015-11-04 ENCOUNTER — Ambulatory Visit (INDEPENDENT_AMBULATORY_CARE_PROVIDER_SITE_OTHER): Payer: 59 | Admitting: Internal Medicine

## 2015-11-04 VITALS — BP 114/72 | HR 67 | Ht 70.5 in | Wt 237.6 lb

## 2015-11-04 DIAGNOSIS — I2510 Atherosclerotic heart disease of native coronary artery without angina pectoris: Secondary | ICD-10-CM

## 2015-11-04 DIAGNOSIS — G4733 Obstructive sleep apnea (adult) (pediatric): Secondary | ICD-10-CM | POA: Diagnosis not present

## 2015-11-04 MED ORDER — MODAFINIL 200 MG PO TABS: 200.0000 mg | ORAL_TABLET | Freq: Every day | ORAL | Status: DC

## 2015-11-04 NOTE — Assessment & Plan Note (Signed)
He reports cardiac status is currently stable with cardiology follow-up pending

## 2015-11-04 NOTE — Patient Instructions (Signed)
Order- Change DME from [place] to [facility] (patient request), replacement for old CPAP machine Auto 10-20, mask of choice, supplies, humidifier, add AirView      Dx OSA  Renewal script for Provigil

## 2015-11-04 NOTE — Progress Notes (Signed)
   Subjective:    Patient ID: Tommy Sullivan, male    DOB: [birth year], 70 y.o.   MRN: [id number]  HPI 10/27/14-Dr Clance The patient comes in today for follow-up of his obstructive sleep apnea. He also has daytime hypersomnia that may be related to his sleep disordered breathing, but may also be an issue with his sleep hygiene and chronic pain. He feels that he sleeps fairly well with the device, and is having no issues with his mask fit. He prefers to be on the automatic setting, but his machine does not have this available. He thinks it is up for renewal this year.  11/04/2015-70 year old male former smoker followed for OSA, complicated by CAD/Atrial flutter/ MI 2009, DM 2, HBP NPSG 2007:  AHI 16/hr, PLMS 23/hr (?related to SDB?) Titration 2007:  Optimal pressure 16, PLMS still 15/hr Auto 07/2013: optimal pressure 17cm  CPAP 14 [employer]  Former [facility] patient;Pt needs Rx for Provigil. Wears CPAP every night. DME currently [employer]-would like to change [facility] is possible; due for new machine He dislikes the unreliable humidifier on his old machine. Uses CPAP every night with fullface mask. Emphasizes good sleep habits but continues to need Provigil to help with daytime sleepiness. No cataplexy.  Review of Systems  Constitutional: Negative for fever and unexpected weight change.  HENT: Negative for congestion, dental problem, ear pain, nosebleeds, postnasal drip, rhinorrhea, sinus pressure, sneezing, sore throat and trouble swallowing.   Eyes: Negative for redness and itching.  Respiratory: Negative for cough, chest tightness, shortness of breath and wheezing.   Cardiovascular: Negative for palpitations and leg swelling.  Gastrointestinal: Negative for nausea and vomiting.  Genitourinary: Negative for dysuria.  Musculoskeletal: Negative for joint swelling.  Skin: Negative for rash.  Neurological: Negative for headaches.  Hematological: Does not bruise/bleed easily.  Psychiatric/Behavioral: Negative  for dysphoric mood. The patient is not nervous/anxious.      Objective:  OBJ- Physical Exam General- Alert, Oriented, Affect-appropriate, Distress- none acute Skin- rash-none, lesions- none, excoriation- none Lymphadenopathy- none Head- atraumatic            Eyes- Gross vision intact, PERRLA, conjunctivae and secretions clear            Ears- Hearing, canals-normal            Nose- Clear, no-Septal dev, mucus, polyps, erosion, perforation             Throat- Mallampati II-III , mucosa clear , drainage- none, tonsils- atrophic Neck- flexible , trachea midline, no stridor , thyroid nl, carotid no bruit Chest - symmetrical excursion , unlabored           Heart/CV- RRR , no murmur , no gallop  , no rub, nl s1 s2                           - JVD- none , edema- none, stasis changes- none, varices- none           Lung- clear to P&A, wheeze- none, cough- none , dullness-none, rub- none           Chest wall-  Abd-  Br/ Gen/ Rectal- Not done, not indicated Extrem- cyanosis- none, clubbing, none, atrophy- none, strength- nl Neuro- grossly intact to observation   Assessment & Plan:

## 2015-11-04 NOTE — Assessment & Plan Note (Signed)
He needs a replacement for his old machine with malfunctioning humidifier. He asks replacement use auto titration which we discussed. He would also like to change DME company to [facility]

## 2015-11-09 ENCOUNTER — Telehealth: Payer: Self-pay | Admitting: Family Medicine

## 2015-11-09 DIAGNOSIS — E119 Type 2 diabetes mellitus without complications: Secondary | ICD-10-CM

## 2015-11-09 DIAGNOSIS — E785 Hyperlipidemia, unspecified: Secondary | ICD-10-CM

## 2015-11-09 DIAGNOSIS — Z1159 Encounter for screening for other viral diseases: Secondary | ICD-10-CM

## 2015-11-09 DIAGNOSIS — M1A9XX0 Chronic gout, unspecified, without tophus (tophi): Secondary | ICD-10-CM

## 2015-11-09 NOTE — Telephone Encounter (Signed)
-----   Message from Marchia Bond sent at 11/03/2015  4:09 PM EST ----- Regarding: Dm f/u labs Wed 2/8, need orders. Thanks! :-) Please order future dm f/u labs for pt's upcoming lab appt. Thanks Aniceto Boss

## 2015-11-10 ENCOUNTER — Other Ambulatory Visit: Payer: 59

## 2015-11-11 ENCOUNTER — Ambulatory Visit: Payer: 59 | Admitting: Family Medicine

## 2015-11-12 ENCOUNTER — Encounter: Payer: Self-pay | Admitting: Cardiovascular Disease

## 2015-11-12 ENCOUNTER — Ambulatory Visit (INDEPENDENT_AMBULATORY_CARE_PROVIDER_SITE_OTHER): Payer: 59 | Admitting: Cardiovascular Disease

## 2015-11-12 VITALS — BP 121/79 | HR 55 | Ht 70.5 in | Wt 240.5 lb

## 2015-11-12 DIAGNOSIS — I2510 Atherosclerotic heart disease of native coronary artery without angina pectoris: Secondary | ICD-10-CM | POA: Diagnosis not present

## 2015-11-12 DIAGNOSIS — E785 Hyperlipidemia, unspecified: Secondary | ICD-10-CM | POA: Diagnosis not present

## 2015-11-12 DIAGNOSIS — I10 Essential (primary) hypertension: Secondary | ICD-10-CM | POA: Diagnosis not present

## 2015-11-12 DIAGNOSIS — I483 Typical atrial flutter: Secondary | ICD-10-CM | POA: Diagnosis not present

## 2015-11-12 NOTE — Assessment & Plan Note (Signed)
Blood pressure is controlled on current medications. He does have orthostatic dizziness with mild bradycardia. If this continues to be an issue, I will consider decreasing the dose of Bystolic.

## 2015-11-12 NOTE — Assessment & Plan Note (Signed)
No evidence of recurrent arrhythmia since his ablation.

## 2015-11-12 NOTE — Progress Notes (Signed)
Primary care physician: Dr. Diona Browner  HPI  This is a pleasant 70 year old male who is here today for follow-up visit regarding coronary artery disease and atrial flutter status post ablation.  He has known history of coronary artery disease with previous myocardial infarction  in 2009 with PCI done by Dr. Collene Mares and no cardiac events since then.  He has other chronic medical conditions that include type 2 diabetes, hypertension and hyperlipidemia.  Most recent nuclear stress test showed evidence of prior inferior and inferolateral infarct without significant ischemia. Ejection fraction was 47%.  He underwent atrial flutter ablation in August 2015 by Dr. Caryl Comes. Echocardiogram in October 2015 showed an ejection fraction of 55-60%, mildly dilated left atrium and no evidence of pulmonary hypertension. He has been doing well and denies any chest pain or shortness of breath. He reports chronic orthostatic dizziness. His diabetes was not controlled recently and he was started on metformin. His triglyceride was also elevated.  No Known Allergies   Current Outpatient Prescriptions on File Prior to Visit  Medication Sig Dispense Refill  . aspirin EC 81 MG tablet Take 1 tablet (81 mg total) by mouth daily. 90 tablet 3  . Blood Glucose Monitoring Suppl (BAYER CONTOUR NEXT MONITOR) W/DEVICE KIT 1 each by Does not apply route. Use to check blood sugar 2 times daily.  Dx: G31.5    . BYSTOLIC 10 MG tablet TAKE 1 TABLET BY MOUTH DAILY. 90 tablet 1  . Cholecalciferol (VITAMIN D3) 1000 units CAPS Take 1 capsule by mouth daily.    . CRESTOR 10 MG tablet TAKE 1 TABLET BY MOUTH DAILY. 90 tablet 3  . fenofibrate 160 MG tablet TAKE 1 TABLET BY MOUTH DAILY. 90 tablet 1  . glucose blood (BAYER CONTOUR NEXT TEST) test strip Use to check blood sugar two times daily.  Dx: E11.9 100 each 12  . losartan (COZAAR) 100 MG tablet TAKE ONE TABLET BY MOUTH DAILY 90 tablet 1  . metFORMIN (GLUMETZA) 500 MG (MOD) 24 hr tablet Take 1  tablet (500 mg total) by mouth daily with breakfast. 90 tablet 3  . modafinil (PROVIGIL) 200 MG tablet Take 1 tablet (200 mg total) by mouth daily. Once daily 90 tablet 3  . Omega-3 Fatty Acids (FISH OIL) 1000 MG CAPS Take 2 capsules by mouth 2 (two) times daily.     . tamsulosin (FLOMAX) 0.4 MG CAPS capsule TAKE 1 CAPSULE BY MOUTH DAILY. 90 capsule 1  . timolol (BETIMOL) 0.5 % ophthalmic solution Place 1 drop into the left eye daily.     No current facility-administered medications on file prior to visit.     Past Medical History  Diagnosis Date  . Hyperlipidemia   . Hypertension   . Allergy   . Glaucoma   . Retinal vascular occlusion, unspecified     Right  . Extrinsic asthma, unspecified   . Coronary atherosclerosis of unspecified type of vessel, native or graft   . Gout   . Diabetes mellitus without complication ([place])   . Arthritis   . Myocardial infarction [facility]) 1/09    PCI done by Dr. Collene Mares in [place]  . Atrial flutter [facility])      Past Surgical History  Procedure Laterality Date  . Tonsillectomy    . Appendectomy  1954  . Neck surgery  1990's    x2, Fusion  . Colonoscopy    . Coronary angioplasty  2009    stent [place]  . Shoulder arthroscopy with subacromial  decompression Left 04/02/2014    Procedure: SHOULDER ARTHROSCOPY WITH SUBACROMIAL DECOMPRESSION WITH ROTATOR CUFF REPAIR;  Surgeon: Nita Sells, MD;  Location: [place];  Service: Orthopedics;  Laterality: Left;  Left shoulder rotator cuff repair, subacromial decompression  . Ablation  05-07-2014    EPS and ablation of atrial flutter by Dr Caryl Comes  . Atrial flutter ablation N/A 05/07/2014    Procedure: ATRIAL FLUTTER ABLATION;  Surgeon: Deboraha Sprang, MD;  Location: [facility] CATH LAB;  Service: Cardiovascular;  Laterality: N/A;  . Skin surgery       Family History  Problem Relation Age of Onset  . Diabetes Sister   . Coronary artery disease Brother   . AAA (abdominal aortic  aneurysm) Father   . Heart disease Mother   . Diabetes Mother   . Diabetes Brother   . Colon cancer Neg Hx      Social History   Social History  . Marital Status: Married    Spouse Name: N/A  . Number of Children: N/A  . Years of Education: N/A   Occupational History  . retired    Social History Main Topics  . Smoking status: Former Smoker -- 1.00 packs/day for 20 years    Types: Cigarettes    Quit date: 10/02/1988  . Smokeless tobacco: Never Used     Comment: quit in the 1990's  . Alcohol Use: 1.8 oz/week    3 Cans of beer per week     Comment: 3-4 drinks per week.   . Drug Use: No  . Sexual Activity: Not on file   Other Topics Concern  . Not on file   Social History Narrative     ROS A 10 point review of system was performed. It is negative other than that mentioned in the history of present illness.   PHYSICAL EXAM   BP 121/79 mmHg  Pulse 55  Ht 5' 10.5" (1.791 m)  Wt 240 lb 8 oz (109.09 kg)  BMI 34.01 kg/m2 Constitutional: He is oriented to person, place, and time. He appears well-developed and well-nourished. No distress.  HENT: No nasal discharge.  Head: Normocephalic and atraumatic.  Eyes: Pupils are equal and round.  No discharge. Neck: Normal range of motion. Neck supple. No JVD present. No thyromegaly present.  Cardiovascular: Normal rate, regular rhythm, normal heart sounds. Exam reveals no gallop and no friction rub. No murmur heard.  Pulmonary/Chest: Effort normal and breath sounds normal. No stridor. No respiratory distress. He has no wheezes. He has no rales. He exhibits no tenderness.  Abdominal: Soft. Bowel sounds are normal. He exhibits no distension. There is no tenderness. There is no rebound and no guarding.  Musculoskeletal: Normal range of motion. He exhibits no edema and no tenderness.  Neurological: He is alert and oriented to person, place, and time. Coordination normal.  Skin: Skin is warm and dry. No rash noted. He is not  diaphoretic. No erythema. No pallor.  Psychiatric: He has a normal mood and affect. His behavior is normal. Judgment and thought content normal.       EKG: Sinus  Rhythm  -Old inferior-apical infarct  -IVCD   -  Nonspecific T-abnormality.   ABNORMAL   ASSESSMENT AND PLAN

## 2015-11-12 NOTE — Patient Instructions (Signed)
Medication Instructions: Continue same medications.   Labwork: None.   Procedures/Testing: None.   Follow-Up: 6 months with Dr. Towana Stenglein.   Any Additional Special Instructions Will Be Listed Below (If Applicable).     If you need a refill on your cardiac medications before your next appointment, please call your pharmacy.   

## 2015-11-12 NOTE — Assessment & Plan Note (Signed)
Lab Results  Component Value Date   CHOL 138 07/29/2015   HDL 29.80* 07/29/2015   LDLCALC 74 07/07/2014   LDLDIRECT 70.0 07/29/2015   TRIG 340.0* 07/29/2015   CHOLHDL 5 07/29/2015   His LDL was at target. His triglyceride was elevated likely related to uncontrolled diabetes.

## 2015-11-12 NOTE — Assessment & Plan Note (Signed)
He is doing well at the present time and has no symptoms suggestive of angina. Continue medical therapy.

## 2015-11-17 ENCOUNTER — Other Ambulatory Visit: Payer: 59

## 2015-11-24 ENCOUNTER — Other Ambulatory Visit: Payer: Self-pay | Admitting: Family Medicine

## 2015-11-24 ENCOUNTER — Other Ambulatory Visit (INDEPENDENT_AMBULATORY_CARE_PROVIDER_SITE_OTHER): Payer: 59

## 2015-11-24 DIAGNOSIS — E785 Hyperlipidemia, unspecified: Secondary | ICD-10-CM

## 2015-11-24 DIAGNOSIS — Z1159 Encounter for screening for other viral diseases: Secondary | ICD-10-CM | POA: Diagnosis not present

## 2015-11-24 DIAGNOSIS — E119 Type 2 diabetes mellitus without complications: Secondary | ICD-10-CM

## 2015-11-24 DIAGNOSIS — Z760 Encounter for issue of repeat prescription: Secondary | ICD-10-CM | POA: Diagnosis not present

## 2015-11-24 LAB — LIPID PANEL
Cholesterol: 124 mg/dL (ref 0–200)
HDL: 27.6 mg/dL — AB (ref >39.00)
NONHDL: 96.27
TRIGLYCERIDES: 220 mg/dL — AB (ref 0.0–149.0)
Total CHOL/HDL Ratio: 4
VLDL: 44 mg/dL — ABNORMAL HIGH (ref 0.0–40.0)

## 2015-11-24 LAB — COMPREHENSIVE METABOLIC PANEL
ALK PHOS: 29 U/L — AB (ref 39–117)
ALT: 22 U/L (ref 0–53)
AST: 24 U/L (ref 0–37)
Albumin: 4.3 g/dL (ref 3.5–5.2)
BILIRUBIN TOTAL: 0.4 mg/dL (ref 0.2–1.2)
BUN: 14 mg/dL (ref 6–23)
CALCIUM: 10 mg/dL (ref 8.4–10.5)
CO2: 30 mEq/L (ref 19–32)
CREATININE: 1.16 mg/dL (ref 0.40–1.50)
Chloride: 101 mEq/L (ref 96–112)
GFR: 66.18 mL/min (ref >60.00)
GLUCOSE: 166 mg/dL — AB (ref 70–99)
Potassium: 4.5 mEq/L (ref 3.5–5.1)
Sodium: 137 mEq/L (ref 135–145)
TOTAL PROTEIN: 7 g/dL (ref 6.0–8.3)

## 2015-11-24 LAB — HEMOGLOBIN A1C: HEMOGLOBIN A1C: 9.4 % — AB (ref 4.6–6.5)

## 2015-11-24 LAB — LDL CHOLESTEROL, DIRECT: Direct LDL: 71 mg/dL

## 2015-11-25 LAB — HEPATITIS C ANTIBODY: HCV AB: NEGATIVE

## 2015-11-26 ENCOUNTER — Encounter: Payer: Self-pay | Admitting: Family Medicine

## 2015-11-26 ENCOUNTER — Ambulatory Visit (INDEPENDENT_AMBULATORY_CARE_PROVIDER_SITE_OTHER): Payer: 59 | Admitting: Family Medicine

## 2015-11-26 VITALS — BP 100/70 | HR 70 | Temp 98.7°F | Ht 70.5 in | Wt 235.5 lb

## 2015-11-26 DIAGNOSIS — I10 Essential (primary) hypertension: Secondary | ICD-10-CM

## 2015-11-26 DIAGNOSIS — I2510 Atherosclerotic heart disease of native coronary artery without angina pectoris: Secondary | ICD-10-CM

## 2015-11-26 DIAGNOSIS — E119 Type 2 diabetes mellitus without complications: Secondary | ICD-10-CM

## 2015-11-26 DIAGNOSIS — R05 Cough: Secondary | ICD-10-CM | POA: Diagnosis not present

## 2015-11-26 DIAGNOSIS — R059 Cough, unspecified: Secondary | ICD-10-CM

## 2015-11-26 DIAGNOSIS — E785 Hyperlipidemia, unspecified: Secondary | ICD-10-CM | POA: Diagnosis not present

## 2015-11-26 LAB — HM DIABETES FOOT EXAM

## 2015-11-26 MED ORDER — METFORMIN HCL ER (MOD) 500 MG PO TB24: 1000.0000 mg | ORAL_TABLET | Freq: Every day | ORAL | Status: DC

## 2015-11-26 MED ORDER — AZITHROMYCIN 250 MG PO TABS: ORAL_TABLET | ORAL | Status: DC

## 2015-11-26 NOTE — Assessment & Plan Note (Signed)
Well controlled. Continue current medication.  

## 2015-11-26 NOTE — Progress Notes (Signed)
Pre visit review using our clinic review tool, if applicable. No additional management support is needed unless otherwise documented below in the visit note. 

## 2015-11-26 NOTE — Progress Notes (Signed)
70 year old male presents for 3 month follow up DM, chol and HTN  Cough, ongoing in last 2 weeks. Nasal congestion.  Started mucinex DM.Marland Kitchen Helps some but still with coughing fits.  White mucus and thicker. Gradually worse i last 2 days.  No fever.  No ear pain, no sinus pressure. No SOB, occ exp wheeze. Former smoker x 20 years.  Elevated Cholesterol: LDL at < 70 goal. Due to CAD, triglycerides not at [facility] crestor 10 mg daily, fenofibrate, fish oil   Lab Results  Component Value Date   CHOL 124 11/24/2015   HDL 27.60* 11/24/2015   LDLCALC 74 07/07/2014   LDLDIRECT 71.0 11/24/2015   TRIG 220.0* 11/24/2015   CHOLHDL 4 11/24/2015  Using medications without problems: None Muscle aches: None Diet compliance: poor Exercise: Had been off diet and exercise.Marland Kitchen Has started back now. Other complaints:  Diabetes: Worsened control, despite low dose metformin. Working on  [employer] and now exercise. No side effects. Lab Results  Component Value Date   HGBA1C 9.4* 11/24/2015   Using medications without difficulties: Hypoglycemic episodes:? Hyperglycemic episodes: occ, after christmas 300s Feet problems: None Blood Sugars averaging:FBS 128-160, 2 hours postprandially 170-200  was very high in 12 but no lower in last few weeks. eye exam within last year: in last year  Hypertension:  Well controlled on losartan and bystolic  BP Readings from Last 3 Encounters:  11/26/15 100/70  11/12/15 121/79  11/04/15 114/72  Using medication without problems or lightheadedness:  None Chest pain with exertion: None Edema: None Short of breath: Gets winded more quickly than prior to CAD Average home BPs: not checking Other issues:  Cardiology Dr. Fletcher Anon 06/08/2015 Last ECHO 07/2014 EF 55-60% Wt Readings from Last 3 Encounters:  11/26/15 235 lb 8 oz (106.822 kg)  11/12/15 240 lb 8 oz (109.09 kg)  11/04/15 237 lb 9.6 oz (107.775 kg)    Review of Systems  Constitutional:  Negative for fever and fatigue.  HENT: Negative for ear pain.  Eyes: Negative for itching.  Respiratory: Negative for cough.  Cardiovascular: Negative for chest pain and leg swelling.  Gastrointestinal: Negative for constipation.      Objective:  Physical Exam  Constitutional: Vital signs are normal. He appears well-developed and well-nourished.  HENT:  Head: Normocephalic.  Right Ear: Hearing normal.  Left Ear: Hearing normal.  Nose: Nose: turbinate swelling and congestion Mouth/Throat: Oropharynx is clear and moist and mucous membranes are normal.  Neck: Trachea normal. Carotid bruit is not present. No thyroid mass and no thyromegaly present.  Cardiovascular: Normal rate, regular rhythm and normal pulses. Exam reveals no gallop, no distant heart sounds and no friction rub.  No murmur heard. No peripheral edema  Pulmonary/Chest: Effort normal and breath sounds: scattered wheeze, no rales or rhonchi. No respiratory distress.  Skin: Skin is warm, dry and intact. No rash noted.  Psychiatric: He has a normal mood and affect. His speech is normal and behavior is normal. Thought content normal.   Diabetic foot exam: Normal inspection No skin breakdown No calluses  Normal DP pulses Decreased sensation to light touch and monofilament at toes bilaterally. Nails normal

## 2015-11-26 NOTE — Assessment & Plan Note (Signed)
>   2 weeks in former smoker, worsening.. Treat for bacterial boirnchtis

## 2015-11-26 NOTE — Assessment & Plan Note (Signed)
Inadequated control  .Marland Kitchen Increase metformin and continue work on lifestyle changes.

## 2015-11-26 NOTE — Patient Instructions (Addendum)
Keep up the lifestyle changes with portion control, wieght loss and eating changes. Keep up regular exercise.  Increase Metformin to 1000 mg daily.  Complete antibiotics. Mucinex DM twice daily. Start nasal saline spray 2-3 times a day.

## 2015-12-01 ENCOUNTER — Other Ambulatory Visit: Payer: Self-pay

## 2015-12-01 VITALS — BP 114/66 | HR 52 | Resp 16 | Ht 70.5 in | Wt 238.4 lb

## 2015-12-01 DIAGNOSIS — E119 Type 2 diabetes mellitus without complications: Secondary | ICD-10-CM

## 2015-12-01 NOTE — Patient Outreach (Signed)
[facility]) Care Management   12/01/2015  Tommy Sullivan 1946-04-09 [id number]  Tommy Sullivan is an 70 y.o. male.   Member seen for follow up office visit for Link to Wellness program for self management of Type 2 diabetes  Subjective: Member states that he saw Dr.Bedsole a few weeks ago and his hemoglobin A1C has gone up to 9.4.  States that she increased his Metformin to twice a day but he has not started the new  Dose yet.  States he is going to pick up his new Rx today.  States that he had been sick with bronchitis and he has taken antibiotics.  States he is feeling better now.  States he has started eating better and exercising again.    Objective:   Review of Systems  All other systems reviewed and are negative.   Physical Exam  Today's Vitals   12/01/15 1013  BP: 114/66  Pulse: 52  Resp: 16  Height: 1.791 m (5' 10.5")  Weight: 238 lb 6.4 oz (108.138 kg)  SpO2: 98%  PainSc: 0-No pain    Current Medications:   Current Outpatient Prescriptions  Medication Sig Dispense Refill  . aspirin EC 81 MG tablet Take 1 tablet (81 mg total) by mouth daily. 90 tablet 3  . Blood Glucose Monitoring Suppl (BAYER CONTOUR NEXT MONITOR) W/DEVICE KIT 1 each by Does not apply route. Use to check blood sugar 2 times daily.  Dx: E11.9    . BYSTOLIC 10 MG tablet TAKE 1 TABLET BY MOUTH DAILY. 90 tablet 1  . Cholecalciferol (VITAMIN D3) 1000 units CAPS Take 1 capsule by mouth daily.    . CRESTOR 10 MG tablet TAKE 1 TABLET BY MOUTH DAILY. 90 tablet 3  . dextromethorphan-guaiFENesin (MUCINEX DM) 30-600 MG 12hr tablet Take 1 tablet by mouth 2 (two) times daily.    . fenofibrate 160 MG tablet TAKE 1 TABLET BY MOUTH DAILY. 90 tablet 1  . glucose blood (BAYER CONTOUR NEXT TEST) test strip Use to check blood sugar two times daily.  Dx: E11.9 100 each 12  . losartan (COZAAR) 100 MG tablet TAKE ONE TABLET BY MOUTH DAILY 90 tablet 1  . metFORMIN (GLUMETZA) 500 MG (MOD) 24 hr tablet  Take 2 tablets (1,000 mg total) by mouth daily with breakfast. 180 tablet 3  . modafinil (PROVIGIL) 200 MG tablet Take 1 tablet (200 mg total) by mouth daily. Once daily 90 tablet 3  . Omega-3 Fatty Acids (FISH OIL) 1000 MG CAPS Take 2 capsules by mouth 2 (two) times daily.     . tamsulosin (FLOMAX) 0.4 MG CAPS capsule TAKE 1 CAPSULE BY MOUTH DAILY. 90 capsule 1  . timolol (BETIMOL) 0.5 % ophthalmic solution Place 1 drop into the left eye daily.    Marland Kitchen azithromycin (ZITHROMAX) 250 MG tablet 2 tab po x 1 day then 1 tab po daily (Patient not taking: Reported on 12/01/2015) 6 tablet 0   No current facility-administered medications for this visit.    Functional Status:   In your present state of health, do you have any difficulty performing the following activities: 12/01/2015 09/01/2015  Hearing? N N  Vision? N N  Difficulty concentrating or making decisions? N N  Walking or climbing stairs? N -  Dressing or bathing? N N  Doing errands, shopping? N N    Fall/Depression Screening:    PHQ 2/9 Scores 12/01/2015 09/01/2015 05/19/2015 01/20/2015 12/22/2014 04/13/2014 12/09/2013  PHQ - 2 Score 0 0 0 0  0 0 0    Assessment:   Member seen for follow up office visit for Link to Wellness program for self management of Type 2 diabetes.  Member not meeting diabetes self management goal of hemoglobin A1C of 7 or less with last reading 9.4. Member has not been adherent with diet or exercise for the last few months but reports making improvements in the last few weeks. Reports blood sugars ranging from 140-180 fasting and 160-200 post prandial. Member to increase  Metformin to twice a day but has not started new dose yet. and is to see MD on 02/25/16.  Plan:  Plan to limit carbohydrates.  Plan to eat protein with each meal.  Try to eat 45-60 Gm of carbohydrate each meal and 15Gm for snacks.  Plan to eat protein with snacks Plan to check blood sugar 2 times a day - fasting or 1 -2hrs after a meal.  Goals of 80-130  fasting or less than 180 after meals Plan to exercise 3-4 times a week for 30 minutes on elliptical machine. Plan to do strength training 4 times a week.  Try to work up to 150 minutes a week  Plan to keep appointment with Dr. Diona Browner on 02/25/16 Plan to complete EMMI programs by 01/31/16 Plan to return to Link to Wellness on  03/01/16 at 10 AM at [place] Problem One        Most Recent Value   Care Plan Problem One  Elevated blood sugars related to dx of Type 2 DM as evidenced by hemoglobin A1C of 7.8   Role Documenting the Problem One  Care Management [place] for Problem One  Active   [facility] Long Term Goal (31-90 days)  Member will decrease his hemoglobin A1C to 7.0 or below in the next 90 days   [facility] Long Term Goal Start Date  12/01/15 [facility] not meeting goal with last hemoglobin A1C 9.4]   Interventions for Problem One Long Term Goal   Reviewed CHO counting and portion control, Reinforced  to have protein with each meal and snacks, Reinforced on the importance of  regular exercise to glycemic control, Instructed to watch for  possible side effects of metformin when he increases the dose today, Discussed progressive nature of diabetes and that he might see  his need for medication to control his diabetes to increase, Reinforced lifestyle changes that can help keep his use of medications lower    Peter Garter RN, [facility] Care Management Coordinator-Link to [place] Management [phone]

## 2015-12-02 NOTE — Patient Instructions (Signed)
1. Plan to limit carbohydrates.  Plan to eat protein with each meal.  Try to eat 45-60 Gm of carbohydrate each meal and 15Gm for snacks.  Plan to eat protein with snacks 2. Plan to check blood sugar 2 times a day - fasting or 1 -2hrs after a meal.  Goals of 80-130 fasting or less than 180 after meals 3. Plan to exercise 3-4 times a week for 30 minutes on elliptical machine. Plan to do strength training 4 times a week.  Try to work up to 150 minutes a week  4. Plan to keep appointment with Dr. Diona Browner on 02/25/16 5. Plan to complete EMMI programs by 01/31/16 6. Plan to return to Link to Wellness on  03/01/16 at 10 AM at [facility]

## 2015-12-08 DIAGNOSIS — Z760 Encounter for issue of repeat prescription: Secondary | ICD-10-CM | POA: Diagnosis not present

## 2015-12-24 ENCOUNTER — Ambulatory Visit (INDEPENDENT_AMBULATORY_CARE_PROVIDER_SITE_OTHER): Payer: 59 | Admitting: Internal Medicine

## 2015-12-24 ENCOUNTER — Encounter: Payer: Self-pay | Admitting: Internal Medicine

## 2015-12-24 ENCOUNTER — Ambulatory Visit (INDEPENDENT_AMBULATORY_CARE_PROVIDER_SITE_OTHER)
Admission: RE | Admit: 2015-12-24 | Discharge: 2015-12-24 | Disposition: A | Discharge status: Home / Self Care | Payer: 59 | Source: Ambulatory Visit | Attending: Internal Medicine | Admitting: Internal Medicine

## 2015-12-24 VITALS — BP 108/70 | HR 94 | Temp 97.7°F | Resp 18 | Wt 240.0 lb

## 2015-12-24 DIAGNOSIS — R05 Cough: Secondary | ICD-10-CM

## 2015-12-24 DIAGNOSIS — R059 Cough, unspecified: Secondary | ICD-10-CM

## 2015-12-24 DIAGNOSIS — Z760 Encounter for issue of repeat prescription: Secondary | ICD-10-CM | POA: Diagnosis not present

## 2015-12-24 DIAGNOSIS — I2510 Atherosclerotic heart disease of native coronary artery without angina pectoris: Secondary | ICD-10-CM

## 2015-12-24 MED ORDER — PREDNISONE 20 MG PO TABS: 40.0000 mg | ORAL_TABLET | Freq: Every day | ORAL | Status: DC

## 2015-12-24 MED ORDER — ALBUTEROL SULFATE HFA 108 (90 BASE) MCG/ACT IN AERS: 2.0000 | INHALATION_SPRAY | Freq: Four times a day (QID) | RESPIRATORY_TRACT | Status: DC | PRN

## 2015-12-24 NOTE — Progress Notes (Signed)
Subjective:    Patient ID: Tommy Sullivan, male    DOB: Dec 06, 1945, 70 y.o.   MRN: [id number]  HPI He is here due to cough  Seen 1 month ago and had cough then--got zpak Did seem to resolve But congestion persisted and cough coming back again Now hoarse again Had attack last night of cough paroxysm Has pressure in right ear Really felt sick this AM--had to go back to sleep Lots of nasal secretions every morning--white.  White sputum Doesn't feel PND No headache No fever No chills or sweats Some SOB--not new (discussed with cardiologist) Some sore throat  Unable to wear his CPAP due to congestion Worried he may be getting infection from tubing which keeps heated water in it  No oral meds for this  Current Outpatient Prescriptions on File Prior to Visit  Medication Sig Dispense Refill  . aspirin EC 81 MG tablet Take 1 tablet (81 mg total) by mouth daily. 90 tablet 3  . Blood Glucose Monitoring Suppl (BAYER CONTOUR NEXT MONITOR) W/DEVICE KIT 1 each by Does not apply route. Use to check blood sugar 2 times daily.  Dx: T05.6    . BYSTOLIC 10 MG tablet TAKE 1 TABLET BY MOUTH DAILY. 90 tablet 1  . Cholecalciferol (VITAMIN D3) 1000 units CAPS Take 1 capsule by mouth daily.    . CRESTOR 10 MG tablet TAKE 1 TABLET BY MOUTH DAILY. 90 tablet 3  . dextromethorphan-guaiFENesin (MUCINEX DM) 30-600 MG 12hr tablet Take 1 tablet by mouth 2 (two) times daily.    . fenofibrate 160 MG tablet TAKE 1 TABLET BY MOUTH DAILY. 90 tablet 1  . glucose blood (BAYER CONTOUR NEXT TEST) test strip Use to check blood sugar two times daily.  Dx: E11.9 100 each 12  . losartan (COZAAR) 100 MG tablet TAKE ONE TABLET BY MOUTH DAILY 90 tablet 1  . metFORMIN (GLUMETZA) 500 MG (MOD) 24 hr tablet Take 2 tablets (1,000 mg total) by mouth daily with breakfast. 180 tablet 3  . modafinil (PROVIGIL) 200 MG tablet Take 1 tablet (200 mg total) by mouth daily. Once daily 90 tablet 3  . Omega-3 Fatty Acids (FISH OIL) 1000  MG CAPS Take 2 capsules by mouth 2 (two) times daily.     . tamsulosin (FLOMAX) 0.4 MG CAPS capsule TAKE 1 CAPSULE BY MOUTH DAILY. 90 capsule 1  . timolol (BETIMOL) 0.5 % ophthalmic solution Place 1 drop into the left eye daily.     No current facility-administered medications on file prior to visit.    No Known Allergies  Past Medical History  Diagnosis Date  . Hyperlipidemia   . Hypertension   . Allergy   . Glaucoma   . Retinal vascular occlusion, unspecified     Right  . Extrinsic asthma, unspecified   . Coronary atherosclerosis of unspecified type of vessel, native or graft   . Gout   . Diabetes mellitus without complication ([place])   . Arthritis   . Myocardial infarction [facility]) 1/09    PCI done by Dr. Collene Mares in [place]  . Atrial flutter [facility])     Past Surgical History  Procedure Laterality Date  . Tonsillectomy    . Appendectomy  1954  . Neck surgery  1990's    x2, Fusion  . Colonoscopy    . Coronary angioplasty  2009    stent [place]  . Shoulder arthroscopy with subacromial decompression Left 04/02/2014    Procedure: SHOULDER ARTHROSCOPY WITH SUBACROMIAL DECOMPRESSION WITH  ROTATOR CUFF REPAIR;  Surgeon: Nita Sells, MD;  Location: [place];  Service: Orthopedics;  Laterality: Left;  Left shoulder rotator cuff repair, subacromial decompression  . Ablation  05-07-2014    EPS and ablation of atrial flutter by Dr Caryl Comes  . Atrial flutter ablation N/A 05/07/2014    Procedure: ATRIAL FLUTTER ABLATION;  Surgeon: Deboraha Sprang, MD;  Location: [facility] CATH LAB;  Service: Cardiovascular;  Laterality: N/A;  . Skin surgery      Family History  Problem Relation Age of Onset  . Diabetes Sister   . Coronary artery disease Brother   . AAA (abdominal aortic aneurysm) Father   . Heart disease Mother   . Diabetes Mother   . Diabetes Brother   . Colon cancer Neg Hx     Social History   Social History  . Marital Status: Married    Spouse Name: N/A    . Number of Children: N/A  . Years of Education: N/A   Occupational History  . retired    Social History Main Topics  . Smoking status: Former Smoker -- 1.00 packs/day for 20 years    Types: Cigarettes    Quit date: 10/02/1988  . Smokeless tobacco: Never Used     Comment: quit in the 1990's  . Alcohol Use: 1.8 oz/week    3 Cans of beer per week     Comment: 3-4 drinks per week.   . Drug Use: No  . Sexual Activity: Not on file   Other Topics Concern  . Not on file   Social History Narrative   Review of Systems  No rash No vomiting or diarrhea Appetite is fine     Objective:   Physical Exam  Constitutional: He appears well-developed and well-nourished. No distress.  HENT:  Mouth/Throat: Oropharynx is clear and moist. No oropharyngeal exudate.  No sinus tenderness TMs normal Moderate nasal inflammation  Neck: Normal range of motion. Neck supple. No thyromegaly present.  Pulmonary/Chest: Effort normal. No respiratory distress. He has no rales.  Fair air movement but mild exp phase prolongation and exp wheezing  Lymphadenopathy:    He has no cervical adenopathy.          Assessment & Plan:

## 2015-12-24 NOTE — Assessment & Plan Note (Addendum)
And some SOB over the past 2 months Will check CXR   CXR normal---seems to have mild persistent asthma symptoms Will give 5 days of prednisone Albuterol inhaler May need controller med if ongoing symptoms

## 2015-12-24 NOTE — Progress Notes (Signed)
Pre visit review using our clinic review tool, if applicable. No additional management support is needed unless otherwise documented below in the visit note. 

## 2015-12-29 DIAGNOSIS — H40002 Preglaucoma, unspecified, left eye: Secondary | ICD-10-CM | POA: Diagnosis not present

## 2015-12-29 LAB — HM DIABETES EYE EXAM

## 2015-12-31 ENCOUNTER — Encounter: Payer: Self-pay | Admitting: Family Medicine

## 2016-01-08 DIAGNOSIS — Z760 Encounter for issue of repeat prescription: Secondary | ICD-10-CM | POA: Diagnosis not present

## 2016-01-24 DIAGNOSIS — Z760 Encounter for issue of repeat prescription: Secondary | ICD-10-CM | POA: Diagnosis not present

## 2016-01-26 ENCOUNTER — Other Ambulatory Visit: Payer: Self-pay | Admitting: Family Medicine

## 2016-02-07 DIAGNOSIS — Z760 Encounter for issue of repeat prescription: Secondary | ICD-10-CM | POA: Diagnosis not present

## 2016-02-22 ENCOUNTER — Telehealth: Payer: Self-pay | Admitting: Family Medicine

## 2016-02-22 DIAGNOSIS — E119 Type 2 diabetes mellitus without complications: Secondary | ICD-10-CM

## 2016-02-22 DIAGNOSIS — M1A9XX0 Chronic gout, unspecified, without tophus (tophi): Secondary | ICD-10-CM

## 2016-02-22 DIAGNOSIS — E785 Hyperlipidemia, unspecified: Secondary | ICD-10-CM

## 2016-02-22 NOTE — Telephone Encounter (Signed)
-----   Message from Ellamae Sia sent at 02/14/2016  3:51 PM EDT ----- Regarding: Lab orders for Thursday, 5.25.17 Lab orders for a 3 month follow up appt.

## 2016-02-24 ENCOUNTER — Other Ambulatory Visit (INDEPENDENT_AMBULATORY_CARE_PROVIDER_SITE_OTHER): Payer: 59

## 2016-02-24 DIAGNOSIS — E785 Hyperlipidemia, unspecified: Secondary | ICD-10-CM | POA: Diagnosis not present

## 2016-02-24 DIAGNOSIS — E119 Type 2 diabetes mellitus without complications: Secondary | ICD-10-CM

## 2016-02-24 LAB — COMPREHENSIVE METABOLIC PANEL
ALT: 23 U/L (ref 0–53)
AST: 20 U/L (ref 0–37)
Albumin: 4.1 g/dL (ref 3.5–5.2)
Alkaline Phosphatase: 22 U/L — ABNORMAL LOW (ref 39–117)
BILIRUBIN TOTAL: 0.7 mg/dL (ref 0.2–1.2)
BUN: 17 mg/dL (ref 6–23)
CHLORIDE: 104 meq/L (ref 96–112)
CO2: 30 meq/L (ref 19–32)
CREATININE: 1.16 mg/dL (ref 0.40–1.50)
Calcium: 9.8 mg/dL (ref 8.4–10.5)
GFR: 66.13 mL/min (ref >60.00)
GLUCOSE: 177 mg/dL — AB (ref 70–99)
Potassium: 4 mEq/L (ref 3.5–5.1)
Sodium: 138 mEq/L (ref 135–145)
Total Protein: 6.5 g/dL (ref 6.0–8.3)

## 2016-02-24 LAB — LIPID PANEL
CHOL/HDL RATIO: 4
Cholesterol: 108 mg/dL (ref 0–200)
HDL: 24.7 mg/dL — AB (ref >39.00)
LDL CALC: 49 mg/dL (ref 0–99)
NONHDL: 83.18
TRIGLYCERIDES: 172 mg/dL — AB (ref 0.0–149.0)
VLDL: 34.4 mg/dL (ref 0.0–40.0)

## 2016-02-24 LAB — HEMOGLOBIN A1C: Hgb A1c MFr Bld: 9.6 % — ABNORMAL HIGH (ref 4.6–6.5)

## 2016-02-25 ENCOUNTER — Encounter: Payer: Self-pay | Admitting: Family Medicine

## 2016-02-25 ENCOUNTER — Telehealth: Payer: Self-pay

## 2016-02-25 ENCOUNTER — Ambulatory Visit (INDEPENDENT_AMBULATORY_CARE_PROVIDER_SITE_OTHER): Payer: 59 | Admitting: Family Medicine

## 2016-02-25 VITALS — BP 124/76 | HR 65 | Temp 97.6°F | Wt 239.5 lb

## 2016-02-25 DIAGNOSIS — I10 Essential (primary) hypertension: Secondary | ICD-10-CM | POA: Diagnosis not present

## 2016-02-25 DIAGNOSIS — J449 Chronic obstructive pulmonary disease, unspecified: Secondary | ICD-10-CM | POA: Insufficient documentation

## 2016-02-25 DIAGNOSIS — E785 Hyperlipidemia, unspecified: Secondary | ICD-10-CM

## 2016-02-25 DIAGNOSIS — R0600 Dyspnea, unspecified: Secondary | ICD-10-CM

## 2016-02-25 DIAGNOSIS — I2510 Atherosclerotic heart disease of native coronary artery without angina pectoris: Secondary | ICD-10-CM

## 2016-02-25 DIAGNOSIS — E119 Type 2 diabetes mellitus without complications: Secondary | ICD-10-CM | POA: Diagnosis not present

## 2016-02-25 LAB — HM DIABETES FOOT EXAM

## 2016-02-25 NOTE — Telephone Encounter (Signed)
Pt notified as instructed and pt voiced understanding. 

## 2016-02-25 NOTE — Assessment & Plan Note (Signed)
Inadeqaute control, minimal response to  Metformin.. Not clearly SOB SE from med.  Increase metformin  By 500 mg weekly till at max or goal FBS < 120. Encouraged exercise, weight loss, healthy eating habits.

## 2016-02-25 NOTE — Progress Notes (Signed)
70 year old male presents for follow up 3 month.  Elevated Cholesterol: LDL at < 70 goal. Due to CAD, triglycerides not at [facility] crestor 10 mg daily, fenofibrate, fish oil   Lab Results  Component Value Date   CHOL 108 02/24/2016   HDL 24.70* 02/24/2016   LDLCALC 49 02/24/2016   LDLDIRECT 71.0 11/24/2015   TRIG 172.0* 02/24/2016   CHOLHDL 4 02/24/2016  Using medications without problems: None Muscle aches: None Diet compliance: poor Exercise: Has been very active lately. Other complaints:  Diabetes: Worsened control, despite increasen in metformin to 100 mg daily. Working on [employer] and now exercise. Increase in shortness of breath dry mouth, on higher dose of metformin. No cough, no congestion, no wheeze. Good energy overall. Lab Results  Component Value Date   HGBA1C 9.6* 02/24/2016  Using medications without difficulties: Hypoglycemic episodes:? Hyperglycemic episodes: occ, after christmas 300s Feet problems: None Blood Sugars averaging:FBS 200 eye exam within last year: in last year  Hypertension:  Well controlled on losartan and bystolic  BP Readings from Last 3 Encounters:  02/25/16 124/76  12/24/15 108/70  12/01/15 114/66  Using medication without problems or lightheadedness:  None Chest pain with exertion: None Edema: None Short of breath: Gets winded more quickly than prior to CAD Average home BPs: not checking Other issues:  Wt Readings from Last 3 Encounters:  02/25/16 239 lb 8 oz (108.636 kg)  12/24/15 240 lb (108.863 kg)  12/01/15 238 lb 6.4 oz (108.138 kg)   Due for follow up with cardiology.  Social History /Family History/Past Medical History reviewed and updated if needed.  Review of Systems  Constitutional: Negative for fever and fatigue.  HENT: Negative for ear pain.  Eyes: Negative for itching.  Respiratory: Negative for cough.  Cardiovascular: Negative for chest pain and leg swelling.  Gastrointestinal:  Negative for constipation.      Objective:  Physical Exam  Constitutional: Vital signs are normal. He appears well-developed and well-nourished.  HENT:  Head: Normocephalic.  Right Ear: Hearing normal.  Left Ear: Hearing normal.  Nose: Nose: turbinate swelling and congestion Mouth/Throat: Oropharynx is clear and moist and mucous membranes are normal.  Neck: Trachea normal. Carotid bruit is not present. No thyroid mass and no thyromegaly present.  Cardiovascular: Normal rate, regular rhythm and normal pulses. Exam reveals no gallop, no distant heart sounds and no friction rub.  No murmur heard. No peripheral edema  Pulmonary/Chest: Effort normal and breath sounds: scattered wheeze, no rales or rhonchi. No respiratory distress.  Skin: Skin is warm, dry and intact. No rash noted.  Psychiatric: He has a normal mood and affect. His speech is normal and behavior is normal. Thought content normal.   Diabetic foot exam: Normal inspection No skin breakdown No calluses  Normal DP pulses Decreased sensation to light touch and monofilament at toes bilaterally. Nails normal

## 2016-02-25 NOTE — Patient Instructions (Addendum)
Increase metformin to 2 tabs daily of 500 mg daily. If fasting blood sugar is not < 120 in 1-2 weeks. Then increase metformin to 4 tabs daily.  If shortness of breath worsening , stop and call.  Make appointment with cardiology for re-eval of dyspnea. If not felt to be cardiac in source, make appointment to followup dyspnea with labs and lung eval.

## 2016-02-25 NOTE — Assessment & Plan Note (Signed)
LDL at goal on crestor and trig almost at goal on fenofibrate. Encouraged exercise, weight loss, healthy eating habits.

## 2016-02-25 NOTE — Telephone Encounter (Signed)
Yes.. Pt to take 3 tabs daily x 1 week then if FBS not < 120, increase to 4 tabs daily. Please  Ask him to excuse my mistyping.

## 2016-02-25 NOTE — Assessment & Plan Note (Signed)
Well controlled. Continue current medication.  

## 2016-02-25 NOTE — Telephone Encounter (Signed)
Pt left v/m;pt was seen earlier today and pt wants to clarify instructions from todays AVS; pt said AVS has increase metformin to 2 tabs daily of 500 mg daily. Pt is already taking 2 tabs daily. Pt wants to know if should increase to 3 tabs daily or what to do? Pt request cb.

## 2016-02-25 NOTE — Assessment & Plan Note (Signed)
No sign of fluid overload.  No sign of pulmonary issues.  Good energy goes against thyroid or anemia issues.  See cardiology for further eval.  ? Med SE, cardiac meds versus not clearly due to metformin but on med SE list 1-5% frequency. If neg cardiac eval. Consider cbc, tsh, lung eval.

## 2016-02-25 NOTE — Progress Notes (Signed)
Pre visit review using our clinic review tool, if applicable. No additional management support is needed unless otherwise documented below in the visit note. 

## 2016-03-01 ENCOUNTER — Ambulatory Visit: Payer: Self-pay

## 2016-03-15 ENCOUNTER — Other Ambulatory Visit: Payer: Self-pay

## 2016-03-15 VITALS — BP 122/72 | HR 66 | Resp 16 | Ht 70.5 in | Wt 243.2 lb

## 2016-03-15 DIAGNOSIS — E119 Type 2 diabetes mellitus without complications: Secondary | ICD-10-CM

## 2016-03-15 NOTE — Patient Outreach (Signed)
[place] [facility]) Care Management   03/15/2016  MASSAI HANKERSON 1945/12/12 [id number]  Tommy Sullivan is an 70 y.o. male.   Member seen for follow up office visit for [facility] program for self management of Type 2 diabetes  Subjective: Member states that his hemoglobin A1C has gone up to 9.6 when he saw Dr.Bedsole.  States he has been increasing his metformin and he is now taking 4 tablets a day.  States that his morning blood sugars are starting to get better and it was 140 this morning.  States they are ranging 150-160 but he just started taking 4 Metformin a few days ago.  States he continues to have some SOB with exertion when walking up hills and working on his RV.  States he discussed with his MD and he is to follow up with cardiology to see what might be the cause.  States he sometimes has has some dizziness when getting up and working on his RV.  States he is following a low CHO diet and exercising regularly.  Objective:   Review of Systems  Respiratory: Positive for shortness of breath.     Physical Exam Today's Vitals   03/15/16 1104  BP: 122/72  Pulse: 66  Resp: 16  Height: 1.791 m (5' 10.5")  Weight: 243 lb 3.2 oz (110.315 kg)  SpO2: 98%  PainSc: 0-No pain   Encounter Medications:   Outpatient Encounter Prescriptions as of 03/15/2016  Medication Sig  . aspirin EC 81 MG tablet Take 1 tablet (81 mg total) by mouth daily.  . Blood Glucose Monitoring Suppl (BAYER CONTOUR NEXT MONITOR) W/DEVICE KIT 1 each by Does not apply route. Use to check blood sugar 2 times daily.  Dx: O75.6  . BYSTOLIC 10 MG tablet TAKE 1 TABLET BY MOUTH DAILY.  Marland Kitchen Cholecalciferol (VITAMIN D3) 1000 units CAPS Take 1 capsule by mouth daily.  . CRESTOR 10 MG tablet TAKE 1 TABLET BY MOUTH DAILY.  . fenofibrate 160 MG tablet TAKE 1 TABLET BY MOUTH DAILY.  Marland Kitchen glucose blood (BAYER CONTOUR NEXT TEST) test strip Use to check blood sugar two times daily.  Dx: E11.9  . losartan (COZAAR)  100 MG tablet TAKE ONE TABLET BY MOUTH DAILY  . metFORMIN (GLUMETZA) 500 MG (MOD) 24 hr tablet Take 2 tablets (1,000 mg total) by mouth daily with breakfast. (Patient taking differently: Take 2,000 mg by mouth daily with breakfast. )  . modafinil (PROVIGIL) 200 MG tablet Take 1 tablet (200 mg total) by mouth daily. Once daily  . Omega-3 Fatty Acids (FISH OIL) 1000 MG CAPS Take 2 capsules by mouth 2 (two) times daily.   . tamsulosin (FLOMAX) 0.4 MG CAPS capsule TAKE 1 CAPSULE BY MOUTH DAILY.  Marland Kitchen timolol (BETIMOL) 0.5 % ophthalmic solution Place 1 drop into the left eye daily.   No facility-administered encounter medications on file as of 03/15/2016.    Functional Status:   In your present state of health, do you have any difficulty performing the following activities: 03/15/2016 12/01/2015  Hearing? N N  Vision? N N  Difficulty concentrating or making decisions? N N  Walking or climbing stairs? N N  Dressing or bathing? N N  Doing errands, shopping? N N    Fall/Depression Screening:    PHQ 2/9 Scores 03/15/2016 12/01/2015 09/01/2015 05/19/2015 01/20/2015 12/22/2014 04/13/2014  PHQ - 2 Score 0 0 0 0 0 0 0    Assessment:  Member seen for follow up office visit for Link  to Wellness program for self management of Type 2 diabetes. Member not meeting diabetes self management goal of hemoglobin A1C of 7% or less with last reading 9.6%. Member reports better  adherent with diet or exercise.  Reports blood sugars ranging from 140-180 fasting and 160-200 post prandial. Member has increased Metformin to 2000 mg daily. Member reports SOB with exertion at times and occasional dizziness which he has reported to MD and plans to follow up with cardiology.  Member had not been monitoring B/P at home.  Plan:  Plan to limit carbohydrates.  Plan to eat protein with each meal.  Try to eat 45-60 Gm of carbohydrate each meal and 15Gm for snacks.  Plan to eat protein with snacks Plan to check blood sugar 3-4 times a  day - fasting or 1 -2hrs after a meal.  Goals of 80-120 fasting or less than 180 after meals Plan to exercise 3-4 times a week for 30-60 minutes on kayak. Plan to do strength training 4 times a week.  Try to work up to 150 minutes a week  Plan to contact cardiology for follow up appointment Plan to pick up blood pressure monitor at pharmacy Plan to return to [place] [facility] Wellness on  06/14/16 at 10:45 AM at [place] Problem One        Most Recent Value   Care Plan Problem One  Elevated blood sugars related to dx of Type 2 DM as evidenced by hemoglobin A1C of 7.8   Role Documenting the Problem One  Care Management [place] for Problem One  Active   [facility] Long Term Goal (31-90 days)  Member will decrease his hemoglobin A1C to 7.0 or below in the next 90 days   [facility] Long Term Goal Start Date  03/15/16 [Continue last hemoglobin A1C 9.6%]   Interventions for Problem One Long Term Goal   Reviewed CHO counting and portion control, Reinforced  to have protein with each meal and snacks, Given voucher for blood pressure monitor and instructed to monitor B/P at home to get a base line and then take when he is dizzy and or SOB, Reinforced to contact his cardiologist to schedule a follow up,  Reinforced on the importance of  regular exercise to glycemic control, Reinforced progressive nature of diabetes and that he might see  his need for medication to control his diabetes to increase, Reinforced lifestyle changes that can help keep his use of medications lower    Peter Garter RN, [facility] Care Management Coordinator-Link to [place] Management [phone]

## 2016-03-15 NOTE — Patient Instructions (Signed)
1. Plan to limit carbohydrates.  Plan to eat protein with each meal.  Try to eat 45-60 Gm of carbohydrate each meal and 15Gm for snacks.  Plan to eat protein with snacks 2. Plan to check blood sugar 3-4 times a day - fasting or 1 -2hrs after a meal.  Goals of 80-120 fasting or less than 180 after meals 3. Plan to exercise 3-4 times a week for 30-60 minutes on kayak. Plan to do strength training 4 times a week.  Try to work up to 150 minutes a week  4. Plan to contact cardiology for follow up appointment 5. Plan to pick up blood pressure monitor at pharmacy 6. Plan to return to [facility] on  06/14/16 at 10:45 AM at [facility]

## 2016-04-21 ENCOUNTER — Other Ambulatory Visit: Payer: Self-pay | Admitting: Family Medicine

## 2016-04-21 ENCOUNTER — Telehealth: Payer: Self-pay | Admitting: *Deleted

## 2016-04-21 NOTE — Telephone Encounter (Signed)
-----   Message from Jinny Sanders, MD sent at 04/21/2016  8:19 AM EDT ----- Please contact pt to determine what he needs. If CBGs high.. Let me know. Probably okay to increase back up.  Amy  ----- Message -----    From: Guinevere Ferrari    Sent: 04/20/2016   2:07 PM      To: Jinny Sanders, MD  Hi Dr. Diona Browner,  Patient asked me to have you or a nurse contact him to discuss Rx quantities, symptoms went from bad to good since he cut out down his quantity, but needs more Metformin, wants to know if needs to make anymore changes to prescriptions.  Has enough meds to last him the rest of the week but needs more soon. He scheduled a CPE for Aug 8th, fyi.   Thanks! Maegen

## 2016-04-21 NOTE — Telephone Encounter (Signed)
Spoke with Tommy Sullivan.  He states he gradually increased his Metformin as instructed up to 4 tablets a day.  Then he became very lethargic, dizzy and states he just "felt like an old man".  Since then he has decreased his Metformin back down to one tablet daily and all those symptoms have resided.  He states his blood sugars are running good on the one tablet daily.  I recommended that we move up his CPE to next week with Dr. Diona Browner so they can look at his labs and make medication adjustments if needed.  Appointment rescheduled to 04/27/2016 at 11:45 am.  Tommy Sullivan states he has enough medication to get him to that appointment.

## 2016-04-24 NOTE — Telephone Encounter (Signed)
Agreed -

## 2016-04-25 ENCOUNTER — Telehealth: Payer: Self-pay | Admitting: Family Medicine

## 2016-04-25 DIAGNOSIS — E119 Type 2 diabetes mellitus without complications: Secondary | ICD-10-CM

## 2016-04-25 NOTE — Telephone Encounter (Signed)
-----   Message from Ellamae Sia sent at 04/24/2016  2:45 PM EDT ----- Regarding: Lab orders for Wednesday, 7.26.17 Patient is scheduled for CPX labs, please order future labs, Thanks , Karna Christmas

## 2016-04-26 ENCOUNTER — Other Ambulatory Visit (INDEPENDENT_AMBULATORY_CARE_PROVIDER_SITE_OTHER): Payer: 59

## 2016-04-26 DIAGNOSIS — E119 Type 2 diabetes mellitus without complications: Secondary | ICD-10-CM

## 2016-04-26 LAB — HEMOGLOBIN A1C: Hgb A1c MFr Bld: 7.9 % — ABNORMAL HIGH (ref 4.6–6.5)

## 2016-04-27 ENCOUNTER — Ambulatory Visit (INDEPENDENT_AMBULATORY_CARE_PROVIDER_SITE_OTHER): Payer: 59 | Admitting: Family Medicine

## 2016-04-27 ENCOUNTER — Encounter: Payer: Self-pay | Admitting: Family Medicine

## 2016-04-27 VITALS — BP 104/62 | HR 67 | Temp 98.7°F | Ht 69.5 in | Wt 232.8 lb

## 2016-04-27 DIAGNOSIS — E119 Type 2 diabetes mellitus without complications: Secondary | ICD-10-CM

## 2016-04-27 DIAGNOSIS — M1A9XX0 Chronic gout, unspecified, without tophus (tophi): Secondary | ICD-10-CM

## 2016-04-27 DIAGNOSIS — E785 Hyperlipidemia, unspecified: Secondary | ICD-10-CM | POA: Diagnosis not present

## 2016-04-27 DIAGNOSIS — E1340 Other specified diabetes mellitus with diabetic neuropathy, unspecified: Secondary | ICD-10-CM

## 2016-04-27 DIAGNOSIS — I483 Typical atrial flutter: Secondary | ICD-10-CM

## 2016-04-27 DIAGNOSIS — L989 Disorder of the skin and subcutaneous tissue, unspecified: Secondary | ICD-10-CM | POA: Insufficient documentation

## 2016-04-27 DIAGNOSIS — Z23 Encounter for immunization: Secondary | ICD-10-CM

## 2016-04-27 DIAGNOSIS — I10 Essential (primary) hypertension: Secondary | ICD-10-CM

## 2016-04-27 DIAGNOSIS — Z0000 Encounter for general adult medical examination without abnormal findings: Secondary | ICD-10-CM | POA: Diagnosis not present

## 2016-04-27 DIAGNOSIS — I2510 Atherosclerotic heart disease of native coronary artery without angina pectoris: Secondary | ICD-10-CM

## 2016-04-27 DIAGNOSIS — G4733 Obstructive sleep apnea (adult) (pediatric): Secondary | ICD-10-CM | POA: Diagnosis not present

## 2016-04-27 LAB — HM DIABETES FOOT EXAM

## 2016-04-27 MED ORDER — PNEUMOCOCCAL 13-VAL CONJ VACC IM SUSP
0.5000 mL | INTRAMUSCULAR | Status: AC
  Administered 2016-04-27: 0.5 mL via INTRAMUSCULAR

## 2016-04-27 NOTE — Progress Notes (Signed)
Pre visit review using our clinic review tool, if applicable. No additional management support is needed unless otherwise documented below in the visit note. 

## 2016-04-27 NOTE — Addendum Note (Signed)
Addended by: Carter Kitten on: 04/27/2016 02:03 PM   Modules accepted: Orders

## 2016-04-27 NOTE — Assessment & Plan Note (Signed)
Stable control on no medication. 

## 2016-04-27 NOTE — Patient Instructions (Addendum)
Get shingles vaccine at pharmacy.  Keep up great work on exercise and healthy eating.  Continue on lower dose of metformin.

## 2016-04-27 NOTE — Assessment & Plan Note (Signed)
Well controlled, on low side but pt assymptomatic. Continue current medication.

## 2016-04-27 NOTE — Assessment & Plan Note (Signed)
No flares in last year 

## 2016-04-27 NOTE — Assessment & Plan Note (Addendum)
LDL at goal on low dose crestor. Encouraged continued exercise, weight loss, healthy eating habits to improve trigs and HDL.

## 2016-04-27 NOTE — Assessment & Plan Note (Signed)
Concerning for basal cell. Pt will return next week for shave biopsy.

## 2016-04-27 NOTE — Progress Notes (Signed)
70 year old male presents for  annual wellness, no medicare.   Elevated Cholesterol:  Hx of CAD LDLgoal < 70. NOW at goal on crestor 10 mg daily, fenofibrate, fish oil.   Lab Results  Component Value Date   CHOL 108 02/24/2016   HDL 24.70 (L) 02/24/2016   LDLCALC 49 02/24/2016   LDLDIRECT 71.0 11/24/2015   TRIG 172.0 (H) 02/24/2016   CHOLHDL 4 02/24/2016  Using medications without problems: None Muscle aches: None Diet compliance: poor Exercise: Has been very active lately. Other complaints:  Diabetes:Improved control on metformin higher dose daily.  In last few weeks he has had to decrease metformin back down to 500 mg daily given SE of decrease energy, dizzy. He now feels back to nml self on 1 cap a day. Now able to get more active, and eat better, exercise. Working on [employer] and now exercise.   Lab Results  Component Value Date   HGBA1C 7.9 (H) 04/26/2016  Using medications without difficulties: Hypoglycemic episodes:? Hyperglycemic episodes: yes Feet problems: None, neuropathy stable Blood Sugars averaging: FBS  107-120 eye exam within last year: in last year  Has lost 10 lbs. Wt Readings from Last 3 Encounters:  04/27/16 232 lb 12 oz (105.6 kg)  03/15/16 243 lb 3.2 oz (110.3 kg)  02/25/16 239 lb 8 oz (108.6 kg)    Hypertension:  Well controlled on losartan and bystolic  BP Readings from Last 3 Encounters:  04/27/16 104/62  03/15/16 122/72  02/25/16 124/76  Using medication without problems or lightheadedness:  None Chest pain with exertion: None Edema: None Short of breath: Gets winded more quickly than prior to CAD Average home BPs: not checking Other issues:  Gout: no flares in last year.  OSA, stable on CPAP.    Social History /Family History/Past Medical History reviewed and updated if needed.  Review of Systems  Constitutional: Negative for fever and fatigue.  HENT: Negative for ear pain.  Eyes: Negative for itching.   Respiratory: Negative for cough.  Cardiovascular: Negative for chest pain and leg swelling.  Gastrointestinal: Negative for constipation.      Objective:  Physical Exam  Constitutional: Vital signs are normal. He appears well-developed and well-nourished.  HENT:  Head: Normocephalic.  Right Ear: Hearing normal.  Left Ear: Hearing normal.  Nose: Nose: turbinate swelling and congestion Mouth/Throat: Oropharynx is clear and moist and mucous membranes are normal.  Neck: Trachea normal. Carotid bruit is not present. No thyroid mass and no thyromegaly present.  Cardiovascular: Normal rate, regular rhythm and normal pulses. Exam reveals no gallop, no distant heart sounds and no friction rub.  No murmur heard. No peripheral edema  Pulmonary/Chest: Effort normal and breath sounds: scattered wheeze, no rales or rhonchi. No respiratory distress.  Skin: Skin is warm, dry and intact. No rash noted. Skin lesion, no healing raised rolled borders on left shoulder  Psychiatric: He has a normal mood and affect. His speech is normal and behavior is normal. Thought content normal.   Diabetic foot exam: Normal inspection No skin breakdown No calluses  Normal DP pulses Decreased sensation to light touch and monofilament at toes bilaterally. Nails normal  ASSESMENT AND PLAN: Body mass index is 33.88 kg/m.  Pt agreeable to no longer continuing PSAs. No prostate cancer in family.  Lab Results  Component Value Date   PSA 0.83 04/26/2015   PSA 1.02 11/08/2012   PSA 0.75 07/13/2010   Vaccines: Uptodate with PNA and Td, go to pharm for shingles.  Given prevnar today Colon: 2016 Dr. Deatra Ina hx of polyps repeat in 10 year.. Remote smoker <25 years.

## 2016-04-27 NOTE — Assessment & Plan Note (Signed)
Rate controlled today. Followed by cards, Has upcoming OV.

## 2016-04-27 NOTE — Assessment & Plan Note (Signed)
Feeling better on lower  Dose of metformin and CBS appear to currently be controlled with weight loss and healthy lifestyle. Recheck in 3 months.

## 2016-04-27 NOTE — Assessment & Plan Note (Signed)
Stable on CPAP, followed by pulm.

## 2016-05-05 ENCOUNTER — Ambulatory Visit (INDEPENDENT_AMBULATORY_CARE_PROVIDER_SITE_OTHER): Payer: 59 | Admitting: Family Medicine

## 2016-05-05 ENCOUNTER — Telehealth: Payer: Self-pay

## 2016-05-05 ENCOUNTER — Encounter: Payer: Self-pay | Admitting: Family Medicine

## 2016-05-05 VITALS — BP 112/70 | HR 62 | Temp 98.4°F | Ht 69.5 in | Wt 236.5 lb

## 2016-05-05 DIAGNOSIS — I2510 Atherosclerotic heart disease of native coronary artery without angina pectoris: Secondary | ICD-10-CM

## 2016-05-05 DIAGNOSIS — L989 Disorder of the skin and subcutaneous tissue, unspecified: Secondary | ICD-10-CM

## 2016-05-05 DIAGNOSIS — T148 Other injury of unspecified body region: Secondary | ICD-10-CM | POA: Diagnosis not present

## 2016-05-05 DIAGNOSIS — W57XXXA Bitten or stung by nonvenomous insect and other nonvenomous arthropods, initial encounter: Secondary | ICD-10-CM

## 2016-05-05 DIAGNOSIS — C44619 Basal cell carcinoma of skin of left upper limb, including shoulder: Secondary | ICD-10-CM | POA: Diagnosis not present

## 2016-05-05 MED ORDER — TRIAMCINOLONE ACETONIDE 0.5 % EX CREA: 1.0000 "application " | TOPICAL_CREAM | Freq: Two times a day (BID) | CUTANEOUS | 0 refills | Status: DC

## 2016-05-05 NOTE — Patient Instructions (Signed)
We will call with path results.  Apply Bandaid and neosporin daily.  Okay to wash tommorrow with soap. Keep area clean and dry.

## 2016-05-05 NOTE — Addendum Note (Signed)
Addended by: Eliezer Lofts E on: 05/05/2016 12:37 PM   Modules accepted: Orders

## 2016-05-05 NOTE — Assessment & Plan Note (Signed)
With local inflammatiory allergic reaction.  Will start topical steroid. If not improving or redness spreading in 24 -48 hours... Pt to call for consideration of antibitoics.

## 2016-05-05 NOTE — Progress Notes (Signed)
Shave biopsy  Meds, vitals, and allergies reviewed.   Indication: suspicious lesion, nonhealing  Location: left anterior shouler  Size:1cm  Verbal informed consent obtained.  Pt aware of risks not limited to but including infection,  bleeding, damage to near by organs.  Prep: etoh/betadine  Anesthesia: 1%lidocaine with epi, good effect  Shave made with dermablade. Cautery and curettage performed x 3 given concern of basal cell CA.  Minimal oozing, controlled with cauter and pressure  Tolerated well  Routine postprocedure instructions d/w pt- keep area clean and bandaged, follow up if concerns/spreading erythema/pain.    Addendum: Pt noted prior to procedure.. Inset bite with surrounding erythema , left antecubital fossa.  Erythema appear quickly and is not spreading. No fever, no pain.. Very itchy.  Right antecubital fossa with 4 cm erythema, raised skin like hive, central bite, no pustule.  Slight warmth   Appears most consistent with allergic reaction, no local bacterial infeciton. Will start topical steroid. If not improving in 24 -48 hours... Pt to call for consideration of antibitoics.

## 2016-05-05 NOTE — Telephone Encounter (Signed)
Pt was seen earlier and kenalog cream is not at [facility]. Spoke with Butch Penny CMA and it is being sent now. Pt voiced understanding and was appreciative.

## 2016-05-05 NOTE — Progress Notes (Signed)
Pre visit review using our clinic review tool, if applicable. No additional management support is needed unless otherwise documented below in the visit note. 

## 2016-05-09 ENCOUNTER — Other Ambulatory Visit: Payer: 59

## 2016-05-09 ENCOUNTER — Ambulatory Visit: Payer: 59

## 2016-05-09 ENCOUNTER — Encounter: Payer: 59 | Admitting: Family Medicine

## 2016-05-09 ENCOUNTER — Encounter: Payer: Self-pay | Admitting: Family Medicine

## 2016-05-09 DIAGNOSIS — C44619 Basal cell carcinoma of skin of left upper limb, including shoulder: Secondary | ICD-10-CM | POA: Insufficient documentation

## 2016-06-12 ENCOUNTER — Encounter: Payer: Self-pay | Admitting: Cardiovascular Disease

## 2016-06-12 ENCOUNTER — Ambulatory Visit (INDEPENDENT_AMBULATORY_CARE_PROVIDER_SITE_OTHER): Payer: 59 | Admitting: Cardiovascular Disease

## 2016-06-12 VITALS — BP 100/76 | HR 69 | Ht 69.5 in | Wt 234.5 lb

## 2016-06-12 DIAGNOSIS — I2510 Atherosclerotic heart disease of native coronary artery without angina pectoris: Secondary | ICD-10-CM

## 2016-06-12 DIAGNOSIS — I483 Typical atrial flutter: Secondary | ICD-10-CM

## 2016-06-12 DIAGNOSIS — E785 Hyperlipidemia, unspecified: Secondary | ICD-10-CM | POA: Diagnosis not present

## 2016-06-12 DIAGNOSIS — R0602 Shortness of breath: Secondary | ICD-10-CM | POA: Diagnosis not present

## 2016-06-12 DIAGNOSIS — I10 Essential (primary) hypertension: Secondary | ICD-10-CM

## 2016-06-12 DIAGNOSIS — I25118 Atherosclerotic heart disease of native coronary artery with other forms of angina pectoris: Secondary | ICD-10-CM | POA: Diagnosis not present

## 2016-06-12 MED ORDER — LOSARTAN POTASSIUM 50 MG PO TABS: 50.0000 mg | ORAL_TABLET | Freq: Every day | ORAL | 5 refills | Status: DC

## 2016-06-12 MED ORDER — LOSARTAN POTASSIUM 50 MG PO TABS: 50.0000 mg | ORAL_TABLET | Freq: Every day | ORAL | 2 refills | Status: DC

## 2016-06-12 NOTE — Patient Instructions (Addendum)
Medication Instructions:  Your physician has recommended you make the following change in your medication:  DECREASE losartan to 50mg  once daily   Labwork: none  Testing/Procedures: Your physician has requested that you have an echocardiogram. Echocardiography is a painless test that uses sound waves to create images of your heart. It provides your doctor with information about the size and shape of your heart and how well your heart's chambers and valves are working. This procedure takes approximately one hour. There are no restrictions for this procedure.  Your physician has requested that you have a lexiscan myoview. For further information please visit [email or web address]. Please follow instruction sheet, as given.  [place]  Your caregiver has ordered a Stress Test with nuclear imaging. The purpose of this test is to evaluate the blood supply to your heart muscle. This procedure is referred to as a "Non-Invasive Stress Test." This is because other than having an IV started in your vein, nothing is inserted or "invades" your body. Cardiac stress tests are done to find areas of poor blood flow to the heart by determining the extent of coronary artery disease (CAD). Some patients exercise on a treadmill, which naturally increases the blood flow to your heart, while others who are  unable to walk on a treadmill due to physical limitations have a pharmacologic/chemical stress agent called Lexiscan . This medicine will mimic walking on a treadmill by temporarily increasing your coronary blood flow.   Please note: these test may take anywhere between 2-4 hours to complete  PLEASE REPORT TO [place] AT THE FIRST DESK WILL DIRECT YOU WHERE TO GO  Date of Procedure:___Thursday, Sept 28__  Arrival Time for Procedure:__8:15am__  Instructions regarding medication:   __xx__ : Hold diabetes medication morning of procedure    PLEASE NOTIFY THE OFFICE AT  LEAST 24 HOURS IN ADVANCE IF YOU ARE UNABLE TO KEEP YOUR APPOINTMENT.  [phone] AND  PLEASE NOTIFY NUCLEAR MEDICINE AT [facility] AT LEAST 24 HOURS IN ADVANCE IF YOU ARE UNABLE TO KEEP YOUR APPOINTMENT. [phone]  How to prepare for your Myoview test:  1. Do not eat or drink after midnight 2. No caffeine for 24 hours prior to test 3. No smoking 24 hours prior to test. 4. Your medication may be taken with water.  If your doctor stopped a medication because of this test, do not take that medication. 5. Ladies, please do not wear dresses.  Skirts or pants are appropriate. Please wear a short sleeve shirt. 6. No perfume, cologne or lotion. 7. Wear comfortable walking shoes. No heels!            Follow-Up: Your physician recommends that you schedule a follow-up appointment in: 6 months with Dr. Fletcher Anon.    Any Other Special Instructions Will Be Listed Below (If Applicable).     If you need a refill on your cardiac medications before your next appointment, please call your pharmacy.  Echocardiogram An echocardiogram, or echocardiography, uses sound waves (ultrasound) to produce an image of your heart. The echocardiogram is simple, painless, obtained within a short period of time, and offers valuable information to your health care provider. The images from an echocardiogram can provide information such as:  Evidence of coronary artery disease (CAD).  Heart size.  Heart muscle function.  Heart valve function.  Aneurysm detection.  Evidence of a past heart attack.  Fluid buildup around the heart.  Heart muscle thickening.  Assess heart valve function. [place]  PROVIDER KNOW ABOUT:  Any allergies you have.  All medicines you are taking, including vitamins, herbs, eye drops, creams, and over-the-counter medicines.  Previous problems you or members of your family have had with the use of anesthetics.  Any blood disorders you have.  Previous surgeries you  have had.  Medical conditions you have.  Possibility of pregnancy, if this applies. BEFORE THE PROCEDURE  No special preparation is needed. Eat and drink normally.  PROCEDURE   In order to produce an image of your heart, gel will be applied to your chest and a wand-like tool (transducer) will be moved over your chest. The gel will help transmit the sound waves from the transducer. The sound waves will harmlessly bounce off your heart to allow the heart images to be captured in real-time motion. These images will then be recorded.  You may need an IV to receive a medicine that improves the quality of the pictures. AFTER THE PROCEDURE You may return to your normal schedule including diet, activities, and medicines, unless your health care provider tells you otherwise.   This information is not intended to replace advice given to you by your health care provider. Make sure you discuss any questions you have with your health care provider.   Document Released: 09/15/2000 Document Revised: 10/09/2014 Document Reviewed: 05/26/2013 Elsevier Interactive Patient Education 2016 [place]. Cardiac Nuclear Scanning A cardiac nuclear scan is used to check your heart for problems, such as the following:  A portion of the heart is not getting enough blood.  Part of the heart muscle has died, which happens with a heart attack.  The heart wall is not working normally.  In this test, a radioactive dye (tracer) is injected into your bloodstream. After the tracer has traveled to your heart, a scanning device is used to measure how much of the tracer is absorbed by or distributed to various areas of your heart. LET [facility] CARE PROVIDER KNOW ABOUT:  Any allergies you have.  All medicines you are taking, including vitamins, herbs, eye drops, creams, and over-the-counter medicines.  Previous problems you or members of your family have had with the use of anesthetics.  Any blood disorders you  have.  Previous surgeries you have had.  Medical conditions you have.  RISKS AND COMPLICATIONS Generally, this is a safe procedure. However, as with any procedure, problems can occur. Possible problems include:   Serious chest pain.  Rapid heartbeat.  Sensation of warmth in your chest. This usually passes quickly. BEFORE THE PROCEDURE Ask your health care provider about changing or stopping your regular medicines. PROCEDURE This procedure is usually done at a hospital and takes 2-4 hours.  An IV tube is inserted into one of your veins.  Your health care provider will inject a small amount of radioactive tracer through the tube.  You will then wait for 20-40 minutes while the tracer travels through your bloodstream.  You will lie down on an exam table so images of your heart can be taken. Images will be taken for about 15-20 minutes.  You will exercise on a treadmill or stationary bike. While you exercise, your heart activity will be monitored with an electrocardiogram (ECG), and your blood pressure will be checked.  If you are unable to exercise, you may be given a medicine to make your heart beat faster.  When blood flow to your heart has peaked, tracer will again be injected through the IV tube.  After 20-40 minutes, you will get  back on the exam table and have more images taken of your heart.  When the procedure is over, your IV tube will be removed. AFTER THE PROCEDURE  You will likely be able to leave shortly after the test. Unless your health care provider tells you otherwise, you may return to your normal schedule, including diet, activities, and medicines.  Make sure you find out how and when you will get your test results.   This information is not intended to replace advice given to you by your health care provider. Make sure you discuss any questions you have with your health care provider.   Document Released: 10/13/2004 Document Revised: 09/23/2013 Document  Reviewed: 08/27/2013 Elsevier Interactive Patient Education [employer].

## 2016-06-12 NOTE — Progress Notes (Signed)
  Cardiology Office Note   Date:  06/12/2016   ID:  Tommy Sullivan, DOB 05/29/1946, MRN [id number]  PCP:  Amy Bedsole, MD  Cardiologist:   Muhammad Arida, MD   Chief Complaint  Patient presents with  . Other    6 month follow up. Meds reviewed by the pt. verbally. Pt. c/o vertigo and shortness of breath.       History of Present Illness: Tommy Sullivan is a 70 y.o. male who presents for a follow-up visit regarding coronary artery disease and atrial flutter status post ablation.  He has known history of coronary artery disease with previous myocardial infarction  in 2009 with PCI done by Dr. Mann and no cardiac events since then.  He has other chronic medical conditions that include type 2 diabetes, hypertension and hyperlipidemia.  Most recent nuclear stress test showed evidence of prior inferior and inferolateral infarct without significant ischemia. Ejection fraction was 47%.  He underwent atrial flutter ablation in August 2015 by Dr. Klein. Echocardiogram in October 2015 showed an ejection fraction of 55-60%, mildly dilated left atrium and no evidence of pulmonary hypertension.  Since last visit, he reports significant worsening of exertional dyspnea and also reports increased orthostatic dizziness with an episode of presyncope yesterday when he was carrying a heavy box. He thought that the shortness of breath was related to increased dose of metformin. He denies any chest pain or discomfort. There is no orthopnea, PND or leg edema. He does have sleep apnea but uses a CPAP on a regular basis.   Past Medical History:  Diagnosis Date  . Allergy   . Arthritis   . Atrial flutter (HCC)   . Coronary atherosclerosis of unspecified type of vessel, native or graft   . Diabetes mellitus without complication (HCC)   . Extrinsic asthma, unspecified   . Glaucoma   . Gout   . Hyperlipidemia   . Hypertension   . Myocardial infarction (HCC) 1/09   PCI done by Dr. Mann in [place]    . Retinal vascular occlusion, unspecified    Right    Past Surgical History:  Procedure Laterality Date  . ABLATION  05-07-2014   EPS and ablation of atrial flutter by Dr Klein  . APPENDECTOMY  1954  . ATRIAL FLUTTER ABLATION N/A 05/07/2014   Procedure: ATRIAL FLUTTER ABLATION;  Surgeon: Steven C Klein, MD;  Location: [facility] CATH LAB;  Service: Cardiovascular;  Laterality: N/A;  . COLONOSCOPY    . CORONARY ANGIOPLASTY  2009   stent [facility]  . NECK SURGERY  1990's   x2, Fusion  . SHOULDER ARTHROSCOPY WITH SUBACROMIAL DECOMPRESSION Left 04/02/2014   Procedure: SHOULDER ARTHROSCOPY WITH SUBACROMIAL DECOMPRESSION WITH ROTATOR CUFF REPAIR;  Surgeon: Justin William Chandler, MD;  Location: [facility];  Service: Orthopedics;  Laterality: Left;  Left shoulder rotator cuff repair, subacromial decompression  . SKIN SURGERY    . TONSILLECTOMY       Current Outpatient Prescriptions  Medication Sig Dispense Refill  . aspirin EC 81 MG tablet Take 1 tablet (81 mg total) by mouth daily. 90 tablet 3  . Blood Glucose Monitoring Suppl (BAYER CONTOUR NEXT MONITOR) W/DEVICE KIT 1 each by Does not apply route. Use to check blood sugar 2 times daily.  Dx: E11.9    . BYSTOLIC 10 MG tablet TAKE 1 TABLET BY MOUTH DAILY 90 tablet 1  . Cholecalciferol (VITAMIN D3) 1000 units CAPS Take 1 capsule by mouth daily.    .   fenofibrate 160 MG tablet TAKE 1 TABLET BY MOUTH DAILY 90 tablet 1  . glucose blood (BAYER CONTOUR NEXT TEST) test strip Use to check blood sugar two times daily.  Dx: E11.9 100 each 12  . latanoprost (XALATAN) 0.005 % ophthalmic solution Place 1 drop into the left eye at bedtime.   3  . losartan (COZAAR) 100 MG tablet TAKE ONE TABLET BY MOUTH DAILY 90 tablet 1  . metFORMIN (GLUMETZA) 500 MG (MOD) 24 hr tablet Take 500 mg by mouth daily with breakfast.    . modafinil (PROVIGIL) 200 MG tablet Take 1 tablet (200 mg total) by mouth daily. Once daily 90 tablet 3  . Omega-3 Fatty Acids  (FISH OIL) 1000 MG CAPS Take 2 capsules by mouth 2 (two) times daily.     . rosuvastatin (CRESTOR) 10 MG tablet TAKE 1 TABLET BY MOUTH DAILY. 90 tablet 1  . tamsulosin (FLOMAX) 0.4 MG CAPS capsule TAKE 1 CAPSULE BY MOUTH DAILY 90 capsule 1   No current facility-administered medications for this visit.     Allergies:   Review of patient's allergies indicates no known allergies.    Social History:  The patient  reports that he quit smoking about 27 years ago. His smoking use included Cigarettes. He has a 20.00 pack-year smoking history. He has never used smokeless tobacco. He reports that he drinks about 1.8 oz of alcohol per week . He reports that he does not use drugs.   Family History:  The patient's family history includes AAA (abdominal aortic aneurysm) in his father; Coronary artery disease in his brother; Diabetes in his brother, mother, and sister; Heart disease in his mother.    ROS:  Please see the history of present illness.   Otherwise, review of systems are positive for none.   All other systems are reviewed and negative.    PHYSICAL EXAM: VS:  BP 100/76 (BP Location: Left Arm, Patient Position: Sitting, Cuff Size: Normal)   Pulse 69   Ht 5' 9.5" (1.765 m)   Wt 234 lb 8 oz (106.4 kg)   BMI 34.13 kg/m  , BMI Body mass index is 34.13 kg/m. GEN: Well nourished, well developed, in no acute distress  HEENT: normal  Neck: no JVD, carotid bruits, or masses Cardiac: RRR; no murmurs, rubs, or gallops,no edema  Respiratory:  clear to auscultation bilaterally, normal work of breathing GI: soft, nontender, nondistended, + BS MS: no deformity or atrophy  Skin: warm and dry, no rash Neuro:  Strength and sensation are intact Psych: euthymic mood, full affect   EKG:  EKG is ordered today. The ekg ordered today demonstrates : Normal sinus rhythm with old anteroseptal infarct and old inferior infarct. QRS widening. Left bundle branch block morphology.   Recent Labs: 02/24/2016: ALT  23; BUN 17; Creatinine, Ser 1.16; Potassium 4.0; Sodium 138    Lipid Panel    Component Value Date/Time   CHOL 108 02/24/2016 0938   TRIG 172.0 (H) 02/24/2016 0938   HDL 24.70 (L) 02/24/2016 0938   CHOLHDL 4 02/24/2016 0938   VLDL 34.4 02/24/2016 0938   LDLCALC 49 02/24/2016 0938   LDLDIRECT 71.0 11/24/2015 0831      Wt Readings from Last 3 Encounters:  06/12/16 234 lb 8 oz (106.4 kg)  05/05/16 236 lb 8 oz (107.3 kg)  04/27/16 232 lb 12 oz (105.6 kg)      No flowsheet data found.    ASSESSMENT AND PLAN:  1.  Coronary artery disease involving   native coronary arteries with other forms of angina: The patient reports significant worsening of exertional dyspnea interfering with activities of daily living with no chest pain. He has known history of coronary artery disease. I recommend evaluation with a pharmacologic nuclear stress test. Given that his EKG shows a left bundle branch block morphology, a treadmill stress test is sufficient. Given that his symptoms are predominantly shortness of breath, I also requested an echocardiogram to evaluate his pulmonary pressure.  2. History of atrial flutter status post RF ablation in 2015: No evidence of recurrent arrhythmia.  3. Essential hypertension: His blood pressure continues to be somewhat on the low side and he continues to have orthostatic dizziness. Thus, I decreased the dose of losartan to 50 mg daily.  4. Hyperlipidemia: Most recent lipid profile she is showed significant improvement in triglyceride 172. His LDL was 49. Continue rosuvastatin and fenofibrate.  6. Diabetes mellitus: Managed by his primary care physician with some improvement and hemoglobin A1c recently.   Disposition:   FU with me in 6 months  Signed,  Muhammad Arida, MD  06/12/2016 1:48 PM    [facility] 

## 2016-06-14 ENCOUNTER — Other Ambulatory Visit: Payer: Self-pay

## 2016-06-14 VITALS — BP 104/64 | HR 70 | Resp 14 | Ht 70.5 in | Wt 233.1 lb

## 2016-06-14 DIAGNOSIS — E119 Type 2 diabetes mellitus without complications: Secondary | ICD-10-CM

## 2016-06-14 NOTE — Patient Outreach (Signed)
[facility]) Care Management   06/14/2016  Tommy Sullivan 07/14/1946 [id number]  Tommy Sullivan is an 70 y.o. male.   Member seen for follow up office visit for Link to Wellness program for self management of Type 2 diabetes  Subjective:  Member states that he saw Dr. Eugenie Norrie yesterday and he is to have more tests to see why he is having dizziness and SOB.  States that he did decrease his losartan but it is too soon to tell if that will help.  States that his blood sugars have improved and his hemoglobin A1C was down to 7.9%.  States he is back to taking only one metformin a day and he is tolerating that dose.  States that his wife is retiring next month. States that they will be moving their insurance to [facility] starting November .  States he has been exercising 4-5 times a week and lifting weights 3-4 times a week.  States he has been trying to eat a well balanced diet with more protein with his snacks.  States that his wife has been following a stricter diet so it has helped him eat better.  Objective:   Review of Systems  Respiratory: Positive for shortness of breath.   Neurological: Positive for dizziness.    Physical Exam Today's Vitals   06/14/16 1052 06/14/16 1058  BP: 104/64   Pulse: 70   Resp: 14   SpO2: 98%   Weight: 233 lb 1.6 oz (105.7 kg)   Height: 1.791 m (5' 10.5")   PainSc: 0-No pain 0-No pain   Encounter Medications:   Outpatient Encounter Prescriptions as of 06/14/2016  Medication Sig Note  . aspirin EC 81 MG tablet Take 1 tablet (81 mg total) by mouth daily.   . Blood Glucose Monitoring Suppl (BAYER CONTOUR NEXT MONITOR) W/DEVICE KIT 1 each by Does not apply route. Use to check blood sugar 2 times daily.  Dx: O87.8   . BYSTOLIC 10 MG tablet TAKE 1 TABLET BY MOUTH DAILY   . Cholecalciferol (VITAMIN D3) 1000 units CAPS Take 1 capsule by mouth daily.   . fenofibrate 160 MG tablet TAKE 1 TABLET BY MOUTH DAILY   . glucose blood (BAYER CONTOUR  NEXT TEST) test strip Use to check blood sugar two times daily.  Dx: E11.9   . latanoprost (XALATAN) 0.005 % ophthalmic solution Place 1 drop into the left eye at bedtime.  04/27/2016: Received from: External Pharmacy  . losartan (COZAAR) 50 MG tablet Take 1 tablet (50 mg total) by mouth daily.   . metFORMIN (GLUMETZA) 500 MG (MOD) 24 hr tablet Take 500 mg by mouth daily with breakfast.   . modafinil (PROVIGIL) 200 MG tablet Take 1 tablet (200 mg total) by mouth daily. Once daily   . Omega-3 Fatty Acids (FISH OIL) 1000 MG CAPS Take 2 capsules by mouth 2 (two) times daily.    . rosuvastatin (CRESTOR) 10 MG tablet TAKE 1 TABLET BY MOUTH DAILY.   . tamsulosin (FLOMAX) 0.4 MG CAPS capsule TAKE 1 CAPSULE BY MOUTH DAILY    No facility-administered encounter medications on file as of 06/14/2016.     Functional Status:   In your present state of health, do you have any difficulty performing the following activities: 06/14/2016 03/15/2016  Hearing? N N  Vision? N N  Difficulty concentrating or making decisions? N N  Walking or climbing stairs? N N  Dressing or bathing? N N  Doing errands, shopping? N N  Some  recent data might be hidden    Fall/Depression Screening:    PHQ 2/9 Scores 06/14/2016 04/27/2016 03/15/2016 12/01/2015 09/01/2015 05/19/2015 01/20/2015  PHQ - 2 Score 0 0 0 0 0 0 0    Assessment:    Member seen for follow up office visit for Link to Wellness program for self management of Type 2 diabetes. Member not meeting diabetes self management goal of hemoglobin A1C of 7% or less with last reading 7.9% which is improved from 9.6%. Member reports better  adherent with diet or exercise.  Reports blood sugars ranging from 140-180 fasting and 150-200 post prandial. Member continues to  report SOB with exertion at times and occasional dizziness.  Member is being worked up for the SOB and dizziness. Member will be transitioning to a Medicare Advantage plan 08/02/16 and plan to close Link to Wellness  program then.  Plan:  Plan to limit carbohydrates.  Plan to eat protein with each meal.  Try to eat 45-60 Gm of carbohydrate each meal and 15Gm for snacks.  Plan to eat protein with snacks Plan to check blood sugar 2-3 times a day - fasting or 1 -2hrs after a meal.  Goals of 80-120 fasting or less than 180 after meals Plan to exercise 3-4 times a week for 30-60 minutes in the gym. Plan to do strength training 4 times a week.  Try to work up to 150 minutes a week  Plan for Link to Wellness phone call on 08/04/16 at 9:30AM to close case  [facility] CM Care Plan Problem One   Flowsheet Row Most Recent Value  Care Plan Problem One  Elevated blood sugars related to dx of Type 2 DM as evidenced by hemoglobin A1C of 7.8  Role Documenting the Problem One  Care Management [place] for Problem One  Active  [facility] Long Term Goal (31-90 days)  Member will decrease his hemoglobin A1C to 7.0 or below in the next 90 days  [facility] Long Term Goal Start Date  06/14/16 [Continue last hemoglobin A1C  improved to 7.9%]  Interventions for Problem One Long Term Goal   Reviewed CHO counting and portion control, Reinforced  to have protein with each meal and snacks,, Reinforced on the importance of  regular exercise to glycemic control, Instructed that he will no longer be eligible for the Link to Wellness program when he no longer has the [employer] and his case will be closed, discussed transitioning to Medicare     Peter Garter RN, [facility] Care Management Coordinator-Link to [place] Management [phone]

## 2016-06-14 NOTE — Patient Instructions (Signed)
1. Plan to limit carbohydrates.  Plan to eat protein with each meal.  Try to eat 45-60 Gm of carbohydrate each meal and 15Gm for snacks.  Plan to eat protein with snacks 2. Plan to check blood sugar 2-3 times a day - fasting or 1 -2hrs after a meal.  Goals of 80-120 fasting or less than 180 after meals 3. Plan to exercise 3-4 times a week for 30-60 minutes in the gym. Plan to do strength training 4 times a week.  Try to work up to 150 minutes a week  4. Plan for Link to Wellness phone call on 08/04/16 at 9:30AM to close case

## 2016-06-15 ENCOUNTER — Ambulatory Visit (INDEPENDENT_AMBULATORY_CARE_PROVIDER_SITE_OTHER): Payer: 59

## 2016-06-15 ENCOUNTER — Other Ambulatory Visit: Payer: Self-pay

## 2016-06-15 DIAGNOSIS — R0602 Shortness of breath: Secondary | ICD-10-CM

## 2016-06-15 DIAGNOSIS — H40002 Preglaucoma, unspecified, left eye: Secondary | ICD-10-CM | POA: Diagnosis not present

## 2016-06-22 ENCOUNTER — Other Ambulatory Visit: Payer: 59

## 2016-06-28 ENCOUNTER — Telehealth: Payer: Self-pay | Admitting: Cardiovascular Disease

## 2016-06-28 NOTE — Telephone Encounter (Signed)
Attempted to contact pt again. DPR states "It is NOT ok to leave a message on any telephone of Tommy Sullivan". No answer. Did not leave message.

## 2016-06-28 NOTE — Telephone Encounter (Signed)
Attempted to contact pt to review myoview instructions. No answer, no VM.  Will call again.

## 2016-06-29 ENCOUNTER — Encounter
Admission: RE | Admit: 2016-06-29 | Discharge: 2016-06-29 | Disposition: A | Discharge status: Home / Self Care | Payer: 59 | Source: Ambulatory Visit | Attending: Cardiovascular Disease | Admitting: Cardiovascular Disease

## 2016-06-29 DIAGNOSIS — R0602 Shortness of breath: Secondary | ICD-10-CM

## 2016-06-29 LAB — NM MYOCAR MULTI W/SPECT W/WALL MOTION / EF
CHL CUP NUCLEAR SSS: 13
CSEPHR: 46 %
LV sys vol: 88 mL
LVDIAVOL: 158 mL (ref 62–150)
NUC STRESS TID: 1.13
Peak HR: 69 {beats}/min
Rest HR: 63 {beats}/min
SDS: 5
SRS: 8

## 2016-06-29 MED ORDER — REGADENOSON 0.4 MG/5ML IV SOLN
0.4000 mg | Freq: Once | INTRAVENOUS | Status: DC
  Filled 2016-06-29: qty 5

## 2016-06-29 MED ORDER — TECHNETIUM TC 99M TETROFOSMIN IV KIT
30.4400 | PACK | Freq: Once | INTRAVENOUS | Status: AC | PRN
  Administered 2016-06-29: 30.44 via INTRAVENOUS

## 2016-06-29 MED ORDER — TECHNETIUM TC 99M TETROFOSMIN IV KIT
13.5000 | PACK | Freq: Once | INTRAVENOUS | Status: AC | PRN
  Administered 2016-06-29: 12.998 via INTRAVENOUS

## 2016-07-03 ENCOUNTER — Other Ambulatory Visit: Payer: Self-pay

## 2016-07-03 ENCOUNTER — Telehealth: Payer: Self-pay | Admitting: Cardiovascular Disease

## 2016-07-03 DIAGNOSIS — Z01812 Encounter for preprocedural laboratory examination: Secondary | ICD-10-CM

## 2016-07-03 NOTE — Telephone Encounter (Signed)
Patient was informed the office would call with the results (never that MD would call). Yes, he was told the office would call the next day because that is generally how it goes. Unfortunately, I did not Dr. Tyrell Antonio was out of the office. Thus, results were unable to be reviewed.

## 2016-07-03 NOTE — Telephone Encounter (Addendum)
Reviewed results w/pt. See results note. He would like to proceed w/cardiac cath and he continues to be SOB. Pt will call back after reviewing available times with his wife.   Informed Izora Gala of pt concerns.

## 2016-07-03 NOTE — Telephone Encounter (Signed)
Pt called asking about results on Myoview he did last week on 06/29/16 Pt states he was told "Dr Fletcher Anon would be giving him a call tomorrow" which would have been 06/30/16. Pt states he did not receive a call, he couldn't go out of town this past weekend for he was told he would get a call. Pt was very upset for he stated Thurmond Butts told him that he was going to get a call and now he is worried because no one called him. Stated to him it normally takes a few days and  he was very upset and asked me why is he telling patients this then. He would like a call back today. Told him most likely the nurse would call with the results and he got upset again for he said ryan told him Dr Fletcher Anon would be the one to call him back.  told him no normally it is the nurse. He stated this is all unorganized and no one seems to be communicating with each other. I apologized several times.  Told him we would call as soon as the results are done. He got mad and asked "are you a doctor, if not then tell them to call me". He did not want to talk to me anymore.  He just wanted to know if he could go out of town based of the results.  Please call back.

## 2016-07-03 NOTE — Patient Instructions (Signed)
Angiogram An angiogram, also called angiography, is a procedure used to look at the blood vessels. In this procedure, dye is injected through a long, thin tube (catheter) into an artery. X-rays are then taken. The X-rays will show if there is a blockage or problem in a blood vessel.  LET YOUR HEALTH CARE PROVIDER KNOW ABOUT:  Any allergies you have, including allergies to shellfish or contrast dye.   All medicines you are taking, including vitamins, herbs, eye drops, creams, and over-the-counter medicines.   Previous problems you or members of your family have had with the use of anesthetics.   Any blood disorders you have.   Previous surgeries you have had.  Any previous kidney problems or failure you have had.  Medical conditions you have.   Possibility of pregnancy, if this applies. RISKS AND COMPLICATIONS Generally, an angiogram is a safe procedure. However, as with any procedure, problems can occur. Possible problems include:  Injury to the blood vessels, including rupture or bleeding.  Infection or bruising at the catheter site.  Allergic reaction to the dye or contrast used.  Kidney damage from the dye or contrast used.  Blood clots that can lead to a stroke or heart attack. BEFORE THE PROCEDURE  Do not eat or drink after midnight on the night before the procedure, or as directed by your health care provider.   Ask your health care provider if you may drink enough water to take any needed medicines the morning of the procedure.  PROCEDURE  You may be given a medicine to help you relax (sedative) before and during the procedure. This medicine is given through an IV access tube that is inserted into one of your veins.   The area where the catheter will be inserted will be washed and shaved. This is usually done in the groin but may be done in the fold of your arm (near your elbow) or in the wrist.  A medicine will be given to numb the area where the catheter  will be inserted (local anesthetic).  The catheter will be inserted with a guide wire into an artery. The catheter is guided by using a type of X-ray (fluoroscopy) to the blood vessel being examined.   Dye is then injected into the catheter, and X-rays are taken. The dye helps to show where any narrowing or blockages are located.  AFTER THE PROCEDURE   If the procedure is done through the leg, you will be kept in bed lying flat for several hours. You will be instructed to not bend or cross your legs.  The insertion site will be checked frequently.  The pulse in your feet or wrist will be checked frequently.  Additional blood tests, X-rays, and electrocardiography may be done.   You may need to stay in the hospital overnight for observation.    This information is not intended to replace advice given to you by your health care provider. Make sure you discuss any questions you have with your health care provider.   Document Released: 06/28/2005 Document Revised: 10/09/2014 Document Reviewed: 02/19/2013 Elsevier Interactive Patient Education 2016 Else[employer] Angiogram, Care After Refer to this sheet in the next few weeks. These instructions provide you with information about caring for yourself after your procedure. Your health care provider may also give you more specific instructions. Your treatment has been planned according to current medical practices, but problems sometimes occur. Call your health care provider if you have any problems or questions after your procedure.   WHAT TO EXPECT AFTER THE PROCEDURE After your procedure, it is typical to have the following:  Bruising at the catheter insertion site that usually fades within 1-2 weeks.  Blood collecting in the tissue (hematoma) that may be painful to the touch. It should usually decrease in size and tenderness within 1-2 weeks. HOME CARE INSTRUCTIONS  Take medicines only as directed by your health care provider.  You may  shower 24-48 hours after the procedure or as directed by your health care provider. Remove the bandage (dressing) and gently wash the site with plain soap and water. Pat the area dry with a clean towel. Do not rub the site, because this may cause bleeding.  Do not take baths, swim, or use a hot tub until your health care provider approves.  Check your insertion site every day for redness, swelling, or drainage.  Do not apply powder or lotion to the site.  Do not lift over 10 lb (4.5 kg) for 5 days after your procedure or as directed by your health care provider.  Ask your health care provider when it is okay to:  Return to work or school.  Resume usual physical activities or sports.  Resume sexual activity.  Do not drive home if you are discharged the same day as the procedure. Have someone else drive you.  You may drive 24 hours after the procedure unless otherwise instructed by your health care provider.  Do not operate machinery or power tools for 24 hours after the procedure or as directed by your health care provider.  If your procedure was done as an outpatient procedure, which means that you went home the same day as your procedure, a responsible adult should be with you for the first 24 hours after you arrive home.  Keep all follow-up visits as directed by your health care provider. This is important. SEEK MEDICAL CARE IF:  You have a fever.  You have chills.  You have increased bleeding from the catheter insertion site. Hold pressure on the site. SEEK IMMEDIATE MEDICAL CARE IF:  You have unusual pain at the catheter insertion site.  You have redness, warmth, or swelling at the catheter insertion site.  You have drainage (other than a small amount of blood on the dressing) from the catheter insertion site.  The catheter insertion site is bleeding, and the bleeding does not stop after 30 minutes of holding steady pressure on the site.  The area near or just beyond  the catheter insertion site becomes pale, cool, tingly, or numb.   This information is not intended to replace advice given to you by your health care provider. Make sure you discuss any questions you have with your health care provider.   Document Released: 04/06/2005 Document Revised: 10/09/2014 Document Reviewed: 02/19/2013 Elsevier Interactive Patient Education [employer].

## 2016-07-05 ENCOUNTER — Other Ambulatory Visit (INDEPENDENT_AMBULATORY_CARE_PROVIDER_SITE_OTHER): Payer: 59 | Admitting: *Deleted

## 2016-07-05 ENCOUNTER — Other Ambulatory Visit: Payer: Self-pay | Admitting: Cardiovascular Disease

## 2016-07-05 DIAGNOSIS — H40002 Preglaucoma, unspecified, left eye: Secondary | ICD-10-CM | POA: Diagnosis not present

## 2016-07-05 DIAGNOSIS — Z01812 Encounter for preprocedural laboratory examination: Secondary | ICD-10-CM

## 2016-07-05 DIAGNOSIS — I208 Other forms of angina pectoris: Secondary | ICD-10-CM

## 2016-07-06 LAB — CBC
HEMATOCRIT: 39.4 % (ref 37.5–51.0)
Hemoglobin: 12.8 g/dL (ref 12.6–17.7)
MCH: 29.2 pg (ref 26.6–33.0)
MCHC: 32.5 g/dL (ref 31.5–35.7)
MCV: 90 fL (ref 79–97)
Platelets: 118 10*3/uL — ABNORMAL LOW (ref 150–379)
RBC: 4.38 x10E6/uL (ref 4.14–5.80)
RDW: 14.1 % (ref 12.3–15.4)
WBC: 6.2 10*3/uL (ref 3.4–10.8)

## 2016-07-06 LAB — BASIC METABOLIC PANEL
BUN / CREAT RATIO: 16 (ref 10–24)
BUN: 18 mg/dL (ref 8–27)
CHLORIDE: 98 mmol/L (ref 96–106)
CO2: 25 mmol/L (ref 18–29)
Calcium: 9.8 mg/dL (ref 8.6–10.2)
Creatinine, Ser: 1.11 mg/dL (ref 0.76–1.27)
GFR calc non Af Amer: 67 mL/min/{1.73_m2} (ref >59)
GFR, EST AFRICAN AMERICAN: 77 mL/min/{1.73_m2} (ref >59)
Glucose: 182 mg/dL — ABNORMAL HIGH (ref 65–99)
POTASSIUM: 4.2 mmol/L (ref 3.5–5.2)
SODIUM: 137 mmol/L (ref 134–144)

## 2016-07-06 LAB — PROTIME-INR
INR: 1.1 (ref 0.8–1.2)
PROTHROMBIN TIME: 11.8 s (ref 9.1–12.0)

## 2016-07-07 ENCOUNTER — Telehealth: Payer: Self-pay | Admitting: Cardiovascular Disease

## 2016-07-07 NOTE — Telephone Encounter (Signed)
Reviewed labs and cath instructions w/pt who verbalized understanding.  Cath 10/9, 8:30am arrival. He had no further questions at this time.

## 2016-07-10 ENCOUNTER — Encounter
Admission: RE | Disposition: A | Discharge status: Other Acute Inpatient Hospital | Payer: Self-pay | Source: Ambulatory Visit | Attending: Cardiovascular Disease

## 2016-07-10 ENCOUNTER — Ambulatory Visit
Admission: RE | Admit: 2016-07-10 | Discharge: 2016-07-10 | Disposition: A | Discharge status: Other Acute Inpatient Hospital | Payer: 59 | Source: Ambulatory Visit | Attending: Cardiovascular Disease | Admitting: Cardiovascular Disease

## 2016-07-10 ENCOUNTER — Inpatient Hospital Stay (HOSPITAL_COMMUNITY)
Admission: AD | Admit: 2016-07-10 | Discharge: 2016-07-23 | DRG: 233 | Disposition: A | Discharge status: Home / Self Care | Payer: 59 | Source: Other Acute Inpatient Hospital | Attending: Cardiothoracic Surgery | Admitting: Cardiothoracic Surgery

## 2016-07-10 ENCOUNTER — Other Ambulatory Visit: Payer: Self-pay | Admitting: *Deleted

## 2016-07-10 ENCOUNTER — Encounter: Payer: Self-pay | Admitting: *Deleted

## 2016-07-10 DIAGNOSIS — I110 Hypertensive heart disease with heart failure: Secondary | ICD-10-CM | POA: Diagnosis not present

## 2016-07-10 DIAGNOSIS — I252 Old myocardial infarction: Secondary | ICD-10-CM | POA: Insufficient documentation

## 2016-07-10 DIAGNOSIS — T502X5A Adverse effect of carbonic-anhydrase inhibitors, benzothiadiazides and other diuretics, initial encounter: Secondary | ICD-10-CM | POA: Diagnosis not present

## 2016-07-10 DIAGNOSIS — I25110 Atherosclerotic heart disease of native coronary artery with unstable angina pectoris: Principal | ICD-10-CM | POA: Diagnosis present

## 2016-07-10 DIAGNOSIS — D62 Acute posthemorrhagic anemia: Secondary | ICD-10-CM | POA: Diagnosis not present

## 2016-07-10 DIAGNOSIS — Z8249 Family history of ischemic heart disease and other diseases of the circulatory system: Secondary | ICD-10-CM

## 2016-07-10 DIAGNOSIS — Z955 Presence of coronary angioplasty implant and graft: Secondary | ICD-10-CM

## 2016-07-10 DIAGNOSIS — I5021 Acute systolic (congestive) heart failure: Secondary | ICD-10-CM | POA: Diagnosis not present

## 2016-07-10 DIAGNOSIS — R0602 Shortness of breath: Secondary | ICD-10-CM | POA: Diagnosis not present

## 2016-07-10 DIAGNOSIS — H5461 Unqualified visual loss, right eye, normal vision left eye: Secondary | ICD-10-CM | POA: Diagnosis present

## 2016-07-10 DIAGNOSIS — G4730 Sleep apnea, unspecified: Secondary | ICD-10-CM | POA: Diagnosis present

## 2016-07-10 DIAGNOSIS — R079 Chest pain, unspecified: Secondary | ICD-10-CM | POA: Diagnosis not present

## 2016-07-10 DIAGNOSIS — I5023 Acute on chronic systolic (congestive) heart failure: Secondary | ICD-10-CM | POA: Diagnosis present

## 2016-07-10 DIAGNOSIS — I480 Paroxysmal atrial fibrillation: Secondary | ICD-10-CM | POA: Diagnosis not present

## 2016-07-10 DIAGNOSIS — Z87891 Personal history of nicotine dependence: Secondary | ICD-10-CM

## 2016-07-10 DIAGNOSIS — H409 Unspecified glaucoma: Secondary | ICD-10-CM | POA: Diagnosis not present

## 2016-07-10 DIAGNOSIS — I255 Ischemic cardiomyopathy: Secondary | ICD-10-CM | POA: Diagnosis present

## 2016-07-10 DIAGNOSIS — I2510 Atherosclerotic heart disease of native coronary artery without angina pectoris: Secondary | ICD-10-CM | POA: Diagnosis not present

## 2016-07-10 DIAGNOSIS — E876 Hypokalemia: Secondary | ICD-10-CM | POA: Diagnosis not present

## 2016-07-10 DIAGNOSIS — I2089 Other forms of angina pectoris: Secondary | ICD-10-CM

## 2016-07-10 DIAGNOSIS — I498 Other specified cardiac arrhythmias: Secondary | ICD-10-CM | POA: Diagnosis present

## 2016-07-10 DIAGNOSIS — E1165 Type 2 diabetes mellitus with hyperglycemia: Secondary | ICD-10-CM | POA: Diagnosis present

## 2016-07-10 DIAGNOSIS — J9811 Atelectasis: Secondary | ICD-10-CM | POA: Diagnosis not present

## 2016-07-10 DIAGNOSIS — I25118 Atherosclerotic heart disease of native coronary artery with other forms of angina pectoris: Secondary | ICD-10-CM

## 2016-07-10 DIAGNOSIS — I4892 Unspecified atrial flutter: Secondary | ICD-10-CM | POA: Insufficient documentation

## 2016-07-10 DIAGNOSIS — H349 Unspecified retinal vascular occlusion: Secondary | ICD-10-CM | POA: Diagnosis not present

## 2016-07-10 DIAGNOSIS — I499 Cardiac arrhythmia, unspecified: Secondary | ICD-10-CM

## 2016-07-10 DIAGNOSIS — Z7982 Long term (current) use of aspirin: Secondary | ICD-10-CM | POA: Insufficient documentation

## 2016-07-10 DIAGNOSIS — E119 Type 2 diabetes mellitus without complications: Secondary | ICD-10-CM | POA: Diagnosis not present

## 2016-07-10 DIAGNOSIS — I200 Unstable angina: Secondary | ICD-10-CM | POA: Diagnosis present

## 2016-07-10 DIAGNOSIS — I447 Left bundle-branch block, unspecified: Secondary | ICD-10-CM | POA: Diagnosis present

## 2016-07-10 DIAGNOSIS — M1990 Unspecified osteoarthritis, unspecified site: Secondary | ICD-10-CM | POA: Insufficient documentation

## 2016-07-10 DIAGNOSIS — I2589 Other forms of chronic ischemic heart disease: Secondary | ICD-10-CM | POA: Diagnosis not present

## 2016-07-10 DIAGNOSIS — J45909 Unspecified asthma, uncomplicated: Secondary | ICD-10-CM | POA: Diagnosis not present

## 2016-07-10 DIAGNOSIS — Z7984 Long term (current) use of oral hypoglycemic drugs: Secondary | ICD-10-CM | POA: Insufficient documentation

## 2016-07-10 DIAGNOSIS — Z951 Presence of aortocoronary bypass graft: Secondary | ICD-10-CM

## 2016-07-10 DIAGNOSIS — E785 Hyperlipidemia, unspecified: Secondary | ICD-10-CM | POA: Diagnosis present

## 2016-07-10 DIAGNOSIS — Z01810 Encounter for preprocedural cardiovascular examination: Secondary | ICD-10-CM | POA: Diagnosis not present

## 2016-07-10 DIAGNOSIS — R05 Cough: Secondary | ICD-10-CM | POA: Diagnosis not present

## 2016-07-10 DIAGNOSIS — Z981 Arthrodesis status: Secondary | ICD-10-CM

## 2016-07-10 DIAGNOSIS — I471 Supraventricular tachycardia: Secondary | ICD-10-CM | POA: Diagnosis not present

## 2016-07-10 DIAGNOSIS — Z79899 Other long term (current) drug therapy: Secondary | ICD-10-CM | POA: Insufficient documentation

## 2016-07-10 DIAGNOSIS — I10 Essential (primary) hypertension: Secondary | ICD-10-CM | POA: Diagnosis not present

## 2016-07-10 DIAGNOSIS — Z833 Family history of diabetes mellitus: Secondary | ICD-10-CM

## 2016-07-10 DIAGNOSIS — J90 Pleural effusion, not elsewhere classified: Secondary | ICD-10-CM | POA: Diagnosis not present

## 2016-07-10 DIAGNOSIS — I208 Other forms of angina pectoris: Secondary | ICD-10-CM

## 2016-07-10 DIAGNOSIS — E871 Hypo-osmolality and hyponatremia: Secondary | ICD-10-CM | POA: Diagnosis not present

## 2016-07-10 DIAGNOSIS — M109 Gout, unspecified: Secondary | ICD-10-CM | POA: Diagnosis not present

## 2016-07-10 DIAGNOSIS — I209 Angina pectoris, unspecified: Secondary | ICD-10-CM

## 2016-07-10 DIAGNOSIS — R262 Difficulty in walking, not elsewhere classified: Secondary | ICD-10-CM

## 2016-07-10 HISTORY — DX: Sleep apnea, unspecified: G47.30

## 2016-07-10 HISTORY — PX: CARDIAC CATHETERIZATION: SHX172

## 2016-07-10 LAB — BASIC METABOLIC PANEL
ANION GAP: 7 (ref 5–15)
BUN: 11 mg/dL (ref 6–20)
CO2: 27 mmol/L (ref 22–32)
Calcium: 9.2 mg/dL (ref 8.9–10.3)
Chloride: 103 mmol/L (ref 101–111)
Creatinine, Ser: 1.04 mg/dL (ref 0.61–1.24)
GFR calc Af Amer: >60 mL/min (ref >60)
GFR calc non Af Amer: >60 mL/min (ref >60)
GLUCOSE: 284 mg/dL — AB (ref 65–99)
POTASSIUM: 3.9 mmol/L (ref 3.5–5.1)
Sodium: 137 mmol/L (ref 135–145)

## 2016-07-10 LAB — URINALYSIS, ROUTINE W REFLEX MICROSCOPIC
Bilirubin Urine: NEGATIVE
Glucose, UA: 250 mg/dL — AB
Hgb urine dipstick: NEGATIVE
Ketones, ur: NEGATIVE mg/dL
Leukocytes, UA: NEGATIVE
Nitrite: NEGATIVE
Protein, ur: NEGATIVE mg/dL
Specific Gravity, Urine: 1.011 (ref 1.005–1.030)
pH: 6.5 (ref 5.0–8.0)

## 2016-07-10 LAB — SURGICAL PCR SCREEN
MRSA, PCR: NEGATIVE
Staphylococcus aureus: NEGATIVE

## 2016-07-10 LAB — CBC
HCT: 37.7 % — ABNORMAL LOW (ref 39.0–52.0)
Hemoglobin: 12.1 g/dL — ABNORMAL LOW (ref 13.0–17.0)
MCH: 29.5 pg (ref 26.0–34.0)
MCHC: 32.1 g/dL (ref 30.0–36.0)
MCV: 92 fL (ref 78.0–100.0)
Platelets: 103 10*3/uL — ABNORMAL LOW (ref 150–400)
RBC: 4.1 MIL/uL — ABNORMAL LOW (ref 4.22–5.81)
RDW: 14.1 % (ref 11.5–15.5)
WBC: 6.2 10*3/uL (ref 4.0–10.5)

## 2016-07-10 LAB — PROTIME-INR
INR: 1.23
Prothrombin Time: 15.6 seconds — ABNORMAL HIGH (ref 11.4–15.2)

## 2016-07-10 LAB — GLUCOSE, CAPILLARY
Glucose-Capillary: 163 mg/dL — ABNORMAL HIGH (ref 65–99)
Glucose-Capillary: 288 mg/dL — ABNORMAL HIGH (ref 65–99)
Glucose-Capillary: 219 mg/dL — ABNORMAL HIGH (ref 65–99)

## 2016-07-10 LAB — APTT: aPTT: 28 s (ref 24–36)

## 2016-07-10 SURGERY — LEFT HEART CATH AND CORONARY ANGIOGRAPHY
Anesthesia: Moderate Sedation | Laterality: Left

## 2016-07-10 MED ORDER — HEPARIN (PORCINE) IN NACL 2-0.9 UNIT/ML-% IJ SOLN
INTRAMUSCULAR | Status: AC
  Filled 2016-07-10: qty 500

## 2016-07-10 MED ORDER — NEBIVOLOL HCL 10 MG PO TABS
10.0000 mg | ORAL_TABLET | Freq: Every day | ORAL | Status: DC
  Administered 2016-07-10 – 2016-07-13 (×4): 10 mg via ORAL
  Filled 2016-07-10 (×4): qty 2

## 2016-07-10 MED ORDER — SODIUM CHLORIDE 0.9% FLUSH: 3.0000 mL | INTRAVENOUS | Status: DC | PRN

## 2016-07-10 MED ORDER — SODIUM CHLORIDE 0.9% FLUSH: 3.0000 mL | Freq: Two times a day (BID) | INTRAVENOUS | Status: DC

## 2016-07-10 MED ORDER — FENOFIBRATE 160 MG PO TABS
160.0000 mg | ORAL_TABLET | Freq: Every day | ORAL | Status: DC
  Administered 2016-07-10 – 2016-07-13 (×4): 160 mg via ORAL
  Filled 2016-07-10 (×4): qty 1

## 2016-07-10 MED ORDER — TAMSULOSIN HCL 0.4 MG PO CAPS
0.4000 mg | ORAL_CAPSULE | Freq: Every day | ORAL | Status: DC
  Administered 2016-07-10 – 2016-07-11 (×2): 0.4 mg via ORAL
  Filled 2016-07-10 (×2): qty 1

## 2016-07-10 MED ORDER — ASPIRIN 81 MG PO CHEW
CHEWABLE_TABLET | ORAL | Status: AC
  Filled 2016-07-10: qty 1

## 2016-07-10 MED ORDER — FUROSEMIDE 10 MG/ML IJ SOLN
20.0000 mg | Freq: Once | INTRAMUSCULAR | Status: AC
  Administered 2016-07-10: 20 mg via INTRAVENOUS
  Filled 2016-07-10: qty 2

## 2016-07-10 MED ORDER — INSULIN ASPART 100 UNIT/ML ~~LOC~~ SOLN
0.0000 [IU] | Freq: Three times a day (TID) | SUBCUTANEOUS | Status: DC
  Administered 2016-07-11: 3 [IU] via SUBCUTANEOUS
  Administered 2016-07-11: 8 [IU] via SUBCUTANEOUS
  Administered 2016-07-11: 3 [IU] via SUBCUTANEOUS
  Administered 2016-07-12: 5 [IU] via SUBCUTANEOUS
  Administered 2016-07-12 (×2): 3 [IU] via SUBCUTANEOUS
  Administered 2016-07-13: 2 [IU] via SUBCUTANEOUS
  Administered 2016-07-13 (×2): 3 [IU] via SUBCUTANEOUS

## 2016-07-10 MED ORDER — INSULIN GLARGINE 100 UNIT/ML ~~LOC~~ SOLN
12.0000 [IU] | Freq: Two times a day (BID) | SUBCUTANEOUS | Status: DC
  Administered 2016-07-10 – 2016-07-11 (×2): 12 [IU] via SUBCUTANEOUS
  Filled 2016-07-10 (×3): qty 0.12

## 2016-07-10 MED ORDER — ROSUVASTATIN CALCIUM 10 MG PO TABS
10.0000 mg | ORAL_TABLET | Freq: Every day | ORAL | Status: DC
  Administered 2016-07-10 – 2016-07-11 (×2): 10 mg via ORAL
  Filled 2016-07-10 (×3): qty 1

## 2016-07-10 MED ORDER — ASPIRIN EC 81 MG PO TBEC
81.0000 mg | DELAYED_RELEASE_TABLET | Freq: Every day | ORAL | Status: DC
  Administered 2016-07-11 – 2016-07-13 (×3): 81 mg via ORAL
  Filled 2016-07-10 (×4): qty 1

## 2016-07-10 MED ORDER — VERAPAMIL HCL 2.5 MG/ML IV SOLN
INTRAVENOUS | Status: DC | PRN
  Administered 2016-07-10: 2.5 mg via INTRAVENOUS

## 2016-07-10 MED ORDER — MIDAZOLAM HCL 2 MG/2ML IJ SOLN
INTRAMUSCULAR | Status: AC
  Filled 2016-07-10: qty 2

## 2016-07-10 MED ORDER — HEPARIN SODIUM (PORCINE) 1000 UNIT/ML IJ SOLN
INTRAMUSCULAR | Status: DC | PRN
  Administered 2016-07-10: 5000 [IU] via INTRAVENOUS

## 2016-07-10 MED ORDER — FENTANYL CITRATE (PF) 100 MCG/2ML IJ SOLN
INTRAMUSCULAR | Status: AC
  Filled 2016-07-10: qty 2

## 2016-07-10 MED ORDER — SODIUM CHLORIDE 0.9 % IV SOLN: 250.0000 mL | INTRAVENOUS | Status: DC | PRN

## 2016-07-10 MED ORDER — ASPIRIN 81 MG PO CHEW
81.0000 mg | CHEWABLE_TABLET | ORAL | Status: AC
  Administered 2016-07-10: 81 mg via ORAL

## 2016-07-10 MED ORDER — VERAPAMIL HCL 2.5 MG/ML IV SOLN
INTRAVENOUS | Status: AC
  Filled 2016-07-10: qty 2

## 2016-07-10 MED ORDER — HEPARIN SODIUM (PORCINE) 1000 UNIT/ML IJ SOLN
INTRAMUSCULAR | Status: AC
  Filled 2016-07-10: qty 1

## 2016-07-10 MED ORDER — FENTANYL CITRATE (PF) 100 MCG/2ML IJ SOLN
INTRAMUSCULAR | Status: DC | PRN
  Administered 2016-07-10: 25 ug via INTRAVENOUS

## 2016-07-10 MED ORDER — INSULIN ASPART 100 UNIT/ML ~~LOC~~ SOLN: 0.0000 [IU] | Freq: Every day | SUBCUTANEOUS | Status: DC

## 2016-07-10 MED ORDER — MIDAZOLAM HCL 2 MG/2ML IJ SOLN
INTRAMUSCULAR | Status: DC | PRN
  Administered 2016-07-10: 1 mg via INTRAVENOUS

## 2016-07-10 MED ORDER — SODIUM CHLORIDE 0.9 % IV SOLN
INTRAVENOUS | Status: DC
Start: 2016-07-10 — End: 2016-07-10
  Administered 2016-07-10: 09:00:00 via INTRAVENOUS

## 2016-07-10 MED ORDER — IOPAMIDOL (ISOVUE-300) INJECTION 61%
INTRAVENOUS | Status: DC | PRN
  Administered 2016-07-10: 85 mL via INTRA_ARTERIAL

## 2016-07-10 MED ORDER — HEPARIN (PORCINE) IN NACL 100-0.45 UNIT/ML-% IJ SOLN
1650.0000 [IU]/h | INTRAMUSCULAR | Status: DC
  Administered 2016-07-10: 1250 [IU]/h via INTRAVENOUS
  Administered 2016-07-12: 1650 [IU]/h via INTRAVENOUS
  Filled 2016-07-10 (×4): qty 250

## 2016-07-10 SURGICAL SUPPLY — 7 items
CATH 5FR PIGTAIL DIAGNOSTIC (CATHETERS) ×3 IMPLANT
CATH OPTITORQUE JACKY 4.0 5F (CATHETERS) ×3 IMPLANT
DEVICE RAD TR BAND REGULAR (VASCULAR PRODUCTS) ×3 IMPLANT
GLIDESHEATH SLEND SS 6F .021 (SHEATH) ×3 IMPLANT
KIT MANI 3VAL PERCEP (MISCELLANEOUS) ×3 IMPLANT
PACK CARDIAC CATH (CUSTOM PROCEDURE TRAY) ×3 IMPLANT
WIRE SAFE-T 1.5MM-J .035X260CM (WIRE) ×3 IMPLANT

## 2016-07-10 NOTE — Progress Notes (Signed)
Report called to Janett Billow RN at [facility]. Patient en route to [facility] at this time.

## 2016-07-10 NOTE — Progress Notes (Signed)
Patient alert and oriented. Reporting no pain. VSS. Right radial WNL, gauze and tegaderm applied. Report given to Garrison, Hannah. Awaiting callback to give report to RN at [facility] accepting patient. EMS present to transport patient.

## 2016-07-10 NOTE — Interval H&P Note (Signed)
Cath Lab Visit (complete for each Cath Lab visit)  Clinical Evaluation Leading to the Procedure:   ACS: No.  Non-ACS:    Anginal Classification: CCS III  Anti-ischemic medical therapy: Minimal Therapy (1 class of medications)  Non-Invasive Test Results: Intermediate-risk stress test findings: cardiac mortality 1-3%/year  Prior CABG: No previous CABG      History and Physical Interval Note:  07/10/2016 9:30 AM  Tommy Sullivan  has presented today for surgery, with the diagnosis of LT cath      Shortness of breath   Abnormal Myoview  The various methods of treatment have been discussed with the patient and family. After consideration of risks, benefits and other options for treatment, the patient has consented to  Procedure(s): Left Heart Cath and Coronary Angiography (Left) as a surgical intervention .  The patient's history has been reviewed, patient examined, no change in status, stable for surgery.  I have reviewed the patient's chart and labs.  Questions were answered to the patient's satisfaction.     Kathlyn Sacramento

## 2016-07-10 NOTE — H&P (View-Only) (Signed)
Cardiology Office Note   Date:  06/12/2016   ID:  Tommy Sullivan, DOB 07/07/1946, MRN [id number]  PCP:  Eliezer Lofts, MD  Cardiologist:   Kathlyn Sacramento, MD   Chief Complaint  Patient presents with  . Other    6 month follow up. Meds reviewed by the pt. verbally. Pt. c/o vertigo and shortness of breath.       History of Present Illness: Tommy Sullivan is a 70 y.o. male who presents for a follow-up visit regarding coronary artery disease and atrial flutter status post ablation.  He has known history of coronary artery disease with previous myocardial infarction  in 2009 with PCI done by Dr. Collene Mares and no cardiac events since then.  He has other chronic medical conditions that include type 2 diabetes, hypertension and hyperlipidemia.  Most recent nuclear stress test showed evidence of prior inferior and inferolateral infarct without significant ischemia. Ejection fraction was 47%.  He underwent atrial flutter ablation in August 2015 by Dr. Caryl Comes. Echocardiogram in October 2015 showed an ejection fraction of 55-60%, mildly dilated left atrium and no evidence of pulmonary hypertension.  Since last visit, he reports significant worsening of exertional dyspnea and also reports increased orthostatic dizziness with an episode of presyncope yesterday when he was carrying a heavy box. He thought that the shortness of breath was related to increased dose of metformin. He denies any chest pain or discomfort. There is no orthopnea, PND or leg edema. He does have sleep apnea but uses a CPAP on a regular basis.   Past Medical History:  Diagnosis Date  . Allergy   . Arthritis   . Atrial flutter ([place])   . Coronary atherosclerosis of unspecified type of vessel, native or graft   . Diabetes mellitus without complication ([place])   . Extrinsic asthma, unspecified   . Glaucoma   . Gout   . Hyperlipidemia   . Hypertension   . Myocardial infarction [facility]) 1/09   PCI done by Dr. Collene Mares in [place]    . Retinal vascular occlusion, unspecified    Right    Past Surgical History:  Procedure Laterality Date  . ABLATION  05-07-2014   EPS and ablation of atrial flutter by Dr Caryl Comes  . APPENDECTOMY  1954  . ATRIAL FLUTTER ABLATION N/A 05/07/2014   Procedure: ATRIAL FLUTTER ABLATION;  Surgeon: Deboraha Sprang, MD;  Location: [facility] CATH LAB;  Service: Cardiovascular;  Laterality: N/A;  . COLONOSCOPY    . CORONARY ANGIOPLASTY  2009   stent [place]  . NECK SURGERY  1990's   x2, Fusion  . SHOULDER ARTHROSCOPY WITH SUBACROMIAL DECOMPRESSION Left 04/02/2014   Procedure: SHOULDER ARTHROSCOPY WITH SUBACROMIAL DECOMPRESSION WITH ROTATOR CUFF REPAIR;  Surgeon: Nita Sells, MD;  Location: [facility];  Service: Orthopedics;  Laterality: Left;  Left shoulder rotator cuff repair, subacromial decompression  . SKIN SURGERY    . TONSILLECTOMY       Current Outpatient Prescriptions  Medication Sig Dispense Refill  . aspirin EC 81 MG tablet Take 1 tablet (81 mg total) by mouth daily. 90 tablet 3  . Blood Glucose Monitoring Suppl (BAYER CONTOUR NEXT MONITOR) W/DEVICE KIT 1 each by Does not apply route. Use to check blood sugar 2 times daily.  Dx: X91.4    . BYSTOLIC 10 MG tablet TAKE 1 TABLET BY MOUTH DAILY 90 tablet 1  . Cholecalciferol (VITAMIN D3) 1000 units CAPS Take 1 capsule by mouth daily.    Marland Kitchen  fenofibrate 160 MG tablet TAKE 1 TABLET BY MOUTH DAILY 90 tablet 1  . glucose blood (BAYER CONTOUR NEXT TEST) test strip Use to check blood sugar two times daily.  Dx: E11.9 100 each 12  . latanoprost (XALATAN) 0.005 % ophthalmic solution Place 1 drop into the left eye at bedtime.   3  . losartan (COZAAR) 100 MG tablet TAKE ONE TABLET BY MOUTH DAILY 90 tablet 1  . metFORMIN (GLUMETZA) 500 MG (MOD) 24 hr tablet Take 500 mg by mouth daily with breakfast.    . modafinil (PROVIGIL) 200 MG tablet Take 1 tablet (200 mg total) by mouth daily. Once daily 90 tablet 3  . Omega-3 Fatty Acids  (FISH OIL) 1000 MG CAPS Take 2 capsules by mouth 2 (two) times daily.     . rosuvastatin (CRESTOR) 10 MG tablet TAKE 1 TABLET BY MOUTH DAILY. 90 tablet 1  . tamsulosin (FLOMAX) 0.4 MG CAPS capsule TAKE 1 CAPSULE BY MOUTH DAILY 90 capsule 1   No current facility-administered medications for this visit.     Allergies:   Review of patient's allergies indicates no known allergies.    Social History:  The patient  reports that he quit smoking about 27 years ago. His smoking use included Cigarettes. He has a 20.00 pack-year smoking history. He has never used smokeless tobacco. He reports that he drinks about 1.8 oz of alcohol per week . He reports that he does not use drugs.   Family History:  The patient's family history includes AAA (abdominal aortic aneurysm) in his father; Coronary artery disease in his brother; Diabetes in his brother, mother, and sister; Heart disease in his mother.    ROS:  Please see the history of present illness.   Otherwise, review of systems are positive for none.   All other systems are reviewed and negative.    PHYSICAL EXAM: VS:  BP 100/76 (BP Location: Left Arm, Patient Position: Sitting, Cuff Size: Normal)   Pulse 69   Ht 5' 9.5" (1.765 m)   Wt 234 lb 8 oz (106.4 kg)   BMI 34.13 kg/m  , BMI Body mass index is 34.13 kg/m. GEN: Well nourished, well developed, in no acute distress  HEENT: normal  Neck: no JVD, carotid bruits, or masses Cardiac: RRR; no murmurs, rubs, or gallops,no edema  Respiratory:  clear to auscultation bilaterally, normal work of breathing GI: soft, nontender, nondistended, + BS MS: no deformity or atrophy  Skin: warm and dry, no rash Neuro:  Strength and sensation are intact Psych: euthymic mood, full affect   EKG:  EKG is ordered today. The ekg ordered today demonstrates : Normal sinus rhythm with old anteroseptal infarct and old inferior infarct. QRS widening. Left bundle branch block morphology.   Recent Labs: 02/24/2016: ALT  23; BUN 17; Creatinine, Ser 1.16; Potassium 4.0; Sodium 138    Lipid Panel    Component Value Date/Time   CHOL 108 02/24/2016 0938   TRIG 172.0 (H) 02/24/2016 0938   HDL 24.70 (L) 02/24/2016 0938   CHOLHDL 4 02/24/2016 0938   VLDL 34.4 02/24/2016 0938   LDLCALC 49 02/24/2016 0938   LDLDIRECT 71.0 11/24/2015 0831      Wt Readings from Last 3 Encounters:  06/12/16 234 lb 8 oz (106.4 kg)  05/05/16 236 lb 8 oz (107.3 kg)  04/27/16 232 lb 12 oz (105.6 kg)      No flowsheet data found.    ASSESSMENT AND PLAN:  1.  Coronary artery disease involving  native coronary arteries with other forms of angina: The patient reports significant worsening of exertional dyspnea interfering with activities of daily living with no chest pain. He has known history of coronary artery disease. I recommend evaluation with a pharmacologic nuclear stress test. Given that his EKG shows a left bundle branch block morphology, a treadmill stress test is sufficient. Given that his symptoms are predominantly shortness of breath, I also requested an echocardiogram to evaluate his pulmonary pressure.  2. History of atrial flutter status post RF ablation in 2015: No evidence of recurrent arrhythmia.  3. Essential hypertension: His blood pressure continues to be somewhat on the low side and he continues to have orthostatic dizziness. Thus, I decreased the dose of losartan to 50 mg daily.  4. Hyperlipidemia: Most recent lipid profile she is showed significant improvement in triglyceride 172. His LDL was 49. Continue rosuvastatin and fenofibrate.  6. Diabetes mellitus: Managed by his primary care physician with some improvement and hemoglobin A1c recently.   Disposition:   FU with me in 6 months  Signed,  Kathlyn Sacramento, MD  06/12/2016 1:48 PM    [place]

## 2016-07-10 NOTE — H&P (Signed)
History and Physical  Patient ID: Tommy Sullivan MRN: [id number] DOB/AGE: 1945/10/10 70 y.o. Admit date: 07/10/2016  Primary Care Physician: Eliezer Lofts, MD Primary Cardiologist : Dr. Fletcher Anon.   HPI:  Tommy Sullivan is a 70 y.o. male who presented for cardiac catheterization due to severe exertional dyspnea, abnormal stress test and known history of coronary artery disease.    He has known history of coronary artery disease with previous myocardial infarction  in 2009 with RCA PCI done by Dr. Collene Mares and no cardiac events since then.  He has other chronic medical conditions that include type 2 diabetes, hypertension and hyperlipidemia.   He underwent atrial flutter ablation in August 2015 by Dr. Caryl Comes. He has underlying left bundle branch block. Echocardiogram in October 2015 showed an ejection fraction of 55-60%, mildly dilated left atrium and no evidence of pulmonary hypertension. He does have sleep apnea but uses a CPAP on a regular basis. He was seen recently in the office and reported significant worsening of exertional dyspnea. He underwent a nuclear stress test which showed evidence of prior inferior and inferolateral infarct with ejection fraction of 34%. The patient underwent cardiac catheterization today via the right radial artery which showed severe diffuse three-vessel coronary artery disease with diabetic pattern, mildly reduced LV systolic function with an ejection fraction of 45-50% and moderately elevated left ventricular end-diastolic pressure at 28 mmHg.  After discussion with the patient, he reported that his severe dyspnea is now happening with any kind of activities and has worsened since he actually last saw me in the office. Due to that and the cardiac cath finding, I have recommended inpatient admission and evaluation for CABG. The patient's symptoms are consistent with unstable angina.   A 10 point review of system was performed. It is negative other than that  mentioned in the history of present illness.   Past Medical History:  Diagnosis Date  . Allergy   . Arthritis   . Atrial flutter ([place])   . Coronary atherosclerosis of unspecified type of vessel, native or graft   . Diabetes mellitus without complication ([place])   . Extrinsic asthma, unspecified   . Glaucoma   . Gout   . Hyperlipidemia   . Hypertension   . Myocardial infarction 1/09   PCI done by Dr. Collene Mares in [place]  . Retinal vascular occlusion, unspecified    Right  . Sleep apnea     Family History  Problem Relation Age of Onset  . Diabetes Sister   . Coronary artery disease Brother   . AAA (abdominal aortic aneurysm) Father   . Heart disease Mother   . Diabetes Mother   . Diabetes Brother   . Colon cancer Neg Hx     Social History   Social History  . Marital status: Married    Spouse name: N/A  . Number of children: N/A  . Years of education: N/A   Occupational History  . retired Retired   Social History Main Topics  . Smoking status: Former Smoker    Packs/day: 1.00    Years: 20.00    Types: Cigarettes    Quit date: 10/02/1988  . Smokeless tobacco: Never Used     Comment: quit in the 1990's  . Alcohol use 1.8 oz/week    3 Cans of beer per week     Comment: 3-4 drinks per week.   . Drug use: No  . Sexual activity: Not on file   Other Topics Concern  .  Not on file   Social History Narrative  . No narrative on file    Past Surgical History:  Procedure Laterality Date  . ABLATION  05-07-2014   EPS and ablation of atrial flutter by Dr Caryl Comes  . APPENDECTOMY  1954  . ATRIAL FLUTTER ABLATION N/A 05/07/2014   Procedure: ATRIAL FLUTTER ABLATION;  Surgeon: Deboraha Sprang, MD;  Location: [facility] CATH LAB;  Service: Cardiovascular;  Laterality: N/A;  . CARDIAC CATHETERIZATION Left 07/10/2016   Procedure: Left Heart Cath and Coronary Angiography;  Surgeon: Wellington Hampshire, MD;  Location: [facility] CV LAB;  Service: Cardiovascular;  Laterality: Left;  . COLONOSCOPY     . CORONARY ANGIOPLASTY  2009   stent [place]  . NECK SURGERY  1990's   x2, Fusion  . SHOULDER ARTHROSCOPY WITH SUBACROMIAL DECOMPRESSION Left 04/02/2014   Procedure: SHOULDER ARTHROSCOPY WITH SUBACROMIAL DECOMPRESSION WITH ROTATOR CUFF REPAIR;  Surgeon: Nita Sells, MD;  Location: [facility];  Service: Orthopedics;  Laterality: Left;  Left shoulder rotator cuff repair, subacromial decompression  . SKIN SURGERY    . TONSILLECTOMY       Prescriptions Prior to Admission  Medication Sig Dispense Refill Last Dose  . aspirin EC 81 MG tablet Take 1 tablet (81 mg total) by mouth daily. 90 tablet 3 07/09/2016 at Unknown time  . BYSTOLIC 10 MG tablet TAKE 1 TABLET BY MOUTH DAILY (Patient taking differently: Take 10 mg by mouth daily) 90 tablet 1 07/09/2016 at Unknown time  . Cholecalciferol (VITAMIN D3) 1000 units CAPS Take 1,000 Units by mouth daily.    07/09/2016 at Unknown time  . fenofibrate 160 MG tablet TAKE 1 TABLET BY MOUTH DAILY (Patient taking differently: Take 160 mg by mouth daily) 90 tablet 1 07/09/2016 at Unknown time  . latanoprost (XALATAN) 0.005 % ophthalmic solution Place 1 drop into the left eye at bedtime.   3 07/09/2016 at Unknown time  . losartan (COZAAR) 50 MG tablet Take 1 tablet (50 mg total) by mouth daily. 90 tablet 2 07/09/2016 at Unknown time  . modafinil (PROVIGIL) 200 MG tablet Take 1 tablet (200 mg total) by mouth daily. Once daily (Patient taking differently: Take 200 mg by mouth daily. ) 90 tablet 3 07/09/2016 at Unknown time  . Omega-3 Fatty Acids (FISH OIL) 1000 MG CAPS Take 2 mg by mouth 2 (two) times daily.    07/09/2016 at Unknown time  . rosuvastatin (CRESTOR) 10 MG tablet TAKE 1 TABLET BY MOUTH DAILY. (Patient taking differently: Take 10 mg by mouth daily) 90 tablet 1 07/09/2016 at Unknown time  . tamsulosin (FLOMAX) 0.4 MG CAPS capsule TAKE 1 CAPSULE BY MOUTH DAILY (Patient taking differently: Take 0.4 mg by mouth in the morning) 90  capsule 1 07/09/2016 at Unknown time  . glucose blood (BAYER CONTOUR NEXT TEST) test strip Use to check blood sugar two times daily.  Dx: E11.9 (Patient not taking: Reported on 07/10/2016) 100 each 12 Not Taking at Unknown time    Physical Exam: Blood pressure 111/70, pulse 63, temperature 98.4 F (36.9 C), temperature source Oral, resp. rate 20, height 5' 9.5" (1.765 m), weight 240 lb 9.6 oz (109.1 kg), SpO2 99 %.   Constitutional:  oriented to person, place, and time. He appears well-developed and well-nourished. No distress.  HENT: No nasal discharge.  Head: Normocephalic and atraumatic.  Eyes: Pupils are equal and round.  No discharge. Neck: Normal range of motion. Neck supple. No JVD present. No thyromegaly present.  Cardiovascular: Normal rate, regular rhythm, normal heart sounds. Exam reveals no gallop and no friction rub. No murmur heard.  Pulmonary/Chest: Effort normal and breath sounds normal. No stridor. No respiratory distress.  no wheezes or rales.   Abdominal: Soft. Bowel sounds are normal. He exhibits no distension. There is no tenderness. There is no rebound and no guarding.  Musculoskeletal: Normal range of motion. No edema and no tenderness.  Neurological: Alert and oriented to person, place, and time. Coordination normal.  Skin: Skin is warm and dry. No rash noted. He is not diaphoretic. No erythema. No pallor.  Psychiatric: Normal mood and affect.  behavior is normal. Judgment and thought content normal.    Labs:   Lab Results  Component Value Date   WBC 6.2 07/05/2016   HGB 12.8 (L) 05/05/2014   HCT 39.4 07/05/2016   MCV 90 07/05/2016   PLT 118 (L) 07/05/2016    Recent Labs Lab 07/05/16 1001  NA 137  K 4.2  CL 98  CO2 25  BUN 18  CREATININE 1.11  CALCIUM 9.8  GLUCOSE 182*   No results found for: CKTOTAL, CKMB, CKMBINDEX, TROPONINI    Radiology: Nm Myocar Multi W/spect W/wall Motion / Ef  Result Date: 06/29/2016 Pharmacological myocardial perfusion  imaging study with no significant ischemia Large region of fixed perfusion defect in the inferior and inferolateral wall consistent with previous MI, though significant GI uptake artifact noted. Improved perfusion noted on attenuation corrected images though still with moderate fixed perfusion defect noted inferior and inferolateral wall. Lateral wall motion abnormality noted, EF estimated at 34% No EKG changes concerning for ischemia at peak stress or in recovery. Moderate risk scan Signed, Esmond Plants, MD, Ph.D [facility]    EKG: Independently reviewed by me and showed normal sinus rhythm with left bundle branch block  ASSESSMENT AND PLAN:   1. Unstable angina: Cardiac catheterization showed severe three-vessel coronary artery disease with mildly reduced LV systolic function. Given his diabetes and diffuse disease, best option is CABG. I consulted CT surgery. An echocardiogram was requested. Start heparin drip until surgery.  2. Acute on chronic systolic heart failure: His LVEDP was moderately elevated. Gentle diuresis with furosemide is recommended.  3. Status post atrial flutter ablation by Dr. Caryl Comes with no evidence of recurrent arrhythmia. The patient has chronic left bundle branch block.  4. Hyperlipidemia: Continue treatment with rosuvastatin.  5. Type 2 diabetes: Hold metformin. Sliding scale coverage.  Signed: Kathlyn Sacramento MD, [facility] 07/10/2016, 4:57 PM

## 2016-07-10 NOTE — Consult Note (Signed)
[address]       [place] [address]             [phone]        Tommy Sullivan [place] Medical Record [id number] Date of Birth: 05/18/1946  Referring Rod Can MD Primary Care: Eliezer Lofts, MD  Chief Complaint:   No chief complaint on file. Patient examined, coronary angiogram personally reviewed and counseled with patient  History of Present Illness:     70 year old diabetic male nonsmoker presents with class III equivalent angina-shortness of breath and decreasing exercise tolerance of fairly rapid progression over the past few weeks. A stress test was abnormal and cardiac catheterization performed today via right radial artery by Dr. Fletcher Anon. The patient has history of CAD and had PCI of the RCA in [place] 8 years ago. About 2 years ago he underwent ablation of right atrial flutter by Dr. Caryl Comes. He has been off Coumadin for several months. No evidence recurrent flutter.. Stress test showed inferior lateral hypokinesia with ejection fraction of 35%. Echocardiogram is pending. Cardiac catheterization demonstrated three-vessel CAD with a diabetic pattern with 90 and 95% stenosis of the LAD, occlusion of the distal circumflex twice, and high-grade stenosis of the PD-PL branches of the distal RCA. LVEDP was 28. Ejection fraction 40-45 percent. Based on his coronary anatomy and symptoms he is felt to be candidate for surgical coronary revascularization. The patient denies resting symptoms. The rapid progression of his exertional symptoms are consistent with unstable angina. Current Activity/ Functional Status: Patient is retired. He is active playing golf doing yard work and traveling with his wife. His wife recently retired from working in radiation oncology as the Teacher, adult education for the [employer]  The patient denies symptoms of TIAs. The patient has had general anesthesia for shoulder reconstruction without anesthetic problems. The patient  takes oral medication for his diabetes, last A1c is reported at 7.4.   Zubrod Score: At the time of surgery this patient's most appropriate activity status/level should be described as: []     0    Normal activity, no symptoms []     1    Restricted in physical strenuous activity but ambulatory, able to do out light work [x]     2    Ambulatory and capable of self care, unable to do work activities, up and about                 more than 50%  Of the time                            []     3    Only limited self care, in bed greater than 50% of waking hours []     4    Completely disabled, no self care, confined to bed or chair []     5    Moribund  Past Medical History:  Diagnosis Date  . Allergy   . Arthritis   . Atrial flutter ([place])   . Coronary atherosclerosis of unspecified type of vessel, native or graft   . Diabetes mellitus without complication ([place])   . Extrinsic asthma, unspecified   . Glaucoma   . Gout   . Hyperlipidemia   . Hypertension   . Myocardial infarction 1/09   PCI done by Dr. Collene Mares in [place]  . Retinal vascular occlusion, unspecified    Right  . Sleep apnea  Past Surgical History:  Procedure Laterality Date  . ABLATION  05-07-2014   EPS and ablation of atrial flutter by Dr Caryl Comes  . APPENDECTOMY  1954  . ATRIAL FLUTTER ABLATION N/A 05/07/2014   Procedure: ATRIAL FLUTTER ABLATION;  Surgeon: Deboraha Sprang, MD;  Location[facility] CATH LAB;  Service: Cardiovascular;  Laterality: N/A;  . CARDIAC CATHETERIZATION Left 07/10/2016   Procedure: Left Heart Cath and Coronary Angiography;  Surgeon: Wellington Hampshire, MD;  Location: [facility] CV LAB;  Service: Cardiovascular;  Laterality: Left;  . COLONOSCOPY    . CORONARY ANGIOPLASTY  2009   stent [place]  . NECK SURGERY  1990's   x2, Fusion  . SHOULDER ARTHROSCOPY WITH SUBACROMIAL DECOMPRESSION Left 04/02/2014   Procedure: SHOULDER ARTHROSCOPY WITH SUBACROMIAL DECOMPRESSION WITH ROTATOR CUFF REPAIR;  Surgeon: Nita Sells, MD;  Location: [facility];  Service: Orthopedics;  Laterality: Left;  Left shoulder rotator cuff repair, subacromial decompression  . SKIN SURGERY    . TONSILLECTOMY      History  Smoking Status  . Former Smoker  . Packs/day: 1.00  . Years: 20.00  . Types: Cigarettes  . Quit date: 10/02/1988  Smokeless Tobacco  . Never Used    Comment: quit in the 1990's    History  Alcohol Use  . 1.8 oz/week  . 3 Cans of beer per week    Comment: 3-4 drinks per week.     Social History   Social History  . Marital status: Married    Spouse name: N/A  . Number of children: N/A  . Years of education: N/A   Occupational History  . retired Retired   Social History Main Topics  . Smoking status: Former Smoker    Packs/day: 1.00    Years: 20.00    Types: Cigarettes    Quit date: 10/02/1988  . Smokeless tobacco: Never Used     Comment: quit in the 1990's  . Alcohol use 1.8 oz/week    3 Cans of beer per week     Comment: 3-4 drinks per week.   . Drug use: No  . Sexual activity: Not on file   Other Topics Concern  . Not on file   Social History Narrative  . No narrative on file    No Known Allergies  Current Facility-Administered Medications  Medication Dose Route Frequency Provider Last Rate Last Dose  . aspirin EC tablet 81 mg  81 mg Oral Daily Brittainy M Simmons, PA-C      . fenofibrate tablet 160 mg  160 mg Oral Daily Consuelo Pandy, PA-C   160 mg at 07/10/16 1625  . nebivolol (BYSTOLIC) tablet 10 mg  10 mg Oral Daily Brittainy Erie Noe, PA-C   10 mg at 07/10/16 1625  . rosuvastatin (CRESTOR) tablet 10 mg  10 mg Oral Daily Brittainy Erie Noe, PA-C   10 mg at 07/10/16 1625  . tamsulosin (FLOMAX) capsule 0.4 mg  0.4 mg Oral QPC supper Brittainy M Simmons, PA-C   0.4 mg at 07/10/16 1626    Prescriptions Prior to Admission  Medication Sig Dispense Refill Last Dose  . aspirin EC 81 MG tablet Take 1 tablet (81 mg total) by mouth daily. 90  tablet 3 07/09/2016 at Unknown time  . BYSTOLIC 10 MG tablet TAKE 1 TABLET BY MOUTH DAILY (Patient taking differently: Take 10 mg by mouth daily) 90 tablet 1 07/09/2016 at Unknown time  . Cholecalciferol (VITAMIN D3) 1000 units CAPS  Take 1,000 Units by mouth daily.    07/09/2016 at Unknown time  . fenofibrate 160 MG tablet TAKE 1 TABLET BY MOUTH DAILY (Patient taking differently: Take 160 mg by mouth daily) 90 tablet 1 07/09/2016 at Unknown time  . latanoprost (XALATAN) 0.005 % ophthalmic solution Place 1 drop into the left eye at bedtime.   3 07/09/2016 at Unknown time  . losartan (COZAAR) 50 MG tablet Take 1 tablet (50 mg total) by mouth daily. 90 tablet 2 07/09/2016 at Unknown time  . modafinil (PROVIGIL) 200 MG tablet Take 1 tablet (200 mg total) by mouth daily. Once daily (Patient taking differently: Take 200 mg by mouth daily. ) 90 tablet 3 07/09/2016 at Unknown time  . Omega-3 Fatty Acids (FISH OIL) 1000 MG CAPS Take 2 mg by mouth 2 (two) times daily.    07/09/2016 at Unknown time  . rosuvastatin (CRESTOR) 10 MG tablet TAKE 1 TABLET BY MOUTH DAILY. (Patient taking differently: Take 10 mg by mouth daily) 90 tablet 1 07/09/2016 at Unknown time  . tamsulosin (FLOMAX) 0.4 MG CAPS capsule TAKE 1 CAPSULE BY MOUTH DAILY (Patient taking differently: Take 0.4 mg by mouth in the morning) 90 capsule 1 07/09/2016 at Unknown time  . glucose blood (BAYER CONTOUR NEXT TEST) test strip Use to check blood sugar two times daily.  Dx: E11.9 (Patient not taking: Reported on 07/10/2016) 100 each 12 Not Taking at Unknown time    Family History  Problem Relation Age of Onset  . Diabetes Sister   . Coronary artery disease Brother   . AAA (abdominal aortic aneurysm) Father   . Heart disease Mother   . Diabetes Mother   . Diabetes Brother   . Colon cancer Neg Hx      Review of Systems:               The patient has gained 6 pounds in the past week.               The patient complains of presyncope when he bends over to  tie his shoes.    Cardiac Review of Systems: Y or N  Chest Pain [ Yes   ]  Resting SOB [   ] Exertional SOB  [yes no  ]  Orthopnea [ no ]   Pedal Edema [  yes ]    Palpitations [ yes ] Syncope  [ no ]   Presyncope [place]   ]  General Review of Systems: [Y] = yes [  ]=no Constitional: recent weight change [ yes ]; anorexia [  ]; fatigu yes e [  ]; nausea [  ]; night sweats [  ]; fever [  ]; or chills [  ]                                                               Dental: poor dentition[  ]; Last Dentist visit: Every 6 months  Eye : blurred vision [  ]; diplopia [   ]; vision changes [ yes blind in right eye 30 years ];  Amaurosis fugax[  ]; Resp: cough [  ];  wheezing[  ];  hemoptysis[  ]; shortness of breath[  ]; paroxysmal nocturnal dyspnea[  ]; dyspnea on exertion[ yes  ]; or  orthopnea[  ];  GI:  gallstones[  ], vomiting[  ];  dysphagia[  ]; melena[  ];  hematochezia [  ]; heartburn[  ];   Hx of  Colonoscopy[  ]; GU: kidney stones [  ]; hematuria[  ];   dysuria [  ];  nocturia[ yes ];  history of     obstruction [  ]; urinary frequency [  yes]             Skin: rash, swelling[  ];, hair loss[  ];  peripheral edema[  ];  or itching[  ]; Musculosketetal: myalgias[  ];  joint swelling[  ];  joint erythema[  ];  joint pain[  ];  back pain[  ];  Heme/Lymph: bruising[  ];  bleeding[  ];  anemia[  ];  Neuro: TIA[  ];  headaches[  ];  stroke[  ];  vertigo[  ];  seizures[  ];   paresthesias[  ];  difficulty walking[  ];  Psych:depression[  ]; anxiety[  ];  Endocrine: diabetes[  yes];  thyroid dysfunction[  ];  Immunizations: Flu [  ]; Pneumococcal[  ];  Other: Left-hand-dominant  Physical Exam: BP 111/70 (BP Location: Right Arm)   Pulse 63   Temp 98.4 F (36.9 C) (Oral)   Resp 20   Ht 5' 9.5" (1.765 m)   Wt 240 lb 9.6 oz (109.1 kg)   SpO2 99%   BMI 35.02 kg/m       Physical Exam  General: Well-developed overweight Caucasian male no acute distress HEENT: Normocephalic left eye with  altered gaze , dentition adequate Neck: Supple without JVD, adenopathy, or bruit Chest: Clear to auscultation, symmetrical breath sounds, no rhonchi, no tenderness             or deformity Cardiovascular: Regular rate and rhythm, no murmur, no gallop, peripheral pulses             palpable in all extremities Abdomen:  Soft, nontender, no palpable mass or organomegaly Extremities: Warm, well-perfused, no clubbing cyanosis edema or tenderness,              no venous stasis changes of the legs Rectal/GU: Deferred Neuro: Grossly non--focal and symmetrical throughout except for right eye blindness Skin: Clean and dry without rash or ulceration    Diagnostic Studies & Laboratory data:     Recent Radiology Findings:   Previous chest x-ray personally reviewed and is clear   I have independently reviewed the above radiologic studies.  Recent Lab Findings: Lab Results  Component Value Date   WBC 6.2 07/10/2016   HGB 12.1 (L) 07/10/2016   HCT 37.7 (L) 07/10/2016   PLT PENDING 07/10/2016   GLUCOSE 284 (H) 07/10/2016   CHOL 108 02/24/2016   TRIG 172.0 (H) 02/24/2016   HDL 24.70 (L) 02/24/2016   LDLDIRECT 71.0 11/24/2015   LDLCALC 49 02/24/2016   ALT 23 02/24/2016   AST 20 02/24/2016   NA 137 07/10/2016   K 3.9 07/10/2016   CL 103 07/10/2016   CREATININE 1.04 07/10/2016   BUN 11 07/10/2016   CO2 27 07/10/2016   INR 1.1 07/05/2016   HGBA1C 7.9 (H) 04/26/2016      Assessment / Plan:     Poorly controlled diabetes with admission blood sugar 284   Severe three-vessel coronary disease with unstable angina    Moderate LV dysfunction ejection fraction 40-45 percent with LVEDP 28     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 Adequate targets appeared to be LAD, PDA, PL, diagonal and possible distal circumflex. The procedure CABG has been discussed with the patient and he agrees with the plan for surgery at this  hospitalization.   @ME1 @ 07/10/2016 5:24 PM

## 2016-07-10 NOTE — Progress Notes (Signed)
Set up CPAP on auto and the Pt said he would put his CPAP on when he was ready to go to bed

## 2016-07-10 NOTE — Progress Notes (Signed)
ANTICOAGULATION CONSULT NOTE - Initial Consult  Pharmacy Consult for heparin Indication: chest pain/ACS  No Known Allergies  Patient Measurements: Height: 5' 9.5" (176.5 cm) Weight: 240 lb 9.6 oz (109.1 kg) IBW/kg (Calculated) : 71.85 Heparin Dosing Weight: 95.6kg  Vital Signs: Temp: 98.4 F (36.9 C) (10/09 1322) Temp Source: Oral (10/09 1322) BP: 111/70 (10/09 1322) Pulse Rate: 63 (10/09 1322)  Labs: No results for input(s): HGB, HCT, PLT, APTT, LABPROT, INR, HEPARINUNFRC, HEPRLOWMOCWT, CREATININE, CKTOTAL, CKMB, TROPONINI in the last 72 hours.  Estimated Creatinine Clearance: 76 mL/min (by C-G formula based on SCr of 1.11 mg/dL).   Medical History: Past Medical History:  Diagnosis Date  . Allergy   . Arthritis   . Atrial flutter ([place])   . Coronary atherosclerosis of unspecified type of vessel, native or graft   . Diabetes mellitus without complication ([place])   . Extrinsic asthma, unspecified   . Glaucoma   . Gout   . Hyperlipidemia   . Hypertension   . Myocardial infarction 1/09   PCI done by Dr. Collene Mares in [place]  . Retinal vascular occlusion, unspecified    Right  . Sleep apnea     Medications:  See EMR  Assessment:  70 YOM to start heparin S/P cath with plan for CABG. No PTA anti-coag. Received heparin 5000 unit bolus at ~1000 today without any further anti-coag. Sheath removal at approximately 1000 as well. Hgb 12.1, plt low at 103.   Goal of Therapy:  Heparin level 0.3-0.7 units/ml Monitor platelets by anticoagulation protocol: Yes   Plan:  -Start heparin at 1250 units/hr (no bolus) at 1800 -8hr HL -Monitor CBC, s/sx bleeding   Stephens November, PharmD Clinical Pharmacist 4:46 PM, 07/10/2016

## 2016-07-11 ENCOUNTER — Inpatient Hospital Stay (HOSPITAL_COMMUNITY): Payer: 59

## 2016-07-11 ENCOUNTER — Encounter (HOSPITAL_COMMUNITY): Payer: Self-pay | Admitting: *Deleted

## 2016-07-11 DIAGNOSIS — I200 Unstable angina: Secondary | ICD-10-CM

## 2016-07-11 LAB — COMPREHENSIVE METABOLIC PANEL
ALT: 18 U/L (ref 17–63)
AST: 21 U/L (ref 15–41)
Albumin: 3.6 g/dL (ref 3.5–5.0)
Alkaline Phosphatase: 31 U/L — ABNORMAL LOW (ref 38–126)
Anion gap: 9 (ref 5–15)
BUN: 13 mg/dL (ref 6–20)
CO2: 25 mmol/L (ref 22–32)
Calcium: 9.2 mg/dL (ref 8.9–10.3)
Chloride: 101 mmol/L (ref 101–111)
Creatinine, Ser: 1.07 mg/dL (ref 0.61–1.24)
GFR calc Af Amer: >60 mL/min (ref >60)
GFR calc non Af Amer: >60 mL/min (ref >60)
Glucose, Bld: 185 mg/dL — ABNORMAL HIGH (ref 65–99)
Potassium: 3.8 mmol/L (ref 3.5–5.1)
Sodium: 135 mmol/L (ref 135–145)
Total Bilirubin: 0.7 mg/dL (ref 0.3–1.2)
Total Protein: 6.2 g/dL — ABNORMAL LOW (ref 6.5–8.1)

## 2016-07-11 LAB — PULMONARY FUNCTION TEST
DL/VA % PRED: 83 %
DL/VA: 3.81 ml/min/mmHg/L
DLCO UNC: 22.45 ml/min/mmHg
DLCO unc % pred: 70 %
FEF 25-75 POST: 1.33 L/s
FEF 25-75 PRE: 0.9 L/s
FEF2575-%Change-Post: 47 %
FEF2575-%Pred-Post: 55 %
FEF2575-%Pred-Pre: 37 %
FEV1-%CHANGE-POST: 12 %
FEV1-%PRED-PRE: 58 %
FEV1-%Pred-Post: 65 %
FEV1-POST: 2.08 L
FEV1-Pre: 1.85 L
FEV1FVC-%Change-Post: 10 %
FEV1FVC-%PRED-PRE: 85 %
FEV6-%CHANGE-POST: 4 %
FEV6-%PRED-POST: 72 %
FEV6-%Pred-Pre: 69 %
FEV6-POST: 2.97 L
FEV6-Pre: 2.85 L
FEV6FVC-%CHANGE-POST: 3 %
FEV6FVC-%PRED-POST: 105 %
FEV6FVC-%Pred-Pre: 102 %
FVC-%Change-Post: 1 %
FVC-%Pred-Post: 69 %
FVC-%Pred-Pre: 68 %
FVC-PRE: 2.96 L
FVC-Post: 3.01 L
PRE FEV6/FVC RATIO: 96 %
Post FEV1/FVC ratio: 69 %
Post FEV6/FVC ratio: 99 %
Pre FEV1/FVC ratio: 63 %
RV % PRED: 169 %
RV: 4.12 L
TLC % PRED: 104 %
TLC: 7.26 L

## 2016-07-11 LAB — CBC
HCT: 37.6 % — ABNORMAL LOW (ref 39.0–52.0)
Hemoglobin: 12 g/dL — ABNORMAL LOW (ref 13.0–17.0)
MCH: 29.4 pg (ref 26.0–34.0)
MCHC: 31.9 g/dL (ref 30.0–36.0)
MCV: 92.2 fL (ref 78.0–100.0)
PLATELETS: 110 10*3/uL — AB (ref 150–400)
RBC: 4.08 MIL/uL — AB (ref 4.22–5.81)
RDW: 14 % (ref 11.5–15.5)
WBC: 7.4 10*3/uL (ref 4.0–10.5)

## 2016-07-11 LAB — TSH: TSH: 3.712 u[IU]/mL (ref 0.350–4.500)

## 2016-07-11 LAB — GLUCOSE, CAPILLARY
GLUCOSE-CAPILLARY: 259 mg/dL — AB (ref 65–99)
Glucose-Capillary: 165 mg/dL — ABNORMAL HIGH (ref 65–99)
Glucose-Capillary: 171 mg/dL — ABNORMAL HIGH (ref 65–99)
Glucose-Capillary: 197 mg/dL — ABNORMAL HIGH (ref 65–99)

## 2016-07-11 LAB — HEPARIN LEVEL (UNFRACTIONATED)
HEPARIN UNFRACTIONATED: 0.33 [IU]/mL (ref 0.30–0.70)
Heparin Unfractionated: 0.2 IU/mL — ABNORMAL LOW (ref 0.30–0.70)
Heparin Unfractionated: 0.3 IU/mL (ref 0.30–0.70)

## 2016-07-11 MED ORDER — ALBUTEROL SULFATE (2.5 MG/3ML) 0.083% IN NEBU
2.5000 mg | INHALATION_SOLUTION | Freq: Once | RESPIRATORY_TRACT | Status: AC
  Administered 2016-07-11: 2.5 mg via RESPIRATORY_TRACT

## 2016-07-11 MED ORDER — INSULIN GLARGINE 100 UNIT/ML ~~LOC~~ SOLN
18.0000 [IU] | Freq: Two times a day (BID) | SUBCUTANEOUS | Status: DC
  Administered 2016-07-11 – 2016-07-13 (×5): 18 [IU] via SUBCUTANEOUS
  Filled 2016-07-11 (×7): qty 0.18

## 2016-07-11 MED ORDER — FUROSEMIDE 20 MG PO TABS
20.0000 mg | ORAL_TABLET | Freq: Once | ORAL | Status: AC
  Administered 2016-07-11: 20 mg via ORAL
  Filled 2016-07-11: qty 1

## 2016-07-11 MED ORDER — LATANOPROST 0.005 % OP SOLN
1.0000 [drp] | Freq: Every day | OPHTHALMIC | Status: DC
  Administered 2016-07-11 – 2016-07-13 (×3): 1 [drp] via OPHTHALMIC
  Filled 2016-07-11: qty 2.5

## 2016-07-11 MED ORDER — POTASSIUM CHLORIDE CRYS ER 20 MEQ PO TBCR
40.0000 meq | EXTENDED_RELEASE_TABLET | Freq: Once | ORAL | Status: AC
  Administered 2016-07-11: 40 meq via ORAL
  Filled 2016-07-11: qty 2

## 2016-07-11 NOTE — Progress Notes (Signed)
ANTICOAGULATION CONSULT NOTE - Follow Up Consult  Pharmacy Consult for heparin Indication: CAD awaiting CABG  Labs:  Recent Labs  07/10/16 1618 07/10/16 1745 07/11/16 0228  HGB 12.1*  --  12.0*  HCT 37.7*  --  37.6*  PLT 103*  --  110*  APTT  --  28  --   LABPROT  --  15.6*  --   INR  --  1.23  --   HEPARINUNFRC  --   --  0.20*  CREATININE 1.04  --   --     Assessment: 70yo male subtherapeutic on heparin with initial dosing post-cath.  Goal of Therapy:  Heparin level 0.3-0.7 units/ml   Plan:  Will increase heparin gtt by 2-3 units/kg/hr to 1500 units/hr and check level in 6hr.  Wynona Neat, PharmD, BCPS  07/11/2016,3:18 AM

## 2016-07-11 NOTE — Progress Notes (Signed)
Patient Name: Tommy Sullivan Date of Encounter: 07/11/2016  Hospital Problem List     Active Problems:   Unstable angina [facility])    Patient Profile     Tommy Sullivan is a 70 y.o. male with coronary artery disease and atrial flutter status post ablation.  He has known history of coronary artery disease with previous myocardial infarction  in 2009 with PCI done by Dr. Collene Mares. He has other chronic medical conditions that include type 2 diabetes, hypertension and hyperlipidemia.  A recent nuclear stress test which showed evidence of prior inferior and inferolateral infarct with ejection fraction of 34%.   Subjective   No chest pain  Inpatient Medications    . aspirin EC  81 mg Oral Daily  . fenofibrate  160 mg Oral Daily  . insulin aspart  0-15 Units Subcutaneous TID WC  . insulin aspart  0-5 Units Subcutaneous QHS  . insulin glargine  12 Units Subcutaneous BID  . nebivolol  10 mg Oral Daily  . rosuvastatin  10 mg Oral Daily  . tamsulosin  0.4 mg Oral QPC supper    Vital Signs    Vitals:   07/10/16 1322 07/10/16 1936 07/11/16 0500  BP: 111/70 122/79 105/63  Pulse: 63 74 61  Resp: 20 (!) 27 20  Temp: 98.4 F (36.9 C) 98 F (36.7 C) 97.9 F (36.6 C)  TempSrc: Oral Oral Oral  SpO2: 99% 99% 99%  Weight: 240 lb 9.6 oz (109.1 kg)  237 lb 11.2 oz (107.8 kg)  Height: 5' 9.5" (1.765 m)      Intake/Output Summary (Last 24 hours) at 07/11/16 0837 Last data filed at 07/11/16 0500  Gross per 24 hour  Intake           561.46 ml  Output             3600 ml  Net         -3038.54 ml   Filed Weights   07/10/16 1322 07/11/16 0500  Weight: 240 lb 9.6 oz (109.1 kg) 237 lb 11.2 oz (107.8 kg)    Physical Exam    GEN: Well nourished, well developed, in no acute distress.  Neck: Supple, no JVD, carotid bruits, or masses. Cardiac: RRR, no rubs, or gallops. No clubbing, cyanosis, mild edema.  Radials/DP/PT 2+ and equal bilaterally.  Respiratory:  Respirations  regular and  unlabored, clear to auscultation bilaterally. GI: Soft, nontender, nondistended, BS + x 4. Neuro:  Strength and sensation are intact.   Labs    CBC  Recent Labs  07/10/16 1618 07/11/16 0228  WBC 6.2 7.4  HGB 12.1* 12.0*  HCT 37.7* 37.6*  MCV 92.0 92.2  PLT 103* [id number]*   Basic Metabolic Panel  Recent Labs  07/10/16 1618 07/11/16 0228  NA 137 135  K 3.9 3.8  CL 103 101  CO2 27 25  GLUCOSE 284* 185*  BUN 11 13  CREATININE 1.04 1.07  CALCIUM 9.2 9.2   Liver Function Tests  Recent Labs  07/11/16 0228  AST 21  ALT 18  ALKPHOS 31*  BILITOT 0.7  PROT 6.2*  ALBUMIN 3.6   No results for input(s): LIPASE, AMYLASE in the last 72 hours. Cardiac Enzymes No results for input(s): CKTOTAL, CKMB, CKMBINDEX, TROPONINI in the last 72 hours. BNP Invalid input(s): POCBNP D-Dimer No results for input(s): DDIMER in the last 72 hours. Hemoglobin A1C No results for input(s): HGBA1C in the last 72 hours. Fasting Lipid Panel No results for  input(s): CHOL, HDL, LDLCALC, TRIG, CHOLHDL, LDLDIRECT in the last 72 hours. Thyroid Function Tests  Recent Labs  07/11/16 0228  TSH 3.712     ECG    NA  Radiology    Nm Myocar Multi W/spect W/wall Motion / Ef  Result Date: 06/29/2016 Pharmacological myocardial perfusion imaging study with no significant ischemia Large region of fixed perfusion defect in the inferior and inferolateral wall consistent with previous MI, though significant GI uptake artifact noted. Improved perfusion noted on attenuation corrected images though still with moderate fixed perfusion defect noted inferior and inferolateral wall. Lateral wall motion abnormality noted, EF estimated at 34% No EKG changes concerning for ischemia at peak stress or in recovery. Moderate risk scan Signed, Esmond Plants, MD, Ph.D [facility]    Assessment & Plan     Unstable angina: Cardiac catheterization showed severe three-vessel coronary artery disease with mildly reduced LV  systolic function.   He has been seen by DR. Prescott Gum and surgery is planned for Friday.  Echo is pending.   Acute on chronic systolic heart failure: His LVEDP was moderately elevated. He had significant diuresis yesterday.   He still has some mild edema and I will give PO Lasix today.   Status post atrial flutter ablation  Hyperlipidemia: Continue treatment with rosuvastatin.  Type 2 diabetes: Hold metformin. Sliding scale coverage.  Sleep apnea:     Signed, Minus Breeding, MD  07/11/2016, 8:37 AM

## 2016-07-11 NOTE — Care Management Note (Signed)
Case Management Note  Patient Details  Name: Tommy Sullivan MRN: [id number] Date of Birth: 05/01/1946  Subjective/Objective: Pt presented for chest pain. Pt is post cardiac catheterization which showed 3 vessel disease.  Per MD notes: Plan multivessel CABG first available OR date which is Friday, October 13.                    Action/Plan: Pt is from home with wife. Plan will be to return home once stable. Pt may benefit from [place]. Pt is connected with the [facility] via [facility] for Diabetes Management. [facility] to follow pt post d/c. CM will continue to monitor for additional needs.   Expected Discharge Date:                  Expected Discharge Plan:  [place]  In-House Referral:     Discharge planning Services  CM Consult  Post Acute Care Choice:    Choice offered to:     DME Arranged:    DME Agency:     HH Arranged:    [place] Agency:     Status of Service:  In process, will continue to follow  If discussed at Long Length of Stay Meetings, dates discussed:    Additional Comments:  Bethena Roys, RN 07/11/2016, 4:10 PM

## 2016-07-11 NOTE — Progress Notes (Signed)
ANTICOAGULATION CONSULT NOTE - Follow Up Consult  Pharmacy Consult for Heparin Indication: CAD pending CABG  No Known Allergies  Patient Measurements: Height: 5' 9.5" (176.5 cm) Weight: 237 lb 11.2 oz (107.8 kg) IBW/kg (Calculated) : 71.85 Heparin Dosing Weight: 95 kg  Vital Signs: Temp: 97.6 F (36.4 C) (10/10 1348) Temp Source: Oral (10/10 1348) BP: 120/80 (10/10 1348) Pulse Rate: 58 (10/10 1348)  Labs:  Recent Labs  07/10/16 1618 07/10/16 1745 07/11/16 0228 07/11/16 1020 07/11/16 1821  HGB 12.1*  --  12.0*  --   --   HCT 37.7*  --  37.6*  --   --   PLT 103*  --  110*  --   --   APTT  --  28  --   --   --   LABPROT  --  15.6*  --   --   --   INR  --  1.23  --   --   --   HEPARINUNFRC  --   --  0.20* 0.30 0.33  CREATININE 1.04  --  1.07  --   --     Estimated Creatinine Clearance: 78.4 mL/min (by C-G formula based on SCr of 1.07 mg/dL).   Medications:  Scheduled:  . aspirin EC  81 mg Oral Daily  . fenofibrate  160 mg Oral Daily  . insulin aspart  0-15 Units Subcutaneous TID WC  . insulin aspart  0-5 Units Subcutaneous QHS  . insulin glargine  18 Units Subcutaneous BID  . latanoprost  1 drop Both Eyes QHS  . nebivolol  10 mg Oral Daily  . rosuvastatin  10 mg Oral Daily  . tamsulosin  0.4 mg Oral QPC supper   Infusions:  . heparin 1,650 Units/hr (07/11/16 1231)    Assessment: 70 yo M s/p cath at [facility] and transferred to [facility] for [facility]G eval.  Plan for surgery 10/13. HL remains therapeutic at 0.33. CBC low stable. No overt bleeding reported.  Goal of Therapy:  Heparin level 0.3-0.7 units/ml Monitor platelets by anticoagulation protocol: Yes   Plan:  Continue heparin at 1650 units/hr Heparin level and CBC daily while on heparin  Stephens November, PharmD Clinical Pharmacist 7:18 PM, 07/11/2016

## 2016-07-11 NOTE — Consult Note (Signed)
   [facility] CM Inpatient Consult   07/11/2016  JEWEL CURTRIGHT 07-Dec-1945 [id number]    Mr. Quale has been active with Link to [employer] program for [employer] employees/dependents with [facility] insurance for DM management. Spoke with Mr. Eary at length at bedside. He reports he is going to have a CABG on Friday. He lives with his wife who recently retired with [employer]. He plans on returning home upon discharge. He has been very independent.  Mr. Kilmer reports that his insurance will be changing to [facility] Advantage since his wife is retiring. Made him aware that he will be eligible for [facility] Care Management program once that happens. Written consent obtained for future needs with [facility] Care Management program. [place] Management packet, contact information, and 24-hr nurse line magnet provided.   Mr. Mylin appreciative of visit and further follow up. Will alert Link to [employer] of bedside visit and conversation. Made inpatient RNCM aware of above.  Will continue to follow throughout hospitalization.   Marthenia Rolling, MSN-Ed, RN,BSN [facility] Liaison [phone]

## 2016-07-11 NOTE — Progress Notes (Signed)
Pt inquiring about Lasix rx, rounding note reviewed. Will give 20mg  po Lasix today along with 86meq of potassium. Further dosing to be addressed in rounds tomorrow. Dayna Dunn PA-C

## 2016-07-11 NOTE — Progress Notes (Signed)
ANTICOAGULATION CONSULT NOTE - Follow Up Consult  Pharmacy Consult for Heparin Indication: CAD pending CABG  No Known Allergies  Patient Measurements: Height: 5' 9.5" (176.5 cm) Weight: 237 lb 11.2 oz (107.8 kg) IBW/kg (Calculated) : 71.85 Heparin Dosing Weight: 95 kg  Vital Signs: Temp: 97.9 F (36.6 C) (10/10 0500) Temp Source: Oral (10/10 0500) BP: 105/63 (10/10 0500) Pulse Rate: 61 (10/10 0500)  Labs:  Recent Labs  07/10/16 1618 07/10/16 1745 07/11/16 0228 07/11/16 1020  HGB 12.1*  --  12.0*  --   HCT 37.7*  --  37.6*  --   PLT 103*  --  110*  --   APTT  --  28  --   --   LABPROT  --  15.6*  --   --   INR  --  1.23  --   --   HEPARINUNFRC  --   --  0.20* 0.30  CREATININE 1.04  --  1.07  --     Estimated Creatinine Clearance: 78.4 mL/min (by C-G formula based on SCr of 1.07 mg/dL).   Medications:  Scheduled:  . aspirin EC  81 mg Oral Daily  . fenofibrate  160 mg Oral Daily  . insulin aspart  0-15 Units Subcutaneous TID WC  . insulin aspart  0-5 Units Subcutaneous QHS  . insulin glargine  12 Units Subcutaneous BID  . nebivolol  10 mg Oral Daily  . rosuvastatin  10 mg Oral Daily  . tamsulosin  0.4 mg Oral QPC supper   Infusions:  . heparin 1,500 Units/hr (07/11/16 0330)    Assessment: 70 yo M s/p cath at [facility] and transferred to [facility] for CABG eval.  Plan for surgery 10/13. Pt is at lower end of therapeutic goal on 1500 units/hr.  Will increase gtt to target mid-range of goal.  Goal of Therapy:  Heparin level 0.3-0.7 units/ml Monitor platelets by anticoagulation protocol: Yes   Plan:  Increase heparin to 1650 units/hr Repeat heparin level in 6 hours for confirmation Heparin level and CBC daily while on heparin  [employer], Pharm.D., BCPS Clinical Pharmacist Pager [phone] 07/11/2016 11:45 AM

## 2016-07-12 ENCOUNTER — Inpatient Hospital Stay (HOSPITAL_COMMUNITY): Payer: 59

## 2016-07-12 DIAGNOSIS — Z01810 Encounter for preprocedural cardiovascular examination: Secondary | ICD-10-CM

## 2016-07-12 DIAGNOSIS — I2510 Atherosclerotic heart disease of native coronary artery without angina pectoris: Secondary | ICD-10-CM

## 2016-07-12 DIAGNOSIS — R079 Chest pain, unspecified: Secondary | ICD-10-CM

## 2016-07-12 LAB — ECHOCARDIOGRAM LIMITED
HEIGHTINCHES: 69.5 in
Weight: 3777.8 oz

## 2016-07-12 LAB — BASIC METABOLIC PANEL
Anion gap: 9 (ref 5–15)
BUN: 15 mg/dL (ref 6–20)
CO2: 29 mmol/L (ref 22–32)
Calcium: 10.2 mg/dL (ref 8.9–10.3)
Chloride: 98 mmol/L — ABNORMAL LOW (ref 101–111)
Creatinine, Ser: 0.94 mg/dL (ref 0.61–1.24)
GFR calc Af Amer: >60 mL/min (ref >60)
GFR calc non Af Amer: >60 mL/min (ref >60)
Glucose, Bld: 202 mg/dL — ABNORMAL HIGH (ref 65–99)
Potassium: 4.3 mmol/L (ref 3.5–5.1)
Sodium: 136 mmol/L (ref 135–145)

## 2016-07-12 LAB — CBC
HCT: 42.3 % (ref 39.0–52.0)
Hemoglobin: 13.7 g/dL (ref 13.0–17.0)
MCH: 29.4 pg (ref 26.0–34.0)
MCHC: 32.4 g/dL (ref 30.0–36.0)
MCV: 90.8 fL (ref 78.0–100.0)
Platelets: 121 10*3/uL — ABNORMAL LOW (ref 150–400)
RBC: 4.66 MIL/uL (ref 4.22–5.81)
RDW: 14.2 % (ref 11.5–15.5)
WBC: 7.6 10*3/uL (ref 4.0–10.5)

## 2016-07-12 LAB — HEMOGLOBIN A1C
Hgb A1c MFr Bld: 8.8 % — ABNORMAL HIGH (ref 4.8–5.6)
Mean Plasma Glucose: 206 mg/dL

## 2016-07-12 LAB — VAS US DOPPLER PRE CABG
LEFT ECA DIAS: -19 cm/s
LEFT VERTEBRAL DIAS: 15 cm/s
Left CCA dist dias: -18 cm/s
Left CCA dist sys: -69 cm/s
Left CCA prox dias: 15 cm/s
Left CCA prox sys: 63 cm/s
Left ICA dist dias: -27 cm/s
Left ICA dist sys: -80 cm/s
Left ICA prox dias: -29 cm/s
Left ICA prox sys: -75 cm/s
RIGHT ECA DIAS: -12 cm/s
RIGHT VERTEBRAL DIAS: 10 cm/s
Right CCA prox dias: 24 cm/s
Right CCA prox sys: 85 cm/s
Right cca dist sys: -71 cm/s

## 2016-07-12 LAB — GLUCOSE, CAPILLARY
GLUCOSE-CAPILLARY: 244 mg/dL — AB (ref 65–99)
GLUCOSE-CAPILLARY: 200 mg/dL — AB (ref 65–99)
Glucose-Capillary: 164 mg/dL — ABNORMAL HIGH (ref 65–99)
Glucose-Capillary: 192 mg/dL — ABNORMAL HIGH (ref 65–99)

## 2016-07-12 LAB — HEPARIN LEVEL (UNFRACTIONATED): Heparin Unfractionated: 0.42 IU/mL (ref 0.30–0.70)

## 2016-07-12 MED ORDER — TAMSULOSIN HCL 0.4 MG PO CAPS
0.4000 mg | ORAL_CAPSULE | Freq: Every day | ORAL | Status: DC
  Administered 2016-07-12 – 2016-07-13 (×2): 0.4 mg via ORAL
  Filled 2016-07-12 (×2): qty 1

## 2016-07-12 MED ORDER — ROSUVASTATIN CALCIUM 10 MG PO TABS
10.0000 mg | ORAL_TABLET | Freq: Every day | ORAL | Status: DC
  Administered 2016-07-12 – 2016-07-13 (×2): 10 mg via ORAL
  Filled 2016-07-12 (×2): qty 1

## 2016-07-12 NOTE — Progress Notes (Signed)
Patient Name: Tommy Sullivan Date of Encounter: 07/12/2016  Hospital Problem List     Active Problems:   Unstable angina [facility]    Patient Profile     DEGAN MCCRERY is a 70 y.o. male with coronary artery disease and atrial flutter status post ablation.  He has known history of coronary artery disease with previous myocardial infarction  in 2009 with PCI done by Dr. Collene Mares. He has other chronic medical conditions that include type 2 diabetes, hypertension and hyperlipidemia.  A recent nuclear stress test which showed evidence of prior inferior and inferolateral infarct with ejection fraction of 34%.   Subjective   No chest pain.  Breathing OK.   Inpatient Medications    . aspirin EC  81 mg Oral Daily  . fenofibrate  160 mg Oral Daily  . insulin aspart  0-15 Units Subcutaneous TID WC  . insulin aspart  0-5 Units Subcutaneous QHS  . insulin glargine  18 Units Subcutaneous BID  . latanoprost  1 drop Both Eyes QHS  . nebivolol  10 mg Oral Daily  . rosuvastatin  10 mg Oral Daily  . tamsulosin  0.4 mg Oral QPC supper    Vital Signs    Vitals:   07/11/16 2054 07/11/16 2346 07/12/16 0500 07/12/16 0839  BP: (!) 140/100  115/68   Pulse: (!) 55  61   Resp: 19 15 16    Temp: 98.8 F (37.1 C)  98.3 F (36.8 C)   TempSrc: Oral  Oral   SpO2: 95% (!) 6% 98%   Weight:    236 lb 1.8 oz (107.1 kg)  Height:        Intake/Output Summary (Last 24 hours) at 07/12/16 0904 Last data filed at 07/11/16 2100  Gross per 24 hour  Intake              600 ml  Output             2200 ml  Net            -1600 ml     Filed Weights   07/10/16 1322 07/11/16 0500 07/12/16 0839  Weight: 240 lb 9.6 oz (109.1 kg) 237 lb 11.2 oz (107.8 kg) 236 lb 1.8 oz (107.1 kg)    Physical Exam    GEN: Well nourished, well developed, in no acute distress.  Neck: Supple, no JVD, carotid bruits, or masses. Cardiac: RRR, no rubs, or gallops. No clubbing, cyanosis, trace edema.  Radials/DP/PT 2+ and  equal bilaterally.  Respiratory:  Respirations  regular and unlabored, clear to auscultation bilaterally. GI: Soft, nontender, nondistended, BS + x 4. Neuro:  Strength and sensation are intact.   Labs    CBC  Recent Labs  07/11/16 0228 07/12/16 0208  WBC 7.4 7.6  HGB 12.0* 13.7  HCT 37.6* 42.3  MCV 92.2 90.8  PLT 110* [id number]*   Basic Metabolic Panel  Recent Labs  07/11/16 0228 07/12/16 0208  NA 135 136  K 3.8 4.3  CL 101 98*  CO2 25 29  GLUCOSE 185* 202*  BUN 13 15  CREATININE 1.07 0.94  CALCIUM 9.2 10.2   Liver Function Tests  Recent Labs  07/11/16 0228  AST 21  ALT 18  ALKPHOS 31*  BILITOT 0.7  PROT 6.2*  ALBUMIN 3.6   No results for input(s): LIPASE, AMYLASE in the last 72 hours. Cardiac Enzymes No results for input(s): CKTOTAL, CKMB, CKMBINDEX, TROPONINI in the last 72 hours. BNP Invalid input(s):  POCBNP D-Dimer No results for input(s): DDIMER in the last 72 hours. Hemoglobin A1C  Recent Labs  07/11/16 0228  HGBA1C 8.8*   Fasting Lipid Panel No results for input(s): CHOL, HDL, LDLCALC, TRIG, CHOLHDL, LDLDIRECT in the last 72 hours. Thyroid Function Tests  Recent Labs  07/11/16 0228  TSH 3.712     ECG    NA  Radiology    Dg Chest 2 View  Result Date: 07/11/2016 CLINICAL DATA:  Angina.  Cough.  Coronary artery disease. EXAM: CHEST  2 VIEW COMPARISON:  12/24/2015 FINDINGS: The heart is mildly enlarged. There is aortic atherosclerosis. There is pulmonary venous hypertension with mild interstitial edema. Small effusions in the posterior costophrenic angles. No focal pulmonary lesion. No bone abnormality. IMPRESSION: Aortic atherosclerosis. Cardiomegaly. Interstitial pulmonary edema and small effusions. Electronically Signed   By: Nelson Chimes M.D.   On: 07/11/2016 09:35   Nm Myocar Multi W/spect W/wall Motion / Ef  Result Date: 06/29/2016 Pharmacological myocardial perfusion imaging study with no significant ischemia Large region of  fixed perfusion defect in the inferior and inferolateral wall consistent with previous MI, though significant GI uptake artifact noted. Improved perfusion noted on attenuation corrected images though still with moderate fixed perfusion defect noted inferior and inferolateral wall. Lateral wall motion abnormality noted, EF estimated at 34% No EKG changes concerning for ischemia at peak stress or in recovery. Moderate risk scan Signed, Esmond Plants, MD, Ph.D [facility]    Assessment & Plan     Unstable angina: Cardiac catheterization showed severe three-vessel coronary artery disease with mildly reduced LV systolic function.   He has been seen by Dr. Prescott Gum and surgery is planned for Friday.  Echo is still pending.   Acute on chronic systolic heart failure: His LVEDP was moderately elevated. He had good diuresis yesterday on PO diuretic.    Status post atrial flutter ablation:  Maintaining NSR.   Hyperlipidemia: Continue treatment with rosuvastatin.  Type 2 diabetes:   Hold metformin. Sliding scale coverage.  Sleep apnea:    On CPAP.   Signed, Minus Breeding, MD  07/12/2016, 9:04 AM

## 2016-07-12 NOTE — Progress Notes (Signed)
Placed patient on CPAP for the night via auto-mode.  

## 2016-07-12 NOTE — Progress Notes (Signed)
CARDIAC REHAB PHASE I   PRE:  Rate/Rhythm: 70 SR  BP:  Sitting: 127/88        SaO2: 98 RA  MODE:  Ambulation: 500 ft   POST:  Rate/Rhythm: 72 SR  BP:  Sitting: 126/81         SaO2: 100 RA  Pt ambulated 500 ft on RA, IV, assist x1, steady gait, tolerated well with no complaints other than mild DOE. Cardiac surgery pre-op education completed with pt and wife at bedside. Reviewed IS, sternal precautions, activity progression, cardiac surgery booklet and cardiac surgery guidelines. Left instructions to view cardiac surgery videos. Pt and wife verbalized understanding. Pt to bed per pt request after walk, call bell within reach. Will follow.   [place], RN, BSN 07/12/2016 12:30 PM

## 2016-07-12 NOTE — Progress Notes (Signed)
ANTICOAGULATION CONSULT NOTE  Pharmacy Consult for Heparin Indication: CAD pending CABG  No Known Allergies  Patient Measurements: Height: 5' 9.5" (176.5 cm) Weight: 236 lb 1.8 oz (107.1 kg) IBW/kg (Calculated) : 71.85 Heparin Dosing Weight: 95 kg  Vital Signs: Temp: 98.3 F (36.8 C) (10/11 0500) Temp Source: Oral (10/11 0500) BP: 115/68 (10/11 0500) Pulse Rate: 61 (10/11 0500)  Labs:  Recent Labs  07/10/16 1618 07/10/16 1745  07/11/16 0228 07/11/16 1020 07/11/16 1821 07/12/16 0208  HGB 12.1*  --   --  12.0*  --   --  13.7  HCT 37.7*  --   --  37.6*  --   --  42.3  PLT 103*  --   --  110*  --   --  121*  APTT  --  28  --   --   --   --   --   LABPROT  --  15.6*  --   --   --   --   --   INR  --  1.23  --   --   --   --   --   HEPARINUNFRC  --   --   < > 0.20* 0.30 0.33 0.42  CREATININE 1.04  --   --  1.07  --   --  0.94  < > = values in this interval not displayed.  Estimated Creatinine Clearance: 88.9 mL/min (by C-G formula based on SCr of 0.94 mg/dL).   Medications:  Scheduled:  . aspirin EC  81 mg Oral Daily  . fenofibrate  160 mg Oral Daily  . insulin aspart  0-15 Units Subcutaneous TID WC  . insulin aspart  0-5 Units Subcutaneous QHS  . insulin glargine  18 Units Subcutaneous BID  . latanoprost  1 drop Both Eyes QHS  . nebivolol  10 mg Oral Daily  . rosuvastatin  10 mg Oral QHS  . tamsulosin  0.4 mg Oral QPC supper   Infusions:  . heparin 1,650 Units/hr (07/11/16 1231)    Assessment: 70 yo male who had a cath at [facility]. Cath showed 3 vessel coronary disease. Mr. Tommy Sullivan will have a CABG on Friday 10/13. Until then, he remains on a heparin infusion. Heparin level remains therapeutic. hgb 13.7, plts silghtly low, but ok at 121.    Goal of Therapy:  Heparin level 0.3-0.7 units/ml Monitor platelets by anticoagulation protocol: Yes    Plan:  Continue heparin at 1650 units/hr Heparin level and CBC daily while on heparin     Hughes Better,  PharmD, BCPS Clinical Pharmacist 07/12/2016 9:56 AM

## 2016-07-12 NOTE — Progress Notes (Signed)
Procedure(s) (LRB): CORONARY ARTERY BYPASS GRAFTING (CABG) (N/A) TRANSESOPHAGEAL ECHOCARDIOGRAM (TEE) (N/A) Subjective: Severe three-vessel CAD without angina on IV heparin Blood glucose levels improved on split doses Lantus plus sliding scale Pre-CABG Dopplers pending Surgery scheduled for Friday, October 13.  Objective: Vital signs in last 24 hours: Temp:  [97.6 F (36.4 C)-98.8 F (37.1 C)] 98.3 F (36.8 C) (10/11 0500) Pulse Rate:  [55-61] 61 (10/11 0500) Cardiac Rhythm: Normal sinus rhythm (10/11 0800) Resp:  [15-19] 16 (10/11 0500) BP: (115-140)/(68-100) 115/68 (10/11 0500) SpO2:  [6 %-99 %] 98 % (10/11 0500) Weight:  [236 lb 1.8 oz (107.1 kg)] 236 lb 1.8 oz (107.1 kg) (10/11 0839)  Hemodynamic parameters for last 24 hours:    Intake/Output from previous day: 10/10 0701 - 10/11 0700 In: 900 [P.O.:900] Out: 2200 [Urine:2200] Intake/Output this shift: Total I/O In: 240 [P.O.:240] Out: 700 [Urine:700]       Exam    General- alert and comfortable   Lungs- clear without rales, wheezes   Cor- regular rate and rhythm, no murmur , gallop   Abdomen- soft, non-tender   Extremities - warm, non-tender, minimal edema   Neuro- oriented, appropriate, no focal weakness   Lab Results:  Recent Labs  07/11/16 0228 07/12/16 0208  WBC 7.4 7.6  HGB 12.0* 13.7  HCT 37.6* 42.3  PLT 110* 121*   BMET:  Recent Labs  07/11/16 0228 07/12/16 0208  NA 135 136  K 3.8 4.3  CL 101 98*  CO2 25 29  GLUCOSE 185* 202*  BUN 13 15  CREATININE 1.07 0.94  CALCIUM 9.2 10.2    PT/INR:  Recent Labs  07/10/16 1745  LABPROT 15.6*  INR 1.23   ABG No results found for: PHART, HCO3, TCO2, ACIDBASEDEF, O2SAT CBG (last 3)   Recent Labs  07/11/16 2109 07/12/16 0738 07/12/16 1142  GLUCAP 197* 164* 244*    Assessment/Plan: S/P Procedure(s) (LRB): CORONARY ARTERY BYPASS GRAFTING (CABG) (N/A) TRANSESOPHAGEAL ECHOCARDIOGRAM (TEE) (N/A) Mobilize Diuresis Diabetes  control CABG October 13 a.m.   LOS: 2 days    Tharon Aquas Trigt III 07/12/2016

## 2016-07-12 NOTE — Progress Notes (Signed)
Pre-op Cardiac Surgery  Carotid Findings:  Findings suggest 1-39% internal carotid artery stenosis bilaterally. Vertebral arteries are patent with antegrade flow.  Upper Extremity Right Left  Brachial Pressures 127-Triphasic 109-Triphasic  Radial Waveforms Triphasic Triphasic  Ulnar Waveforms Triphasic Triphasic  Palmar Arch (Allen's Test) Signal obliterates with radial compression, unaffected with ulnar compression. Signal obliterates with radial compression, unaffected with ulnar compression.   Lower  Extremity Right Left  Dorsalis Pedis 118-Biphasic 107-Monophasic  Anterior Tibial    Posterior Tibial 152-Biphasic 141-Monophasic  Ankle/Brachial Indices 1.2 1.11    Findings:   Bilateral ABIs are within normal limits, however given abnormal waveforms this may be falsely elevated due to medial calcification.   07/12/2016 10:29 AM Maudry Mayhew, BS, RVT, RDCS, RDMS

## 2016-07-12 NOTE — Progress Notes (Signed)
  Echocardiogram 2D Echocardiogram has been performed.  Tommy Sullivan 07/12/2016, 4:16 PM

## 2016-07-13 LAB — CBC
HEMATOCRIT: 39.7 % (ref 39.0–52.0)
HEMOGLOBIN: 12.8 g/dL — AB (ref 13.0–17.0)
MCH: 29 pg (ref 26.0–34.0)
MCHC: 32.2 g/dL (ref 30.0–36.0)
MCV: 90 fL (ref 78.0–100.0)
Platelets: 117 10*3/uL — ABNORMAL LOW (ref 150–400)
RBC: 4.41 MIL/uL (ref 4.22–5.81)
RDW: 13.9 % (ref 11.5–15.5)
WBC: 7.8 10*3/uL (ref 4.0–10.5)

## 2016-07-13 LAB — PREPARE RBC (CROSSMATCH)

## 2016-07-13 LAB — HEPARIN LEVEL (UNFRACTIONATED): Heparin Unfractionated: 0.46 IU/mL (ref 0.30–0.70)

## 2016-07-13 LAB — BLOOD GAS, ARTERIAL
ACID-BASE EXCESS: 0.3 mmol/L (ref 0.0–2.0)
Bicarbonate: 23.4 mmol/L (ref 20.0–28.0)
DRAWN BY: 406621
FIO2: 21
O2 SAT: 98.5 %
PATIENT TEMPERATURE: 98.6
PCO2 ART: 31.1 mmHg — AB (ref 32.0–48.0)
pH, Arterial: 7.488 — ABNORMAL HIGH (ref 7.350–7.450)
pO2, Arterial: 108 mmHg (ref 83.0–108.0)

## 2016-07-13 LAB — GLUCOSE, CAPILLARY
GLUCOSE-CAPILLARY: 158 mg/dL — AB (ref 65–99)
GLUCOSE-CAPILLARY: 154 mg/dL — AB (ref 65–99)
GLUCOSE-CAPILLARY: 148 mg/dL — AB (ref 65–99)
Glucose-Capillary: 140 mg/dL — ABNORMAL HIGH (ref 65–99)

## 2016-07-13 LAB — ABO/RH: ABO/RH(D): A POS

## 2016-07-13 MED ORDER — SODIUM CHLORIDE 0.9 % IV SOLN
INTRAVENOUS | Status: DC
  Filled 2016-07-13: qty 2.5

## 2016-07-13 MED ORDER — DEXTROSE 5 % IV SOLN
750.0000 mg | INTRAVENOUS | Status: DC
  Filled 2016-07-13: qty 750

## 2016-07-13 MED ORDER — NITROGLYCERIN IN D5W 200-5 MCG/ML-% IV SOLN
2.0000 ug/min | INTRAVENOUS | Status: DC
  Filled 2016-07-13: qty 250

## 2016-07-13 MED ORDER — TRANEXAMIC ACID (OHS) BOLUS VIA INFUSION
15.0000 mg/kg | INTRAVENOUS | Status: DC
  Filled 2016-07-13: qty 1590

## 2016-07-13 MED ORDER — TEMAZEPAM 15 MG PO CAPS: 15.0000 mg | ORAL_CAPSULE | Freq: Once | ORAL | Status: DC | PRN

## 2016-07-13 MED ORDER — EPINEPHRINE PF 1 MG/ML IJ SOLN
0.0000 ug/min | INTRAVENOUS | Status: DC
  Filled 2016-07-13: qty 4

## 2016-07-13 MED ORDER — HEPARIN SODIUM (PORCINE) 1000 UNIT/ML IJ SOLN
INTRAMUSCULAR | Status: DC
  Filled 2016-07-13: qty 30

## 2016-07-13 MED ORDER — CHLORHEXIDINE GLUCONATE 4 % EX LIQD
60.0000 mL | Freq: Once | CUTANEOUS | Status: AC
  Administered 2016-07-13: 4 via TOPICAL
  Filled 2016-07-13: qty 60

## 2016-07-13 MED ORDER — DEXTROSE 5 % IV SOLN
1.5000 g | INTRAVENOUS | Status: DC
  Filled 2016-07-13: qty 1.5

## 2016-07-13 MED ORDER — TRANEXAMIC ACID (OHS) PUMP PRIME SOLUTION
2.0000 mg/kg | INTRAVENOUS | Status: DC
Start: 2016-07-14 — End: 2016-07-14
  Filled 2016-07-13: qty 2.12

## 2016-07-13 MED ORDER — POTASSIUM CHLORIDE 2 MEQ/ML IV SOLN
80.0000 meq | INTRAVENOUS | Status: DC
  Filled 2016-07-13: qty 40

## 2016-07-13 MED ORDER — CHLORHEXIDINE GLUCONATE 4 % EX LIQD
60.0000 mL | Freq: Once | CUTANEOUS | Status: AC
  Administered 2016-07-14: 4 via TOPICAL
  Filled 2016-07-13: qty 60

## 2016-07-13 MED ORDER — DEXMEDETOMIDINE HCL IN NACL 400 MCG/100ML IV SOLN
0.1000 ug/kg/h | INTRAVENOUS | Status: DC
  Filled 2016-07-13: qty 100

## 2016-07-13 MED ORDER — VANCOMYCIN HCL 10 G IV SOLR
1250.0000 mg | INTRAVENOUS | Status: DC
  Filled 2016-07-13: qty 1250

## 2016-07-13 MED ORDER — PLASMA-LYTE 148 IV SOLN
INTRAVENOUS | Status: DC
  Filled 2016-07-13: qty 2.5

## 2016-07-13 MED ORDER — TRANEXAMIC ACID 1000 MG/10ML IV SOLN
1.5000 mg/kg/h | INTRAVENOUS | Status: DC
  Filled 2016-07-13: qty 25

## 2016-07-13 MED ORDER — BISACODYL 5 MG PO TBEC
5.0000 mg | DELAYED_RELEASE_TABLET | Freq: Once | ORAL | Status: AC
  Administered 2016-07-13: 5 mg via ORAL
  Filled 2016-07-13: qty 1

## 2016-07-13 MED ORDER — MAGNESIUM SULFATE 50 % IJ SOLN
40.0000 meq | INTRAMUSCULAR | Status: DC
  Filled 2016-07-13: qty 10

## 2016-07-13 MED ORDER — PHENYLEPHRINE HCL 10 MG/ML IJ SOLN
30.0000 ug/min | INTRAVENOUS | Status: DC
  Filled 2016-07-13: qty 2

## 2016-07-13 MED ORDER — CHLORHEXIDINE GLUCONATE 0.12 % MT SOLN
15.0000 mL | Freq: Once | OROMUCOSAL | Status: AC
  Administered 2016-07-14: 15 mL via OROMUCOSAL
  Filled 2016-07-13: qty 15

## 2016-07-13 MED ORDER — DOPAMINE-DEXTROSE 3.2-5 MG/ML-% IV SOLN
0.0000 ug/kg/min | INTRAVENOUS | Status: DC
  Filled 2016-07-13: qty 250

## 2016-07-13 MED ORDER — METOPROLOL TARTRATE 12.5 MG HALF TABLET
12.5000 mg | ORAL_TABLET | Freq: Once | ORAL | Status: AC
  Administered 2016-07-14: 12.5 mg via ORAL
  Filled 2016-07-13: qty 1

## 2016-07-13 NOTE — Progress Notes (Signed)
Patient Name: Tommy Sullivan Date of Encounter: 07/13/2016  Hospital Problem List     Active Problems:   Unstable angina [facility])    Patient Profile     Tommy Sullivan is a 70 y.o. male with coronary artery disease and atrial flutter status post ablation.  He has known history of coronary artery disease with previous myocardial infarction  in 2009 with PCI done by Dr. Collene Mares. He has other chronic medical conditions that include type 2 diabetes, hypertension and hyperlipidemia.  A recent nuclear stress test which showed evidence of prior inferior and inferolateral infarct with ejection fraction of 34%.   Subjective   No chest pain.  Breathing OK.   Inpatient Medications    . aspirin EC  81 mg Oral Daily  . fenofibrate  160 mg Oral Daily  . insulin aspart  0-15 Units Subcutaneous TID WC  . insulin aspart  0-5 Units Subcutaneous QHS  . insulin glargine  18 Units Subcutaneous BID  . latanoprost  1 drop Both Eyes QHS  . nebivolol  10 mg Oral Daily  . rosuvastatin  10 mg Oral QHS  . tamsulosin  0.4 mg Oral QPC supper    Vital Signs    Vitals:   07/12/16 0839 07/12/16 1455 07/12/16 2124 07/13/16 0623  BP:  114/80 131/86 119/84  Pulse:  60 (!) 57 (!) 54  Resp:  19 19 18   Temp:  97.7 F (36.5 C) 98.1 F (36.7 C) 97.6 F (36.4 C)  TempSrc:  Oral Oral Oral  SpO2:  100% 99% 99%  Weight: 236 lb 1.8 oz (107.1 kg)   233 lb 9.6 oz (106 kg)  Height:        Intake/Output Summary (Last 24 hours) at 07/13/16 1025 Last data filed at 07/13/16 0732  Gross per 24 hour  Intake              720 ml  Output             3050 ml  Net            -2330 ml     Filed Weights   07/11/16 0500 07/12/16 0839 07/13/16 0623  Weight: 237 lb 11.2 oz (107.8 kg) 236 lb 1.8 oz (107.1 kg) 233 lb 9.6 oz (106 kg)    Physical Exam    GEN: Well nourished, well developed, in no acute distress.  Neck: Supple, no JVD, carotid bruits, or masses. Cardiac: RRR, no rubs, or gallops. No clubbing,  cyanosis, trace edema.  Radials/DP/PT 2+ and equal bilaterally.  Respiratory:  Respirations  regular and unlabored, clear to auscultation bilaterally. GI: Soft, nontender, nondistended, BS + x 4. Neuro:  Strength and sensation are intact.   Labs    CBC  Recent Labs  07/12/16 0208 07/13/16 0329  WBC 7.6 7.8  HGB 13.7 12.8*  HCT 42.3 39.7  MCV 90.8 90.0  PLT 121* [id number]*   Basic Metabolic Panel  Recent Labs  07/11/16 0228 07/12/16 0208  NA 135 136  K 3.8 4.3  CL 101 98*  CO2 25 29  GLUCOSE 185* 202*  BUN 13 15  CREATININE 1.07 0.94  CALCIUM 9.2 10.2   Liver Function Tests  Recent Labs  07/11/16 0228  AST 21  ALT 18  ALKPHOS 31*  BILITOT 0.7  PROT 6.2*  ALBUMIN 3.6   No results for input(s): LIPASE, AMYLASE in the last 72 hours. Cardiac Enzymes No results for input(s): CKTOTAL, CKMB, CKMBINDEX, TROPONINI in  the last 72 hours. BNP Invalid input(s): POCBNP D-Dimer No results for input(s): DDIMER in the last 72 hours. Hemoglobin A1C  Recent Labs  07/11/16 0228  HGBA1C 8.8*   Fasting Lipid Panel No results for input(s): CHOL, HDL, LDLCALC, TRIG, CHOLHDL, LDLDIRECT in the last 72 hours. Thyroid Function Tests  Recent Labs  07/11/16 0228  TSH 3.712     ECHO    - Left ventricle: The cavity size was normal. Wall thickness was   normal. Systolic function was mildly reduced. The estimated   ejection fraction was in the range of 45% to 50%. There is   akinesis of the basalinferior myocardium. - Aortic valve: Trileaflet; mildly thickened, mildly calcified   leaflets. - Left atrium: The atrium was moderately dilated. - Tricuspid valve: There was mild regurgitation.  Radiology    Dg Chest 2 View  Result Date: 07/11/2016 CLINICAL DATA:  Angina.  Cough.  Coronary artery disease. EXAM: CHEST  2 VIEW COMPARISON:  12/24/2015 FINDINGS: The heart is mildly enlarged. There is aortic atherosclerosis. There is pulmonary venous hypertension with mild  interstitial edema. Small effusions in the posterior costophrenic angles. No focal pulmonary lesion. No bone abnormality. IMPRESSION: Aortic atherosclerosis. Cardiomegaly. Interstitial pulmonary edema and small effusions. Electronically Signed   By: Nelson Chimes M.D.   On: 07/11/2016 09:35   Nm Myocar Multi W/spect W/wall Motion / Ef  Result Date: 06/29/2016 Pharmacological myocardial perfusion imaging study with no significant ischemia Large region of fixed perfusion defect in the inferior and inferolateral wall consistent with previous MI, though significant GI uptake artifact noted. Improved perfusion noted on attenuation corrected images though still with moderate fixed perfusion defect noted inferior and inferolateral wall. Lateral wall motion abnormality noted, EF estimated at 34% No EKG changes concerning for ischemia at peak stress or in recovery. Moderate risk scan Signed, Esmond Plants, MD, Ph.D [facility]    Assessment & Plan     Unstable angina: Cardiac catheterization showed severe three-vessel coronary artery disease with mildly reduced LV systolic function.  Echo EF mildly reduced.  I discussed the reduced EF with the patient.  CABG is planned for Friday.    Acute on chronic systolic heart failure: His LVEDP was moderately elevated. He had good UO yesterday and did not get PO diuretic.  Seems to be euvolemic.  No change in therapy.    Status post atrial flutter ablation:  Maintaining NSR.   Hyperlipidemia: Continue treatment with rosuvastatin.  Type 2 diabetes:   Hold metformin. Sliding scale coverage.  Sleep apnea:    On CPAP.   Signed, Minus Breeding, MD  07/13/2016, 10:25 AM

## 2016-07-13 NOTE — Progress Notes (Signed)
ANTICOAGULATION CONSULT NOTE  Pharmacy Consult for Heparin Indication: CAD pending CABG  No Known Allergies  Patient Measurements: Height: 5' 9.5" (176.5 cm) Weight: 233 lb 9.6 oz (106 kg) IBW/kg (Calculated) : 71.85 Heparin Dosing Weight: 95 kg  Vital Signs: Temp: 97.6 F (36.4 C) (10/12 0623) Temp Source: Oral (10/12 0623) BP: 119/84 (10/12 0623) Pulse Rate: 54 (10/12 0623)  Labs:  Recent Labs  07/10/16 1618 07/10/16 1745 07/11/16 0228  07/11/16 1821 07/12/16 0208 07/13/16 0329  HGB 12.1*  --  12.0*  --   --  13.7 12.8*  HCT 37.7*  --  37.6*  --   --  42.3 39.7  PLT 103*  --  110*  --   --  121* 117*  APTT  --  28  --   --   --   --   --   LABPROT  --  15.6*  --   --   --   --   --   INR  --  1.23  --   --   --   --   --   HEPARINUNFRC  --   --  0.20*  < > 0.33 0.42 0.46  CREATININE 1.04  --  1.07  --   --  0.94  --   < > = values in this interval not displayed.  Estimated Creatinine Clearance: 88.4 mL/min (by C-G formula based on SCr of 0.94 mg/dL).   Medications:  Scheduled:  . aspirin EC  81 mg Oral Daily  . fenofibrate  160 mg Oral Daily  . insulin aspart  0-15 Units Subcutaneous TID WC  . insulin aspart  0-5 Units Subcutaneous QHS  . insulin glargine  18 Units Subcutaneous BID  . latanoprost  1 drop Both Eyes QHS  . nebivolol  10 mg Oral Daily  . rosuvastatin  10 mg Oral QHS  . tamsulosin  0.4 mg Oral QPC supper   Infusions:  . heparin 1,650 Units/hr (07/12/16 2058)    Assessment: 70 yo male who had a cath at [facility]. Cath showed 3 vessel coronary disease. Tommy Sullivan will have a CABG on Friday 10/13. Until then, he remains on a heparin infusion.  -heparin level= 0.46, plt= 117 and stable    Goal of Therapy:  Heparin level 0.3-0.7 units/ml Monitor platelets by anticoagulation protocol: Yes    Plan:  Continue heparin at 1650 units/hr Heparin level and CBC daily while on heparin  Tommy Sullivan, Pharm D 07/13/2016 8:54 AM

## 2016-07-14 ENCOUNTER — Encounter (HOSPITAL_COMMUNITY)
Admission: AD | Disposition: A | Discharge status: Home / Self Care | Payer: Self-pay | Source: Other Acute Inpatient Hospital | Attending: Cardiothoracic Surgery

## 2016-07-14 ENCOUNTER — Inpatient Hospital Stay (HOSPITAL_COMMUNITY): Payer: 59 | Admitting: Certified Registered Nurse Anesthetist

## 2016-07-14 ENCOUNTER — Inpatient Hospital Stay (HOSPITAL_COMMUNITY): Payer: 59

## 2016-07-14 DIAGNOSIS — Z951 Presence of aortocoronary bypass graft: Secondary | ICD-10-CM

## 2016-07-14 DIAGNOSIS — I25110 Atherosclerotic heart disease of native coronary artery with unstable angina pectoris: Secondary | ICD-10-CM

## 2016-07-14 HISTORY — PX: TEE WITHOUT CARDIOVERSION: SHX5443

## 2016-07-14 HISTORY — PX: CORONARY ARTERY BYPASS GRAFT: SHX141

## 2016-07-14 LAB — POCT I-STAT 3, ART BLOOD GAS (G3+)
ACID-BASE EXCESS: 1 mmol/L (ref 0.0–2.0)
Acid-Base Excess: 3 mmol/L — ABNORMAL HIGH (ref 0.0–2.0)
Acid-Base Excess: 5 mmol/L — ABNORMAL HIGH (ref 0.0–2.0)
Acid-Base Excess: 1 mmol/L (ref 0.0–2.0)
Acid-base deficit: 5 mmol/L — ABNORMAL HIGH (ref 0.0–2.0)
BICARBONATE: 31 mmol/L — AB (ref 20.0–28.0)
BICARBONATE: 26.2 mmol/L (ref 20.0–28.0)
BICARBONATE: 20.1 mmol/L (ref 20.0–28.0)
BICARBONATE: 25.8 mmol/L (ref 20.0–28.0)
BICARBONATE: 25.9 mmol/L (ref 20.0–28.0)
Bicarbonate: 29.1 mmol/L — ABNORMAL HIGH (ref 20.0–28.0)
O2 SAT: 100 %
O2 SAT: 96 %
O2 Saturation: 100 %
O2 Saturation: 100 %
O2 Saturation: 97 %
O2 Saturation: 95 %
PCO2 ART: 51.6 mmHg — AB (ref 32.0–48.0)
PCO2 ART: 51.7 mmHg — AB (ref 32.0–48.0)
PCO2 ART: 36.3 mmHg (ref 32.0–48.0)
PCO2 ART: 40.8 mmHg (ref 32.0–48.0)
PCO2 ART: 40.9 mmHg (ref 32.0–48.0)
PH ART: 7.359 (ref 7.350–7.450)
PH ART: 7.386 (ref 7.350–7.450)
PH ART: 7.347 — AB (ref 7.350–7.450)
PH ART: 7.407 (ref 7.350–7.450)
PO2 ART: 282 mmHg — AB (ref 83.0–108.0)
PO2 ART: 94 mmHg (ref 83.0–108.0)
PO2 ART: 72 mmHg — AB (ref 83.0–108.0)
Patient temperature: 36.1
Patient temperature: 36.2
TCO2: 31 mmol/L (ref 0–100)
TCO2: 33 mmol/L (ref 0–100)
TCO2: 28 mmol/L (ref 0–100)
TCO2: 21 mmol/L (ref 0–100)
TCO2: 27 mmol/L (ref 0–100)
TCO2: 27 mmol/L (ref 0–100)
pCO2 arterial: 46.9 mmHg (ref 32.0–48.0)
pH, Arterial: 7.355 (ref 7.350–7.450)
pH, Arterial: 7.405 (ref 7.350–7.450)
pO2, Arterial: 474 mmHg — ABNORMAL HIGH (ref 83.0–108.0)
pO2, Arterial: 407 mmHg — ABNORMAL HIGH (ref 83.0–108.0)
pO2, Arterial: 79 mmHg — ABNORMAL LOW (ref 83.0–108.0)

## 2016-07-14 LAB — CBC
HCT: 39.4 % (ref 39.0–52.0)
HCT: 31.6 % — ABNORMAL LOW (ref 39.0–52.0)
HEMATOCRIT: 33.5 % — AB (ref 39.0–52.0)
Hemoglobin: 13.1 g/dL (ref 13.0–17.0)
Hemoglobin: 11 g/dL — ABNORMAL LOW (ref 13.0–17.0)
Hemoglobin: 10.5 g/dL — ABNORMAL LOW (ref 13.0–17.0)
MCH: 29.8 pg (ref 26.0–34.0)
MCH: 29.3 pg (ref 26.0–34.0)
MCH: 29.5 pg (ref 26.0–34.0)
MCHC: 33.2 g/dL (ref 30.0–36.0)
MCHC: 32.8 g/dL (ref 30.0–36.0)
MCHC: 33.2 g/dL (ref 30.0–36.0)
MCV: 89.7 fL (ref 78.0–100.0)
MCV: 89.3 fL (ref 78.0–100.0)
MCV: 88.8 fL (ref 78.0–100.0)
PLATELETS: 118 10*3/uL — AB (ref 150–400)
PLATELETS: 102 10*3/uL — AB (ref 150–400)
Platelets: 101 10*3/uL — ABNORMAL LOW (ref 150–400)
RBC: 4.39 MIL/uL (ref 4.22–5.81)
RBC: 3.75 MIL/uL — AB (ref 4.22–5.81)
RBC: 3.56 MIL/uL — ABNORMAL LOW (ref 4.22–5.81)
RDW: 13.9 % (ref 11.5–15.5)
RDW: 13.8 % (ref 11.5–15.5)
RDW: 13.8 % (ref 11.5–15.5)
WBC: 8 10*3/uL (ref 4.0–10.5)
WBC: 15.2 10*3/uL — AB (ref 4.0–10.5)
WBC: 13.2 10*3/uL — ABNORMAL HIGH (ref 4.0–10.5)

## 2016-07-14 LAB — HEMOGLOBIN AND HEMATOCRIT, BLOOD
HCT: 29.5 % — ABNORMAL LOW (ref 39.0–52.0)
Hemoglobin: 9.7 g/dL — ABNORMAL LOW (ref 13.0–17.0)

## 2016-07-14 LAB — BASIC METABOLIC PANEL
Anion gap: 7 (ref 5–15)
BUN: 16 mg/dL (ref 6–20)
CO2: 30 mmol/L (ref 22–32)
Calcium: 10.3 mg/dL (ref 8.9–10.3)
Chloride: 102 mmol/L (ref 101–111)
Creatinine, Ser: 1.13 mg/dL (ref 0.61–1.24)
GFR calc Af Amer: >60 mL/min (ref >60)
GFR calc non Af Amer: >60 mL/min (ref >60)
Glucose, Bld: 118 mg/dL — ABNORMAL HIGH (ref 65–99)
Potassium: 4.4 mmol/L (ref 3.5–5.1)
Sodium: 139 mmol/L (ref 135–145)

## 2016-07-14 LAB — POCT I-STAT, CHEM 8
BUN: 17 mg/dL (ref 6–20)
BUN: 17 mg/dL (ref 6–20)
BUN: 15 mg/dL (ref 6–20)
BUN: 15 mg/dL (ref 6–20)
BUN: 15 mg/dL (ref 6–20)
BUN: 14 mg/dL (ref 6–20)
CALCIUM ION: 1.36 mmol/L (ref 1.15–1.40)
CALCIUM ION: 1.19 mmol/L (ref 1.15–1.40)
CALCIUM ION: 1.13 mmol/L — AB (ref 1.15–1.40)
CHLORIDE: 102 mmol/L (ref 101–111)
CHLORIDE: 99 mmol/L — AB (ref 101–111)
CHLORIDE: 98 mmol/L — AB (ref 101–111)
CHLORIDE: 100 mmol/L — AB (ref 101–111)
CHLORIDE: 102 mmol/L (ref 101–111)
CREATININE: 0.7 mg/dL (ref 0.61–1.24)
CREATININE: 0.8 mg/dL (ref 0.61–1.24)
CREATININE: 0.9 mg/dL (ref 0.61–1.24)
Calcium, Ion: 1.3 mmol/L (ref 1.15–1.40)
Calcium, Ion: 1.15 mmol/L (ref 1.15–1.40)
Calcium, Ion: 1.12 mmol/L — ABNORMAL LOW (ref 1.15–1.40)
Chloride: 100 mmol/L — ABNORMAL LOW (ref 101–111)
Creatinine, Ser: 0.7 mg/dL (ref 0.61–1.24)
Creatinine, Ser: 0.8 mg/dL (ref 0.61–1.24)
Creatinine, Ser: 0.8 mg/dL (ref 0.61–1.24)
GLUCOSE: 142 mg/dL — AB (ref 65–99)
GLUCOSE: 168 mg/dL — AB (ref 65–99)
Glucose, Bld: 127 mg/dL — ABNORMAL HIGH (ref 65–99)
Glucose, Bld: 137 mg/dL — ABNORMAL HIGH (ref 65–99)
Glucose, Bld: 185 mg/dL — ABNORMAL HIGH (ref 65–99)
Glucose, Bld: 224 mg/dL — ABNORMAL HIGH (ref 65–99)
HCT: 40 % (ref 39.0–52.0)
HCT: 37 % — ABNORMAL LOW (ref 39.0–52.0)
HCT: 33 % — ABNORMAL LOW (ref 39.0–52.0)
HCT: 30 % — ABNORMAL LOW (ref 39.0–52.0)
HCT: 33 % — ABNORMAL LOW (ref 39.0–52.0)
HEMATOCRIT: 32 % — AB (ref 39.0–52.0)
Hemoglobin: 13.6 g/dL (ref 13.0–17.0)
Hemoglobin: 12.6 g/dL — ABNORMAL LOW (ref 13.0–17.0)
Hemoglobin: 11.2 g/dL — ABNORMAL LOW (ref 13.0–17.0)
Hemoglobin: 10.2 g/dL — ABNORMAL LOW (ref 13.0–17.0)
Hemoglobin: 10.9 g/dL — ABNORMAL LOW (ref 13.0–17.0)
Hemoglobin: 11.2 g/dL — ABNORMAL LOW (ref 13.0–17.0)
POTASSIUM: 4.3 mmol/L (ref 3.5–5.1)
POTASSIUM: 4.2 mmol/L (ref 3.5–5.1)
POTASSIUM: 4.2 mmol/L (ref 3.5–5.1)
POTASSIUM: 4 mmol/L (ref 3.5–5.1)
Potassium: 3.9 mmol/L (ref 3.5–5.1)
Potassium: 4 mmol/L (ref 3.5–5.1)
SODIUM: 140 mmol/L (ref 135–145)
SODIUM: 139 mmol/L (ref 135–145)
SODIUM: 135 mmol/L (ref 135–145)
SODIUM: 139 mmol/L (ref 135–145)
Sodium: 140 mmol/L (ref 135–145)
Sodium: 137 mmol/L (ref 135–145)
TCO2: 31 mmol/L (ref 0–100)
TCO2: 29 mmol/L (ref 0–100)
TCO2: 29 mmol/L (ref 0–100)
TCO2: 27 mmol/L (ref 0–100)
TCO2: 27 mmol/L (ref 0–100)
TCO2: 23 mmol/L (ref 0–100)

## 2016-07-14 LAB — APTT: APTT: 31 s (ref 24–36)

## 2016-07-14 LAB — GLUCOSE, CAPILLARY
GLUCOSE-CAPILLARY: 184 mg/dL — AB (ref 65–99)
Glucose-Capillary: 178 mg/dL — ABNORMAL HIGH (ref 65–99)
Glucose-Capillary: 164 mg/dL — ABNORMAL HIGH (ref 65–99)

## 2016-07-14 LAB — HEPARIN LEVEL (UNFRACTIONATED): HEPARIN UNFRACTIONATED: 0.38 [IU]/mL (ref 0.30–0.70)

## 2016-07-14 LAB — MAGNESIUM: Magnesium: 2.7 mg/dL — ABNORMAL HIGH (ref 1.7–2.4)

## 2016-07-14 LAB — POCT I-STAT 4, (NA,K, GLUC, HGB,HCT)
Glucose, Bld: 195 mg/dL — ABNORMAL HIGH (ref 65–99)
HCT: 35 % — ABNORMAL LOW (ref 39.0–52.0)
HEMOGLOBIN: 11.9 g/dL — AB (ref 13.0–17.0)
Potassium: 4.3 mmol/L (ref 3.5–5.1)
Sodium: 140 mmol/L (ref 135–145)

## 2016-07-14 LAB — PROTIME-INR
INR: 1.44
Prothrombin Time: 17.6 seconds — ABNORMAL HIGH (ref 11.4–15.2)

## 2016-07-14 LAB — CREATININE, SERUM
Creatinine, Ser: 1.1 mg/dL (ref 0.61–1.24)
GFR calc Af Amer: >60 mL/min (ref >60)
GFR calc non Af Amer: >60 mL/min (ref >60)

## 2016-07-14 LAB — PLATELET COUNT: Platelets: 103 10*3/uL — ABNORMAL LOW (ref 150–400)

## 2016-07-14 SURGERY — CORONARY ARTERY BYPASS GRAFTING (CABG)
Anesthesia: General | Site: Chest

## 2016-07-14 MED ORDER — ORAL CARE MOUTH RINSE
15.0000 mL | OROMUCOSAL | Status: DC
  Administered 2016-07-14 – 2016-07-15 (×6): 15 mL via OROMUCOSAL

## 2016-07-14 MED ORDER — DOCUSATE SODIUM 100 MG PO CAPS
200.0000 mg | ORAL_CAPSULE | Freq: Every day | ORAL | Status: DC
  Administered 2016-07-15 – 2016-07-23 (×8): 200 mg via ORAL
  Filled 2016-07-14 (×8): qty 2

## 2016-07-14 MED ORDER — ACETAMINOPHEN 500 MG PO TABS
1000.0000 mg | ORAL_TABLET | Freq: Four times a day (QID) | ORAL | Status: AC
Start: 2016-07-15 — End: 2016-07-19
  Administered 2016-07-15 – 2016-07-19 (×16): 1000 mg via ORAL
  Filled 2016-07-14 (×17): qty 2

## 2016-07-14 MED ORDER — SODIUM CHLORIDE 0.9 % IV SOLN
INTRAVENOUS | Status: DC
  Administered 2016-07-14: 7.3 [IU]/h via INTRAVENOUS
  Filled 2016-07-14: qty 2.5

## 2016-07-14 MED ORDER — VANCOMYCIN HCL IN DEXTROSE 1-5 GM/200ML-% IV SOLN
1000.0000 mg | Freq: Once | INTRAVENOUS | Status: AC
  Administered 2016-07-14: 1000 mg via INTRAVENOUS
  Filled 2016-07-14 (×2): qty 200

## 2016-07-14 MED ORDER — ASPIRIN EC 325 MG PO TBEC
325.0000 mg | DELAYED_RELEASE_TABLET | Freq: Every day | ORAL | Status: DC
  Administered 2016-07-15 – 2016-07-23 (×9): 325 mg via ORAL
  Filled 2016-07-14 (×9): qty 1

## 2016-07-14 MED ORDER — POTASSIUM CHLORIDE 10 MEQ/50ML IV SOLN: 10.0000 meq | INTRAVENOUS | Status: AC

## 2016-07-14 MED ORDER — SODIUM CHLORIDE 0.9 % IV SOLN
20.0000 ug | INTRAVENOUS | Status: AC
  Administered 2016-07-14: 20 ug via INTRAVENOUS
  Filled 2016-07-14: qty 5

## 2016-07-14 MED ORDER — OXYCODONE HCL 5 MG PO TABS
5.0000 mg | ORAL_TABLET | ORAL | Status: DC | PRN
  Administered 2016-07-15 – 2016-07-16 (×4): 10 mg via ORAL
  Filled 2016-07-14 (×4): qty 2

## 2016-07-14 MED ORDER — METOPROLOL TARTRATE 25 MG/10 ML ORAL SUSPENSION: 12.5000 mg | Freq: Two times a day (BID) | ORAL | Status: DC

## 2016-07-14 MED ORDER — MORPHINE SULFATE (PF) 2 MG/ML IV SOLN
1.0000 mg | INTRAVENOUS | Status: DC | PRN
  Administered 2016-07-14: 2 mg via INTRAVENOUS
  Filled 2016-07-14 (×2): qty 1

## 2016-07-14 MED ORDER — METOCLOPRAMIDE HCL 5 MG/ML IJ SOLN: 10.0000 mg | Freq: Four times a day (QID) | INTRAMUSCULAR | Status: DC

## 2016-07-14 MED ORDER — GELATIN ABSORBABLE MT POWD
OROMUCOSAL | Status: DC | PRN
  Administered 2016-07-14 (×3): 4 mL via TOPICAL

## 2016-07-14 MED ORDER — MORPHINE SULFATE (PF) 2 MG/ML IV SOLN
2.0000 mg | INTRAVENOUS | Status: DC | PRN
  Administered 2016-07-14: 2 mg via INTRAVENOUS
  Administered 2016-07-15: 4 mg via INTRAVENOUS
  Administered 2016-07-15 – 2016-07-16 (×9): 2 mg via INTRAVENOUS
  Filled 2016-07-14 (×5): qty 1
  Filled 2016-07-14: qty 2
  Filled 2016-07-14 (×3): qty 1
  Filled 2016-07-14: qty 2
  Filled 2016-07-14 (×2): qty 1

## 2016-07-14 MED ORDER — SODIUM CHLORIDE 0.9 % IV SOLN
INTRAVENOUS | Status: DC | PRN
  Administered 2016-07-14: 14:00:00 via INTRAVENOUS

## 2016-07-14 MED ORDER — TRAMADOL HCL 50 MG PO TABS
50.0000 mg | ORAL_TABLET | ORAL | Status: DC | PRN
  Administered 2016-07-17: 100 mg via ORAL
  Administered 2016-07-18 (×2): 50 mg via ORAL
  Administered 2016-07-19 – 2016-07-20 (×3): 100 mg via ORAL
  Filled 2016-07-14: qty 2
  Filled 2016-07-14 (×2): qty 1
  Filled 2016-07-14 (×3): qty 2

## 2016-07-14 MED ORDER — TRANEXAMIC ACID 1000 MG/10ML IV SOLN
INTRAVENOUS | Status: DC | PRN
  Administered 2016-07-14: 1.5 mg/kg/h via INTRAVENOUS

## 2016-07-14 MED ORDER — BISACODYL 10 MG RE SUPP: 10.0000 mg | Freq: Every day | RECTAL | Status: DC

## 2016-07-14 MED ORDER — LIDOCAINE HCL (PF) 2 % IJ SOLN
100.0000 mg | Freq: Once | INTRAMUSCULAR | Status: AC
  Administered 2016-07-14: 100 mg

## 2016-07-14 MED ORDER — DEXTROSE 5 % IV SOLN
0.0000 ug/min | INTRAVENOUS | Status: DC
  Administered 2016-07-14: 75 ug/min via INTRAVENOUS
  Administered 2016-07-14: 95 ug/min via INTRAVENOUS
  Filled 2016-07-14 (×2): qty 2

## 2016-07-14 MED ORDER — PHENYLEPHRINE 40 MCG/ML (10ML) SYRINGE FOR IV PUSH (FOR BLOOD PRESSURE SUPPORT)
PREFILLED_SYRINGE | INTRAVENOUS | Status: AC
  Filled 2016-07-14: qty 10

## 2016-07-14 MED ORDER — ROCURONIUM BROMIDE 10 MG/ML (PF) SYRINGE
PREFILLED_SYRINGE | INTRAVENOUS | Status: AC
  Filled 2016-07-14: qty 10

## 2016-07-14 MED ORDER — ACETAMINOPHEN 160 MG/5ML PO SOLN: 650.0000 mg | Freq: Once | ORAL | Status: AC

## 2016-07-14 MED ORDER — DEXMEDETOMIDINE HCL IN NACL 400 MCG/100ML IV SOLN
INTRAVENOUS | Status: DC | PRN
  Administered 2016-07-14: 0.7 ug/kg/h via INTRAVENOUS

## 2016-07-14 MED ORDER — INSULIN REGULAR BOLUS VIA INFUSION
0.0000 [IU] | Freq: Three times a day (TID) | INTRAVENOUS | Status: DC
  Filled 2016-07-14: qty 10

## 2016-07-14 MED ORDER — BISACODYL 5 MG PO TBEC
10.0000 mg | DELAYED_RELEASE_TABLET | Freq: Every day | ORAL | Status: DC
  Administered 2016-07-15 – 2016-07-23 (×5): 10 mg via ORAL
  Filled 2016-07-14 (×7): qty 2

## 2016-07-14 MED ORDER — TRANEXAMIC ACID 1000 MG/10ML IV SOLN
INTRAVENOUS | Status: DC | PRN
  Administered 2016-07-14: 1565 mg via INTRAVENOUS

## 2016-07-14 MED ORDER — ROSUVASTATIN CALCIUM 10 MG PO TABS
10.0000 mg | ORAL_TABLET | Freq: Every day | ORAL | Status: DC
  Administered 2016-07-15 – 2016-07-22 (×8): 10 mg via ORAL
  Filled 2016-07-14 (×8): qty 1

## 2016-07-14 MED ORDER — DEXMEDETOMIDINE HCL IN NACL 200 MCG/50ML IV SOLN
0.0000 ug/kg/h | INTRAVENOUS | Status: DC
  Filled 2016-07-14: qty 50

## 2016-07-14 MED ORDER — ASPIRIN 81 MG PO CHEW: 324.0000 mg | CHEWABLE_TABLET | Freq: Every day | ORAL | Status: DC

## 2016-07-14 MED ORDER — SODIUM CHLORIDE 0.9% FLUSH: 3.0000 mL | INTRAVENOUS | Status: DC | PRN

## 2016-07-14 MED ORDER — MILRINONE LACTATE IN DEXTROSE 20-5 MG/100ML-% IV SOLN
0.1250 ug/kg/min | INTRAVENOUS | Status: DC
  Filled 2016-07-14: qty 100

## 2016-07-14 MED ORDER — ACETAMINOPHEN 160 MG/5ML PO SOLN
1000.0000 mg | Freq: Four times a day (QID) | ORAL | Status: DC
Start: 2016-07-15 — End: 2016-07-19

## 2016-07-14 MED ORDER — METOPROLOL TARTRATE 12.5 MG HALF TABLET
12.5000 mg | ORAL_TABLET | Freq: Two times a day (BID) | ORAL | Status: DC
  Administered 2016-07-15 – 2016-07-22 (×16): 12.5 mg via ORAL
  Filled 2016-07-14 (×17): qty 1

## 2016-07-14 MED ORDER — TRANEXAMIC ACID 1000 MG/10ML IV SOLN
INTRAVENOUS | Status: DC
  Filled 2016-07-14: qty 10

## 2016-07-14 MED ORDER — LACTATED RINGERS IV SOLN
INTRAVENOUS | Status: DC | PRN
  Administered 2016-07-14 (×2): via INTRAVENOUS

## 2016-07-14 MED ORDER — SODIUM BICARBONATE 8.4 % IV SOLN
50.0000 meq | Freq: Once | INTRAVENOUS | Status: AC
  Administered 2016-07-14: 50 meq via INTRAVENOUS

## 2016-07-14 MED ORDER — AMIODARONE HCL IN DEXTROSE 360-4.14 MG/200ML-% IV SOLN
30.0000 mg/h | INTRAVENOUS | Status: DC
  Filled 2016-07-14: qty 200

## 2016-07-14 MED ORDER — PLASMA-LYTE 148 IV SOLN
INTRAVENOUS | Status: DC | PRN
  Administered 2016-07-14: 500 mL via INTRAVASCULAR

## 2016-07-14 MED ORDER — LACTATED RINGERS IV SOLN: INTRAVENOUS | Status: DC

## 2016-07-14 MED ORDER — LACTATED RINGERS IV SOLN: 500.0000 mL | Freq: Once | INTRAVENOUS | Status: DC | PRN

## 2016-07-14 MED ORDER — DEXTROSE 5 % IV SOLN
INTRAVENOUS | Status: DC | PRN
  Administered 2016-07-14: .75 g via INTRAVENOUS
  Administered 2016-07-14: 1.5 g via INTRAVENOUS

## 2016-07-14 MED ORDER — MIDAZOLAM HCL 5 MG/5ML IJ SOLN
INTRAMUSCULAR | Status: DC | PRN
  Administered 2016-07-14: 5 mg via INTRAVENOUS
  Administered 2016-07-14: 3 mg via INTRAVENOUS
  Administered 2016-07-14: 2 mg via INTRAVENOUS

## 2016-07-14 MED ORDER — GLYCOPYRROLATE 0.2 MG/ML IV SOSY
PREFILLED_SYRINGE | INTRAVENOUS | Status: AC
  Filled 2016-07-14: qty 3

## 2016-07-14 MED ORDER — MAGNESIUM SULFATE 4 GM/100ML IV SOLN
4.0000 g | Freq: Once | INTRAVENOUS | Status: AC
  Administered 2016-07-14: 4 g via INTRAVENOUS
  Filled 2016-07-14: qty 100

## 2016-07-14 MED ORDER — ARTIFICIAL TEARS OP OINT
TOPICAL_OINTMENT | OPHTHALMIC | Status: AC
  Filled 2016-07-14: qty 3.5

## 2016-07-14 MED ORDER — FAMOTIDINE IN NACL 20-0.9 MG/50ML-% IV SOLN
20.0000 mg | Freq: Two times a day (BID) | INTRAVENOUS | Status: AC
  Administered 2016-07-14 – 2016-07-15 (×2): 20 mg via INTRAVENOUS
  Filled 2016-07-14: qty 50

## 2016-07-14 MED ORDER — PHENYLEPHRINE HCL 10 MG/ML IJ SOLN
INTRAMUSCULAR | Status: AC
  Filled 2016-07-14: qty 2

## 2016-07-14 MED ORDER — ROCURONIUM BROMIDE 10 MG/ML (PF) SYRINGE
PREFILLED_SYRINGE | INTRAVENOUS | Status: DC | PRN
  Administered 2016-07-14 (×5): 50 mg via INTRAVENOUS

## 2016-07-14 MED ORDER — SODIUM CHLORIDE 0.9% FLUSH
3.0000 mL | Freq: Two times a day (BID) | INTRAVENOUS | Status: DC
  Administered 2016-07-15 – 2016-07-22 (×15): 3 mL via INTRAVENOUS

## 2016-07-14 MED ORDER — SODIUM CHLORIDE 0.9 % IV SOLN: INTRAVENOUS | Status: DC

## 2016-07-14 MED ORDER — NITROGLYCERIN IN D5W 200-5 MCG/ML-% IV SOLN: 0.0000 ug/min | INTRAVENOUS | Status: DC

## 2016-07-14 MED ORDER — FENTANYL CITRATE (PF) 250 MCG/5ML IJ SOLN
INTRAMUSCULAR | Status: AC
  Filled 2016-07-14: qty 5

## 2016-07-14 MED ORDER — PROPOFOL 10 MG/ML IV BOLUS
INTRAVENOUS | Status: DC | PRN
  Administered 2016-07-14: 50 mg via INTRAVENOUS

## 2016-07-14 MED ORDER — HEMOSTATIC AGENTS (NO CHARGE) OPTIME
TOPICAL | Status: DC | PRN
  Administered 2016-07-14 (×2): 1 via TOPICAL

## 2016-07-14 MED ORDER — PHENYLEPHRINE HCL 10 MG/ML IJ SOLN
0.0000 ug/min | INTRAMUSCULAR | Status: DC
  Administered 2016-07-15: 95 ug/min via INTRAVENOUS
  Filled 2016-07-14: qty 4

## 2016-07-14 MED ORDER — DOPAMINE-DEXTROSE 3.2-5 MG/ML-% IV SOLN
INTRAVENOUS | Status: DC | PRN
  Administered 2016-07-14: 3 ug/kg/min via INTRAVENOUS

## 2016-07-14 MED ORDER — AMIODARONE HCL IN DEXTROSE 360-4.14 MG/200ML-% IV SOLN
INTRAVENOUS | Status: AC
  Filled 2016-07-14: qty 200

## 2016-07-14 MED ORDER — PROPOFOL 10 MG/ML IV BOLUS
INTRAVENOUS | Status: AC
  Filled 2016-07-14: qty 20

## 2016-07-14 MED ORDER — DOPAMINE-DEXTROSE 3.2-5 MG/ML-% IV SOLN: 0.0000 ug/kg/min | INTRAVENOUS | Status: DC

## 2016-07-14 MED ORDER — 0.9 % SODIUM CHLORIDE (POUR BTL) OPTIME
TOPICAL | Status: DC | PRN
  Administered 2016-07-14: 6000 mL

## 2016-07-14 MED ORDER — MIDAZOLAM HCL 2 MG/2ML IJ SOLN: 2.0000 mg | INTRAMUSCULAR | Status: DC | PRN

## 2016-07-14 MED ORDER — MILRINONE LACTATE IN DEXTROSE 20-5 MG/100ML-% IV SOLN
INTRAVENOUS | Status: DC | PRN
  Administered 2016-07-14: .3 ug/kg/min via INTRAVENOUS

## 2016-07-14 MED ORDER — AMIODARONE HCL IN DEXTROSE 360-4.14 MG/200ML-% IV SOLN
60.0000 mg/h | INTRAVENOUS | Status: DC
  Administered 2016-07-14 (×2): 60 mg/h via INTRAVENOUS
  Filled 2016-07-14: qty 200

## 2016-07-14 MED ORDER — ONDANSETRON HCL 4 MG/2ML IJ SOLN
4.0000 mg | Freq: Four times a day (QID) | INTRAMUSCULAR | Status: DC | PRN
  Administered 2016-07-15 (×2): 4 mg via INTRAVENOUS
  Filled 2016-07-14 (×2): qty 2

## 2016-07-14 MED ORDER — HEPARIN SODIUM (PORCINE) 1000 UNIT/ML IJ SOLN
INTRAMUSCULAR | Status: DC | PRN
  Administered 2016-07-14: 1000 [IU] via INTRAVENOUS
  Administered 2016-07-14: 5000 [IU] via INTRAVENOUS
  Administered 2016-07-14: 2000 [IU] via INTRAVENOUS
  Administered 2016-07-14: 28000 [IU] via INTRAVENOUS

## 2016-07-14 MED ORDER — PHENYLEPHRINE HCL 10 MG/ML IJ SOLN
INTRAVENOUS | Status: DC | PRN
  Administered 2016-07-14: 40 ug/min via INTRAVENOUS

## 2016-07-14 MED ORDER — METOCLOPRAMIDE HCL 5 MG/ML IJ SOLN
10.0000 mg | Freq: Four times a day (QID) | INTRAMUSCULAR | Status: DC
  Administered 2016-07-15 – 2016-07-21 (×23): 10 mg via INTRAVENOUS
  Filled 2016-07-14 (×25): qty 2

## 2016-07-14 MED ORDER — DEXTROSE 5 % IV SOLN
1.5000 g | Freq: Two times a day (BID) | INTRAVENOUS | Status: AC
  Administered 2016-07-14 – 2016-07-16 (×4): 1.5 g via INTRAVENOUS
  Filled 2016-07-14 (×4): qty 1.5

## 2016-07-14 MED ORDER — CHLORHEXIDINE GLUCONATE 0.12 % MT SOLN
15.0000 mL | OROMUCOSAL | Status: AC
  Administered 2016-07-14: 15 mL via OROMUCOSAL
  Filled 2016-07-14: qty 15

## 2016-07-14 MED ORDER — ANTITHROMBIN III (HUMAN) 500 UNITS IV SOLR
521.0000 [IU] | Freq: Once | INTRAVENOUS | Status: DC
  Filled 2016-07-14: qty 20

## 2016-07-14 MED ORDER — ALBUMIN HUMAN 5 % IV SOLN
250.0000 mL | INTRAVENOUS | Status: AC | PRN
  Administered 2016-07-14 (×3): 250 mL via INTRAVENOUS
  Filled 2016-07-14: qty 250

## 2016-07-14 MED ORDER — PROTAMINE SULFATE 10 MG/ML IV SOLN
INTRAVENOUS | Status: DC | PRN
  Administered 2016-07-14: 300 mg via INTRAVENOUS

## 2016-07-14 MED ORDER — ALBUMIN HUMAN 5 % IV SOLN
INTRAVENOUS | Status: DC | PRN
  Administered 2016-07-14 (×2): via INTRAVENOUS

## 2016-07-14 MED ORDER — LIDOCAINE 2% (20 MG/ML) 5 ML SYRINGE
INTRAMUSCULAR | Status: DC | PRN
  Administered 2016-07-14: 100 mg via INTRAVENOUS

## 2016-07-14 MED ORDER — ARTIFICIAL TEARS OP OINT
TOPICAL_OINTMENT | OPHTHALMIC | Status: DC | PRN
  Administered 2016-07-14: 1 via OPHTHALMIC

## 2016-07-14 MED ORDER — METOPROLOL TARTRATE 5 MG/5ML IV SOLN: 2.5000 mg | INTRAVENOUS | Status: DC | PRN

## 2016-07-14 MED ORDER — FENTANYL CITRATE (PF) 250 MCG/5ML IJ SOLN
INTRAMUSCULAR | Status: AC
  Filled 2016-07-14: qty 20

## 2016-07-14 MED ORDER — SODIUM CHLORIDE 0.9 % IV SOLN
INTRAVENOUS | Status: DC | PRN
  Administered 2016-07-14: 1250 mg via INTRAVENOUS

## 2016-07-14 MED ORDER — MILRINONE LACTATE IN DEXTROSE 20-5 MG/100ML-% IV SOLN
0.1250 ug/kg/min | INTRAVENOUS | Status: DC
  Administered 2016-07-14 – 2016-07-15 (×3): 0.3 ug/kg/min via INTRAVENOUS
  Administered 2016-07-16: 0.25 ug/kg/min via INTRAVENOUS
  Administered 2016-07-16: 0.3 ug/kg/min via INTRAVENOUS
  Administered 2016-07-17: 0.125 ug/kg/min via INTRAVENOUS
  Filled 2016-07-14 (×6): qty 100

## 2016-07-14 MED ORDER — EPHEDRINE 5 MG/ML INJ
INTRAVENOUS | Status: AC
  Filled 2016-07-14: qty 10

## 2016-07-14 MED ORDER — SODIUM CHLORIDE 0.9 % IV SOLN
INTRAVENOUS | Status: DC | PRN
  Administered 2016-07-14: 1.3 [IU]/h via INTRAVENOUS

## 2016-07-14 MED ORDER — LIDOCAINE 2% (20 MG/ML) 5 ML SYRINGE
INTRAMUSCULAR | Status: AC
  Filled 2016-07-14: qty 5

## 2016-07-14 MED ORDER — SODIUM CHLORIDE 0.45 % IV SOLN
INTRAVENOUS | Status: DC | PRN
  Administered 2016-07-14: 15:00:00 via INTRAVENOUS

## 2016-07-14 MED ORDER — SODIUM CHLORIDE 0.9 % IV SOLN
250.0000 mL | INTRAVENOUS | Status: DC
  Administered 2016-07-15: 250 mL via INTRAVENOUS

## 2016-07-14 MED ORDER — ACETAMINOPHEN 650 MG RE SUPP
650.0000 mg | Freq: Once | RECTAL | Status: AC
  Administered 2016-07-14: 650 mg via RECTAL

## 2016-07-14 MED ORDER — PANTOPRAZOLE SODIUM 40 MG PO TBEC
40.0000 mg | DELAYED_RELEASE_TABLET | Freq: Every day | ORAL | Status: DC
  Administered 2016-07-16 – 2016-07-23 (×8): 40 mg via ORAL
  Filled 2016-07-14 (×9): qty 1

## 2016-07-14 MED ORDER — AMIODARONE LOAD VIA INFUSION
150.0000 mg | Freq: Once | INTRAVENOUS | Status: AC
  Administered 2016-07-14: 150 mg via INTRAVENOUS
  Filled 2016-07-14: qty 83.34

## 2016-07-14 MED ORDER — MIDAZOLAM HCL 10 MG/2ML IJ SOLN
INTRAMUSCULAR | Status: AC
  Filled 2016-07-14: qty 2

## 2016-07-14 MED ORDER — CHLORHEXIDINE GLUCONATE 0.12% ORAL RINSE (MEDLINE KIT)
15.0000 mL | Freq: Two times a day (BID) | OROMUCOSAL | Status: DC
  Administered 2016-07-14 – 2016-07-15 (×3): 15 mL via OROMUCOSAL

## 2016-07-14 MED ORDER — FENTANYL CITRATE (PF) 250 MCG/5ML IJ SOLN
INTRAMUSCULAR | Status: DC | PRN
  Administered 2016-07-14: 100 ug via INTRAVENOUS
  Administered 2016-07-14: 50 ug via INTRAVENOUS
  Administered 2016-07-14: 950 ug via INTRAVENOUS

## 2016-07-14 MED FILL — Heparin Sodium (Porcine) Inj 1000 Unit/ML: INTRAMUSCULAR | Qty: 30 | Status: AC

## 2016-07-14 MED FILL — Potassium Chloride Inj 2 mEq/ML: INTRAVENOUS | Qty: 40 | Status: AC

## 2016-07-14 MED FILL — Magnesium Sulfate Inj 50%: INTRAMUSCULAR | Qty: 10 | Status: AC

## 2016-07-14 SURGICAL SUPPLY — 107 items
ADAPTER CARDIO PERF ANTE/RETRO (ADAPTER) ×4 IMPLANT
BAG DECANTER FOR FLEXI CONT (MISCELLANEOUS) ×4 IMPLANT
BANDAGE ACE 4X5 VEL STRL LF (GAUZE/BANDAGES/DRESSINGS) ×4 IMPLANT
BANDAGE ACE 6X5 VEL STRL LF (GAUZE/BANDAGES/DRESSINGS) ×4 IMPLANT
BANDAGE ELASTIC 4 VELCRO ST LF (GAUZE/BANDAGES/DRESSINGS) ×4 IMPLANT
BANDAGE ELASTIC 6 VELCRO ST LF (GAUZE/BANDAGES/DRESSINGS) ×4 IMPLANT
BASKET HEART  (ORDER IN 25'S) (MISCELLANEOUS) ×1
BASKET HEART (ORDER IN 25'S) (MISCELLANEOUS) ×1
BASKET HEART (ORDER IN 25S) (MISCELLANEOUS) ×2 IMPLANT
BLADE STERNUM SYSTEM 6 (BLADE) ×4 IMPLANT
BLADE SURG 12 STRL SS (BLADE) ×4 IMPLANT
BLADE SURG ROTATE 9660 (MISCELLANEOUS) ×4 IMPLANT
BNDG GAUZE ELAST 4 BULKY (GAUZE/BANDAGES/DRESSINGS) ×4 IMPLANT
CANISTER SUCTION 2500CC (MISCELLANEOUS) ×4 IMPLANT
CANNULA ARTERIAL NVNT 3/8 22FR (MISCELLANEOUS) ×4 IMPLANT
CANNULA GUNDRY RCSP 15FR (MISCELLANEOUS) ×4 IMPLANT
CATH CPB KIT VANTRIGT (MISCELLANEOUS) ×4 IMPLANT
CATH ROBINSON RED A/P 18FR (CATHETERS) ×12 IMPLANT
CATH THORACIC 36FR RT ANG (CATHETERS) ×4 IMPLANT
CLIP FOGARTY SPRING 6M (CLIP) ×4 IMPLANT
CLIP RETRACTION 3.0MM CORONARY (MISCELLANEOUS) ×4 IMPLANT
CLIP TI WIDE RED SMALL 24 (CLIP) ×8 IMPLANT
CRADLE DONUT ADULT HEAD (MISCELLANEOUS) ×4 IMPLANT
DERMABOND ADVANCED (GAUZE/BANDAGES/DRESSINGS) ×2
DERMABOND ADVANCED .7 DNX12 (GAUZE/BANDAGES/DRESSINGS) ×2 IMPLANT
DRAIN CHANNEL 32F RND 10.7 FF (WOUND CARE) ×4 IMPLANT
DRAPE CARDIOVASCULAR INCISE (DRAPES) ×2
DRAPE SLUSH/WARMER DISC (DRAPES) ×4 IMPLANT
DRAPE SRG 135X102X78XABS (DRAPES) ×2 IMPLANT
DRSG AQUACEL AG ADV 3.5X14 (GAUZE/BANDAGES/DRESSINGS) ×4 IMPLANT
ELECT BLADE 4.0 EZ CLEAN MEGAD (MISCELLANEOUS) ×4
ELECT BLADE 6.5 EXT (BLADE) ×4 IMPLANT
ELECT CAUTERY BLADE 6.4 (BLADE) ×4 IMPLANT
ELECT REM PT RETURN 9FT ADLT (ELECTROSURGICAL) ×8
ELECTRODE BLDE 4.0 EZ CLN MEGD (MISCELLANEOUS) ×2 IMPLANT
ELECTRODE REM PT RTRN 9FT ADLT (ELECTROSURGICAL) ×4 IMPLANT
FELT TEFLON 1X6 (MISCELLANEOUS) ×12 IMPLANT
GAUZE SPONGE 4X4 12PLY STRL (GAUZE/BANDAGES/DRESSINGS) ×8 IMPLANT
GLOVE BIO SURGEON STRL SZ 6.5 (GLOVE) ×6 IMPLANT
GLOVE BIO SURGEON STRL SZ7 (GLOVE) ×4 IMPLANT
GLOVE BIO SURGEON STRL SZ7.5 (GLOVE) ×12 IMPLANT
GLOVE BIO SURGEONS STRL SZ 6.5 (GLOVE) ×2
GLOVE BIOGEL PI IND STRL 6 (GLOVE) ×2 IMPLANT
GLOVE BIOGEL PI IND STRL 6.5 (GLOVE) ×4 IMPLANT
GLOVE BIOGEL PI IND STRL 7.0 (GLOVE) ×6 IMPLANT
GLOVE BIOGEL PI INDICATOR 6 (GLOVE) ×2
GLOVE BIOGEL PI INDICATOR 6.5 (GLOVE) ×4
GLOVE BIOGEL PI INDICATOR 7.0 (GLOVE) ×6
GLOVE SURG SS PI 6.0 STRL IVOR (GLOVE) ×4 IMPLANT
GOWN STRL REUS W/ TWL LRG LVL3 (GOWN DISPOSABLE) ×16 IMPLANT
GOWN STRL REUS W/TWL LRG LVL3 (GOWN DISPOSABLE) ×16
HEMOSTAT POWDER SURGIFOAM 1G (HEMOSTASIS) ×12 IMPLANT
HEMOSTAT SURGICEL 2X14 (HEMOSTASIS) ×4 IMPLANT
INSERT FOGARTY XLG (MISCELLANEOUS) ×4 IMPLANT
KIT BASIN OR (CUSTOM PROCEDURE TRAY) ×4 IMPLANT
KIT ROOM TURNOVER OR (KITS) ×4 IMPLANT
KIT SUCTION CATH 14FR (SUCTIONS) ×8 IMPLANT
KIT VASOVIEW HEMOPRO VH 3000 (KITS) ×4 IMPLANT
LEAD PACING MYOCARDI (MISCELLANEOUS) ×4 IMPLANT
MARKER GRAFT CORONARY BYPASS (MISCELLANEOUS) ×12 IMPLANT
NS IRRIG 1000ML POUR BTL (IV SOLUTION) ×24 IMPLANT
PACK OPEN HEART (CUSTOM PROCEDURE TRAY) ×4 IMPLANT
PAD ARMBOARD 7.5X6 YLW CONV (MISCELLANEOUS) ×8 IMPLANT
PAD ELECT DEFIB RADIOL ZOLL (MISCELLANEOUS) ×4 IMPLANT
PENCIL BUTTON HOLSTER BLD 10FT (ELECTRODE) ×4 IMPLANT
PUNCH AORTIC ROTATE  4.5MM 8IN (MISCELLANEOUS) ×4 IMPLANT
PUNCH AORTIC ROTATE 4.0MM (MISCELLANEOUS) IMPLANT
PUNCH AORTIC ROTATE 4.5MM 8IN (MISCELLANEOUS) IMPLANT
PUNCH AORTIC ROTATE 5MM 8IN (MISCELLANEOUS) IMPLANT
SET CARDIOPLEGIA MPS 5001102 (MISCELLANEOUS) ×4 IMPLANT
SOLUTION ANTI FOG 6CC (MISCELLANEOUS) ×4 IMPLANT
SPONGE LAP 18X18 X RAY DECT (DISPOSABLE) ×4 IMPLANT
SPONGE LAP 4X18 X RAY DECT (DISPOSABLE) ×4 IMPLANT
SURGIFLO W/THROMBIN 8M KIT (HEMOSTASIS) ×4 IMPLANT
SUT BONE WAX W31G (SUTURE) ×4 IMPLANT
SUT MNCRL AB 4-0 PS2 18 (SUTURE) ×4 IMPLANT
SUT PROLENE 3 0 SH DA (SUTURE) IMPLANT
SUT PROLENE 3 0 SH1 36 (SUTURE) IMPLANT
SUT PROLENE 4 0 RB 1 (SUTURE) ×2
SUT PROLENE 4 0 SH DA (SUTURE) ×8 IMPLANT
SUT PROLENE 4-0 RB1 .5 CRCL 36 (SUTURE) ×2 IMPLANT
SUT PROLENE 5 0 C 1 36 (SUTURE) IMPLANT
SUT PROLENE 6 0 C 1 30 (SUTURE) ×16 IMPLANT
SUT PROLENE 6 0 CC (SUTURE) ×16 IMPLANT
SUT PROLENE 8 0 BV175 6 (SUTURE) ×16 IMPLANT
SUT PROLENE BLUE 7 0 (SUTURE) ×12 IMPLANT
SUT SILK  1 MH (SUTURE)
SUT SILK 1 MH (SUTURE) IMPLANT
SUT SILK 2 0 SH CR/8 (SUTURE) ×4 IMPLANT
SUT SILK 3 0 SH CR/8 (SUTURE) IMPLANT
SUT STEEL 6MS V (SUTURE) ×8 IMPLANT
SUT STEEL SZ 6 DBL 3X14 BALL (SUTURE) ×4 IMPLANT
SUT VIC AB 1 CTX 36 (SUTURE) ×4
SUT VIC AB 1 CTX36XBRD ANBCTR (SUTURE) ×4 IMPLANT
SUT VIC AB 2-0 CT1 27 (SUTURE)
SUT VIC AB 2-0 CT1 36 (SUTURE) ×4 IMPLANT
SUT VIC AB 2-0 CT1 TAPERPNT 27 (SUTURE) IMPLANT
SUT VIC AB 2-0 CTX 27 (SUTURE) IMPLANT
SUT VIC AB 3-0 X1 27 (SUTURE) ×4 IMPLANT
SUTURE E-PAK OPEN HEART (SUTURE) ×4 IMPLANT
SYSTEM SAHARA CHEST DRAIN ATS (WOUND CARE) ×4 IMPLANT
TOWEL OR 17X24 6PK STRL BLUE (TOWEL DISPOSABLE) ×8 IMPLANT
TOWEL OR 17X26 10 PK STRL BLUE (TOWEL DISPOSABLE) ×8 IMPLANT
TRAY FOLEY IC TEMP SENS 16FR (CATHETERS) ×4 IMPLANT
TUBING INSUFFLATION (TUBING) ×4 IMPLANT
UNDERPAD 30X30 (UNDERPADS AND DIAPERS) ×4 IMPLANT
WATER STERILE IRR 1000ML POUR (IV SOLUTION) ×8 IMPLANT

## 2016-07-14 NOTE — Anesthesia Procedure Notes (Signed)
Procedure Name: Intubation Date/Time: 07/14/2016 7:47 AM Performed by: Lance Coon Pre-anesthesia Checklist: Patient identified, Emergency Drugs available, Suction available, Patient being monitored and Timeout performed Patient Re-evaluated:Patient Re-evaluated prior to inductionOxygen Delivery Method: Circle system utilized Preoxygenation: Pre-oxygenation with 100% oxygen Intubation Type: IV induction Ventilation: Mask ventilation without difficulty and Oral airway inserted - appropriate to patient size Laryngoscope Size: Glidescope and 4 Grade View: Grade I Tube type: Oral Number of attempts: 2 Airway Equipment and Method: Stylet and Video-laryngoscopy Placement Confirmation: ETT inserted through vocal cords under direct vision,  positive ETCO2 and breath sounds checked- equal and bilateral Secured at: 22 cm Tube secured with: Tape Dental Injury: Teeth and Oropharynx as per pre-operative assessment  Comments: Grade 4 view with miller 2, airway per B. Coomer SRNA

## 2016-07-14 NOTE — Progress Notes (Signed)
RT NOTE:  CPAP available @ bedside. Pt stated he will put on when he is ready to sleep.

## 2016-07-14 NOTE — Anesthesia Preprocedure Evaluation (Signed)
Anesthesia Evaluation  Patient identified by MRN, date of birth, ID band Patient awake    Reviewed: Allergy & Precautions, NPO status , Patient's Chart, lab work & pertinent test results  Airway Mallampati: I  TM Distance: >3 FB Neck ROM: Full    Dental   Pulmonary asthma , former smoker,    Pulmonary exam normal        Cardiovascular hypertension, + CAD and + Past MI  Normal cardiovascular exam     Neuro/Psych    GI/Hepatic   Endo/Other  diabetes  Renal/GU      Musculoskeletal   Abdominal   Peds  Hematology   Anesthesia Other Findings   Reproductive/Obstetrics                             Anesthesia Physical Anesthesia Plan  ASA: III  Anesthesia Plan: General   Post-op Pain Management:    Induction: Intravenous  Airway Management Planned: Oral ETT  Additional Equipment: Arterial line, CVP, PA Cath, Ultrasound Guidance Line Placement and TEE  Intra-op Plan:   Post-operative Plan: Post-operative intubation/ventilation  Informed Consent: I have reviewed the patients History and Physical, chart, labs and discussed the procedure including the risks, benefits and alternatives for the proposed anesthesia with the patient or authorized representative who has indicated his/her understanding and acceptance.     Plan Discussed with: CRNA and Surgeon  Anesthesia Plan Comments:         Anesthesia Quick Evaluation

## 2016-07-14 NOTE — Transfer of Care (Signed)
Immediate Anesthesia Transfer of Care Note  Patient: Tommy Sullivan  Procedure(s) Performed: Procedure(s) with comments: CORONARY ARTERY BYPASS GRAFTING (CABG), ON PUMP, TIMES FIVE, USING LEFT INTERNAL MAMMARY ARTERY, RIGHT GREATER SAPHENOUS VEIN HARVESTED ENDOSCOPICALLY (N/A) - SVG to PD and PL sequential SVG to 1st diagonal SVG to Ramus LIMA to LAD TRANSESOPHAGEAL ECHOCARDIOGRAM (TEE) (N/A)  Patient Location: SICU  Anesthesia Type:General  Level of Consciousness: unresponsive and Patient remains intubated per anesthesia plan  Airway & Oxygen Therapy: Patient remains intubated per anesthesia plan and Patient placed on Ventilator (see vital sign flow sheet for setting)  Post-op Assessment: Report given to RN and Post -op Vital signs reviewed and stable  Post vital signs: Reviewed and stable  Last Vitals:  Vitals:   07/14/16 0509 07/14/16 1515  BP: 125/79 111/65  Pulse: 63 89  Resp:  12  Temp:      Last Pain:  Vitals:   07/13/16 2040  TempSrc:   PainSc: 0-No pain      Patients Stated Pain Goal: 0 ([id number])  Complications: No apparent anesthesia complications

## 2016-07-14 NOTE — OR Nursing (Signed)
1st call to ICU

## 2016-07-14 NOTE — Brief Op Note (Signed)
07/10/2016 - 07/14/2016  12:55 PM  PATIENT:  Tommy Sullivan  70 y.o. male  PRE-OPERATIVE DIAGNOSIS:  CAD  POST-OPERATIVE DIAGNOSIS:  CAD  PROCEDURE:  Procedure(s): CORONARY ARTERY BYPASS GRAFTING (CABG), ON PUMP, TIMES FIVE, USING LEFT INTERNAL MAMMARY ARTERY, RIGHT GREATER SAPHENOUS VEIN HARVESTED ENDOSCOPICALLY (N/A) TRANSESOPHAGEAL ECHOCARDIOGRAM (TEE) (N/A)  SVG to PD and PL sequential SVG to 1st diagonal SVG to Ramus LIMA to LAD   SURGEON:  Surgeon(s) and Role:    * Ivin Poot, MD - Primary  PHYSICIAN ASSISTANT:  Nicholes Rough, PA-C  ANESTHESIA:   general  EBL:  Total I/O In: 1000 [I.V.:1000] Out: 290 [Urine:290]  BLOOD ADMINISTERED:none  DRAINS: ROUTINE  LOCAL MEDICATIONS USED:  NONE  SPECIMEN:  No Specimen  DISPOSITION OF SPECIMEN:  N/A  COUNTS:  YES  TOURNIQUET:  * No tourniquets in log *  DICTATION: .Other Dictation: Dictation Number PENDING  PLAN OF CARE: Admit to inpatient   PATIENT DISPOSITION:  ICU - intubated and hemodynamically stable.   Delay start of Pharmacological VTE agent (>24hrs) due to surgical blood loss or risk of bleeding: yes

## 2016-07-14 NOTE — OR Nursing (Signed)
Twenty minute call to SICU charge nurse at 1447. Spoke to [employer].

## 2016-07-14 NOTE — Procedures (Signed)
Extubation Procedure Note  Patient Details:   Name: Tommy Sullivan DOB: 1945-11-07 MRN: [id number]   Airway Documentation:     Evaluation  O2 sats: stable throughout Complications: No apparent complications Patient did tolerate procedure well. Bilateral Breath Sounds: Clear   Yes   Pt. Extubated per rapid wean protocol. Pt. Performed 1.2L on the vital capacity and -45 on the NIF. Pt. Also performed 1000x5 on the incentive spirometer. Pt. Had a positive cuff leak and was able to state his name after extubation. Pt. Tolerating 6L nasal cannula at this time.  Marlowe Aschoff 07/14/2016, 10:48 PM

## 2016-07-14 NOTE — Progress Notes (Signed)
Patient has been transferred to preop for CABG procedure. Pt is alone at this time. Glasses and incentive spirometer has been bagged and labeled to be sent to [place] where pt will recover after surgery.

## 2016-07-14 NOTE — Progress Notes (Signed)
patient examined and medical record reviewed,agree with above note. Tommy Sullivan 07/14/2016  Plan CABG on [place]

## 2016-07-14 NOTE — Progress Notes (Signed)
Pt had a 75-90 beat run of an irregular rhythm. Notified Dr. Prescott Gum and was instructed to give 100 of Lidocaine, a bolus and continuous infusion of amiodarone, and get an ABG. Will continue to monitor closely.

## 2016-07-14 NOTE — Anesthesia Postprocedure Evaluation (Signed)
Anesthesia Post Note  Patient: Tommy Sullivan  Procedure(s) Performed: Procedure(s) (LRB): CORONARY ARTERY BYPASS GRAFTING (CABG), ON PUMP, TIMES FIVE, USING LEFT INTERNAL MAMMARY ARTERY, RIGHT GREATER SAPHENOUS VEIN HARVESTED ENDOSCOPICALLY (N/A) TRANSESOPHAGEAL ECHOCARDIOGRAM (TEE) (N/A)  Patient location during evaluation: SICU Anesthesia Type: General Level of consciousness: sedated Pain management: pain level controlled Vital Signs Assessment: post-procedure vital signs reviewed and stable Respiratory status: patient remains intubated per anesthesia plan Cardiovascular status: stable Anesthetic complications: no    Last Vitals:  Vitals:   07/14/16 1630 07/14/16 1645  BP:    Pulse: 84 84  Resp: 18 17  Temp: 36.1 C 36.2 C    Last Pain:  Vitals:   07/13/16 2040  TempSrc:   PainSc: 0-No pain                 Jentzen Minasyan DAVID

## 2016-07-14 NOTE — Plan of Care (Signed)
Problem: Education: Goal: Knowledge of [facility] Education information/materials will improve Outcome: Progressing Pt unable to watch educational videos regarding CABG d/t system being down. Provided pt with educational print outs regarding procedure and after surgery care.

## 2016-07-14 NOTE — Anesthesia Procedure Notes (Signed)
Procedures

## 2016-07-14 NOTE — Progress Notes (Signed)
Wean protocol initiated. 

## 2016-07-14 NOTE — Progress Notes (Signed)
  Echocardiogram Echocardiogram Transesophageal has been performed.  Darlina Sicilian M 07/14/2016, 8:34 AM

## 2016-07-15 ENCOUNTER — Inpatient Hospital Stay (HOSPITAL_COMMUNITY): Payer: 59

## 2016-07-15 LAB — BASIC METABOLIC PANEL
Anion gap: 9 (ref 5–15)
BUN: 15 mg/dL (ref 6–20)
CHLORIDE: 105 mmol/L (ref 101–111)
CO2: 23 mmol/L (ref 22–32)
Calcium: 8.3 mg/dL — ABNORMAL LOW (ref 8.9–10.3)
Creatinine, Ser: 1.25 mg/dL — ABNORMAL HIGH (ref 0.61–1.24)
GFR calc non Af Amer: 57 mL/min — ABNORMAL LOW (ref >60)
Glucose, Bld: 133 mg/dL — ABNORMAL HIGH (ref 65–99)
POTASSIUM: 4.1 mmol/L (ref 3.5–5.1)
SODIUM: 137 mmol/L (ref 135–145)

## 2016-07-15 LAB — GLUCOSE, CAPILLARY
GLUCOSE-CAPILLARY: 111 mg/dL — AB (ref 65–99)
GLUCOSE-CAPILLARY: 114 mg/dL — AB (ref 65–99)
GLUCOSE-CAPILLARY: 126 mg/dL — AB (ref 65–99)
GLUCOSE-CAPILLARY: 130 mg/dL — AB (ref 65–99)
GLUCOSE-CAPILLARY: 157 mg/dL — AB (ref 65–99)
GLUCOSE-CAPILLARY: 162 mg/dL — AB (ref 65–99)
GLUCOSE-CAPILLARY: 168 mg/dL — AB (ref 65–99)
GLUCOSE-CAPILLARY: 177 mg/dL — AB (ref 65–99)
GLUCOSE-CAPILLARY: 178 mg/dL — AB (ref 65–99)
Glucose-Capillary: 154 mg/dL — ABNORMAL HIGH (ref 65–99)
Glucose-Capillary: 190 mg/dL — ABNORMAL HIGH (ref 65–99)
Glucose-Capillary: 137 mg/dL — ABNORMAL HIGH (ref 65–99)
Glucose-Capillary: 116 mg/dL — ABNORMAL HIGH (ref 65–99)
Glucose-Capillary: 113 mg/dL — ABNORMAL HIGH (ref 65–99)
Glucose-Capillary: 104 mg/dL — ABNORMAL HIGH (ref 65–99)
Glucose-Capillary: 112 mg/dL — ABNORMAL HIGH (ref 65–99)
Glucose-Capillary: 139 mg/dL — ABNORMAL HIGH (ref 65–99)
Glucose-Capillary: 285 mg/dL — ABNORMAL HIGH (ref 65–99)

## 2016-07-15 LAB — POCT I-STAT 3, ART BLOOD GAS (G3+)
ACID-BASE DEFICIT: 2 mmol/L (ref 0.0–2.0)
Acid-base deficit: 3 mmol/L — ABNORMAL HIGH (ref 0.0–2.0)
BICARBONATE: 21.1 mmol/L (ref 20.0–28.0)
BICARBONATE: 22.3 mmol/L (ref 20.0–28.0)
O2 SAT: 94 %
O2 Saturation: 94 %
PCO2 ART: 35.3 mmHg (ref 32.0–48.0)
PH ART: 7.386 (ref 7.350–7.450)
PO2 ART: 73 mmHg — AB (ref 83.0–108.0)
PO2 ART: 73 mmHg — AB (ref 83.0–108.0)
Patient temperature: 37
TCO2: 22 mmol/L (ref 0–100)
TCO2: 23 mmol/L (ref 0–100)
pCO2 arterial: 36.8 mmHg (ref 32.0–48.0)
pH, Arterial: 7.391 (ref 7.350–7.450)

## 2016-07-15 LAB — CBC
HEMATOCRIT: 32.6 % — AB (ref 39.0–52.0)
HEMATOCRIT: 31.9 % — AB (ref 39.0–52.0)
HEMOGLOBIN: 10.8 g/dL — AB (ref 13.0–17.0)
Hemoglobin: 10.4 g/dL — ABNORMAL LOW (ref 13.0–17.0)
MCH: 29.5 pg (ref 26.0–34.0)
MCH: 29.2 pg (ref 26.0–34.0)
MCHC: 33.1 g/dL (ref 30.0–36.0)
MCHC: 32.6 g/dL (ref 30.0–36.0)
MCV: 89.1 fL (ref 78.0–100.0)
MCV: 89.6 fL (ref 78.0–100.0)
PLATELETS: 80 10*3/uL — AB (ref 150–400)
Platelets: 112 10*3/uL — ABNORMAL LOW (ref 150–400)
RBC: 3.66 MIL/uL — AB (ref 4.22–5.81)
RBC: 3.56 MIL/uL — AB (ref 4.22–5.81)
RDW: 14 % (ref 11.5–15.5)
RDW: 14.3 % (ref 11.5–15.5)
WBC: 16.6 10*3/uL — ABNORMAL HIGH (ref 4.0–10.5)
WBC: 14.6 10*3/uL — ABNORMAL HIGH (ref 4.0–10.5)

## 2016-07-15 LAB — PREPARE FRESH FROZEN PLASMA
UNIT DIVISION: 0
UNIT DIVISION: 0

## 2016-07-15 LAB — POCT I-STAT, CHEM 8
BUN: 21 mg/dL — AB (ref 6–20)
CALCIUM ION: 1.07 mmol/L — AB (ref 1.15–1.40)
CHLORIDE: 99 mmol/L — AB (ref 101–111)
CREATININE: 1.2 mg/dL (ref 0.61–1.24)
Glucose, Bld: 149 mg/dL — ABNORMAL HIGH (ref 65–99)
HCT: 33 % — ABNORMAL LOW (ref 39.0–52.0)
Hemoglobin: 11.2 g/dL — ABNORMAL LOW (ref 13.0–17.0)
Potassium: 5.3 mmol/L — ABNORMAL HIGH (ref 3.5–5.1)
Sodium: 132 mmol/L — ABNORMAL LOW (ref 135–145)
TCO2: 25 mmol/L (ref 0–100)

## 2016-07-15 LAB — CREATININE, SERUM
Creatinine, Ser: 1.27 mg/dL — ABNORMAL HIGH (ref 0.61–1.24)
GFR calc Af Amer: >60 mL/min (ref >60)
GFR calc non Af Amer: 56 mL/min — ABNORMAL LOW (ref >60)

## 2016-07-15 LAB — MAGNESIUM
MAGNESIUM: 2.4 mg/dL (ref 1.7–2.4)
Magnesium: 2.3 mg/dL (ref 1.7–2.4)

## 2016-07-15 MED ORDER — FUROSEMIDE 10 MG/ML IJ SOLN
40.0000 mg | Freq: Once | INTRAMUSCULAR | Status: AC
  Administered 2016-07-15: 40 mg via INTRAVENOUS
  Filled 2016-07-15: qty 4

## 2016-07-15 MED ORDER — FENTANYL 100 MCG/HR TD PT72
100.0000 ug | MEDICATED_PATCH | TRANSDERMAL | Status: DC
  Administered 2016-07-15: 100 ug via TRANSDERMAL
  Filled 2016-07-15: qty 1

## 2016-07-15 MED ORDER — ALBUMIN HUMAN 5 % IV SOLN
12.5000 g | INTRAVENOUS | Status: DC | PRN
  Administered 2016-07-15: 12.5 g via INTRAVENOUS
  Filled 2016-07-15: qty 250

## 2016-07-15 MED ORDER — INSULIN DETEMIR 100 UNIT/ML ~~LOC~~ SOLN
12.0000 [IU] | Freq: Once | SUBCUTANEOUS | Status: AC
  Administered 2016-07-15: 12 [IU] via SUBCUTANEOUS
  Filled 2016-07-15: qty 0.12

## 2016-07-15 MED ORDER — PROMETHAZINE HCL 25 MG/ML IJ SOLN: 12.5000 mg | Freq: Four times a day (QID) | INTRAMUSCULAR | Status: DC | PRN

## 2016-07-15 MED ORDER — INSULIN ASPART 100 UNIT/ML ~~LOC~~ SOLN
0.0000 [IU] | SUBCUTANEOUS | Status: DC
  Administered 2016-07-15: 2 [IU] via SUBCUTANEOUS
  Administered 2016-07-15: 12 [IU] via SUBCUTANEOUS
  Administered 2016-07-16: 4 [IU] via SUBCUTANEOUS
  Administered 2016-07-16 (×2): 8 [IU] via SUBCUTANEOUS
  Administered 2016-07-16: 4 [IU] via SUBCUTANEOUS
  Administered 2016-07-16: 8 [IU] via SUBCUTANEOUS
  Administered 2016-07-16: 4 [IU] via SUBCUTANEOUS
  Administered 2016-07-16: 8 [IU] via SUBCUTANEOUS
  Administered 2016-07-17: 2 [IU] via SUBCUTANEOUS
  Administered 2016-07-17: 4 [IU] via SUBCUTANEOUS
  Administered 2016-07-17 (×2): 2 [IU] via SUBCUTANEOUS
  Administered 2016-07-17: 8 [IU] via SUBCUTANEOUS
  Administered 2016-07-18 – 2016-07-19 (×7): 2 [IU] via SUBCUTANEOUS
  Administered 2016-07-20: 4 [IU] via SUBCUTANEOUS

## 2016-07-15 MED ORDER — ORAL CARE MOUTH RINSE
15.0000 mL | Freq: Two times a day (BID) | OROMUCOSAL | Status: DC
  Administered 2016-07-15 – 2016-07-21 (×7): 15 mL via OROMUCOSAL

## 2016-07-15 MED ORDER — NOREPINEPHRINE BITARTRATE 1 MG/ML IV SOLN
0.0000 ug/min | INTRAVENOUS | Status: DC
  Administered 2016-07-15: 8 ug/min via INTRAVENOUS
  Filled 2016-07-15: qty 16

## 2016-07-15 NOTE — Progress Notes (Signed)
1 Day Post-Op Procedure(s) (LRB): CORONARY ARTERY BYPASS GRAFTING (CABG), ON PUMP, TIMES FIVE, USING LEFT INTERNAL MAMMARY ARTERY, RIGHT GREATER SAPHENOUS VEIN HARVESTED ENDOSCOPICALLY (N/A) TRANSESOPHAGEAL ECHOCARDIOGRAM (TEE) (N/A) Subjective: Nauseated junctional tachycardia  Objective: Vital signs in last 24 hours: Temp:  [96.8 F (36 C)-99.5 F (37.5 C)] 99.1 F (37.3 C) (10/14 1000) Pulse Rate:  [80-99] 94 (10/14 1000) Cardiac Rhythm: A-V Sequential paced (10/14 0700) Resp:  [11-28] 17 (10/14 1000) BP: (67-122)/(46-77) 122/66 (10/14 0900) SpO2:  [92 %-100 %] 94 % (10/14 1000) Arterial Line BP: (87-279)/(45-275) 119/63 (10/14 1000) FiO2 (%):  [40 %-70 %] 40 % (10/13 2209) Weight:  [245 lb 6 oz (111.3 kg)] 245 lb 6 oz (111.3 kg) (10/14 0339)  Hemodynamic parameters for last 24 hours: PAP: (24-46)/(14-33) 30/19 CO:  [4.3 L/min-6.5 L/min] 4.5 L/min CI:  [2 L/min/m2-2.9 L/min/m2] 2 L/min/m2  Intake/Output from previous day: 10/13 0701 - 10/14 0700 In: 8785.8 [I.V.:5781.8; Blood:1104; IV Piggyback:1900] Out: [id number]; Blood:1400; Chest Tube:700] Intake/Output this shift: Total I/O In: 276.5 [I.V.:226.5; IV Piggyback:50] Out: 220 [Urine:90; Chest Tube:130]  abd soft  Lab Results:  Recent Labs  07/14/16 2118 07/15/16 0418  WBC 13.2* 16.6*  HGB 10.5* 10.8*  HCT 31.6* 32.6*  PLT 101* 112*   BMET:  Recent Labs  07/14/16 0217  07/14/16 2110 07/14/16 2118 07/15/16 0418  NA 139  < > 139  --  137  K 4.4  < > 4.0  --  4.1  CL 102  < > 102  --  105  CO2 30  --   --   --  23  GLUCOSE 118*  < > 168*  --  133*  BUN 16  < > 14  --  15  CREATININE 1.13  < > 0.90 1.10 1.25*  CALCIUM 10.3  --   --   --  8.3*  < > = values in this interval not displayed.  PT/INR:  Recent Labs  07/14/16 1519  LABPROT 17.6*  INR 1.44   ABG    Component Value Date/Time   PHART 7.391 07/14/2016 2357   HCO3 22.3 07/14/2016 2357   TCO2 23 07/14/2016 2357   ACIDBASEDEF 2.0  07/14/2016 2357   O2SAT 94.0 07/14/2016 2357   CBG (last 3)   Recent Labs  07/15/16 0653 07/15/16 0835 07/15/16 0922  GLUCAP 114* 111* 104*    Assessment/Plan: S/P Procedure(s) (LRB): CORONARY ARTERY BYPASS GRAFTING (CABG), ON PUMP, TIMES FIVE, USING LEFT INTERNAL MAMMARY ARTERY, RIGHT GREATER SAPHENOUS VEIN HARVESTED ENDOSCOPICALLY (N/A) TRANSESOPHAGEAL ECHOCARDIOGRAM (TEE) (N/A) Mobilize Diabetes control See progression orders   LOS: 5 days    Tharon Aquas Trigt III 07/15/2016

## 2016-07-15 NOTE — Progress Notes (Addendum)
Patient did have vagal episode x 2, became diaphoretic, nauseated, and shaky.  Patient was not dangled, will attempt in am after patient rest and recuperates. Patient on 30mcg of NEO, 1mcg of DOPA, and 0.3 MIL and 30mg  of AMIO.  Patient AV Paced at 88  Will monitor.

## 2016-07-16 ENCOUNTER — Inpatient Hospital Stay (HOSPITAL_COMMUNITY): Payer: 59

## 2016-07-16 ENCOUNTER — Encounter (HOSPITAL_COMMUNITY): Payer: Self-pay | Admitting: Cardiothoracic Surgery

## 2016-07-16 DIAGNOSIS — I480 Paroxysmal atrial fibrillation: Secondary | ICD-10-CM | POA: Diagnosis not present

## 2016-07-16 DIAGNOSIS — I25110 Atherosclerotic heart disease of native coronary artery with unstable angina pectoris: Secondary | ICD-10-CM | POA: Diagnosis not present

## 2016-07-16 DIAGNOSIS — E871 Hypo-osmolality and hyponatremia: Secondary | ICD-10-CM | POA: Diagnosis not present

## 2016-07-16 DIAGNOSIS — J9811 Atelectasis: Secondary | ICD-10-CM | POA: Diagnosis not present

## 2016-07-16 DIAGNOSIS — I110 Hypertensive heart disease with heart failure: Secondary | ICD-10-CM | POA: Diagnosis not present

## 2016-07-16 DIAGNOSIS — I5023 Acute on chronic systolic (congestive) heart failure: Secondary | ICD-10-CM | POA: Diagnosis not present

## 2016-07-16 DIAGNOSIS — E1165 Type 2 diabetes mellitus with hyperglycemia: Secondary | ICD-10-CM | POA: Diagnosis not present

## 2016-07-16 DIAGNOSIS — I471 Supraventricular tachycardia: Secondary | ICD-10-CM | POA: Diagnosis not present

## 2016-07-16 DIAGNOSIS — D62 Acute posthemorrhagic anemia: Secondary | ICD-10-CM | POA: Diagnosis not present

## 2016-07-16 LAB — TYPE AND SCREEN
ABO/RH(D): A POS
Antibody Screen: NEGATIVE
Unit division: 0
Unit division: 0

## 2016-07-16 LAB — BASIC METABOLIC PANEL
Anion gap: 7 (ref 5–15)
BUN: 23 mg/dL — ABNORMAL HIGH (ref 6–20)
CO2: 25 mmol/L (ref 22–32)
Calcium: 7.9 mg/dL — ABNORMAL LOW (ref 8.9–10.3)
Chloride: 101 mmol/L (ref 101–111)
Creatinine, Ser: 1.22 mg/dL (ref 0.61–1.24)
GFR calc Af Amer: >60 mL/min (ref >60)
GFR calc non Af Amer: 58 mL/min — ABNORMAL LOW (ref >60)
Glucose, Bld: 179 mg/dL — ABNORMAL HIGH (ref 65–99)
Potassium: 5.2 mmol/L — ABNORMAL HIGH (ref 3.5–5.1)
Sodium: 133 mmol/L — ABNORMAL LOW (ref 135–145)

## 2016-07-16 LAB — CBC
HCT: 31.3 % — ABNORMAL LOW (ref 39.0–52.0)
Hemoglobin: 10.3 g/dL — ABNORMAL LOW (ref 13.0–17.0)
MCH: 29.5 pg (ref 26.0–34.0)
MCHC: 32.9 g/dL (ref 30.0–36.0)
MCV: 89.7 fL (ref 78.0–100.0)
Platelets: 85 10*3/uL — ABNORMAL LOW (ref 150–400)
RBC: 3.49 MIL/uL — ABNORMAL LOW (ref 4.22–5.81)
RDW: 14.5 % (ref 11.5–15.5)
WBC: 14.2 10*3/uL — ABNORMAL HIGH (ref 4.0–10.5)

## 2016-07-16 LAB — COOXEMETRY PANEL
Carboxyhemoglobin: 1 % (ref 0.5–1.5)
Methemoglobin: 1.2 % (ref 0.0–1.5)
O2 Saturation: 51.6 %
Total hemoglobin: 10.7 g/dL — ABNORMAL LOW (ref 12.0–16.0)

## 2016-07-16 LAB — POCT I-STAT 3, ART BLOOD GAS (G3+)
Acid-base deficit: 1 mmol/L (ref 0.0–2.0)
Bicarbonate: 25.4 mmol/L (ref 20.0–28.0)
O2 Saturation: 94 %
PCO2 ART: 46.5 mmHg (ref 32.0–48.0)
PH ART: 7.346 — AB (ref 7.350–7.450)
TCO2: 27 mmol/L (ref 0–100)
pO2, Arterial: 75 mmHg — ABNORMAL LOW (ref 83.0–108.0)

## 2016-07-16 LAB — GLUCOSE, CAPILLARY
GLUCOSE-CAPILLARY: 166 mg/dL — AB (ref 65–99)
GLUCOSE-CAPILLARY: 225 mg/dL — AB (ref 65–99)
GLUCOSE-CAPILLARY: 182 mg/dL — AB (ref 65–99)
Glucose-Capillary: 187 mg/dL — ABNORMAL HIGH (ref 65–99)
Glucose-Capillary: 202 mg/dL — ABNORMAL HIGH (ref 65–99)
Glucose-Capillary: 211 mg/dL — ABNORMAL HIGH (ref 65–99)
Glucose-Capillary: 223 mg/dL — ABNORMAL HIGH (ref 65–99)

## 2016-07-16 MED ORDER — LEVALBUTEROL HCL 1.25 MG/0.5ML IN NEBU
1.2500 mg | INHALATION_SOLUTION | Freq: Four times a day (QID) | RESPIRATORY_TRACT | Status: DC
  Administered 2016-07-16 – 2016-07-17 (×2): 1.25 mg via RESPIRATORY_TRACT
  Filled 2016-07-16 (×2): qty 0.5

## 2016-07-16 MED ORDER — OXYCODONE HCL 5 MG PO TABS
5.0000 mg | ORAL_TABLET | ORAL | Status: DC | PRN
  Administered 2016-07-18 – 2016-07-21 (×6): 5 mg via ORAL
  Filled 2016-07-16 (×6): qty 1

## 2016-07-16 MED ORDER — METOLAZONE 5 MG PO TABS
5.0000 mg | ORAL_TABLET | Freq: Every day | ORAL | Status: DC
  Administered 2016-07-16 – 2016-07-17 (×2): 5 mg via ORAL
  Filled 2016-07-16 (×2): qty 1

## 2016-07-16 MED ORDER — LEVALBUTEROL HCL 1.25 MG/0.5ML IN NEBU
1.2500 mg | INHALATION_SOLUTION | Freq: Four times a day (QID) | RESPIRATORY_TRACT | Status: DC
  Administered 2016-07-16: 1.25 mg via RESPIRATORY_TRACT
  Filled 2016-07-16: qty 0.5

## 2016-07-16 MED ORDER — DOPAMINE-DEXTROSE 3.2-5 MG/ML-% IV SOLN
INTRAVENOUS | Status: AC
  Administered 2016-07-16: 09:00:00
  Filled 2016-07-16: qty 250

## 2016-07-16 MED ORDER — DOPAMINE-DEXTROSE 3.2-5 MG/ML-% IV SOLN
2.0000 ug/kg/min | INTRAVENOUS | Status: DC
  Administered 2016-07-16: 2 ug/kg/min via INTRAVENOUS

## 2016-07-16 MED ORDER — INSULIN DETEMIR 100 UNIT/ML ~~LOC~~ SOLN
20.0000 [IU] | Freq: Two times a day (BID) | SUBCUTANEOUS | Status: DC
  Administered 2016-07-16 – 2016-07-23 (×15): 20 [IU] via SUBCUTANEOUS
  Filled 2016-07-16 (×16): qty 0.2

## 2016-07-16 MED ORDER — FUROSEMIDE 10 MG/ML IJ SOLN
40.0000 mg | Freq: Every day | INTRAMUSCULAR | Status: DC
  Administered 2016-07-16 – 2016-07-20 (×5): 40 mg via INTRAVENOUS
  Filled 2016-07-16 (×6): qty 4

## 2016-07-16 MED ORDER — BUDESONIDE 0.5 MG/2ML IN SUSP
0.5000 mg | Freq: Two times a day (BID) | RESPIRATORY_TRACT | Status: DC
  Administered 2016-07-16 – 2016-07-23 (×13): 0.5 mg via RESPIRATORY_TRACT
  Filled 2016-07-16 (×15): qty 2

## 2016-07-16 NOTE — Progress Notes (Signed)
2 Days Post-Op Procedure(s) (LRB): CORONARY ARTERY BYPASS GRAFTING (CABG), ON PUMP, TIMES FIVE, USING LEFT INTERNAL MAMMARY ARTERY, RIGHT GREATER SAPHENOUS VEIN HARVESTED ENDOSCOPICALLY (N/A) TRANSESOPHAGEAL ECHOCARDIOGRAM (TEE) (N/A) Subjective: Oversedated today was unable to walk Sinus rhythm ABGs are normal Increase Lantus dosing to maintain adequate glucose control Narcotics significantly reduced  Objective: Vital signs in last 24 hours: Temp:  [97.8 F (36.6 C)-98.9 F (37.2 C)] 98.9 F (37.2 C) (10/15 1500) Pulse Rate:  [71-97] 94 (10/15 1900) Cardiac Rhythm: Heart block (10/15 0800) Resp:  [6-22] 6 (10/15 0800) BP: (104-142)/(64-103) 136/90 (10/15 1900) SpO2:  [83 %-96 %] 95 % (10/15 1900) Arterial Line BP: (109-164)/(55-85) 164/85 (10/15 1000) Weight:  [247 lb 12.8 oz (112.4 kg)] 247 lb 12.8 oz (112.4 kg) (10/15 0700)  Hemodynamic parameters for last 24 hours: CVP:  [3 mmHg-42 mmHg] 3 mmHg  Intake/Output from previous day: 10/14 0701 - 10/15 0700 In: 2743.2 [P.O.:310; I.V.:2383.2; IV Piggyback:50] Out: 1935 [Urine:1275; Chest Tube:660] Intake/Output this shift: No intake/output data recorded.  Mild edema Abdomen soft Responds but is sedated ABG normal  Lab Results:  Recent Labs  07/15/16 1707 07/16/16 0440  WBC 14.6* 14.2*  HGB 10.4* 10.3*  HCT 31.9* 31.3*  PLT 80* 85*   BMET:  Recent Labs  07/15/16 0418 07/15/16 1704 07/15/16 1707 07/16/16 0440  NA 137 132*  --  133*  K 4.1 5.3*  --  5.2*  CL 105 99*  --  101  CO2 23  --   --  25  GLUCOSE 133* 149*  --  179*  BUN 15 21*  --  23*  CREATININE 1.25* 1.20 1.27* 1.22  CALCIUM 8.3*  --   --  7.9*    PT/INR:  Recent Labs  07/14/16 1519  LABPROT 17.6*  INR 1.44   ABG    Component Value Date/Time   PHART 7.346 (L) 07/16/2016 1900   HCO3 25.4 07/16/2016 1900   TCO2 27 07/16/2016 1900   ACIDBASEDEF 1.0 07/16/2016 1900   O2SAT 94.0 07/16/2016 1900   CBG (last 3)   Recent Labs  07/16/16 0905 07/16/16 1237 07/16/16 1614  GLUCAP 202* 223* 225*    Assessment/Plan: S/P Procedure(s) (LRB): CORONARY ARTERY BYPASS GRAFTING (CABG), ON PUMP, TIMES FIVE, USING LEFT INTERNAL MAMMARY ARTERY, RIGHT GREATER SAPHENOUS VEIN HARVESTED ENDOSCOPICALLY (N/A) TRANSESOPHAGEAL ECHOCARDIOGRAM (TEE) (N/A) Reduced narcotics Continue diuretics Out of bed and ambulation is the goal for tomorrow   LOS: 6 days    Tommy Sullivan 07/16/2016

## 2016-07-16 NOTE — Progress Notes (Signed)
Duragesic patch d/c'd into sharps box witnessed by Dr. Darcey Nora

## 2016-07-17 ENCOUNTER — Ambulatory Visit: Payer: 59 | Admitting: Cardiovascular Disease

## 2016-07-17 ENCOUNTER — Inpatient Hospital Stay (HOSPITAL_COMMUNITY): Payer: 59

## 2016-07-17 DIAGNOSIS — Z951 Presence of aortocoronary bypass graft: Secondary | ICD-10-CM

## 2016-07-17 LAB — GLUCOSE, CAPILLARY
GLUCOSE-CAPILLARY: 100 mg/dL — AB (ref 65–99)
GLUCOSE-CAPILLARY: 177 mg/dL — AB (ref 65–99)
Glucose-Capillary: 143 mg/dL — ABNORMAL HIGH (ref 65–99)
Glucose-Capillary: 154 mg/dL — ABNORMAL HIGH (ref 65–99)
Glucose-Capillary: 240 mg/dL — ABNORMAL HIGH (ref 65–99)
Glucose-Capillary: 122 mg/dL — ABNORMAL HIGH (ref 65–99)

## 2016-07-17 LAB — CBC
HCT: 32.3 % — ABNORMAL LOW (ref 39.0–52.0)
Hemoglobin: 10.4 g/dL — ABNORMAL LOW (ref 13.0–17.0)
MCH: 29.4 pg (ref 26.0–34.0)
MCHC: 32.2 g/dL (ref 30.0–36.0)
MCV: 91.2 fL (ref 78.0–100.0)
Platelets: 117 10*3/uL — ABNORMAL LOW (ref 150–400)
RBC: 3.54 MIL/uL — ABNORMAL LOW (ref 4.22–5.81)
RDW: 14.2 % (ref 11.5–15.5)
WBC: 13.6 10*3/uL — ABNORMAL HIGH (ref 4.0–10.5)

## 2016-07-17 LAB — BASIC METABOLIC PANEL
Anion gap: 7 (ref 5–15)
BUN: 29 mg/dL — ABNORMAL HIGH (ref 6–20)
CO2: 28 mmol/L (ref 22–32)
Calcium: 8.2 mg/dL — ABNORMAL LOW (ref 8.9–10.3)
Chloride: 97 mmol/L — ABNORMAL LOW (ref 101–111)
Creatinine, Ser: 1.14 mg/dL (ref 0.61–1.24)
GFR calc Af Amer: >60 mL/min (ref >60)
GFR calc non Af Amer: >60 mL/min (ref >60)
Glucose, Bld: 154 mg/dL — ABNORMAL HIGH (ref 65–99)
Potassium: 4.8 mmol/L (ref 3.5–5.1)
Sodium: 132 mmol/L — ABNORMAL LOW (ref 135–145)

## 2016-07-17 MED ORDER — LEVALBUTEROL HCL 1.25 MG/0.5ML IN NEBU
1.2500 mg | INHALATION_SOLUTION | Freq: Four times a day (QID) | RESPIRATORY_TRACT | Status: DC | PRN
  Administered 2016-07-17 – 2016-07-19 (×2): 1.25 mg via RESPIRATORY_TRACT
  Filled 2016-07-17 (×2): qty 0.5

## 2016-07-17 MED ORDER — LATANOPROST 0.005 % OP SOLN
1.0000 [drp] | Freq: Every day | OPHTHALMIC | Status: DC
  Administered 2016-07-17 – 2016-07-22 (×6): 1 [drp] via OPHTHALMIC
  Filled 2016-07-17 (×2): qty 2.5

## 2016-07-17 MED FILL — Heparin Sodium (Porcine) Inj 1000 Unit/ML: INTRAMUSCULAR | Qty: 30 | Status: AC

## 2016-07-17 MED FILL — Lidocaine HCl IV Inj 20 MG/ML: INTRAVENOUS | Qty: 5 | Status: AC

## 2016-07-17 MED FILL — Mannitol IV Soln 20%: INTRAVENOUS | Qty: 500 | Status: AC

## 2016-07-17 MED FILL — Electrolyte-R (PH 7.4) Solution: INTRAVENOUS | Qty: 4000 | Status: AC

## 2016-07-17 MED FILL — Sodium Chloride IV Soln 0.9%: INTRAVENOUS | Qty: 2000 | Status: AC

## 2016-07-17 MED FILL — Sodium Bicarbonate IV Soln 8.4%: INTRAVENOUS | Qty: 50 | Status: AC

## 2016-07-17 NOTE — Progress Notes (Signed)
3 Days Post-Op Procedure(s) (LRB): CORONARY ARTERY BYPASS GRAFTING (CABG), ON PUMP, TIMES FIVE, USING LEFT INTERNAL MAMMARY ARTERY, RIGHT GREATER SAPHENOUS VEIN HARVESTED ENDOSCOPICALLY (N/A) TRANSESOPHAGEAL ECHOCARDIOGRAM (TEE) (N/A) Subjective: Patient more alert and able to walk 300 feet after reducing narcotics for postoperative pain Hemodynamic stable off inotropes, sinus rhythm Diuresing well Blood sugars controlled  Objective: Vital signs in last 24 hours: Temp:  [97.6 F (36.4 C)-99 F (37.2 C)] 99 F (37.2 C) (10/16 1607) Pulse Rate:  [80-97] 89 (10/16 1600) Cardiac Rhythm: Normal sinus rhythm (10/16 0800) BP: (99-151)/(69-120) 105/77 (10/16 1600) SpO2:  [94 %-99 %] 96 % (10/16 1600) Weight:  [244 lb 11.4 oz (111 kg)] 244 lb 11.4 oz (111 kg) (10/16 0500)  Hemodynamic parameters for last 24 hours: CVP:  [1 mmHg] 1 mmHg  Intake/Output from previous day: 10/15 0701 - 10/16 0700 In: 726 [I.V.:661] Out: 1385 [Urine:1305; Chest Tube:80] Intake/Output this shift: Total I/O In: 508.7 [I.V.:508.7] Out: 2425 [Urine:2425]       Exam    General- alert and comfortable   Lungs- clear without rales, wheezes   Cor- regular rate and rhythm, no murmur , gallop   Abdomen- soft, non-tender   Extremities - warm, non-tender, minimal edema   Neuro- oriented, appropriate, no focal weakness   Lab Results:  Recent Labs  07/16/16 0440 07/17/16 0409  WBC 14.2* 13.6*  HGB 10.3* 10.4*  HCT 31.3* 32.3*  PLT 85* 117*   BMET:  Recent Labs  07/16/16 0440 07/17/16 0409  NA 133* 132*  K 5.2* 4.8  CL 101 97*  CO2 25 28  GLUCOSE 179* 154*  BUN 23* 29*  CREATININE 1.22 1.14  CALCIUM 7.9* 8.2*    PT/INR: No results for input(s): LABPROT, INR in the last 72 hours. ABG    Component Value Date/Time   PHART 7.346 (L) 07/16/2016 1900   HCO3 25.4 07/16/2016 1900   TCO2 27 07/16/2016 1900   ACIDBASEDEF 1.0 07/16/2016 1900   O2SAT 94.0 07/16/2016 1900   CBG (last 3)   Recent  Labs  07/17/16 0750 07/17/16 1137 07/17/16 1606  GLUCAP 154* 240* 177*    Assessment/Plan: S/P Procedure(s) (LRB): CORONARY ARTERY BYPASS GRAFTING (CABG), ON PUMP, TIMES FIVE, USING LEFT INTERNAL MAMMARY ARTERY, RIGHT GREATER SAPHENOUS VEIN HARVESTED ENDOSCOPICALLY (N/A) TRANSESOPHAGEAL ECHOCARDIOGRAM (TEE) (N/A) Continue current care Plan on transfer to stepdown tomorrow   LOS: 7 days    Tharon Aquas Trigt III 07/17/2016

## 2016-07-17 NOTE — Progress Notes (Signed)
Inpatient Diabetes Program Recommendations  AACE/ADA: New Consensus Statement on Inpatient Glycemic Control (2015)  Target Ranges:  Prepandial:   less than 140 mg/dL      Peak postprandial:   less than 180 mg/dL (1-2 hours)      Critically ill patients:  140 - 180 mg/dL   Lab Results  Component Value Date   GLUCAP 240 (H) 07/17/2016   HGBA1C 8.8 (H) 07/11/2016    Review of Glycemic Control  Post-prandial blood sugars > 180 mg/dL.  Inpatient Diabetes Program Recommendations:    Add Novolog 4 units tidwc for meal coverage insulin if pt eats > 50% meal.   Will continue to follow. Thank you. Lorenda Peck, RD, LDN, CDE Inpatient Diabetes Coordinator [phone]

## 2016-07-17 NOTE — Evaluation (Signed)
Physical Therapy Evaluation Patient Details Name: Tommy Sullivan MRN: [id number] DOB: 09-07-1946 Today's Date: 07/17/2016   History of Present Illness  70 yo admitted with CAD and s/p CABGx5. PMHx: CAD, ICM, glaucoma, blind right eye, HTN, HTD, DM, gout, heart failure  Clinical Impression  Pt very pleasant and moving well. Pt able to ambulate in hallway with RW with cues and guarding. Pt educated for all precautions with pt able to state 4/5 end of session. Pt with decreased strength, transfers, gait and awareness of precautions who will benefit from acute therapy to increase gait and independence to decrease burden of care with adherence to precautions.  Encouraged ambulation 3x/day and increased mobility daily with nursing assist.   BP 132/87 before, 151/120 after sats 100% on 4L with 97% on 2L HR 92-98    Follow Up Recommendations Home health PT    Equipment Recommendations  Rolling walker with 5" wheels;3in1 (PT)    Recommendations for Other Services       Precautions / Restrictions Precautions Precautions: Sternal;Fall      Mobility  Bed Mobility               General bed mobility comments: in chair on arrival  Transfers Overall transfer level: Needs assistance   Transfers: Sit to/from Stand Sit to Stand: Min assist         General transfer comment: cues for hand placement, safety and sequence with min assist to rise from surface  Ambulation/Gait Ambulation/Gait assistance: Min guard Ambulation Distance (Feet): 300 Feet Assistive device: Rolling walker (2 wheeled) Gait Pattern/deviations: Step-through pattern;Decreased stride length;Trunk flexed   Gait velocity interpretation: Below normal speed for age/gender General Gait Details: cues for posture and position in RW with one standing rest during gait  Stairs            Wheelchair Mobility    Modified Rankin (Stroke Patients Only)       Balance Overall balance assessment: Needs  assistance   Sitting balance-Leahy Scale: Fair       Standing balance-Leahy Scale: Poor                               Pertinent Vitals/Pain Pain Assessment: No/denies pain    Home Living Family/patient expects to be discharged to:: Private residence Living Arrangements: Spouse/significant other Available Help at Discharge: Family;Available 24 hours/day Type of Home: House Home Access: Level entry     Home Layout: Multi-level;Able to live on main level with bedroom/bathroom Home Equipment: None      Prior Function Level of Independence: Independent               Hand Dominance        Extremity/Trunk Assessment   Upper Extremity Assessment: Overall WFL for tasks assessed           Lower Extremity Assessment: Overall WFL for tasks assessed      Cervical / Trunk Assessment: Kyphotic  Communication   Communication: No difficulties  Cognition Arousal/Alertness: Awake/alert Behavior During Therapy: WFL for tasks assessed/performed Overall Cognitive Status: Within Functional Limits for tasks assessed       Memory: Decreased recall of precautions              General Comments      Exercises     Assessment/Plan    PT Assessment Patient needs continued PT services  PT Problem List Decreased activity tolerance;Decreased balance;Decreased knowledge of use of DME;Decreased  knowledge of precautions;Decreased mobility          PT Treatment Interventions Gait training;Functional mobility training;Therapeutic exercise;DME instruction;Therapeutic activities;Patient/family education    PT Goals (Current goals can be found in the Care Plan section)  Acute Rehab PT Goals Patient Stated Goal: play in poker tournament in [place] in 4 weeks PT Goal Formulation: With patient Time For Goal Achievement: 07/31/16 Potential to Achieve Goals: Good    Frequency Min 3X/week   Barriers to discharge        Co-evaluation               End  of Session Equipment Utilized During Treatment: Gait belt;Oxygen Activity Tolerance: Patient tolerated treatment well Patient left: in chair;with call bell/phone within reach;with family/visitor present Nurse Communication: Mobility status;Precautions         Time: [id number] PT Time Calculation (min) (ACUTE ONLY): 21 min   Charges:   PT Evaluation $PT Eval Moderate Complexity: 1 Procedure     PT G CodesMelford Aase 07/17/2016, 12:49 PM Elwyn Reach, [place]

## 2016-07-17 NOTE — Progress Notes (Signed)
      [address]       [place] [address]             [phone]   Evening rounds  POD # 3 CABG   Up in chair, no complaints  BP 105/77   Pulse 89   Temp 99 F (37.2 C) (Oral)   Resp (!) 6   Ht 5' 9.5" (1.765 m)   Wt 244 lb 11.4 oz (111 kg)   SpO2 96%   BMI 35.62 kg/m    Intake/Output Summary (Last 24 hours) at 07/17/16 1836 Last data filed at 07/17/16 1700  Gross per 24 hour  Intake          1113.51 ml  Output             3250 ml  Net         -2136.49 ml    CBG 240 at 1130 down to 177 at 4 PM  Continue present care  Remo Lipps C. Roxan Hockey, MD [facility] [phone]

## 2016-07-17 NOTE — Op Note (Signed)
NAMEROYEL, BORSTAD NO.:  [id number]  MEDICAL RECORD NO.:  [id number]  LOCATION:                                 FACILITY:  PHYSICIAN:  Ivin Poot, M.D.  DATE OF BIRTH:  1946-09-26  DATE OF PROCEDURE: DATE OF DISCHARGE:                              OPERATIVE REPORT   OPERATION: 1. Coronary artery bypass grafting x5 (left internal mammary artery to     LAD, saphenous vein graft to diagonal, saphenous vein graft to     ramus intermediate, saphenous vein graft to posterior descending-     posterolateral branches of the distal RCA). 2. Endoscopic harvest of right leg greater saphenous vein.  SURGEON:  Ivin Poot, M.D.  ASSISTANT:  Nicholes Rough, PA-C.  ANESTHESIA:  General.  PREOPERATIVE DIAGNOSIS:  Class 4 unstable angina with severe diabetic three-vessel coronary artery disease.  POSTOPERATIVE DIAGNOSIS:  Class 4 unstable angina with severe diabetic three-vessel coronary artery disease.  CLINICAL NOTE:  The patient is a 70 year old obese type 2 diabetic, nonsmoker, who presents with symptoms of unstable angina and dyspnea on exertion.  Cardiac catheterization demonstrated severe multivessel CAD in a diabetic pattern with elevated LVEDP and at least mild LV dysfunction.  Echo showed no significant valvular disease.  He was felt to be a candidate for surgical revascularization and I saw the patient in consultation.  I discussed the results of his cardiac catheterization and went over the Cardiology recommendation for CABG with which I agreed.  I discussed the expected benefits of CABG for treatment of his severe coronary artery disease as well as the alternatives to surgery and the expected postoperative recovery.  I discussed with the patient risks to him of the operation including risks of stroke, MI, bleeding requiring blood transfusion, postoperative pulmonary problems including pleural effusion, postop infection, and death.  He understood  that his vessels were severely diseased and suboptimal targets for grafting, but that surgical revascularization would be his best long-term therapy. After reviewing these issues, he demonstrated his understanding and agreed to proceed with surgery under what I felt was an informed consent.  OPERATIVE FINDINGS: 1. Adequate conduit with some dilatation of the saphenous vein. 2. Very difficult targets for grafting with diffuse diabetic pattern     of disease. 3. Severe LVH and difficult exposure of the vessels on the posterior     wall of the heart.  OPERATIVE PROCEDURE:  The patient was brought to the operating room and placed supine on the operating table.  General anesthesia was induced under invasive hemodynamic monitoring.  The chest, abdomen, and legs were prepped with Betadine and draped as a sterile field.  A proper time- out was performed.  A sternal incision was made as the saphenous vein was harvested endoscopically from the right leg.  The left internal mammary artery was harvested as a pedicle graft from its origin at the subclavian vessels.  This was a tedious and time consuming because of the stiffness of the patient's thoracic wall.  Internal mammary artery was a 1.5-mm vessel somewhat thickened, but with satisfactory flow.  The sternal retractor was placed.  The pericardium was opened and suspended. Pursestrings were placed in ascending aorta  and right atrium.  When the vein was harvested, the patient was fully heparinized and the ACT was documented as being therapeutic.  The patient was cannulated and placed on cardiopulmonary bypass.  The coronaries were identified for grafting. The PD and PL were suboptimal targets.  The LAD had diffuse disease. The ramus and diagonal vessels were adequate targets.  Cardioplegia cannulas were placed both antegrade and retrograde cold blood cardioplegia.  The patient was cooled to 32 degrees.  The aortic crossclamp was applied.  A  liter of cold blood cardioplegia was delivered in split doses between the antegrade aortic and retrograde coronary sinus catheters.  There was good cardioplegic arrest, and septal temperature dropped less than 12 degrees.  Cardioplegia was delivered every 20 minutes.  The distal coronary anastomoses were performed.  The first distal anastomosis was the sequential vein graft to the posterior descending and posterolateral.  The posterior descending was 1.5-mm vessel with proximal 90% stenosis.  It was heavily diseased.  The vein was sewn side- to-side with running 7-0 Prolene.  The second distal anastomosis was the continuation of the vein graft to the posterior lateral.  This was also a small heavily diseased vessel 1.4 mm in diameter.  The end of vein was sewn end-to-side with running 7- 0 Prolene.  There was good flow through the graft through the sequential vein graft.  Cardioplegia was redosed.  The third distal anastomosis was the ramus branch of the left coronary. This had a proximal 70% stenosis.  Reverse saphenous vein was sewn end- to-side with running 7-0 Prolene with good flow through the graft. Cardioplegia was redosed.  The fourth distal anastomosis was to the diagonal branch to the LAD.  This had the ostial 90% stenosis.  A reverse saphenous vein was sewn end-to-side with running 7-0 Prolene with good flow through the graft.  Cardioplegia was redosed.  The fifth distal anastomosis was the distal third of the LAD.  The area was 1.5-mm vessel with proximal 95% stenosis.  The left IMA pedicle was brought through an opening in the left lateral pericardium was brought down onto the LAD and sewn end-to-side with running 8-0 Prolene.  There was good flow through the anastomosis after briefly releasing the pedicle bulldog on the mammary artery.  The bulldog was reapplied. Cardioplegia was redosed.  With the crossclamp still in place, 3 proximal vein anastomoses were performed  on the ascending aorta with a 4.5 mm punch running 6-0 Prolene.  Prior to removal of the crossclamp air was vented from the coronaries with a dose of retrograde warm blood cardioplegia.  The crossclamp was removed.  The heart resumed a spontaneous rhythm. The vein grafts were de-aired and opened.  Each had good flow. Hemostasis was documented at the proximal distal anastomoses.  The patient was rewarmed and reperfused.  The lungs were expanded. Ventilator was resumed.  The patient was started on low-dose milrinone and dopamine and weaned off cardiopulmonary bypass without difficulty. Echo showed preserved LV function.  Protamine was administered without adverse reaction.  The cannulas were removed.  The mediastinum was irrigated.  The superior pericardial fat was closed over the aorta. Anterior mediastinal and left pleural chest tubes were placed and brought out through separate incisions.  The sternum was closed with wire.  The pectoralis fascia was closed with a running #1 Vicryl.  The subcu and skin layers were closed in running Vicryl and sterile dressings were applied.  Total cardiopulmonary bypass time was 170 minutes.  Ivin Poot, M.D.   ______________________________ Ivin Poot, M.D.    PV/MEDQ  D:  07/15/2016  T:  07/16/2016  Job:  [id number]  cc:   Kathlyn Sacramento, MD

## 2016-07-17 NOTE — Progress Notes (Signed)
Patient Name: Tommy Sullivan Date of Encounter: 07/17/2016  Primary Cardiologist: Dr. Antionette Char Problem List     Active Problems:   Unstable angina (HCC)   S/P CABG x 5     Subjective   Yesterday was quite sedate, narcotics have been reduced. More alert. In chair. No CP, no SOB  Inpatient Medications    Scheduled Meds: . acetaminophen  1,000 mg Oral Q6H   Or  . acetaminophen (TYLENOL) oral liquid 160 mg/5 mL  1,000 mg Per Tube Q6H  . aspirin EC  325 mg Oral Daily   Or  . aspirin  324 mg Per Tube Daily  . bisacodyl  10 mg Oral Daily   Or  . bisacodyl  10 mg Rectal Daily  . budesonide (PULMICORT) nebulizer solution  0.5 mg Nebulization BID  . docusate sodium  200 mg Oral Daily  . furosemide  40 mg Intravenous Daily  . insulin aspart  0-24 Units Subcutaneous Q4H  . insulin detemir  20 Units Subcutaneous BID  . levalbuterol  1.25 mg Nebulization Q6H  . mouth rinse  15 mL Mouth Rinse BID  . metoCLOPramide (REGLAN) injection  10 mg Intravenous Q6H  . metolazone  5 mg Oral Daily  . metoprolol tartrate  12.5 mg Oral BID   Or  . metoprolol tartrate  12.5 mg Per Tube BID  . pantoprazole  40 mg Oral Daily  . rosuvastatin  10 mg Oral QHS  . sodium chloride flush  3 mL Intravenous Q12H   Continuous Infusions: . sodium chloride Stopped (07/16/16 2000)  . sodium chloride 250 mL (07/15/16 2000)  . sodium chloride 20 mL/hr at 07/17/16 0700  . lactated ringers Stopped (07/16/16 2000)  . lactated ringers Stopped (07/16/16 2000)  . milrinone 0.25 mcg/kg/min (07/17/16 0700)   PRN Meds: sodium chloride, albumin human, metoprolol, midazolam, morphine injection, ondansetron (ZOFRAN) IV, oxyCODONE, sodium chloride flush, traMADol   Vital Signs    Vitals:   07/17/16 0700 07/17/16 0757 07/17/16 0810 07/17/16 0812  BP: 99/69     Pulse: 82     Resp:      Temp:  98.3 F (36.8 C)    TempSrc:  Oral    SpO2: 97%  97% 98%  Weight:      Height:        Intake/Output  Summary (Last 24 hours) at 07/17/16 0832 Last data filed at 07/17/16 0700  Gross per 24 hour  Intake              746 ml  Output             1260 ml  Net             -514 ml   Filed Weights   07/15/16 0339 07/16/16 0700 07/17/16 0500  Weight: 245 lb 6 oz (111.3 kg) 247 lb 12.8 oz (112.4 kg) 244 lb 11.4 oz (111 kg)    Physical Exam    GEN: Well nourished, well developed, in no acute distress.  HEENT: Grossly normal.  Neck: Supple, no JVD, carotid bruits, or masses. Cardiac: RRR, no murmurs, rubs, or gallops. No clubbing, cyanosis, edema.  Radials/DP/PT 2+ and equal bilaterally.  Respiratory:  Respirations regular and unlabored, clear to auscultation bilaterally. GI: Soft, nontender, nondistended, BS + x 4. MS: no deformity or atrophy. Skin: warm and dry, no rash. Neuro:  Strength and sensation are intact. Psych: AAOx3.  Normal affect.  Labs    CBC  Recent  Labs  07/16/16 0440 07/17/16 0409  WBC 14.2* 13.6*  HGB 10.3* 10.4*  HCT 31.3* 32.3*  MCV 89.7 91.2  PLT 85* [id number]*   Basic Metabolic Panel  Recent Labs  07/15/16 0418  07/15/16 1707 07/16/16 0440 07/17/16 0409  NA 137  < >  --  133* 132*  K 4.1  < >  --  5.2* 4.8  CL 105  < >  --  101 97*  CO2 23  --   --  25 28  GLUCOSE 133*  < >  --  179* 154*  BUN 15  < >  --  23* 29*  CREATININE 1.25*  < > 1.27* 1.22 1.14  CALCIUM 8.3*  --   --  7.9* 8.2*  MG 2.4  --  2.3  --   --   < > = values in this interval not displayed.   Telemetry    NSR - Personally Reviewed  ECG    Accelerated junctional rhyhtm - Personally Reviewed  Radiology    Dg Chest Port 1 View  Result Date: 07/17/2016 CLINICAL DATA:  Chest soreness, recent CABG. EXAM: PORTABLE CHEST 1 VIEW COMPARISON:  07/16/2016 FINDINGS: Right IJ introducer sheath satisfactorily position. Median sternotomy wires appear normally aligned without wire migration. CABG markers noted. Left basilar chest tube and mediastinal drain have been removed. I do not see a  pneumothorax. Mild enlargement of the cardiopericardial silhouette. Mildly reduced subsegmental atelectasis at the left lung base. Improved lung volumes. Persistent left suprahilar atelectasis, bandlike extension towards the aortic arch. IMPRESSION: 1. Left basilar chest tube is been removed. No pneumothorax identified. Improved overall lung volumes with reduced atelectasis at the left lung base. 2. Mild residual atelectasis in the left suprahilar region. 3. Bright introducer sheath remains in place. Electronically Signed   By: Van Clines M.D.   On: 07/17/2016 07:56   Dg Chest Port 1 View  Result Date: 07/16/2016 CLINICAL DATA:  CABG. EXAM: PORTABLE CHEST 1 VIEW COMPARISON:  07/15/2016 FINDINGS: 0549 hours. Pulmonary artery catheter has been removed in the interval with right IJ sheath visualized. Left chest tube remains in place without evidence for left-sided pneumothorax. Midline mediastinal/ pericardial drain again noted. The cardio pericardial silhouette is enlarged. Vascular congestion noted with bibasilar atelectasis. Gaseous bowel distention noted left upper quadrant. IMPRESSION: 1. Interval removal of pulmonary artery catheter. 2. Otherwise stable exam. Electronically Signed   By: Misty Stanley M.D.   On: 07/16/2016 07:55    Cardiac Studies   ECHO: 07/12/16 - Left ventricle: The cavity size was normal. Wall thickness was   normal. Systolic function was mildly reduced. The estimated   ejection fraction was in the range of 45% to 50%. There is   akinesis of the basalinferior myocardium. - Aortic valve: Trileaflet; mildly thickened, mildly calcified   leaflets. - Left atrium: The atrium was moderately dilated. - Tricuspid valve: There was mild regurgitation.  Impressions:  - EF is mildly improved when compared to prior study.  Patient Profile     70 year old with CABG, severe CAD, unstable angina, ischemic cardiomyopathy.   Assessment & Plan    Unstable angina: Cardiac  catheterization showed severe three-vessel coronary artery disease with reduced LV systolic function [id number].   CABG - Dr. Lucianne Lei Trigt    Acute on chronic systolic heart failure: His LVEDP was moderately elevated.     - Bb, add ACE-I when able. BP soft. Weaning off milrinone.   Status post atrial flutter ablation:  Maintaining NSR.   Hyperlipidemia: Continue treatment with rosuvastatin.  Type 2 diabetes:   Hold metformin. Sliding scale coverage.  Sleep apnea:    On CPAP.   Signed, Candee Furbish, MD  07/17/2016, 8:32 AM

## 2016-07-18 ENCOUNTER — Encounter (HOSPITAL_COMMUNITY): Payer: Self-pay | Admitting: Student

## 2016-07-18 ENCOUNTER — Inpatient Hospital Stay (HOSPITAL_COMMUNITY): Payer: 59

## 2016-07-18 DIAGNOSIS — I480 Paroxysmal atrial fibrillation: Secondary | ICD-10-CM

## 2016-07-18 LAB — GLUCOSE, CAPILLARY
GLUCOSE-CAPILLARY: 120 mg/dL — AB (ref 65–99)
GLUCOSE-CAPILLARY: 145 mg/dL — AB (ref 65–99)
GLUCOSE-CAPILLARY: 147 mg/dL — AB (ref 65–99)
GLUCOSE-CAPILLARY: 168 mg/dL — AB (ref 65–99)
Glucose-Capillary: 106 mg/dL — ABNORMAL HIGH (ref 65–99)
Glucose-Capillary: 143 mg/dL — ABNORMAL HIGH (ref 65–99)
Glucose-Capillary: 154 mg/dL — ABNORMAL HIGH (ref 65–99)

## 2016-07-18 LAB — BASIC METABOLIC PANEL
Anion gap: 8 (ref 5–15)
BUN: 23 mg/dL — ABNORMAL HIGH (ref 6–20)
CO2: 29 mmol/L (ref 22–32)
Calcium: 8.2 mg/dL — ABNORMAL LOW (ref 8.9–10.3)
Chloride: 95 mmol/L — ABNORMAL LOW (ref 101–111)
Creatinine, Ser: 1.02 mg/dL (ref 0.61–1.24)
GFR calc Af Amer: >60 mL/min (ref >60)
GFR calc non Af Amer: >60 mL/min (ref >60)
Glucose, Bld: 125 mg/dL — ABNORMAL HIGH (ref 65–99)
Potassium: 3.7 mmol/L (ref 3.5–5.1)
Sodium: 132 mmol/L — ABNORMAL LOW (ref 135–145)

## 2016-07-18 LAB — CARBOXYHEMOGLOBIN - COOX

## 2016-07-18 LAB — CBC
HCT: 31.8 % — ABNORMAL LOW (ref 39.0–52.0)
Hemoglobin: 10.5 g/dL — ABNORMAL LOW (ref 13.0–17.0)
MCH: 29.7 pg (ref 26.0–34.0)
MCHC: 33 g/dL (ref 30.0–36.0)
MCV: 89.8 fL (ref 78.0–100.0)
Platelets: 125 10*3/uL — ABNORMAL LOW (ref 150–400)
RBC: 3.54 MIL/uL — ABNORMAL LOW (ref 4.22–5.81)
RDW: 14.1 % (ref 11.5–15.5)
WBC: 10.4 10*3/uL (ref 4.0–10.5)

## 2016-07-18 MED ORDER — ENOXAPARIN SODIUM 40 MG/0.4ML ~~LOC~~ SOLN
40.0000 mg | SUBCUTANEOUS | Status: DC
  Administered 2016-07-18 – 2016-07-22 (×5): 40 mg via SUBCUTANEOUS
  Filled 2016-07-18 (×5): qty 0.4

## 2016-07-18 MED ORDER — AMIODARONE LOAD VIA INFUSION
150.0000 mg | Freq: Once | INTRAVENOUS | Status: AC
  Administered 2016-07-18: 150 mg via INTRAVENOUS

## 2016-07-18 MED ORDER — AMIODARONE HCL IN DEXTROSE 360-4.14 MG/200ML-% IV SOLN
30.0000 mg/h | INTRAVENOUS | Status: DC
  Administered 2016-07-18 (×2): 30 mg/h via INTRAVENOUS
  Filled 2016-07-18 (×2): qty 200

## 2016-07-18 MED ORDER — AMIODARONE LOAD VIA INFUSION
150.0000 mg | Freq: Once | INTRAVENOUS | Status: AC
  Administered 2016-07-18: 150 mg via INTRAVENOUS
  Filled 2016-07-18: qty 83.34

## 2016-07-18 MED ORDER — AMIODARONE HCL IN DEXTROSE 360-4.14 MG/200ML-% IV SOLN
60.0000 mg/h | INTRAVENOUS | Status: DC
  Administered 2016-07-18: 60 mg/h via INTRAVENOUS
  Filled 2016-07-18 (×2): qty 200

## 2016-07-18 MED ORDER — POTASSIUM CHLORIDE 10 MEQ/50ML IV SOLN
10.0000 meq | INTRAVENOUS | Status: AC
  Administered 2016-07-18 (×3): 10 meq via INTRAVENOUS
  Filled 2016-07-18 (×3): qty 50

## 2016-07-18 NOTE — Progress Notes (Signed)
Patient Name: Tommy Sullivan Date of Encounter: 07/18/2016  Primary Cardiologist: Dr. Antionette Char Problem List     Active Problems:   Unstable angina (HCC)   S/P CABG x 5     Subjective    More alert. In chair. No CP, no SOB  Inpatient Medications    Scheduled Meds: . acetaminophen  1,000 mg Oral Q6H   Or  . acetaminophen (TYLENOL) oral liquid 160 mg/5 mL  1,000 mg Per Tube Q6H  . aspirin EC  325 mg Oral Daily   Or  . aspirin  324 mg Per Tube Daily  . bisacodyl  10 mg Oral Daily   Or  . bisacodyl  10 mg Rectal Daily  . budesonide (PULMICORT) nebulizer solution  0.5 mg Nebulization BID  . docusate sodium  200 mg Oral Daily  . furosemide  40 mg Intravenous Daily  . insulin aspart  0-24 Units Subcutaneous Q4H  . insulin detemir  20 Units Subcutaneous BID  . latanoprost  1 drop Left Eye QHS  . mouth rinse  15 mL Mouth Rinse BID  . metoCLOPramide (REGLAN) injection  10 mg Intravenous Q6H  . metoprolol tartrate  12.5 mg Oral BID   Or  . metoprolol tartrate  12.5 mg Per Tube BID  . pantoprazole  40 mg Oral Daily  . rosuvastatin  10 mg Oral QHS  . sodium chloride flush  3 mL Intravenous Q12H   Continuous Infusions: . sodium chloride Stopped (07/16/16 2000)  . sodium chloride 250 mL (07/18/16 0800)  . sodium chloride Stopped (07/17/16 0700)  . amiodarone 30 mg/hr (07/18/16 0800)  . lactated ringers Stopped (07/16/16 2000)  . lactated ringers Stopped (07/16/16 2000)  . milrinone 0.125 mcg/kg/min (07/18/16 0800)   PRN Meds: sodium chloride, albumin human, levalbuterol, metoprolol, morphine injection, ondansetron (ZOFRAN) IV, oxyCODONE, sodium chloride flush, traMADol   Vital Signs    Vitals:   07/18/16 0700 07/18/16 0800 07/18/16 0827 07/18/16 0900  BP: 109/68 112/79  116/85  Pulse: 94 (!) 44  (!) 45  Resp:      Temp: 98.3 F (36.8 C)     TempSrc: Oral     SpO2: 96% 98% 94% 100%  Weight:      Height:        Intake/Output Summary (Last 24 hours)  at 07/18/16 1001 Last data filed at 07/18/16 0800  Gross per 24 hour  Intake          1945.62 ml  Output             3135 ml  Net         -1189.38 ml   Filed Weights   07/16/16 0700 07/17/16 0500 07/18/16 0500  Weight: 247 lb 12.8 oz (112.4 kg) 244 lb 11.4 oz (111 kg) 240 lb 1.3 oz (108.9 kg)    Physical Exam    GEN: Well nourished, well developed, in no acute distress.  HEENT: Grossly normal.  Neck: Supple, no JVD, carotid bruits, or masses. Cardiac: irreg irreg, no murmurs, rubs, or gallops. No clubbing, cyanosis, edema.  Radials/DP/PT 2+ and equal bilaterally.  Respiratory:  Respirations regular and unlabored, clear to auscultation bilaterally. GI: Soft, nontender, nondistended, BS + x 4. MS: no deformity or atrophy. Skin: warm and dry, no rash. Neuro:  Strength and sensation are intact. Psych: AAOx3.  Normal affect.  Labs    CBC  Recent Labs  07/17/16 0409 07/18/16 0425  WBC 13.6* 10.4  HGB 10.4* 10.5*  HCT 32.3* 31.8*  MCV 91.2 89.8  PLT 117* 0000000*   Basic Metabolic Panel  Recent Labs  07/15/16 1707  07/17/16 0409 07/18/16 0425  NA  --   < > 132* 132*  K  --   < > 4.8 3.7  CL  --   < > 97* 95*  CO2  --   < > 28 29  GLUCOSE  --   < > 154* 125*  BUN  --   < > 29* 23*  CREATININE 1.27*  < > 1.14 1.02  CALCIUM  --   < > 8.2* 8.2*  MG 2.3  --   --   --   < > = values in this interval not displayed.   Telemetry    NSR - Personally Reviewed  ECG    Accelerated junctional rhyhtm - Personally Reviewed  Radiology    Dg Chest Port 1 View  Result Date: 07/18/2016 CLINICAL DATA:  Shortness of breath, post CABG EXAM: PORTABLE CHEST 1 VIEW COMPARISON:  Portable chest x-ray of 07/17/2016 FINDINGS: There is little change in aeration with minimal left basilar atelectasis. A venous sheath remains in the SVC and no pneumothorax is noted. Heart size is stable. Median sternotomy sutures are noted. IMPRESSION: 1. Little change in aeration with minimal left basilar  atelectasis. 2. Venous sheath remains in the SVC. Electronically Signed   By: Ivar Drape M.D.   On: 07/18/2016 08:28   Dg Chest Port 1 View  Result Date: 07/17/2016 CLINICAL DATA:  Chest soreness, recent CABG. EXAM: PORTABLE CHEST 1 VIEW COMPARISON:  07/16/2016 FINDINGS: Right IJ introducer sheath satisfactorily position. Median sternotomy wires appear normally aligned without wire migration. CABG markers noted. Left basilar chest tube and mediastinal drain have been removed. I do not see a pneumothorax. Mild enlargement of the cardiopericardial silhouette. Mildly reduced subsegmental atelectasis at the left lung base. Improved lung volumes. Persistent left suprahilar atelectasis, bandlike extension towards the aortic arch. IMPRESSION: 1. Left basilar chest tube is been removed. No pneumothorax identified. Improved overall lung volumes with reduced atelectasis at the left lung base. 2. Mild residual atelectasis in the left suprahilar region. 3. Bright introducer sheath remains in place. Electronically Signed   By: Van Clines M.D.   On: 07/17/2016 07:56    Cardiac Studies   ECHO: 07/12/16 - Left ventricle: The cavity size was normal. Wall thickness was   normal. Systolic function was mildly reduced. The estimated   ejection fraction was in the range of 45% to 50%. There is   akinesis of the basalinferior myocardium. - Aortic valve: Trileaflet; mildly thickened, mildly calcified   leaflets. - Left atrium: The atrium was moderately dilated. - Tricuspid valve: There was mild regurgitation.  Impressions:  - EF is mildly improved when compared to prior study.  Patient Profile     70 year old with CABG, severe CAD, unstable angina, ischemic cardiomyopathy.   Assessment & Plan    Unstable angina: Cardiac catheterization showed severe three-vessel coronary artery disease with reduced LV systolic function [id number].   CABG - Dr. Prescott Gum   Post op AFIB  - metop  - rate controlled  -  IV AMIO currently  - ECG from 07/18/16 at 0043 shows AFIB  - If continues will need to consider anticoagulation.   Acute on chronic systolic heart failure: His LVEDP was moderately elevated.     - Bb, add ACE-I when able. BP soft. Weaning off milrinone.   Status post atrial flutter  ablation:  Maintaining NSR.   Hyperlipidemia: Continue treatment with rosuvastatin.  Type 2 diabetes:   Hold metformin. Sliding scale coverage.  Sleep apnea:    On CPAP.   Signed, Candee Furbish, MD  07/18/2016, 10:01 AM

## 2016-07-18 NOTE — Progress Notes (Signed)
Notified MD Roxan Hockey of patient in Atrial Fibrillation rate controlled with stable BP. Orders received. Will continue to monitor patient.  Levon Hedger, RN

## 2016-07-18 NOTE — Progress Notes (Signed)
Transferred to 2W23 via wheelchair. Portable monitor on. No changes.

## 2016-07-18 NOTE — Progress Notes (Signed)
Patient refuses CPAP for the night. RT will continue to monitor.  

## 2016-07-18 NOTE — Progress Notes (Signed)
Pt. Arrived to 2W-23 pt. Oriented to room, pt. Advised to call nurse when needed, VS taken, tele applied, CCMD notified

## 2016-07-18 NOTE — Progress Notes (Signed)
4 Days Post-Op Procedure(s) (LRB): CORONARY ARTERY BYPASS GRAFTING (CABG), ON PUMP, TIMES FIVE, USING LEFT INTERNAL MAMMARY ARTERY, RIGHT GREATER SAPHENOUS VEIN HARVESTED ENDOSCOPICALLY (N/A) TRANSESOPHAGEAL ECHOCARDIOGRAM (TEE) (N/A) Subjective: Patient feels stronger, walking hallway but now in atrial fibrillation Started on IV amiodarone protocol last night Chest x-ray clear Blood pressure stable  Objective: Vital signs in last 24 hours: Temp:  [97.3 F (36.3 C)-98.8 F (37.1 C)] 97.5 F (36.4 C) (10/17 1449) Pulse Rate:  [44-110] 59 (10/17 1449) Cardiac Rhythm: Normal sinus rhythm (10/17 0800) BP: (91-131)/(63-94) 100/70 (10/17 1449) SpO2:  [92 %-100 %] 98 % (10/17 1449) Weight:  [240 lb 1.3 oz (108.9 kg)] 240 lb 1.3 oz (108.9 kg) (10/17 0500)  Hemodynamic parameters for last 24 hours:  sinus  Intake/Output from previous day: 10/16 0701 - 10/17 0700 In: 2438.4 [P.O.:1160; I.V.:1228.4; IV Piggyback:50] Out: [id number] [Urine:3775] Intake/Output this shift: Total I/O In: 231.8 [P.O.:120; I.V.:111.8] Out: 935 [Urine:935]       Exam    General- alert and comfortable   Lungs- clear without rales, wheezes   Cor- regular rate and rhythm, no murmur , gallop   Abdomen- soft, non-tender   Extremities - warm, non-tender, minimal edema   Neuro- oriented, appropriate, no focal weakness   Lab Results:  Recent Labs  07/17/16 0409 07/18/16 0425  WBC 13.6* 10.4  HGB 10.4* 10.5*  HCT 32.3* 31.8*  PLT 117* 125*   BMET:  Recent Labs  07/17/16 0409 07/18/16 0425  NA 132* 132*  K 4.8 3.7  CL 97* 95*  CO2 28 29  GLUCOSE 154* 125*  BUN 29* 23*  CREATININE 1.14 1.02  CALCIUM 8.2* 8.2*    PT/INR: No results for input(s): LABPROT, INR in the last 72 hours. ABG    Component Value Date/Time   PHART 7.346 (L) 07/16/2016 1900   HCO3 25.4 07/16/2016 1900   TCO2 27 07/16/2016 1900   ACIDBASEDEF 1.0 07/16/2016 1900   O2SAT 94.0 07/16/2016 1900   CBG (last 3)   Recent  Labs  07/18/16 0800 07/18/16 1155 07/18/16 1659  GLUCAP 143* 168* 147*    Assessment/Plan: S/P Procedure(s) (LRB): CORONARY ARTERY BYPASS GRAFTING (CABG), ON PUMP, TIMES FIVE, USING LEFT INTERNAL MAMMARY ARTERY, RIGHT GREATER SAPHENOUS VEIN HARVESTED ENDOSCOPICALLY (N/A) TRANSESOPHAGEAL ECHOCARDIOGRAM (TEE) (N/A) Diuresis Plan for transfer to step-down: see transfer orders Start low-dose Lovenox, hold on Coumadin for now   LOS: 8 days    Tharon Aquas Trigt III 07/18/2016

## 2016-07-19 ENCOUNTER — Inpatient Hospital Stay (HOSPITAL_COMMUNITY): Payer: 59

## 2016-07-19 DIAGNOSIS — I4892 Unspecified atrial flutter: Secondary | ICD-10-CM

## 2016-07-19 LAB — GLUCOSE, CAPILLARY
GLUCOSE-CAPILLARY: 137 mg/dL — AB (ref 65–99)
GLUCOSE-CAPILLARY: 97 mg/dL (ref 65–99)
Glucose-Capillary: 120 mg/dL — ABNORMAL HIGH (ref 65–99)
Glucose-Capillary: 106 mg/dL — ABNORMAL HIGH (ref 65–99)

## 2016-07-19 LAB — BASIC METABOLIC PANEL
Anion gap: 9 (ref 5–15)
BUN: 36 mg/dL — AB (ref 6–20)
CALCIUM: 8.2 mg/dL — AB (ref 8.9–10.3)
CHLORIDE: 91 mmol/L — AB (ref 101–111)
CO2: 31 mmol/L (ref 22–32)
CREATININE: 1.37 mg/dL — AB (ref 0.61–1.24)
GFR calc Af Amer: 59 mL/min — ABNORMAL LOW (ref >60)
GFR calc non Af Amer: 51 mL/min — ABNORMAL LOW (ref >60)
GLUCOSE: 114 mg/dL — AB (ref 65–99)
Potassium: 4 mmol/L (ref 3.5–5.1)
Sodium: 131 mmol/L — ABNORMAL LOW (ref 135–145)

## 2016-07-19 LAB — MAGNESIUM: Magnesium: 2.2 mg/dL (ref 1.7–2.4)

## 2016-07-19 LAB — CBC
HEMATOCRIT: 32.2 % — AB (ref 39.0–52.0)
HEMOGLOBIN: 10.6 g/dL — AB (ref 13.0–17.0)
MCH: 29.1 pg (ref 26.0–34.0)
MCHC: 32.9 g/dL (ref 30.0–36.0)
MCV: 88.5 fL (ref 78.0–100.0)
Platelets: 165 10*3/uL (ref 150–400)
RBC: 3.64 MIL/uL — ABNORMAL LOW (ref 4.22–5.81)
RDW: 14.1 % (ref 11.5–15.5)
WBC: 10.6 10*3/uL — ABNORMAL HIGH (ref 4.0–10.5)

## 2016-07-19 MED ORDER — POTASSIUM CHLORIDE ER 10 MEQ PO TBCR
30.0000 meq | EXTENDED_RELEASE_TABLET | Freq: Every day | ORAL | Status: DC
  Administered 2016-07-19 – 2016-07-20 (×2): 30 meq via ORAL
  Filled 2016-07-19 (×5): qty 3

## 2016-07-19 MED ORDER — AMIODARONE HCL 200 MG PO TABS
400.0000 mg | ORAL_TABLET | Freq: Two times a day (BID) | ORAL | Status: DC
  Administered 2016-07-19 – 2016-07-23 (×9): 400 mg via ORAL
  Filled 2016-07-19 (×9): qty 2

## 2016-07-19 MED ORDER — GUAIFENESIN ER 600 MG PO TB12
1200.0000 mg | ORAL_TABLET | Freq: Two times a day (BID) | ORAL | Status: DC
  Administered 2016-07-19 – 2016-07-23 (×9): 1200 mg via ORAL
  Filled 2016-07-19 (×9): qty 2

## 2016-07-19 MED ORDER — FUROSEMIDE 10 MG/ML IJ SOLN
40.0000 mg | Freq: Once | INTRAMUSCULAR | Status: AC
  Administered 2016-07-19: 40 mg via INTRAVENOUS

## 2016-07-19 NOTE — Progress Notes (Signed)
CARDIAC REHAB PHASE I   PRE:  Rate/Rhythm: 83 bigeminy    BP: sitting to BR    SaO2: 96 RA  MODE:  Ambulation: 150 ft   POST:  Rate/Rhythm: 69 ? afib    BP: sitting 64/42, recheck 119/69     SaO2: 98 RA  Pt to BR then willing to try to walk. Rhythm before and during walk appears to be 80s bigeminy. After 120 ft pt began coughing then suddenly felt significantly SOB "like earlier". Pt stated he felt faint and had to sit before getting to bed. HR noted 69, no longer bigeminy, ? Afib. BP 64/42. HR up again to 80s bigeminy and recheck BP 119/69. With reviewing alarms on telemetry, pt did have 8 bts VT at some point, probably while sitting. Pt to bed, BP now stable, HR back to 80s bigeminy, pt feeling better.  [place], ACSM 07/19/2016 2:56 PM

## 2016-07-19 NOTE — Discharge Summary (Signed)
Physician Discharge Summary  Patient ID: Tommy Sullivan MRN: [id number] DOB/AGE: 70/27/1947 70 y.o.  Admit date: 07/10/2016 Discharge date: 07/23/2016  Admission Diagnoses: Patient Active Problem List   Diagnosis Date Noted  . PAF (paroxysmal atrial fibrillation) ([facility]) 07/22/2016  . Acute systolic congestive heart failure, NYHA class 4 ([facility]) 07/22/2016  . Ventricular bigeminy 07/22/2016  . S/P CABG x 5 07/14/2016  . Unstable angina ([facility]) 07/10/2016  . Effort angina (HCC)   . Basal cell carcinoma in situ of skin of left shoulder 05/09/2016  . Insect bite 05/05/2016  . Non-healing skin lesion 04/27/2016  . Dyspnea 02/25/2016  . Counseling regarding end of life decision making 12/22/2014  . Atrial flutter ([facility]) 03/27/2014  . Nonspecific abnormal electrocardiogram (ECG) (EKG) 03/20/2014  . Neuropathy due to secondary diabetes mellitus ([facility]) 04/14/2010  . Gout 12/13/2009  . Diabetes mellitus without complication ([facility]) [id number]  . Hyperlipemia 03/29/2009  . 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 GLAUCOMA 03/29/2009  . Essential hypertension, benign 03/29/2009  . Coronary atherosclerosis 03/29/2009  . ALLERGIC RHINITIS 03/29/2009  . ASTHMA, CHILDHOOD 03/29/2009  . OSA (obstructive sleep apnea) 03/29/2009    Discharge Diagnoses:  Principal Problem:   S/P CABG x 5 Active Problems:   Unstable angina (HCC)   PAF (paroxysmal atrial fibrillation) (HCC)   Acute systolic congestive heart failure, NYHA class 4 (HCC)   Ventricular bigeminy   Discharged Condition: good  HPI:  70 year old diabetic male nonsmoker presents with class III equivalent angina-shortness of breath and decreasing exercise tolerance of fairly rapid progression over the past few weeks. A stress test was abnormal and cardiac catheterization performed today via right radial artery by Dr. Fletcher Anon. The patient has history of CAD and had PCI of the RCA in [place] 8 years ago. About 2 years ago he underwent ablation of  right atrial flutter by Dr. Caryl Comes. He has been off Coumadin for several months. No evidence recurrent flutter.. Stress test showed inferior lateral hypokinesia with ejection fraction of 35%. Echocardiogram is pending. Cardiac catheterization demonstrated three-vessel CAD with a diabetic pattern with 90 and 95% stenosis of the LAD, occlusion of the distal circumflex twice, and high-grade stenosis of the PD-PL branches of the distal RCA. LVEDP was 28. Ejection fraction 40-45 percent. Based on his coronary anatomy and symptoms he is felt to be candidate for surgical coronary revascularization. The patient denies resting symptoms. The rapid progression of his exertional symptoms are consistent with unstable angina.  Hospital Course:  On 07/14/2016 Mr. Feldhaus underwent a coronary bypass grafting 5 by Dr. Prescott Gum. He tolerated the procedure well and was transferred to ICU. Postop day 2 he remain sedated and unable to walk. We increased his Lantus for better blood glucose control. His narcotics were significantly reduced at this time. On postoperative day 3 he was much more alert and was able to walk 300 feet. His pain was well-controlled on his new pain medication regimen. He was hemodynamically stable off all inotropes. He remained fluid overloaded therefore a diuretic regimen was initiated. On postop day 4 he went into atrial fibrillation. He was started on the IV amiodarone protocol and converted to normal sinus rhythm. His blood pressure remained stable the entire time. Low-dose Lovenox was initiated. We continued to hold Coumadin at this time. He was transferred to the telemetry unit. On postop day 5 we converted his amiodarone to oral medication. We continued his epicardial wires at this time. He remained on 2 L nasal cannula, therefore he continued to work with  his incentive spirometry to try and wean oxygen requirements. His creatinine remains stable as we continued his diuretic regimen for fluid overload.  His blood glucose level remained well-controlled. However, preoperatively his A1c is >10.  This is not acceptable post cardiac surgery.  He will be taken off oral anti-glycemics and started on Lantus BID.  He was instructed to record his blood sugars to present to his PCP.  Goal is sugars less than 150 to ensure longevity of his bypass grafts. Cardiology was also following to assist with rhythm management and blood pressure control.  He continued to make good progress.  He is maintaining NSR with PVCs.  His pacing wires have been removed without difficulty.  He is ambulating independently and felt medically stable for discharge home today.  Consults: cardiology  Significant Diagnostic Studies:  CLINICAL DATA:  Shortness of breath, post CABG  EXAM: PORTABLE CHEST 1 VIEW  COMPARISON:  Portable chest x-ray of 07/17/2016  FINDINGS: There is little change in aeration with minimal left basilar atelectasis. A venous sheath remains in the SVC and no pneumothorax is noted. Heart size is stable. Median sternotomy sutures are noted.  IMPRESSION: 1. Little change in aeration with minimal left basilar atelectasis. 2. Venous sheath remains in the SVC.   Electronically Signed   By: Ivar Drape M.D.   On: 07/18/2016 08:28  Treatments: NAME:  SHAMERE, WILHELMI            ACCOUNT NO.:  [id number]  MEDICAL RECORD NO.:  [id number]  LOCATION:                                 FACILITY:  PHYSICIAN:  Ivin Poot, M.D.  DATE OF BIRTH:  Mar 21, 1946  DATE OF PROCEDURE: DATE OF DISCHARGE:                              OPERATIVE REPORT   OPERATION: 1. Coronary artery bypass grafting x5 (left internal mammary artery to     LAD, saphenous vein graft to diagonal, saphenous vein graft to     ramus intermediate, saphenous vein graft to posterior descending-     posterolateral branches of the distal RCA). 2. Endoscopic harvest of right leg greater saphenous vein.  SURGEON:  Ivin Poot,  M.D.  ASSISTANT:  Nicholes Rough, PA-C.  ANESTHESIA:  General.  PREOPERATIVE DIAGNOSIS:  Class 4 unstable angina with severe diabetic three-vessel coronary artery disease.  POSTOPERATIVE DIAGNOSIS:  Class 4 unstable angina with severe diabetic three-vessel coronary artery disease.   Discharge Exam: Blood pressure 120/77, pulse 81, temperature 97.9 F (36.6 C), temperature source Oral, resp. rate 18, height 5' 9.5" (1.765 m), weight 232 lb (105.2 kg), SpO2 100 %. General appearance: alert, cooperative and no distress Resp: clear to auscultation bilaterally Cardio: regular rate and rhythm GI: soft, non-tender; bowel sounds normal; no masses,  no organomegaly Extremities: extremities normal, atraumatic, no cyanosis or edema Pulses: 2+ and symmetric  Disposition: HOME  Discharge Instructions    Amb Referral to Cardiac Rehabilitation    Complete by:  As directed    Diagnosis:  CABG   CABG X ___:  5    Discharge medications:    Medication List    STOP taking these medications   BYSTOLIC 10 MG tablet Generic drug:  nebivolol   losartan 50 MG tablet Commonly known as:  COZAAR  TAKE these medications   amiodarone 200 MG tablet Commonly known as:  PACERONE Take 1 tablet (200 mg total) by mouth 2 (two) times daily.   aspirin 325 MG EC tablet Take 1 tablet (325 mg total) by mouth daily. Start taking on:  07/24/2016 What changed:  medication strength  how much to take   fenofibrate 160 MG tablet TAKE 1 TABLET BY MOUTH DAILY What changed:  See the new instructions.   Fish Oil 1000 MG Caps Take 2 mg by mouth 2 (two) times daily.   furosemide 40 MG tablet Commonly known as:  LASIX Take 1 tablet (40 mg total) by mouth daily. For 7 Days Start taking on:  07/24/2016   glucose blood test strip Commonly known as:  BAYER CONTOUR NEXT TEST Use to check blood sugar two times daily.  Dx: E11.9   insulin glargine 100 unit/mL Sopn Commonly known as:  LANTUS Inject  0.2 mLs (20 Units total) into the skin 2 (two) times daily at 8 am and 10 pm.   latanoprost 0.005 % ophthalmic solution Commonly known as:  XALATAN Place 1 drop into the left eye at bedtime.   metoprolol tartrate 25 MG tablet Commonly known as:  LOPRESSOR Take 1 tablet (25 mg total) by mouth 2 (two) times daily.   modafinil 200 MG tablet Commonly known as:  PROVIGIL Take 1 tablet (200 mg total) by mouth daily. Once daily What changed:  additional instructions   oxyCODONE 5 MG immediate release tablet Commonly known as:  Oxy IR/ROXICODONE Take 1 tablet (5 mg total) by mouth every 4 (four) hours as needed for severe pain.   PEN NEEDLES 29GX1/2" 29G X 12MM Misc 1 Units by Does not apply route 2 (two) times daily at 8 am and 10 pm. Please use clean needle for every insulin adminstration   rosuvastatin 10 MG tablet Commonly known as:  CRESTOR TAKE 1 TABLET BY MOUTH DAILY. What changed:  See the new instructions.   tamsulosin 0.4 MG Caps capsule Commonly known as:  FLOMAX TAKE 1 CAPSULE BY MOUTH DAILY What changed:  See the new instructions.   Vitamin D3 1000 units Caps Take 1,000 Units by mouth daily.         Follow-up Information    Eliezer Lofts, MD. Call today.   Specialty:  Family Medicine Contact information: [place] [address] [phone]        Tharon Aquas Kerby Less III, MD Follow up on 08/16/2016.   Specialty:  Cardiothoracic Surgery Why:  Appointment on 08/16/2016 at 12:30pm. Please report for a chest xray at [facility] at 12:00pm. They are located on the first floor of our building.  Contact information: [address] [place] [address] [phone]        Murray Hodgkins, NP Follow up on 08/07/2016.   Specialties:  Nurse Practitioner, Cardiology, Radiology Why:  Appointment is at 2:00 Contact information: [place] [address] [phone]          The patient has  been discharged on:   1.Beta Blocker:  Yes [  x ]                              No   [   ]  If No, reason:  2.Ace Inhibitor/ARB: Yes [   ]                                     No  [ x   ]                                     If No, reason: labile BP, elevated creatinine  3.Statin:   Yes [ x  ]                  No  [   ]                  If No, reason:  4.Ecasa:  Yes  [x ]                  No   [   ]                  If No, reason:     Signed: BARRETT, ERIN 07/23/2016, 9:42 AM

## 2016-07-19 NOTE — Discharge Instructions (Signed)
Coronary Artery Bypass Grafting, Care After °Refer to this sheet in the next few weeks. These instructions provide you with information on caring for yourself after your procedure. Your health care provider may also give you more specific instructions. Your treatment has been planned according to current medical practices, but problems sometimes occur. Call your health care provider if you have any problems or questions after your procedure. °WHAT TO EXPECT AFTER THE PROCEDURE °Recovery from surgery will be different for everyone. Some people feel well after 3 or 4 weeks, while for others it takes longer. After your procedure, it is typical to have the following: °· Nausea and a lack of appetite.   °· Constipation. °· Weakness and fatigue.   °· Depression or irritability.   °· Pain or discomfort at your incision site. °HOME CARE INSTRUCTIONS °· Take medicines only as directed by your health care provider. Do not stop taking medicines or start any new medicines without first checking with your health care provider. °· Take your pulse as directed by your health care provider. °· Perform deep breathing as directed by your health care provider. If you were given a device called an incentive spirometer, use it to practice deep breathing several times a day. Support your chest with a pillow or your arms when you take deep breaths or cough. °· Keep incision areas clean, dry, and protected. Remove or change any bandages (dressings) only as directed by your health care provider. You may have skin adhesive strips over the incision areas. Do not take the strips off. They will fall off on their own. °· Check incision areas daily for any swelling, redness, or drainage. °· If incisions were made in your legs, do the following: °¨ Avoid crossing your legs.   °¨ Avoid sitting for long periods of time. Change positions every 30 minutes.   °¨ Elevate your legs when you are sitting. °· Wear compression stockings as directed by your  health care provider. These stockings help keep blood clots from forming in your legs. °· Take showers once your health care provider approves. Until then, only take sponge baths. Pat incisions dry. Do not rub incisions with a washcloth or towel. Do not take baths, swim, or use a hot tub until your health care provider approves. °· Eat foods that are high in fiber, such as raw fruits and vegetables, whole grains, beans, and nuts. Meats should be lean cut. Avoid canned, processed, and fried foods. °· Drink enough fluid to keep your urine clear or pale yellow. °· Weigh yourself every day. This helps identify if you are retaining fluid that may make your heart and lungs work harder. °· Rest and limit activity as directed by your health care provider. You may be instructed to: °¨ Stop any activity at once if you have chest pain, shortness of breath, irregular heartbeats, or dizziness. Get help right away if you have any of these symptoms. °¨ Move around frequently for short periods or take short walks as directed by your health care provider. Increase your activities gradually. You may need physical therapy or cardiac rehabilitation to help strengthen your muscles and build your endurance. °¨ Avoid lifting, pushing, or pulling anything heavier than 10 lb (4.5 kg) for at least 6 weeks after surgery. °· Do not drive until your health care provider approves.  °· Ask your health care provider when you may return to work. °· Ask your health care provider when you may resume sexual activity. °· Keep all follow-up visits as directed by your health care   provider. This is important. °SEEK MEDICAL CARE IF: °· You have swelling, redness, increasing pain, or drainage at the site of an incision. °· You have a fever. °· You have swelling in your ankles or legs. °· You have pain in your legs.   °· You gain 2 or more pounds (0.9 kg) a day. °· You are nauseous or vomit. °· You have diarrhea.  °SEEK IMMEDIATE MEDICAL CARE IF: °· You have  chest pain that goes to your jaw or arms. °· You have shortness of breath.   °· You have a fast or irregular heartbeat.   °· You notice a "clicking" in your breastbone (sternum) when you move.   °· You have numbness or weakness in your arms or legs. °· You feel dizzy or light-headed.   °MAKE SURE YOU: °· Understand these instructions. °· Will watch your condition. °· Will get help right away if you are not doing well or get worse. °  °This information is not intended to replace advice given to you by your health care provider. Make sure you discuss any questions you have with your health care provider. °  °Document Released: 04/07/2005 Document Revised: 10/09/2014 Document Reviewed: 02/25/2013 °Elsevier Interactive Patient Education ©2016 [employer] ° °

## 2016-07-19 NOTE — Progress Notes (Addendum)
      [address]       [place] [address]             [phone]      5 Days Post-Op Procedure(s) (LRB): CORONARY ARTERY BYPASS GRAFTING (CABG), ON PUMP, TIMES FIVE, USING LEFT INTERNAL MAMMARY ARTERY, RIGHT GREATER SAPHENOUS VEIN HARVESTED ENDOSCOPICALLY (N/A) TRANSESOPHAGEAL ECHOCARDIOGRAM (TEE) (N/A) Subjective: Shares that he slept poorly. His pain has been controlled with the medication.   Objective: Vital signs in last 24 hours: Temp:  [97.5 F (36.4 C)-98.6 F (37 C)] 97.7 F (36.5 C) (10/18 0834) Pulse Rate:  [59-91] 86 (10/18 0834) Cardiac Rhythm: Bundle branch block;Heart block (10/18 0700) Resp:  [18-20] 20 (10/18 0834) BP: (91-131)/(68-87) 131/80 (10/18 0834) SpO2:  [92 %-100 %] 98 % (10/18 0834) Weight:  [238 lb 6.4 oz (108.1 kg)] 238 lb 6.4 oz (108.1 kg) (10/18 0357)    Intake/Output from previous day: 10/17 0701 - 10/18 0700 In: 231.8 [P.O.:120; I.V.:111.8] Out: 1435 [Urine:1435] Intake/Output this shift: Total I/O In: 120 [P.O.:120] Out: 50 [Urine:50]  General appearance: alert, cooperative and no distress Heart: regular rate and rhythm Lungs: clear to auscultation bilaterally Abdomen: soft, non-tender; bowel sounds normal; no masses,  no organomegaly Extremities: edema 1+ pitting pedal edema Wound: c/d/i with some serous drainage near the distal 1/3 of the incision  Lab Results:  Recent Labs  07/17/16 0409 07/18/16 0425  WBC 13.6* 10.4  HGB 10.4* 10.5*  HCT 32.3* 31.8*  PLT 117* 125*   BMET:  Recent Labs  07/17/16 0409 07/18/16 0425  NA 132* 132*  K 4.8 3.7  CL 97* 95*  CO2 28 29  GLUCOSE 154* 125*  BUN 29* 23*  CREATININE 1.14 1.02  CALCIUM 8.2* 8.2*    PT/INR: No results for input(s): LABPROT, INR in the last 72 hours. ABG    Component Value Date/Time   PHART 7.346 (L) 07/16/2016 1900   HCO3 25.4 07/16/2016 1900   TCO2 27 07/16/2016 1900   ACIDBASEDEF 1.0 07/16/2016 1900   O2SAT 94.0 07/16/2016 1900    CBG (last 3)   Recent Labs  07/18/16 2351 07/19/16 0355 07/19/16 0832  GLUCAP 145* 120* 106*    Assessment/Plan: S/P Procedure(s) (LRB): CORONARY ARTERY BYPASS GRAFTING (CABG), ON PUMP, TIMES FIVE, USING LEFT INTERNAL MAMMARY ARTERY, RIGHT GREATER SAPHENOUS VEIN HARVESTED ENDOSCOPICALLY (N/A) TRANSESOPHAGEAL ECHOCARDIOGRAM (TEE) (N/A)  1. CV- atrial fibrillation yesterday and converted to NSR with Amio protocol. Will add PO Amio and discontinue drip. BP has been well controlled. Tolerating Bb. Continue EPW.  2. Pulm-remains on 2L [place]. Minimal atelectasis on last CXR. Wean as tolerated 3. Renal-creatinine stable at 1.02. Continue diuretic regimen for fluid overload. Weight is trending down with a negative fluid balance. Hypokalemia-will replace 4. Endocrine- blood glucose well controlled  5. Acute blood loss anemia-H & H stable 6. Anticoagulation- On Lovenox. Holding Coumadin for now.   Plan: Wean oxygen as tolerated. Increase dietary intake. Continue diuretic regimen for fluid overload. Change to PO Amio. Continue to monitor rhythm closely. Encourage IS hourly. Ambulate TID.     LOS: 9 days    Elgie Collard 07/19/2016

## 2016-07-19 NOTE — Progress Notes (Signed)
CARDIAC REHAB PHASE I   PRE:  Rate/Rhythm: 82 bigeminy?, VT?     BP: sitting 113/79  Came to ambulate however pt has had change in rhythm and is feeling SOB. Looks pale in bed. BP stable. Discussed with RN who apparently just got EKG and paged PA. Will check back later as time allows. Pt also has PT today. [place], ACSM 07/19/2016 10:55 AM

## 2016-07-19 NOTE — Consult Note (Signed)
   [facility] CM Inpatient Consult   07/19/2016  TERRIAL MIZER 12-29-1945 [id number]   Link to [facility] Care Management follow up visit. Went to bedside earlier. However, Mr. Cerino was sleeping soundly.Spoke his wife to make aware that Link to [place] will follow up post hospital discharge.   Mrs. Reitan also states she thinks their insurance changes to [employer] at the end of the month. She states she is grateful that Mr. Kaluza will still be eligible for [facility] Care Management services. Mrs. Fenoglio states they have still have packet and contact information for [facility] transition.   Marthenia Rolling, MSN-Ed, RN,BSN [facility] Liaison [phone]    Will continue to follow.

## 2016-07-19 NOTE — Progress Notes (Signed)
Patient Name: Tommy Sullivan Date of Encounter: 07/19/2016  Primary Cardiologist: Dr. Antionette Char Problem List     Active Problems:   Unstable angina (HCC)   S/P CABG x 5     Subjective   Felt SOB this AM. After urinating feels better. No CP.   Inpatient Medications    Scheduled Meds: . acetaminophen  1,000 mg Oral Q6H  . amiodarone  400 mg Oral BID  . aspirin EC  325 mg Oral Daily  . bisacodyl  10 mg Oral Daily   Or  . bisacodyl  10 mg Rectal Daily  . budesonide (PULMICORT) nebulizer solution  0.5 mg Nebulization BID  . docusate sodium  200 mg Oral Daily  . enoxaparin (LOVENOX) injection  40 mg Subcutaneous Q24H  . furosemide  40 mg Intravenous Daily  . insulin aspart  0-24 Units Subcutaneous Q4H  . insulin detemir  20 Units Subcutaneous BID  . latanoprost  1 drop Left Eye QHS  . mouth rinse  15 mL Mouth Rinse BID  . metoCLOPramide (REGLAN) injection  10 mg Intravenous Q6H  . metoprolol tartrate  12.5 mg Oral BID  . pantoprazole  40 mg Oral Daily  . potassium chloride  30 mEq Oral Daily  . rosuvastatin  10 mg Oral QHS  . sodium chloride flush  3 mL Intravenous Q12H   Continuous Infusions: . sodium chloride Stopped (07/16/16 2000)  . sodium chloride 250 mL (07/18/16 1200)  . sodium chloride Stopped (07/17/16 0700)  . amiodarone 30 mg/hr (07/18/16 2349)  . lactated ringers Stopped (07/16/16 2000)  . lactated ringers Stopped (07/16/16 2000)   PRN Meds: sodium chloride, albumin human, levalbuterol, metoprolol, ondansetron (ZOFRAN) IV, oxyCODONE, sodium chloride flush, traMADol   Vital Signs    Vitals:   07/19/16 0834 07/19/16 0937 07/19/16 0956 07/19/16 1006  BP: 131/80 112/78    Pulse: 86   78  Resp: 20   20  Temp: 97.7 F (36.5 C)     TempSrc: Oral     SpO2: 98%  98% 99%  Weight:      Height:        Intake/Output Summary (Last 24 hours) at 07/19/16 1114 Last data filed at 07/19/16 1000  Gross per 24 hour  Intake            286.7 ml    Output             1175 ml  Net           -888.3 ml   Filed Weights   07/17/16 0500 07/18/16 0500 07/19/16 0357  Weight: 244 lb 11.4 oz (111 kg) 240 lb 1.3 oz (108.9 kg) 238 lb 6.4 oz (108.1 kg)    Physical Exam    GEN: Well nourished, well developed, in no acute distress.  HEENT: Grossly normal.  Neck: Supple, no JVD, carotid bruits, or masses. Cardiac: irreg irreg, no murmurs, rubs, or gallops. No clubbing, cyanosis, edema.  Radials/DP/PT 2+ and equal bilaterally.  Respiratory: Mild wheeze BLL.  GI: Soft, nontender, nondistended, BS + x 4. MS: no deformity or atrophy. Skin: pale. Neuro:  Strength and sensation are intact. Psych: AAOx3.  Normal affect.  Labs    CBC  Recent Labs  07/17/16 0409 07/18/16 0425  WBC 13.6* 10.4  HGB 10.4* 10.5*  HCT 32.3* 31.8*  MCV 91.2 89.8  PLT 117* 0000000*   Basic Metabolic Panel  Recent Labs  07/17/16 0409 07/18/16 0425  NA 132* 132*  K 4.8 3.7  CL 97* 95*  CO2 28 29  GLUCOSE 154* 125*  BUN 29* 23*  CREATININE 1.14 1.02  CALCIUM 8.2* 8.2*     Telemetry    V bigem, however I wonder if there is an underlying atrial flutter (2 p waves appear to be present at times within R-R interval - Personally Reviewed  ECG    Sinus with frequent PVC's, bigem pattern.  - Personally Reviewed  Radiology    Dg Chest Port 1 View  Result Date: 07/18/2016 CLINICAL DATA:  Shortness of breath, post CABG EXAM: PORTABLE CHEST 1 VIEW COMPARISON:  Portable chest x-ray of 07/17/2016 FINDINGS: There is little change in aeration with minimal left basilar atelectasis. A venous sheath remains in the SVC and no pneumothorax is noted. Heart size is stable. Median sternotomy sutures are noted. IMPRESSION: 1. Little change in aeration with minimal left basilar atelectasis. 2. Venous sheath remains in the SVC. Electronically Signed   By: Ivar Drape M.D.   On: 07/18/2016 08:28    Cardiac Studies   ECHO: 07/12/16 - Left ventricle: The cavity size was  normal. Wall thickness was   normal. Systolic function was mildly reduced. The estimated   ejection fraction was in the range of 45% to 50%. There is   akinesis of the basalinferior myocardium. - Aortic valve: Trileaflet; mildly thickened, mildly calcified   leaflets. - Left atrium: The atrium was moderately dilated. - Tricuspid valve: There was mild regurgitation.  Impressions:  - EF is mildly improved when compared to prior study.  Patient Profile     70 year old with CABG, severe CAD, unstable angina, ischemic cardiomyopathy.   Assessment & Plan    Unstable angina: Cardiac catheterization showed severe three-vessel coronary artery disease with reduced LV systolic function [id number].   CABG - Dr. Prescott Gum   Post op AFIB   - metop low dose  - rate controlled  - PO AMIO currently  - ECG from 07/18/16 at 0043 shows AFIB  - Now on tele ? Possible underlying atrial flutter. May be sinus with first degree AVB with PVC bigem.   Acute on chronic systolic heart failure: His LVEDP was moderately elevated.     - Bb, add ACE-I when able. BP soft.  off milrinone.   - lasix 40 IV QD  - lungs - wheeze - possible continued pulm edema. IV lasix given.   Status post atrial flutter ablation:  see above   Hyperlipidemia: Continue treatment with rosuvastatin.  Type 2 diabetes:   Hold metformin. Sliding scale coverage.  Sleep apnea:    On CPAP.   Signed, Candee Furbish, MD  07/19/2016, 11:14 AM

## 2016-07-19 NOTE — Progress Notes (Signed)
PT Cancellation Note  Patient Details Name: MILA BIRNER MRN: [id number] DOB: 1946/07/10   Cancelled Treatment:    Reason Eval/Treat Not Completed: Medical issues which prohibited therapy; patient with near syncopal episode with cardiac rehab earlier per RN, and now back on pacer.  Will cancel this pm and check back tomorrow.   Reginia Naas 07/19/2016, 4:54 PM  Magda Kiel, [place] 07/19/2016

## 2016-07-20 ENCOUNTER — Ambulatory Visit: Payer: 59 | Admitting: Family Medicine

## 2016-07-20 LAB — GLUCOSE, CAPILLARY
GLUCOSE-CAPILLARY: 107 mg/dL — AB (ref 65–99)
GLUCOSE-CAPILLARY: 114 mg/dL — AB (ref 65–99)
GLUCOSE-CAPILLARY: 165 mg/dL — AB (ref 65–99)
Glucose-Capillary: 66 mg/dL (ref 65–99)
Glucose-Capillary: 72 mg/dL (ref 65–99)
Glucose-Capillary: 87 mg/dL (ref 65–99)

## 2016-07-20 MED ORDER — SORBITOL 70 % SOLN
45.0000 mL | Freq: Once | Status: AC
  Administered 2016-07-20: 45 mL via ORAL
  Filled 2016-07-20: qty 60

## 2016-07-20 NOTE — Progress Notes (Addendum)
      [address]       [place] [address]             [phone]      6 Days Post-Op Procedure(s) (LRB): CORONARY ARTERY BYPASS GRAFTING (CABG), ON PUMP, TIMES FIVE, USING LEFT INTERNAL MAMMARY ARTERY, RIGHT GREATER SAPHENOUS VEIN HARVESTED ENDOSCOPICALLY (N/A) TRANSESOPHAGEAL ECHOCARDIOGRAM (TEE) (N/A) Subjective: He is resting peacefully this morning. Shares that he is feeling much better.   Objective: Vital signs in last 24 hours: Temp:  [97.5 F (36.4 C)-97.7 F (36.5 C)] 97.5 F (36.4 C) (10/18 1934) Pulse Rate:  [72-94] 72 (10/19 0411) Cardiac Rhythm: A-V Sequential paced (10/18 1940) Resp:  [20-21] 20 (10/18 1934) BP: (64-131)/(42-96) 122/92 (10/18 1934) SpO2:  [94 %-100 %] 95 % (10/18 2214) Weight:  [236 lb 6.4 oz (107.2 kg)] 236 lb 6.4 oz (107.2 kg) (10/19 0422)     Intake/Output from previous day: 10/18 0701 - 10/19 0700 In: 666.8 [P.O.:480; I.V.:186.8] Out: 1025 [Urine:1025] Intake/Output this shift: No intake/output data recorded.  General appearance: alert, cooperative and no distress Heart: regular rate and rhythm Lungs: clear to auscultation bilaterally Abdomen: soft, non-tender; bowel sounds normal; no masses,  no organomegaly Extremities: edema 1+ pitting pedal edema Wound: c/d/i with some clear drainage near distal incision  Lab Results:  Recent Labs  07/18/16 0425 07/19/16 1244  WBC 10.4 10.6*  HGB 10.5* 10.6*  HCT 31.8* 32.2*  PLT 125* 165   BMET:  Recent Labs  07/18/16 0425 07/19/16 1244  NA 132* 131*  K 3.7 4.0  CL 95* 91*  CO2 29 31  GLUCOSE 125* 114*  BUN 23* 36*  CREATININE 1.02 1.37*  CALCIUM 8.2* 8.2*    PT/INR: No results for input(s): LABPROT, INR in the last 72 hours. ABG    Component Value Date/Time   PHART 7.346 (L) 07/16/2016 1900   HCO3 25.4 07/16/2016 1900   TCO2 27 07/16/2016 1900   ACIDBASEDEF 1.0 07/16/2016 1900   O2SAT 94.0 07/16/2016 1900   CBG (last 3)   Recent Labs   07/20/16 0005 07/20/16 0405 07/20/16 0501  GLUCAP 87 66 72    Assessment/Plan: S/P Procedure(s) (LRB): CORONARY ARTERY BYPASS GRAFTING (CABG), ON PUMP, TIMES FIVE, USING LEFT INTERNAL MAMMARY ARTERY, RIGHT GREATER SAPHENOUS VEIN HARVESTED ENDOSCOPICALLY (N/A) TRANSESOPHAGEAL ECHOCARDIOGRAM (TEE) (N/A)  1. CV- A-V pacing at 90bpm. Underlying is 82bpm NSR h/o atrial fibrillation and converted to NSR with Amio protocol. On PO Amio. BP has been well controlled. Tolerating Bb. Continue EPW. Feels better today after feeling poorly yesterday afternoon. Will continue to pace. Has not ambulated yet today. 2. Pulm-remains on 2L [place]. Minimal atelectasis on last CXR. No pneumothorax. No pleural effusion.  Wean as tolerated 3. Renal-creatinine stable at 1.37. Continue diuretic regimen for fluid overload. Weight is trending down with a negative fluid balance. Will order a BMP tomorrow.  4. Endocrine- blood glucose well controlled  5. Acute blood loss anemia-H & H stable 6. Anticoagulation- On Lovenox. Holding Coumadin for now.   Plan: Continue pacing. BP well controlled. On Amio and Bb. Electrolytes ok. Cardiology following for assistance. Wean oxygen as tolerated. Repeat CXR from yesterday afternoon reviewed. Creatinine bumped, therefore trend with another BMP tomorrow morning. Continue ambulation. Encourage incentive spirometry for improving atelectasis.    LOS: 10 days    Elgie Collard 07/20/2016  Stop lasix Check echo patient examined and medical record reviewed,agree with above note. Tharon Aquas Trigt III 07/20/2016

## 2016-07-20 NOTE — Progress Notes (Signed)
Physical Therapy Treatment Patient Details Name: Tommy Sullivan MRN: [id number] DOB: September 26, 1946 Today's Date: 07/20/2016    History of Present Illness 71 yo admitted with CAD and s/p CABGx5. PMHx: CAD, ICM, glaucoma, blind right eye, HTN, HTD, DM, gout, heart failure    PT Comments    Patient tolerated gait well and with increase in distance. Ambulated on 4L O2 and SpO2 remained 95-96%. Wife present. Reviewed precautions. Continue to progress as tolerated with anticipated d/c home with HHPT.   Follow Up Recommendations  Home health PT     Equipment Recommendations  Rolling walker with 5" wheels;3in1 (PT)    Recommendations for Other Services       Precautions / Restrictions Precautions Precautions: Sternal;Fall Precaution Comments: reviewed precautions with pt and wife    Mobility  Bed Mobility Overal bed mobility: Needs Assistance Bed Mobility: Rolling;Sidelying to Sit Rolling: Min guard Sidelying to sit: Min assist       General bed mobility comments: cues for sequencing, precautions, and use of cardiac pillow  Transfers Overall transfer level: Needs assistance Equipment used: None Transfers: Sit to/from Stand Sit to Stand: Min guard         General transfer comment: cues for hand placement; min guard for safety  Ambulation/Gait Ambulation/Gait assistance: Min guard Ambulation Distance (Feet): 350 Feet Assistive device: Rolling walker (2 wheeled) Gait Pattern/deviations: Step-through pattern;Decreased stride length     General Gait Details: slow, steady gait; cues initially for posture   Stairs            Wheelchair Mobility    Modified Rankin (Stroke Patients Only)       Balance     Sitting balance-Leahy Scale: Fair       Standing balance-Leahy Scale: Fair                      Cognition Arousal/Alertness: Awake/alert Behavior During Therapy: WFL for tasks assessed/performed Overall Cognitive Status: Within  Functional Limits for tasks assessed                      Exercises      General Comments General comments (skin integrity, edema, etc.): pt on 4L O2 with mobility and SpO2 96%       Pertinent Vitals/Pain Pain Assessment: No/denies pain    Home Living                      Prior Function            PT Goals (current goals can now be found in the care plan section) Acute Rehab PT Goals Patient Stated Goal: none stated Progress towards PT goals: Progressing toward goals    Frequency    Min 3X/week      PT Plan Current plan remains appropriate    Co-evaluation             End of Session Equipment Utilized During Treatment: Gait belt;Oxygen Activity Tolerance: Patient tolerated treatment well Patient left: with call bell/phone within reach;with family/visitor present     Time: [id number] PT Time Calculation (min) (ACUTE ONLY): 33 min  Charges:  $Gait Training: 8-22 mins $Therapeutic Activity: 8-22 mins                    G Codes:      Salina April, PTA Pager: [phone]   07/20/2016, 9:49 AM

## 2016-07-20 NOTE — Progress Notes (Signed)
Patient Name: Tommy Sullivan Date of Encounter: 07/20/2016  Primary Cardiologist: Dr. Antionette Char Problem List     Active Problems:   Unstable angina (HCC)   S/P CABG x 5     Subjective   Improved today. Still having his ups and downs he states.  Felt SOB on 10/18. Vent bigem noted. A/V paced currently.   Inpatient Medications    Scheduled Meds: . amiodarone  400 mg Oral BID  . aspirin EC  325 mg Oral Daily  . bisacodyl  10 mg Oral Daily   Or  . bisacodyl  10 mg Rectal Daily  . budesonide (PULMICORT) nebulizer solution  0.5 mg Nebulization BID  . docusate sodium  200 mg Oral Daily  . enoxaparin (LOVENOX) injection  40 mg Subcutaneous Q24H  . furosemide  40 mg Intravenous Daily  . guaiFENesin  1,200 mg Oral BID  . insulin aspart  0-24 Units Subcutaneous Q4H  . insulin detemir  20 Units Subcutaneous BID  . latanoprost  1 drop Left Eye QHS  . mouth rinse  15 mL Mouth Rinse BID  . metoCLOPramide (REGLAN) injection  10 mg Intravenous Q6H  . metoprolol tartrate  12.5 mg Oral BID  . pantoprazole  40 mg Oral Daily  . potassium chloride  30 mEq Oral Daily  . rosuvastatin  10 mg Oral QHS  . sodium chloride flush  3 mL Intravenous Q12H   Continuous Infusions: . sodium chloride Stopped (07/16/16 2000)  . sodium chloride 250 mL (07/18/16 1200)  . sodium chloride Stopped (07/17/16 0700)  . amiodarone 30 mg/hr (07/18/16 2349)  . lactated ringers Stopped (07/16/16 2000)  . lactated ringers Stopped (07/16/16 2000)   PRN Meds: sodium chloride, albumin human, levalbuterol, metoprolol, ondansetron (ZOFRAN) IV, oxyCODONE, sodium chloride flush, traMADol   Vital Signs    Vitals:   07/19/16 1934 07/19/16 2214 07/20/16 0411 07/20/16 0422  BP: (!) 122/92     Pulse: 90  72   Resp: 20     Temp: 97.5 F (36.4 C)     TempSrc: Oral     SpO2: 99% 95%    Weight:    236 lb 6.4 oz (107.2 kg)  Height:        Intake/Output Summary (Last 24 hours) at 07/20/16 0851 Last data  filed at 07/20/16 0420  Gross per 24 hour  Intake           546.76 ml  Output              975 ml  Net          -428.24 ml   Filed Weights   07/18/16 0500 07/19/16 0357 07/20/16 0422  Weight: 240 lb 1.3 oz (108.9 kg) 238 lb 6.4 oz (108.1 kg) 236 lb 6.4 oz (107.2 kg)    Physical Exam    GEN: Well nourished, well developed, in no acute distress.  HEENT: Grossly normal.  Neck: Supple, no JVD, carotid bruits, or masses. Cardiac: irreg irreg, no murmurs, rubs, or gallops. No clubbing, cyanosis, edema.  Radials/DP/PT 2+ and equal bilaterally.  Respiratory: Mild wheeze BLL improved.  GI: Soft, nontender, nondistended, BS + x 4. MS: no deformity or atrophy. Skin: pale. Neuro:  Strength and sensation are intact. Psych: AAOx3.  Normal affect.  Labs    CBC  Recent Labs  07/18/16 0425 07/19/16 1244  WBC 10.4 10.6*  HGB 10.5* 10.6*  HCT 31.8* 32.2*  MCV 89.8 88.5  PLT 125* 165  Basic Metabolic Panel  Recent Labs  07/18/16 0425 07/19/16 1244  NA 132* 131*  K 3.7 4.0  CL 95* 91*  CO2 29 31  GLUCOSE 125* 114*  BUN 23* 36*  CREATININE 1.02 1.37*  CALCIUM 8.2* 8.2*  MG  --  2.2     Telemetry    A V paced (not flutter)- Personally Reviewed  ECG    Sinus with frequent PVC's, bigem pattern.  - Personally Reviewed  Radiology    Dg Chest Port 1 View  Result Date: 07/19/2016 CLINICAL DATA:  Shortness of breath. EXAM: PORTABLE CHEST 1 VIEW COMPARISON:  07/18/2016 FINDINGS: Sequelae of prior CABG are again identified. Right jugular sheath has been removed. The cardiac silhouette remains mildly enlarged. Minimal retrocardiac opacity in the left lung base is unchanged, likely atelectasis. There is no evidence of pulmonary edema, sizable pleural effusion, or pneumothorax. IMPRESSION: Minimal left basilar atelectasis, unchanged. Electronically Signed   By: Logan Bores M.D.   On: 07/19/2016 17:11    Cardiac Studies   ECHO: 07/12/16 - Left ventricle: The cavity size was  normal. Wall thickness was   normal. Systolic function was mildly reduced. The estimated   ejection fraction was in the range of 45% to 50%. There is   akinesis of the basalinferior myocardium. - Aortic valve: Trileaflet; mildly thickened, mildly calcified   leaflets. - Left atrium: The atrium was moderately dilated. - Tricuspid valve: There was mild regurgitation.  Impressions:  - EF is mildly improved when compared to prior study.  Patient Profile     70 year old with CABG, severe CAD, unstable angina, ischemic cardiomyopathy.   Assessment & Plan    Unstable angina: Cardiac catheterization showed severe three-vessel coronary artery disease with reduced LV systolic function [id number].   CABG - Dr. Prescott Gum   Post op AFIB   - metop low dose  - rate controlled  - PO AMIO currently  - ECG from 07/18/16 at 0043 shows AFIB  - A V paced (no flutter).   Acute on chronic systolic heart failure: His LVEDP was moderately elevated.     - Bb. BP soft.  off milrinone.   - lasix 40 IV QD - may wish to hold given his bump in creat.  No ACE-I because of increase in creat.    Status post atrial flutter ablation:  see above   Hyperlipidemia: Continue treatment with rosuvastatin.  Type 2 diabetes:   Hold metformin. Sliding scale coverage.  Sleep apnea:    On CPAP.   Signed, Candee Furbish, MD  07/20/2016, 8:51 AM

## 2016-07-20 NOTE — Progress Notes (Signed)
CARDIAC REHAB PHASE I   PRE:  Rate/Rhythm: 90 pacing    BP: sitting 102/70    SaO2: 94 2 1/2 L  MODE:  Ambulation: 350 ft   POST:  Rate/Rhythm: 90 pacing    BP: sitting 123/83     SaO2: 99 2L (slow to register)  Pt reluctant to walk. Ambulated on 2L with RW, assist x1. Several rest stops due to SOB. Tired after walk. Generally improved today. Encouragedx1 more walk and more IS (hasn't done today). [place], ACSM 07/20/2016 2:06 PM

## 2016-07-21 ENCOUNTER — Encounter (HOSPITAL_COMMUNITY)
Admission: AD | Disposition: A | Discharge status: Home / Self Care | Payer: Self-pay | Source: Other Acute Inpatient Hospital | Attending: Cardiothoracic Surgery

## 2016-07-21 ENCOUNTER — Inpatient Hospital Stay (HOSPITAL_COMMUNITY): Payer: 59

## 2016-07-21 ENCOUNTER — Other Ambulatory Visit (HOSPITAL_COMMUNITY): Payer: 59

## 2016-07-21 ENCOUNTER — Encounter (HOSPITAL_COMMUNITY): Payer: 59

## 2016-07-21 LAB — GLUCOSE, CAPILLARY
GLUCOSE-CAPILLARY: 112 mg/dL — AB (ref 65–99)
GLUCOSE-CAPILLARY: 88 mg/dL (ref 65–99)
Glucose-Capillary: 148 mg/dL — ABNORMAL HIGH (ref 65–99)
Glucose-Capillary: 120 mg/dL — ABNORMAL HIGH (ref 65–99)
Glucose-Capillary: 114 mg/dL — ABNORMAL HIGH (ref 65–99)

## 2016-07-21 LAB — BASIC METABOLIC PANEL
ANION GAP: 8 (ref 5–15)
BUN: 38 mg/dL — ABNORMAL HIGH (ref 6–20)
CALCIUM: 8.6 mg/dL — AB (ref 8.9–10.3)
CO2: 33 mmol/L — AB (ref 22–32)
CREATININE: 1.21 mg/dL (ref 0.61–1.24)
Chloride: 90 mmol/L — ABNORMAL LOW (ref 101–111)
GFR calc Af Amer: >60 mL/min (ref >60)
GFR calc non Af Amer: 59 mL/min — ABNORMAL LOW (ref >60)
GLUCOSE: 89 mg/dL (ref 65–99)
Potassium: 3.7 mmol/L (ref 3.5–5.1)
Sodium: 131 mmol/L — ABNORMAL LOW (ref 135–145)

## 2016-07-21 SURGERY — BRONCHOSCOPY, WITH FLUOROSCOPY
Anesthesia: Moderate Sedation | Laterality: Bilateral

## 2016-07-21 MED ORDER — POTASSIUM CHLORIDE CRYS ER 20 MEQ PO TBCR
30.0000 meq | EXTENDED_RELEASE_TABLET | Freq: Once | ORAL | Status: AC
  Administered 2016-07-21: 30 meq via ORAL
  Filled 2016-07-21: qty 1

## 2016-07-21 MED ORDER — INSULIN ASPART 100 UNIT/ML ~~LOC~~ SOLN
0.0000 [IU] | SUBCUTANEOUS | Status: DC
  Administered 2016-07-21: 2 [IU] via SUBCUTANEOUS
  Administered 2016-07-22: 4 [IU] via SUBCUTANEOUS
  Administered 2016-07-22: 2 [IU] via SUBCUTANEOUS

## 2016-07-21 MED ORDER — FUROSEMIDE 40 MG PO TABS: 40.0000 mg | ORAL_TABLET | Freq: Every day | ORAL | Status: DC

## 2016-07-21 NOTE — Progress Notes (Signed)
CARDIAC REHAB PHASE I   PRE:  Rate/Rhythm: 80  BP:  Sitting: 113/63        SaO2: 98 RA  MODE:  Ambulation: 350 ft   POST:  Rate/Rhythm: 84   BP:  Sitting: 114/81         SaO2: 97 RA  Pt states he has not slept, agreeable to walk. Pt ambulated 350 ft on RA, rolling walker, assist x1, mostly steady gait, tolerated failry well. Pt c/o fatigue, DOE, declined rest stop. Pt somewhat anxious, very concerned about his shortness of breath, states he did have a panic attack earlier, may be related to anxiety. VSS. Pt to bed per pt request after walk, call bell within reach. Encouraged IS, additional ambulation x1 today. Will follow.   Maurice, RN, BSN 07/21/2016 2:06 PM

## 2016-07-21 NOTE — Consult Note (Signed)
   [facility] CM Inpatient Consult   07/21/2016  Tommy Sullivan [birth year] [id number]     Spoke with inpatient prior to bedside visit. Spoke with Tommy Sullivan and wife at bedside. He currently external pacer. States "it has been slow going". Made him aware that he will still receive post hospital discharge telephone call follow up. Appreciative of visit. Inpatient RNCM aware that Tommy Sullivan has been active with [facility] program for DM management. Will continue to follow.   Marthenia Rolling, MSN-Ed, RN,BSN [facility] ([facility] Liaison [phone]

## 2016-07-21 NOTE — Progress Notes (Signed)
Physical Therapy Treatment Patient Details Name: Tommy Sullivan MRN: [id number] DOB: 1946/04/11 Today's Date: 07/21/2016    History of Present Illness 70 yo admitted with CAD and s/p CABGx5. PMHx: CAD, ICM, glaucoma, blind right eye, HTN, HTD, DM, gout, heart failure    PT Comments    Pt was assisted with gait in a controlled paced way, reminded about the technique for pillow to chest to stand and control sitting.  Pt is motivated and follows demonstration with PT.  Wife present to observe and is attentive.  Continue acutely as he is still strengthening LE's for standing control without use of arms.  Follow Up Recommendations  Home health PT     Equipment Recommendations  Rolling walker with 5" wheels;3in1 (PT)    Recommendations for Other Services       Precautions / Restrictions Precautions Precautions: Sternal;Fall Precaution Comments: reviewed management of sternal precautions with transfers  Restrictions Weight Bearing Restrictions: Yes Other Position/Activity Restrictions: sternal precautions    Mobility  Bed Mobility               General bed mobility comments: up in chair  Transfers Overall transfer level: Needs assistance Equipment used:  (sternal pillow) Transfers: Sit to/from Stand;Stand Pivot Transfers Sit to Stand: Min guard;Min assist Stand pivot transfers: Min guard       General transfer comment: used repetitive rocks   Ambulation/Gait Ambulation/Gait assistance: Physicist, medical (Feet): 350 Feet Assistive device: Rolling walker (2 wheeled) Gait Pattern/deviations: Step-through pattern;Narrow base of support;Trunk flexed;Decreased stride length Gait velocity: reduced Gait velocity interpretation: Below normal speed for age/gender General Gait Details: speed very controlled and PT holding pacemaker   Stairs            Wheelchair Mobility    Modified Rankin (Stroke Patients Only)       Balance     Sitting  balance-Leahy Scale: Good       Standing balance-Leahy Scale: Fair                      Cognition Arousal/Alertness: Awake/alert Behavior During Therapy: WFL for tasks assessed/performed Overall Cognitive Status: Within Functional Limits for tasks assessed                      Exercises      General Comments General comments (skin integrity, edema, etc.): Pt off O2 today and sats were 93% consistently      Pertinent Vitals/Pain Pain Assessment: No/denies pain    Home Living                      Prior Function            PT Goals (current goals can now be found in the care plan section) Acute Rehab PT Goals Patient Stated Goal: none stated Progress towards PT goals: Progressing toward goals    Frequency    Min 3X/week      PT Plan Current plan remains appropriate    Co-evaluation             End of Session Equipment Utilized During Treatment: Gait belt Activity Tolerance: Patient tolerated treatment well Patient left: in chair;with call bell/phone within reach;with family/visitor present     Time: 1007-1027 PT Time Calculation (min) (ACUTE ONLY): 20 min  Charges:  $Gait Training: 8-22 mins  G Codes:      Tommy Sullivan 07/21/2016, 2:05 PM  Mee Hives, PT MS Acute Rehab Dept. Number: [place]

## 2016-07-21 NOTE — Progress Notes (Addendum)
      [address]       [place] [address]             [phone]        7 Days Post-Op Procedure(s) (LRB): CORONARY ARTERY BYPASS GRAFTING (CABG), ON PUMP, TIMES FIVE, USING LEFT INTERNAL MAMMARY ARTERY, RIGHT GREATER SAPHENOUS VEIN HARVESTED ENDOSCOPICALLY (N/A) TRANSESOPHAGEAL ECHOCARDIOGRAM (TEE) (N/A)  Subjective: Patient without complaints this am.  Objective: Vital signs in last 24 hours: Temp:  [97.3 F (36.3 C)-98.1 F (36.7 C)] 97.3 F (36.3 C) (10/20 0413) Pulse Rate:  [86-92] 86 (10/20 0413) Cardiac Rhythm: A-V Sequential paced (10/19 1930) Resp:  [16-18] 18 (10/20 0413) BP: (100-119)/(62-85) 119/82 (10/20 0413) SpO2:  [95 %-100 %] 98 % (10/20 0413) Weight:  [231 lb 12.8 oz (105.1 kg)] 231 lb 12.8 oz (105.1 kg) (10/20 0409)  Pre op weight 104.3 kg Current Weight  07/21/16 231 lb 12.8 oz (105.1 kg)       Intake/Output from previous day: 10/19 0701 - 10/20 0700 In: 120 [P.O.:120] Out: 2275 [Urine:2275]   Physical Exam:  Cardiovascular: Paced, RRR Pulmonary: Clear to auscultation bilaterally Abdomen: Soft, non tender, bowel sounds present. Extremities: Trace bilateral lower extremity edema. Wounds: Clean and dry.  No erythema or signs of infection.  Lab Results: CBC: Recent Labs  07/19/16 1244  WBC 10.6*  HGB 10.6*  HCT 32.2*  PLT 165   BMET:  Recent Labs  07/19/16 1244 07/21/16 0254  NA 131* 131*  K 4.0 3.7  CL 91* 90*  CO2 31 33*  GLUCOSE 114* 89  BUN 36* 38*  CREATININE 1.37* 1.21  CALCIUM 8.2* 8.6*    PT/INR:  Lab Results  Component Value Date   INR 1.44 07/14/2016   INR 1.23 07/10/2016   INR 1.1 07/05/2016   ABG:  INR: Will add last result for INR, ABG once components are confirmed Will add last 4 CBG results once components are confirmed  Assessment/Plan:  1. CV - Previous a fib. A paced at 90 this am. On Amiodarone 400 mg bid, Lopressor 12.5 mg bid. Hope to disconnect external pacer soon. 2.   Pulmonary - On room air. CXR this am appears to show mild atelectasis and is stable . Encourage incentive spirometer. 3. Creatinine decreased from 1.37 to 1.21. Not on Lasix but may need-monitor. 4.  Acute blood loss anemia - Last H and H stable at 10.6 and 32.2 5. Supplement potassium 6. DM-CBGs 165/112/88. On Insulin. Pre op HGA1C 8.8. Patient states previously taken Metformin at large doses. Will discuss management with Dr. Prescott Gum. Patient will need close follow up at discharge. 7. Hyponatremia-sodium remains 131. Likely elated to diuresis  ZIMMERMAN,DONIELLE MPA-C 07/21/2016,8:03 AM  Roll and tape pacing wires today Remove epicardial pacing wires tomorrow if patient short rhythm remained stable Plan discharge home on twice a day dosing of Levemir-patient did not tolerate metformin patient examined and medical record reviewed,agree with above note. Tharon Aquas Trigt III 07/21/2016

## 2016-07-22 ENCOUNTER — Inpatient Hospital Stay (HOSPITAL_COMMUNITY): Payer: 59

## 2016-07-22 DIAGNOSIS — I2589 Other forms of chronic ischemic heart disease: Secondary | ICD-10-CM

## 2016-07-22 DIAGNOSIS — I5021 Acute systolic (congestive) heart failure: Secondary | ICD-10-CM | POA: Diagnosis present

## 2016-07-22 DIAGNOSIS — I498 Other specified cardiac arrhythmias: Secondary | ICD-10-CM | POA: Diagnosis present

## 2016-07-22 DIAGNOSIS — I499 Cardiac arrhythmia, unspecified: Secondary | ICD-10-CM

## 2016-07-22 DIAGNOSIS — I480 Paroxysmal atrial fibrillation: Secondary | ICD-10-CM | POA: Diagnosis present

## 2016-07-22 LAB — GLUCOSE, CAPILLARY
GLUCOSE-CAPILLARY: 145 mg/dL — AB (ref 65–99)
GLUCOSE-CAPILLARY: 128 mg/dL — AB (ref 65–99)
Glucose-Capillary: 153 mg/dL — ABNORMAL HIGH (ref 65–99)
Glucose-Capillary: 195 mg/dL — ABNORMAL HIGH (ref 65–99)
Glucose-Capillary: 94 mg/dL (ref 65–99)

## 2016-07-22 LAB — ECHOCARDIOGRAM LIMITED
FS: 13 % — AB (ref 28–44)
Height: 69.5 in
IVS/LV PW RATIO, ED: 1.05
LA ID, A-P, ES: 50 mm
LA diam end sys: 50 mm
LA diam index: 2.26 cm/m2
LV PW d: 10.6 mm — AB (ref 0.6–1.1)
LVOT area: 3.46 cm2
LVOT diameter: 21 mm
Reg peak vel: 255 cm/s
TR max vel: 255 cm/s
Weight: 3700.8 oz

## 2016-07-22 LAB — CBC
HCT: 33.9 % — ABNORMAL LOW (ref 39.0–52.0)
Hemoglobin: 11.2 g/dL — ABNORMAL LOW (ref 13.0–17.0)
MCH: 29.4 pg (ref 26.0–34.0)
MCHC: 33 g/dL (ref 30.0–36.0)
MCV: 89 fL (ref 78.0–100.0)
Platelets: 284 10*3/uL (ref 150–400)
RBC: 3.81 MIL/uL — ABNORMAL LOW (ref 4.22–5.81)
RDW: 14.7 % (ref 11.5–15.5)
WBC: 13.7 10*3/uL — ABNORMAL HIGH (ref 4.0–10.5)

## 2016-07-22 LAB — BASIC METABOLIC PANEL
Anion gap: 9 (ref 5–15)
BUN: 32 mg/dL — ABNORMAL HIGH (ref 6–20)
CO2: 31 mmol/L (ref 22–32)
Calcium: 8.5 mg/dL — ABNORMAL LOW (ref 8.9–10.3)
Chloride: 90 mmol/L — ABNORMAL LOW (ref 101–111)
Creatinine, Ser: 1.14 mg/dL (ref 0.61–1.24)
GFR calc Af Amer: >60 mL/min (ref >60)
GFR calc non Af Amer: >60 mL/min (ref >60)
Glucose, Bld: 105 mg/dL — ABNORMAL HIGH (ref 65–99)
Potassium: 3.6 mmol/L (ref 3.5–5.1)
Sodium: 130 mmol/L — ABNORMAL LOW (ref 135–145)

## 2016-07-22 MED ORDER — FUROSEMIDE 40 MG PO TABS
40.0000 mg | ORAL_TABLET | Freq: Every day | ORAL | Status: DC
  Administered 2016-07-22 – 2016-07-23 (×2): 40 mg via ORAL
  Filled 2016-07-22 (×2): qty 1

## 2016-07-22 NOTE — Progress Notes (Addendum)
      [address]       [place] [address]             [phone]      8 Days Post-Op Procedure(s) (LRB): CORONARY ARTERY BYPASS GRAFTING (CABG), ON PUMP, TIMES FIVE, USING LEFT INTERNAL MAMMARY ARTERY, RIGHT GREATER SAPHENOUS VEIN HARVESTED ENDOSCOPICALLY (N/A) TRANSESOPHAGEAL ECHOCARDIOGRAM (TEE) (N/A)   Subjective:  No new complaints.  He is feeling better and feels like he is breathing better.   + BM  Objective: Vital signs in last 24 hours: Temp:  [97.6 F (36.4 C)-97.7 F (36.5 C)] 97.7 F (36.5 C) (10/21 0454) Pulse Rate:  [63-71] 71 (10/21 0454) Cardiac Rhythm: Heart block (10/21 0700) Resp:  [18] 18 (10/21 0454) BP: (108-125)/(71-78) 108/71 (10/21 0454) SpO2:  [98 %-99 %] 98 % (10/21 1007) Weight:  [231 lb 4.8 oz (104.9 kg)] 231 lb 4.8 oz (104.9 kg) (10/21 0454)  Intake/Output from previous day: 10/20 0701 - 10/21 0700 In: 720 [P.O.:720] Out: 1675 [Urine:1675]  General appearance: alert, cooperative and no distress Heart: regular rate and rhythm Lungs: clear to auscultation bilaterally Abdomen: soft, non-tender; bowel sounds normal; no masses,  no organomegaly Extremities: edema 1+ Wound: clean and dry  Lab Results:  Recent Labs  07/19/16 1244 07/22/16 0353  WBC 10.6* 13.7*  HGB 10.6* 11.2*  HCT 32.2* 33.9*  PLT 165 284   BMET:  Recent Labs  07/21/16 0254 07/22/16 0353  NA 131* 130*  K 3.7 3.6  CL 90* 90*  CO2 33* 31  GLUCOSE 89 105*  BUN 38* 32*  CREATININE 1.21 1.14  CALCIUM 8.6* 8.5*    PT/INR: No results for input(s): LABPROT, INR in the last 72 hours. ABG    Component Value Date/Time   PHART 7.346 (L) 07/16/2016 1900   HCO3 25.4 07/16/2016 1900   TCO2 27 07/16/2016 1900   ACIDBASEDEF 1.0 07/16/2016 1900   O2SAT 94.0 07/16/2016 1900   CBG (last 3)   Recent Labs  07/21/16 2120 07/22/16 0645 07/22/16 1121  GLUCAP 114* 145* 195*    Assessment/Plan: S/P Procedure(s) (LRB): CORONARY ARTERY BYPASS  GRAFTING (CABG), ON PUMP, TIMES FIVE, USING LEFT INTERNAL MAMMARY ARTERY, RIGHT GREATER SAPHENOUS VEIN HARVESTED ENDOSCOPICALLY (N/A) TRANSESOPHAGEAL ECHOCARDIOGRAM (TEE) (N/A)  1. CV- NSR, occasional PVCs- on Amiodarone, Lopressor, will d/c EPW 2. Pulm- no acute issues, good use of IS, some wheezing continue nebs, inhalers 3. Renal- creatinine has been WNL, remains hypervolemic, continue Lasix 4. DM- sugars have been okay in hospital, plan to send home on insulin 5. Dispo- patient stable, still having PVCs, will d/c EPW today, titrate BB as allowed, continue aggressive pulm toilet   LOS: 12 days    BARRETT, ERIN 07/22/2016  I have seen and examined the patient and agree with the assessment and plan as outlined.  Rexene Alberts, MD 07/22/2016 2:11 PM

## 2016-07-22 NOTE — Progress Notes (Signed)
EPWs pulled per protocol, no ectopy or other issue noted at this time.  Instructed on need for one hour of bedrest with VS monitoring.  Will continue to monitor.   

## 2016-07-22 NOTE — Progress Notes (Signed)
CARDIAC REHAB PHASE I   Pt sitting up in recliner, states he has ambulated twice today, declines ambulation at this time. Cardiac surgery discharge education completed with pt and wife at bedside. Reviewed risk factors, IS, sternal precautions, activity progression, exercise, heart healthy diet, carb counting, sodium and fluid restrictions, daily weights, and phase 2 cardiac rehab. Pt and wife verbalized understanding. Pt agrees to phase 2 cardiac rehab referral, will send to [facility] per pt request. Pt in recliner call bell within reach. Will follow-up Monday if pt does not discharge over the weekend.  [place], RN, BSN 07/22/2016 12:22 PM

## 2016-07-22 NOTE — Progress Notes (Signed)
  Echocardiogram 2D Echocardiogram has been performed.  Johny Chess 07/22/2016, 9:12 AM

## 2016-07-22 NOTE — Progress Notes (Signed)
Patient Name: Tommy Sullivan Date of Encounter: 07/22/2016  Primary Cardiologist: Dr. Antionette Char Problem List     Active Problems:   Unstable angina (HCC)   S/P CABG x 5     Subjective     Inpatient Medications    Scheduled Meds: . amiodarone  400 mg Oral BID  . aspirin EC  325 mg Oral Daily  . bisacodyl  10 mg Oral Daily   Or  . bisacodyl  10 mg Rectal Daily  . budesonide (PULMICORT) nebulizer solution  0.5 mg Nebulization BID  . docusate sodium  200 mg Oral Daily  . enoxaparin (LOVENOX) injection  40 mg Subcutaneous Q24H  . guaiFENesin  1,200 mg Oral BID  . insulin aspart  0-24 Units Subcutaneous Q4H  . insulin detemir  20 Units Subcutaneous BID  . latanoprost  1 drop Left Eye QHS  . mouth rinse  15 mL Mouth Rinse BID  . metoprolol tartrate  12.5 mg Oral BID  . pantoprazole  40 mg Oral Daily  . rosuvastatin  10 mg Oral QHS  . sodium chloride flush  3 mL Intravenous Q12H   Continuous Infusions: . sodium chloride Stopped (07/16/16 2000)  . sodium chloride 250 mL (07/18/16 1200)  . sodium chloride Stopped (07/17/16 0700)  . lactated ringers Stopped (07/16/16 2000)  . lactated ringers Stopped (07/16/16 2000)   PRN Meds: sodium chloride, levalbuterol, metoprolol, ondansetron (ZOFRAN) IV, oxyCODONE, sodium chloride flush   Vital Signs    Vitals:   07/21/16 2020 07/21/16 2210 07/22/16 0454 07/22/16 1007  BP: 125/78  108/71   Pulse: 63  71   Resp: 18  18   Temp: 97.6 F (36.4 C)  97.7 F (36.5 C)   TempSrc: Oral  Oral   SpO2: 98% 99% 98% 98%  Weight:   231 lb 4.8 oz (104.9 kg)   Height:        Intake/Output Summary (Last 24 hours) at 07/22/16 1033 Last data filed at 07/22/16 0123  Gross per 24 hour  Intake              480 ml  Output             1225 ml  Net             -745 ml   Filed Weights   07/20/16 0422 07/21/16 0409 07/22/16 0454  Weight: 236 lb 6.4 oz (107.2 kg) 231 lb 12.8 oz (105.1 kg) 231 lb 4.8 oz (104.9 kg)    Physical  Exam    GEN: Well nourished, well developed, in no acute distress.  HEENT: Grossly normal.  Neck: Supple, no JVD, carotid bruits, or masses. Cardiac: irreg irreg, no murmurs, rubs, or gallops. No clubbing, cyanosis, edema.  Radials/DP/PT 2+ and equal bilaterally.  Respiratory: Mild wheeze BLL improved.  GI: Soft, nontender, nondistended, BS + x 4. MS: no deformity or atrophy. Skin: pale. Neuro:  Strength and sensation are intact. Psych: AAOx3.  Normal affect.  Labs    CBC  Recent Labs  07/19/16 1244 07/22/16 0353  WBC 10.6* 13.7*  HGB 10.6* 11.2*  HCT 32.2* 33.9*  MCV 88.5 89.0  PLT 165 [id number]   Basic Metabolic Panel  Recent Labs  07/19/16 1244 07/21/16 0254 07/22/16 0353  NA 131* 131* 130*  K 4.0 3.7 3.6  CL 91* 90* 90*  CO2 31 33* 31  GLUCOSE 114* 89 105*  BUN 36* 38* 32*  CREATININE 1.37* 1.21 1.14  CALCIUM  8.2* 8.6* 8.5*  MG 2.2  --   --      Telemetry    A V paced (not flutter)- Personally Reviewed  ECG    Sinus with frequent PVC's, bigem pattern.  - Personally Reviewed  Radiology    Dg Chest 2 View  Result Date: 07/21/2016 CLINICAL DATA:  Recent CABG procedure.  No chest complaints. EXAM: CHEST  2 VIEW COMPARISON:  07/19/2016 FINDINGS: Blunting at both costophrenic angles are suggestive for pleural effusions. Few densities in the left lower chest are most compatible with areas of atelectasis or scar. Negative for pneumothorax. Heart size is within normal limits with median sternotomy wires. The trachea is midline. Retained epicardial pacer wires are noted. No acute bone abnormality. IMPRESSION: Tiny pleural effusions. Few densities in the left lower lung are suggestive for mild atelectasis or scarring. No pulmonary edema. Electronically Signed   By: Markus Daft M.D.   On: 07/21/2016 08:51    Cardiac Studies   ECHO: 07/22/16  LV EF: 30% -   35%  ------------------------------------------------------------------- Indications:      Cardiomyopathy -  ischemic 414.8.  ------------------------------------------------------------------- Study Conclusions  - Left ventricle: Wall thickness was increased in a pattern of mild   LVH. Systolic function was moderately to severely reduced. The   estimated ejection fraction was in the range of 30% to 35%. The   study is not technically sufficient to allow evaluation of LV   diastolic function. LV filling pressures are elevated. - Aortic valve: Trileaflet; mildly calcified leaflets. There was no   stenosis. There was no regurgitation. - Mitral valve: Mildly thickened leaflets . There was mild   regurgitation. - Left atrium: The atrium was mildly dilated. - Right ventricle: The cavity size was mildly dilated. - Tricuspid valve: There was mild regurgitation. - Pulmonary arteries: PA peak pressure: 34 mm Hg (S). - Systemic veins: The IVC measures <2.1 cm, but does not collapse   more than 50%, suggesting an RA pressure of 8 mmHg.  Impressions:  - Compared to the prior echo on 07/12/16, the LVEF has dropped to   30-35% with anterior and inferior wall motion abnormalities.  Patient Profile     70 year old with CABG, severe CAD, unstable angina, ischemic cardiomyopathy.   Assessment & Plan    Unstable angina: Cardiac catheterization showed severe three-vessel coronary artery disease with reduced LV systolic function [id number].   CABG - Dr. Prescott Gum - LVEF now appears further reduced to 30-35% with inferior and anterior wall motion abnormalities. He denies further chest pain.  Post op AFIB - appears to be maintaining sinus with PVC's overnight, LBBB/anterior infarct pattern  - metop low dose - bp precludes increase  - PO AMIO currently  - ECG from 07/18/16 at 0043 shows AFIB - now sinus  - pacer wires out - no significant bradycardia noted overnight  Acute on chronic systolic heart failure: His LVEDP was moderately elevated. 10L negative. Weight stable at 231 lbs - considered his dry weight.  Restart lasix 40 mg po daily. Consider adding low dose ARB if BP tolerates.  Status post atrial flutter ablation:  see above   Hyperlipidemia: Continue treatment with rosuvastatin.  Type 2 diabetes:   Hold metformin. Sliding scale coverage.  Sleep apnea:    On CPAP.   Pixie Casino, MD, [facility] Attending Cardiologist [place], MD  07/22/2016, 10:33 AM

## 2016-07-23 LAB — GLUCOSE, CAPILLARY: Glucose-Capillary: 97 mg/dL (ref 65–99)

## 2016-07-23 MED ORDER — AMIODARONE HCL 200 MG PO TABS: 200.0000 mg | ORAL_TABLET | Freq: Two times a day (BID) | ORAL | 1 refills | Status: DC

## 2016-07-23 MED ORDER — ASPIRIN 325 MG PO TBEC: 325.0000 mg | DELAYED_RELEASE_TABLET | Freq: Every day | ORAL | 0 refills | Status: DC

## 2016-07-23 MED ORDER — "PEN NEEDLES 1/2"" 29G X 12MM MISC": 1.0000 [IU] | Freq: Two times a day (BID) | 12 refills | Status: DC

## 2016-07-23 MED ORDER — INSULIN ASPART 100 UNIT/ML ~~LOC~~ SOLN: 0.0000 [IU] | Freq: Three times a day (TID) | SUBCUTANEOUS | Status: DC

## 2016-07-23 MED ORDER — OXYCODONE HCL 5 MG PO TABS: 5.0000 mg | ORAL_TABLET | ORAL | 0 refills | Status: DC | PRN

## 2016-07-23 MED ORDER — INSULIN GLARGINE 100 UNITS/ML SOLOSTAR PEN: 20.0000 [IU] | PEN_INJECTOR | Freq: Two times a day (BID) | SUBCUTANEOUS | 11 refills | Status: DC

## 2016-07-23 MED ORDER — METOPROLOL TARTRATE 25 MG PO TABS: 25.0000 mg | ORAL_TABLET | Freq: Two times a day (BID) | ORAL | Status: DC

## 2016-07-23 MED ORDER — METOPROLOL TARTRATE 25 MG PO TABS: 25.0000 mg | ORAL_TABLET | Freq: Two times a day (BID) | ORAL | 3 refills | Status: DC

## 2016-07-23 MED ORDER — FUROSEMIDE 40 MG PO TABS: 40.0000 mg | ORAL_TABLET | Freq: Every day | ORAL | 0 refills | Status: DC

## 2016-07-23 NOTE — Progress Notes (Signed)
      [address]       [place] [address]             [phone]      9 Days Post-Op Procedure(s) (LRB): CORONARY ARTERY BYPASS GRAFTING (CABG), ON PUMP, TIMES FIVE, USING LEFT INTERNAL MAMMARY ARTERY, RIGHT GREATER SAPHENOUS VEIN HARVESTED ENDOSCOPICALLY (N/A) TRANSESOPHAGEAL ECHOCARDIOGRAM (TEE) (N/A)   Subjective:  No complaints.  Feels great.  Walking hallways without difficulty.  Wants to go home + BM  Objective: Vital signs in last 24 hours: Temp:  [97.9 F (36.6 C)-98.2 F (36.8 C)] 97.9 F (36.6 C) (10/22 0418) Pulse Rate:  [73-81] 81 (10/22 0852) Cardiac Rhythm: Normal sinus rhythm (10/22 0701) Resp:  [16-18] 18 (10/22 0418) BP: (109-120)/(73-78) 120/77 (10/22 0852) SpO2:  [97 %-100 %] 100 % (10/22 0418) Weight:  [232 lb (105.2 kg)] 232 lb (105.2 kg) (10/22 0418)  Intake/Output from previous day: 10/21 0701 - 10/22 0700 In: 720 [P.O.:720] Out: 1175 [Urine:1175]  General appearance: alert, cooperative and no distress Heart: regular rate and rhythm Lungs: clear to auscultation bilaterally Abdomen: soft, non-tender; bowel sounds normal; no masses,  no organomegaly Extremities: edema trace Wound: clean and dry  Lab Results:  Recent Labs  07/22/16 0353  WBC 13.7*  HGB 11.2*  HCT 33.9*  PLT 284   BMET:  Recent Labs  07/21/16 0254 07/22/16 0353  NA 131* 130*  K 3.7 3.6  CL 90* 90*  CO2 33* 31  GLUCOSE 89 105*  BUN 38* 32*  CREATININE 1.21 1.14  CALCIUM 8.6* 8.5*    PT/INR: No results for input(s): LABPROT, INR in the last 72 hours. ABG    Component Value Date/Time   PHART 7.346 (L) 07/16/2016 1900   HCO3 25.4 07/16/2016 1900   TCO2 27 07/16/2016 1900   ACIDBASEDEF 1.0 07/16/2016 1900   O2SAT 94.0 07/16/2016 1900   CBG (last 3)   Recent Labs  07/22/16 2021 07/22/16 2348 07/23/16 0414  GLUCAP 153* 94 97    Assessment/Plan: S/P Procedure(s) (LRB): CORONARY ARTERY BYPASS GRAFTING (CABG), ON PUMP, TIMES FIVE,  USING LEFT INTERNAL MAMMARY ARTERY, RIGHT GREATER SAPHENOUS VEIN HARVESTED ENDOSCOPICALLY (N/A) TRANSESOPHAGEAL ECHOCARDIOGRAM (TEE) (N/A)  1. CV-NSR with PVCs- continue Amiodarone, will increase BB 2. Pulm- no acute issues, off oxygen, continue IS 3. Renal- creatinine continues to improve, remains hypervolemic, continue Lasix 4. DM- sugars controlled, will d/c home on insulin 5. Dispo- patient stable, NSR with PVCS, increase BB, insulin teaching complete... D/c home today  LOS: 13 days    Tommy Sullivan 07/23/2016

## 2016-07-24 ENCOUNTER — Other Ambulatory Visit: Payer: Self-pay

## 2016-07-24 DIAGNOSIS — G4733 Obstructive sleep apnea (adult) (pediatric): Secondary | ICD-10-CM | POA: Diagnosis not present

## 2016-07-24 NOTE — Patient Outreach (Signed)
[facility]) Care Management  07/24/2016  RUE ROUTON [birth year] [id number]   Telephone call for transition of care call.  Member was hospitalized for CABG on 07/14/16.  No answer and message left. Peter Garter RN, [facility] Care Management Coordinator-Link to [place] Management [phone]

## 2016-07-25 ENCOUNTER — Telehealth: Payer: Self-pay

## 2016-07-25 ENCOUNTER — Other Ambulatory Visit: Payer: Self-pay

## 2016-07-25 NOTE — Telephone Encounter (Signed)
Transition Care Management Follow-up Telephone Call   Date discharged? 07/23/16   How have you been since you were released from the hospital? Pt feeling the same as when discharged from hospital; not on any pain med; no CP.   Do you understand why you were in the hospital? yes   Do you understand the discharge instructions? yes   Where were you discharged to? Home    Items Reviewed:  Medications reviewed: Yes;pt had no questions  Allergies reviewed: Yes  Dietary changes reviewed: Yes pt on heart healthy, low Na and diabetic diet.  Referrals reviewed:Pt said all appts with specialist were scheduled before leaving the hospital.    Functional Questionnaire:  Activities of Daily Living (ADLs):   He states they are independent in the following: ambulation,bathing and hygiene,feeding,continence and grooming States they require assistance with the following: none  Any transportation issues/concerns?: no   Any patient concerns?no    Confirmed importance and date/time of follow-up visits scheduled 08/01/16 at 11:45 AM  Provider Appointment booked with Dr Eliezer Lofts.  Confirmed with patient if condition begins to worsen call PCP or go to the ER.  Patient was given the office number and encouraged to call back with question or concerns. yes

## 2016-07-25 NOTE — Patient Outreach (Signed)
[place] [facility]) Care Management  07/25/2016  MAVRIK FREIMARK 1946/08/03 [id number]   Telephone call for transition of care call.  Member was hospitalized for CABG on 07/14/16 and discharged on 07/23/16  Subjective: Member states it is slow going.  States that he is slowly starting to move more.  States he has not had a bowel movement and wife states they are going to give him some Miralax that the PA reccommended.   States that he is weighting daily and his weights have been stable at 233 and 234.  States he does have some swelling in his legs and it is difficult to put his compression stockings on later in the day.  States he does have some SOB when moving around and he does not feel that he is making progress with his spirometer.  Wife states she is giving him his medications and his insulin without difficulty.  States that his blood sugars have been much better and are ranging 130-165 after meals.  States that they have not called Dr. Diona Browner yet to schedule follow up appt but he does have appts with cardiology and the surgeon.  States he has not needed to take any pain medications and his head is getting clearer now.    Patient was recently discharged from hospital and all medications have been reviewed.  Assessment: Telephone call for transition of care call.  Member was hospitalized for CABG on 07/14/16 and discharged on 07/23/16.  Member denies any s/s of infection in his incisions.  Member is weighting daily and can verbalize when to call MD.  Member has not been walking much and needs encouragement to use his incentive spirometer. Member needs to call primary care provider for post d/c follow up call.  [facility] CM Care Plan Problem One   Flowsheet Row Most Recent Value  Care Plan Problem One  Elevated blood sugars related to dx of Type 2 DM as evidenced by hemoglobin A1C of 7.8  Role Documenting the Problem One  Care Management Coordinator  Care Plan for Problem One  Active   THN Long Term Goal (31-90 days)  Member will decrease his hemoglobin A1C to 7.0 or below in the next 90 days  THN Long Term Goal Start Date  06/14/16 [Continue last hemoglobin A1C  improved to 7.9%]  Interventions for Problem One Long Term Goal   Reviewed CHO counting and portion control, Reinforced  to have protein with each meal and snacks,, Reinforced on the importance of  regular exercise to glycemic control, Instructed that he will no longer be eligible for the Link to Wellness program when he no longer has the [employer] and his case will be closed, discussed transitioning to Medicare   [facility] CM Short Term Goal #1 (0-30 days)  Member will not be readmitted to hospital in the next 30 days  [facility] CM Short Term Goal #1 Start Date  07/24/16  Interventions for Short Term Goal #1  Completed transition of care phone call,Instructed to call primary care provider for follow up appt,  Instructed member and wife to slowly progress his activity, Encouraged to use his incentive spirometer regulary, Instructed to take compresson hose off at night and put back on first thing in the morning, instructed wife on ways to make putting hose on easier, Reviewed s/s of complications after surgery to call MD, Instructed to continue to follow a heart healthy low CHO diet      Plan to call in one week for second  transition of care call. Peter Garter RN, [facility] Care Management Coordinator-Link to [place] Management [phone]

## 2016-07-27 ENCOUNTER — Ambulatory Visit: Payer: 59

## 2016-07-27 ENCOUNTER — Other Ambulatory Visit: Payer: Self-pay | Admitting: *Deleted

## 2016-07-27 ENCOUNTER — Ambulatory Visit (INDEPENDENT_AMBULATORY_CARE_PROVIDER_SITE_OTHER): Payer: Self-pay | Admitting: Physician Assistant

## 2016-07-27 ENCOUNTER — Ambulatory Visit
Admission: RE | Admit: 2016-07-27 | Discharge: 2016-07-27 | Disposition: A | Discharge status: Home / Self Care | Payer: 59 | Source: Ambulatory Visit | Attending: Cardiothoracic Surgery | Admitting: Cardiothoracic Surgery

## 2016-07-27 VITALS — BP 104/71 | HR 88 | Resp 16 | Ht 69.5 in | Wt 232.5 lb

## 2016-07-27 DIAGNOSIS — I2510 Atherosclerotic heart disease of native coronary artery without angina pectoris: Secondary | ICD-10-CM

## 2016-07-27 DIAGNOSIS — J90 Pleural effusion, not elsewhere classified: Secondary | ICD-10-CM | POA: Diagnosis not present

## 2016-07-27 DIAGNOSIS — Z951 Presence of aortocoronary bypass graft: Secondary | ICD-10-CM

## 2016-07-27 DIAGNOSIS — R0602 Shortness of breath: Secondary | ICD-10-CM

## 2016-07-27 MED ORDER — METOLAZONE 5 MG PO TABS: 5.0000 mg | ORAL_TABLET | Freq: Every day | ORAL | 0 refills | Status: DC

## 2016-07-27 MED ORDER — FUROSEMIDE 40 MG PO TABS: 40.0000 mg | ORAL_TABLET | Freq: Every day | ORAL | 0 refills | Status: DC

## 2016-07-27 NOTE — Progress Notes (Signed)
Tommy Sullivan is a 70 y.o. male patient.  1. CAD, multiple vessel   2. S/P CABG x 5    Past Medical History:  Diagnosis Date  . Allergy   . Arthritis   . Atrial flutter ([place])   . Coronary atherosclerosis of unspecified type of vessel, native or graft   . Diabetes mellitus without complication ([place])   . Extrinsic asthma, unspecified   . Glaucoma   . Gout   . Hyperlipidemia   . Hypertension   . Myocardial infarction 1/09   PCI done by Dr. Collene Mares in [place]  . Retinal vascular occlusion, unspecified    Right  . Sleep apnea    No past surgical history pertinent negatives on file.   Allergies  Allergen Reactions  . No Known Allergies    Active Problems:   * No active hospital problems. *  Blood pressure 104/71, pulse 88, resp. rate 16, height 5' 9.5" (1.765 m), weight 232 lb 8 oz (105.5 kg), SpO2 96 %.  Subjective: The patient presents today with worsening shortness of breath and dizziness.  Objective:  Gen: pale, no acute distress Cor: Irregularly irregular, no murmur, gallop or rub Pulm: decreased breath sounds in the left lower lobe, otherwise CTA Abd: soft, non-tender Extremities: 1+ pitting edema in the left leg and 2-3+ pitting edema in the right leg.  Wound: incisions c/d/i without drainage. Erythema around leg wound incision. Skin: diffuse bruising on abdomen and around back. Some also present on right hip   Assessment & Plan  Obtained a chest xray which showed slightly increased left pleural effusion and slightly decreased right pleural effusion. Vitals are stable. Patient has been on Lasix 40mg  daily. He had an increase in his weight by 3 lbs since discharge. He otherwise is healing well. Incisions are without drainage. He has some extensive bruising on his right back/hip/abdomen region. He was likely receiving Lovenox shots while in the hospital and he has been giving himself insulin shots in his abdomen. He also shares he got dizzy while going from a laying  position to a sitting position. It quickly went away. He had one low blood glucose level of 80, but after eating it went up to 120. I discussed the patient with Dr. Prescott Gum and we have decided to give 3 days of metolazone and continue his daily lasix. We obtained a rhythm strip and it showed NSR in the 70s with frequent PVCs which is consistent with his hospital rhythm.  He is to return to the office in 1 week. Nursing reviewed insulin regimen and the patient does not need any more supplies. He is instructed to continue to weigh himself daily and if he continued to gain weight or feel poorly he is to call our office. He is also instructed to call his cardiologist and make an appointment for follow-up.   Elgie Collard 07/27/2016

## 2016-07-31 ENCOUNTER — Other Ambulatory Visit: Payer: Self-pay

## 2016-07-31 ENCOUNTER — Other Ambulatory Visit: Payer: Self-pay | Admitting: Internal Medicine

## 2016-07-31 NOTE — Patient Outreach (Signed)
[place] [facility]) Care Management  07/31/2016  Tommy Sullivan [birth year] [id number]   Telephone call for transition of care call #2.Member was hospitalized for CABG on 07/14/16 and discharged on 07/23/16.  No answer and message left.   Plan to call again 08/01/16.  Peter Garter RN, [facility] Care Management Coordinator-Link to [place] Management [phone]

## 2016-08-01 ENCOUNTER — Ambulatory Visit (INDEPENDENT_AMBULATORY_CARE_PROVIDER_SITE_OTHER): Payer: 59 | Admitting: Family Medicine

## 2016-08-01 ENCOUNTER — Encounter: Payer: Self-pay | Admitting: Family Medicine

## 2016-08-01 ENCOUNTER — Telehealth: Payer: Self-pay | Admitting: Internal Medicine

## 2016-08-01 VITALS — BP 92/52 | HR 74 | Temp 98.5°F | Ht 69.5 in | Wt 222.0 lb

## 2016-08-01 DIAGNOSIS — Z951 Presence of aortocoronary bypass graft: Secondary | ICD-10-CM

## 2016-08-01 DIAGNOSIS — E119 Type 2 diabetes mellitus without complications: Secondary | ICD-10-CM

## 2016-08-01 DIAGNOSIS — I5021 Acute systolic (congestive) heart failure: Secondary | ICD-10-CM

## 2016-08-01 DIAGNOSIS — I10 Essential (primary) hypertension: Secondary | ICD-10-CM

## 2016-08-01 DIAGNOSIS — R0603 Acute respiratory distress: Secondary | ICD-10-CM

## 2016-08-01 DIAGNOSIS — I209 Angina pectoris, unspecified: Secondary | ICD-10-CM

## 2016-08-01 LAB — BASIC METABOLIC PANEL
BUN: 24 mg/dL — AB (ref 6–23)
CALCIUM: 10 mg/dL (ref 8.4–10.5)
CO2: 30 mEq/L (ref 19–32)
Chloride: 94 mEq/L — ABNORMAL LOW (ref 96–112)
Creatinine, Ser: 1.24 mg/dL (ref 0.40–1.50)
GFR: 61.16 mL/min (ref >60.00)
GLUCOSE: 93 mg/dL (ref 70–99)
Potassium: 3.8 mEq/L (ref 3.5–5.1)
Sodium: 134 mEq/L — ABNORMAL LOW (ref 135–145)

## 2016-08-01 NOTE — Assessment & Plan Note (Signed)
BP low given overdiuresis. Will eval BMET for kidney function and electrolyte eval.

## 2016-08-01 NOTE — Progress Notes (Signed)
Subjective:    Patient ID: Tommy Sullivan, male    DOB: [birth year], 70 y.o.   MRN: [id number]  HPI    70 year old male presents for hospitalization follow up.  Admit date: 07/10/2016 Discharge date: 07/23/2016   Hosptial course: On 07/14/2016 Mr. Kosar underwent a coronary bypass grafting 5 by Dr. Prescott Gum. He tolerated the procedure well and was transferred to ICU. Postop day 2 he remain sedated and unable to walk. We increased his Lantus for better blood glucose control. His narcotics were significantly reduced at this time. On postoperative day 3 he was much more alert and was able to walk 300 feet. His pain was well-controlled on his new pain medication regimen. He was hemodynamically stable off all inotropes. He remained fluid overloaded therefore a diuretic regimen was initiated. On postop day 4 he went into atrial fibrillation. He was started on the IV amiodarone protocol and converted to normal sinus rhythm. His blood pressure remained stable the entire time. Low-dose Lovenox was initiated. We continued to hold Coumadin at this time. He was transferred to the telemetry unit. On postop day 5 we converted his amiodarone to oral medication. We continued his epicardial wires at this time. He remained on 2 L nasal cannula, therefore he continued to work with his incentive spirometry to try and wean oxygen requirements. His creatinine remains stable as we continued his diuretic regimen for fluid overload. His blood glucose level remained well-controlled. However, preoperatively his A1c is >10.  This is not acceptable post cardiac surgery.  He will be taken off oral anti-glycemics and started on Lantus BID.  He was instructed to record his blood sugars to present to his PCP.  Goal is sugars less than 150 to ensure longevity of his bypass grafts. Cardiology was also following to assist with rhythm management and blood pressure control.  He continued to make good progress.  He is maintaining NSR  with PVCs.  His pacing wires have been removed without difficulty.   Today he reports  Went back to Dr. Nils Pyle for dyspnea.  Started on higher dose diuretic metalazone x 3 days in addition to his lasix 40 mg daily.  Lost 20 lbs. Wt Readings from Last 3 Encounters:  08/01/16 222 lb (100.7 kg)  07/27/16 232 lb 8 oz (105.5 kg)  07/23/16 232 lb (105.2 kg)     He is now on Lantus for DM control. 20 Units BID No SE, no difficulties.  CBGs FBS 81-144 and 2 PP 127-165  Eating healthy diet, smaller portion, low salt and low carb.   BP Readings from Last 3 Encounters:  08/01/16 (!) 92/52  07/27/16 104/71  07/23/16 120/77    Review of Systems  Constitutional: Positive for fatigue.  HENT: Negative for ear pain.   Eyes: Negative for pain.  Respiratory: Negative for chest tightness and shortness of breath.   Cardiovascular: Positive for leg swelling. Negative for palpitations.  Gastrointestinal: Negative for abdominal distention.       Objective:   Physical Exam  Constitutional: Vital signs are normal. He appears well-developed and well-nourished.  Pale , fatigued appearing  HENT:  Head: Normocephalic.  Right Ear: Hearing normal.  Left Ear: Hearing normal.  Nose: Nose normal.  Mouth/Throat: Oropharynx is clear and moist and mucous membranes are normal.  Neck: Trachea normal. Carotid bruit is not present. No thyroid mass and no thyromegaly present.  Cardiovascular: Normal rate, regular rhythm and normal pulses.  Exam reveals distant heart sounds. Exam  reveals no gallop and no friction rub.   No murmur heard. 2 plus  peripheral edema right leg  Pulmonary/Chest: Effort normal and breath sounds normal. No respiratory distress.  Skin: Skin is warm, dry and intact. No rash noted.  Chest and right leg surgical wounds healing well  Psychiatric: He has a normal mood and affect. His speech is normal and behavior is normal. Thought content normal.          Assessment & Plan:

## 2016-08-01 NOTE — Assessment & Plan Note (Signed)
Today still swelling in right foot at site of vein harvest, but otherwise appears dehydrated. Hold lasix tommorow and discuss furtehr diuretic need with CVTS. May be able to decrease to lasix 20 mg daily or prn. Needs to wear compression hose for peripheral neuropathy.

## 2016-08-01 NOTE — Telephone Encounter (Signed)
Pt is aware that I have spoken with pharmacy and gave refill approval verbally and they are filling the medication now. Nothing more needed at this time.

## 2016-08-01 NOTE — Assessment & Plan Note (Signed)
Diabetes control much improved on insulin BID. Off oral glycemics. Encouraged exercise, weight loss, healthy eating habits.

## 2016-08-01 NOTE — Assessment & Plan Note (Signed)
Improved s/p CABG and more recently with diuresis.

## 2016-08-01 NOTE — Progress Notes (Signed)
Pre visit review using our clinic review tool, if applicable. No additional management support is needed unless otherwise documented below in the visit note. 

## 2016-08-01 NOTE — Assessment & Plan Note (Signed)
Healing well s/p surgery.

## 2016-08-01 NOTE — Patient Instructions (Addendum)
Hold lasix tommorow and keep appt  with CVTS.  Stop at lab on way out.  Plan flu shot in next few weeks.  Continue lantus twice daily.Marland Kitchen

## 2016-08-02 ENCOUNTER — Telehealth: Payer: Self-pay

## 2016-08-02 ENCOUNTER — Encounter: Payer: Self-pay | Admitting: Cardiothoracic Surgery

## 2016-08-02 DIAGNOSIS — J90 Pleural effusion, not elsewhere classified: Secondary | ICD-10-CM

## 2016-08-02 NOTE — Telephone Encounter (Signed)
Mr Tristan called about weight loss since 07/27/16. He has lost 22 lbs in 5 days. He completed the 3 day zaroxolyn RX and is currently taking Lasix 40 mg per day. He denies any SOB,dizziness and the swelling in his legs are down. He states that his BP's have been on the low side averaging 95/70. He is scheduled to see Dr Prescott Gum on 08/03/16 with CXR. I advised him to hold the lasix today and tomorrow and he could discuss his medications with Dr Prescott Gum at appointment.

## 2016-08-03 ENCOUNTER — Other Ambulatory Visit: Payer: Self-pay | Admitting: Cardiothoracic Surgery

## 2016-08-03 ENCOUNTER — Ambulatory Visit (INDEPENDENT_AMBULATORY_CARE_PROVIDER_SITE_OTHER): Payer: Self-pay | Admitting: Cardiothoracic Surgery

## 2016-08-03 ENCOUNTER — Encounter: Payer: Self-pay | Admitting: Cardiothoracic Surgery

## 2016-08-03 ENCOUNTER — Other Ambulatory Visit: Payer: Self-pay

## 2016-08-03 ENCOUNTER — Ambulatory Visit
Admission: RE | Admit: 2016-08-03 | Discharge: 2016-08-03 | Disposition: A | Discharge status: Home / Self Care | Payer: PPO | Source: Ambulatory Visit | Attending: Cardiothoracic Surgery | Admitting: Cardiothoracic Surgery

## 2016-08-03 ENCOUNTER — Other Ambulatory Visit: Payer: Self-pay | Admitting: *Deleted

## 2016-08-03 VITALS — BP 115/84 | HR 74 | Resp 20 | Ht 69.5 in | Wt 223.0 lb

## 2016-08-03 DIAGNOSIS — Z951 Presence of aortocoronary bypass graft: Secondary | ICD-10-CM

## 2016-08-03 DIAGNOSIS — I2510 Atherosclerotic heart disease of native coronary artery without angina pectoris: Secondary | ICD-10-CM

## 2016-08-03 DIAGNOSIS — J90 Pleural effusion, not elsewhere classified: Secondary | ICD-10-CM

## 2016-08-03 DIAGNOSIS — I5021 Acute systolic (congestive) heart failure: Secondary | ICD-10-CM | POA: Diagnosis not present

## 2016-08-03 DIAGNOSIS — R0602 Shortness of breath: Secondary | ICD-10-CM | POA: Diagnosis not present

## 2016-08-03 LAB — CBC WITH DIFFERENTIAL/PLATELET
Basophils Absolute: 78 cells/uL (ref 0–200)
Basophils Relative: 1 %
Eosinophils Absolute: 1170 cells/uL — ABNORMAL HIGH (ref 15–500)
Eosinophils Relative: 15 %
HCT: 36.2 % — ABNORMAL LOW (ref 38.5–50.0)
Hemoglobin: 11.6 g/dL — ABNORMAL LOW (ref 13.2–17.1)
Lymphocytes Relative: 21 %
Lymphs Abs: 1638 cells/uL (ref 850–3900)
MCH: 29.4 pg (ref 27.0–33.0)
MCHC: 32 g/dL (ref 32.0–36.0)
MCV: 91.6 fL (ref 80.0–100.0)
MPV: 9.1 fL (ref 7.5–12.5)
Monocytes Absolute: 468 cells/uL (ref 200–950)
Monocytes Relative: 6 %
Neutro Abs: 4446 cells/uL (ref 1500–7800)
Neutrophils Relative %: 57 %
Platelets: 267 10*3/uL (ref 140–400)
RBC: 3.95 MIL/uL — ABNORMAL LOW (ref 4.20–5.80)
RDW: 15.1 % — ABNORMAL HIGH (ref 11.0–15.0)
WBC: 7.8 10*3/uL (ref 3.8–10.8)

## 2016-08-03 NOTE — Patient Outreach (Signed)
[name]) Care Management  [place]  08/03/2016   Tommy Sullivan May 25, 1946 [id number]  Telephone call for transition of care call.  Member was hospitalized for CABG on 07/14/16 and discharged on 07/23/16  Subjective:  Member states that he saw Dr.Bedsole on 08/01/16.  States that he is feeling much better today as he slept good last night and yesterday.  States that after taking the stronger fluid pill and the Lasix he has lost 22 lbs since last week.  States he has not been taking the Lasix since he saw Dr.Bedsole as he was told to hold until he saw Dr. Prescott Gum.  States he has an appt at 4 PM to see him today.  Denies any dizziness and is only a little winded after he walked outside to the mailbox and back.  States he is using his incentive spirometer regularly.  States his swelling is much better in his legs and his hose now fit better.  States his blood sugars have been ranging from 92-144 in the morning.  States his B/P was 112/80 this morning which is improved from 94/66.  States his appetite is good and he is eating small low sodium meals.  States he is still having some constipation and he took another dose of Miralax yesterday but it has not worked yet.   Encounter Medications:  Outpatient Encounter Prescriptions as of 08/03/2016  Medication Sig  . amiodarone (PACERONE) 200 MG tablet Take 1 tablet (200 mg total) by mouth 2 (two) times daily.  Marland Kitchen aspirin EC 325 MG EC tablet Take 1 tablet (325 mg total) by mouth daily.  . Cholecalciferol (VITAMIN D3) 1000 units CAPS Take 1,000 Units by mouth daily.   . fenofibrate 160 MG tablet TAKE 1 TABLET BY MOUTH DAILY (Patient taking differently: Take 160 mg by mouth daily)  . furosemide (LASIX) 40 MG tablet Take 1 tablet (40 mg total) by mouth daily. For 7 Days (Patient not taking: Reported on 08/03/2016)  . glucose blood (BAYER CONTOUR NEXT TEST) test strip Use to check blood sugar two times daily.  Dx: E11.9  .  insulin glargine (LANTUS) 100 unit/mL SOPN Inject 0.2 mLs (20 Units total) into the skin 2 (two) times daily at 8 am and 10 pm.  . Insulin Pen Needle (PEN NEEDLES 29GX1/2") 29G X 12MM MISC 1 Units by Does not apply route 2 (two) times daily at 8 am and 10 pm. Please use clean needle for every insulin adminstration  . latanoprost (XALATAN) 0.005 % ophthalmic solution Place 1 drop into the left eye at bedtime.   . metolazone (ZAROXOLYN) 5 MG tablet Take 1 tablet (5 mg total) by mouth daily. (Patient not taking: Reported on 08/03/2016)  . metoprolol tartrate (LOPRESSOR) 25 MG tablet Take 1 tablet (25 mg total) by mouth 2 (two) times daily.  . modafinil (PROVIGIL) 200 MG tablet TAKE 1 TABLET BY MOUTH ONCE DAILY  . Omega-3 Fatty Acids (FISH OIL) 1000 MG CAPS Take 2 mg by mouth 2 (two) times daily.   Marland Kitchen oxyCODONE (OXY IR/ROXICODONE) 5 MG immediate release tablet Take 1 tablet (5 mg total) by mouth every 4 (four) hours as needed for severe pain.  . rosuvastatin (CRESTOR) 10 MG tablet TAKE 1 TABLET BY MOUTH DAILY. (Patient taking differently: Take 10 mg by mouth daily)  . tamsulosin (FLOMAX) 0.4 MG CAPS capsule TAKE 1 CAPSULE BY MOUTH DAILY (Patient taking differently: Take 0.4 mg by mouth in the morning)   No facility-administered  encounter medications on file as of 08/03/2016.       Assessment: Telephone call for transition of care call.  Member was hospitalized for CABG on 07/14/16 and discharged on 07/23/16.  Member denies any s/s of infection in his incisions. Member was seen by PA on 07/03/16 for increased SOB and took Metolazone for 3 days along with his Lasix.  Member has lost 22 lbs since then.  He is now holding the Lasix until visit with Dr.Van Trigt later today. Member reports improved B/P of 112/80 today and less swelling in legs.   Member is weighting daily and can verbalize when to call MD.  Member has started  walking  and  is using his incentive spirometer. Member saw  primary care provider on  08/01/16.    Plan:  [facility] CM Care Plan Problem One   Flowsheet Row Most Recent Value  Care Plan Problem One  Elevated blood sugars related to dx of Type 2 DM as evidenced by hemoglobin A1C of 7.8  Role Documenting the Problem One  Care Management Coordinator  Care Plan for Problem One  Active  [facility]N Long Term Goal (31-90 days)  Member will decrease his hemoglobin A1C to 7.0 or below in the next 90 days  THN Long Term Goal Start Date  06/14/16 [Continue last hemoglobin A1C  improved to 7.9%]  Interventions for Problem One Long Term Goal   THN CM Short Term Goal #1 (0-30 days)  Member will not be readmitted to hospital in the next 30 days  [facility]N CM Short Term Goal #1 Start Date  07/24/16  Interventions for Short Term Goal #1  Reinforced to  member and wife to slowly progress his activity, Encouraged to continue to use his incentive spirometer regularly,  Reinforced s/s of complications after surgery to call MD, Reinforced to continue to follow a heart healthy low CHO diet     Plan to call in one week for follow up and transition of care call #3. Tommy Garter RN, [facility] Care Management Coordinator-Link to [place] Management [phone]

## 2016-08-03 NOTE — Progress Notes (Signed)
PCP is Eliezer Lofts, MD Referring Provider is Wellington Hampshire, MD  Chief Complaint  Patient presents with  . Routine Post Op    1 week f/u with CXR, left pleural effusion. Stopped lasix due to low BP's and weight loss    HPI: The patient returns for follow-up after multivessel CABG last month October 13. He was last seen in the office complaining of shortness of breath and had a small pleural effusion on the left. His diuretics were reinstituted and he was given a short course of supplemental oral metolazone. With this change he lost several pounds of fluid weight and symptoms have significantly improved. He is now outside walking around the neighborhood daily. His weight is below preop. The surgical incisions are well-healed. He has residual swelling of the right leg. He is wearing TED hose. His blood pressure on home measurements have been running [id number] systolic. He has maintained sinus rhythm. He has taken some oxycodone and has some constipation.  Chest x-ray taken today shows resolution the left pleural effusion with clear lung fields.  Past Medical History:  Diagnosis Date  . Allergy   . Arthritis   . Atrial flutter ([place])   . Coronary atherosclerosis of unspecified type of vessel, native or graft   . Diabetes mellitus without complication ([place])   . Extrinsic asthma, unspecified   . Glaucoma   . Gout   . Hyperlipidemia   . Hypertension   . Myocardial infarction 1/09   PCI done by Dr. Collene Mares in [place]  . Retinal vascular occlusion, unspecified    Right  . Sleep apnea     Past Surgical History:  Procedure Laterality Date  . ABLATION  05-07-2014   EPS and ablation of atrial flutter by Dr Caryl Comes  . APPENDECTOMY  1954  . ATRIAL FLUTTER ABLATION N/A 05/07/2014   Procedure: ATRIAL FLUTTER ABLATION;  Surgeon: Deboraha Sprang, MD;  Location: [facility] CATH LAB;  Service: Cardiovascular;  Laterality: N/A;  . CARDIAC CATHETERIZATION Left 07/10/2016   Procedure: Left Heart Cath and Coronary  Angiography;  Surgeon: Wellington Hampshire, MD;  Location: [facility] CV LAB;  Service: Cardiovascular;  Laterality: Left;  . COLONOSCOPY    . CORONARY ANGIOPLASTY  2009   stent [place]  . CORONARY ARTERY BYPASS GRAFT N/A 07/14/2016   Procedure: CORONARY ARTERY BYPASS GRAFTING (CABG), ON PUMP, TIMES FIVE, USING LEFT INTERNAL MAMMARY ARTERY, RIGHT GREATER SAPHENOUS VEIN HARVESTED ENDOSCOPICALLY;  Surgeon: Ivin Poot, MD;  Location: [place];  Service: Open Heart Surgery;  Laterality: N/A;  SVG to PD and PL sequential SVG to 1st diagonal SVG to Ramus LIMA to LAD  . NECK SURGERY  1990's   x2, Fusion  . SHOULDER ARTHROSCOPY WITH SUBACROMIAL DECOMPRESSION Left 04/02/2014   Procedure: SHOULDER ARTHROSCOPY WITH SUBACROMIAL DECOMPRESSION WITH ROTATOR CUFF REPAIR;  Surgeon: Nita Sells, MD;  Location: [place];  Service: Orthopedics;  Laterality: Left;  Left shoulder rotator cuff repair, subacromial decompression  . SKIN SURGERY    . TEE WITHOUT CARDIOVERSION N/A 07/14/2016   Procedure: TRANSESOPHAGEAL ECHOCARDIOGRAM (TEE);  Surgeon: Ivin Poot, MD;  Location: [place];  Service: Open Heart Surgery;  Laterality: N/A;  . TONSILLECTOMY      Family History  Problem Relation Age of Onset  . Diabetes Sister   . Coronary artery disease Brother   . AAA (abdominal aortic aneurysm) Father   . Heart disease Mother   . Diabetes Mother   . Diabetes Brother   .  Colon cancer Neg Hx     Social History Social History  Substance Use Topics  . Smoking status: Former Smoker    Packs/day: 1.00    Years: 20.00    Types: Cigarettes    Quit date: 10/02/1988  . Smokeless tobacco: Never Used     Comment: quit in the 1990's  . Alcohol use 1.8 oz/week    3 Cans of beer per week     Comment: 3-4 drinks per week.     Current Outpatient Prescriptions  Medication Sig Dispense Refill  . amiodarone (PACERONE) 200 MG tablet Take 1 tablet (200 mg total) by mouth 2 (two) times  daily. 60 tablet 1  . aspirin EC 325 MG EC tablet Take 1 tablet (325 mg total) by mouth daily. 30 tablet 0  . Cholecalciferol (VITAMIN D3) 1000 units CAPS Take 1,000 Units by mouth daily.     . fenofibrate 160 MG tablet TAKE 1 TABLET BY MOUTH DAILY (Patient taking differently: Take 160 mg by mouth daily) 90 tablet 1  . glucose blood (BAYER CONTOUR NEXT TEST) test strip Use to check blood sugar two times daily.  Dx: E11.9 100 each 12  . insulin glargine (LANTUS) 100 unit/mL SOPN Inject 0.2 mLs (20 Units total) into the skin 2 (two) times daily at 8 am and 10 pm. 15 mL 11  . Insulin Pen Needle (PEN NEEDLES 29GX1/2") 29G X 12MM MISC 1 Units by Does not apply route 2 (two) times daily at 8 am and 10 pm. Please use clean needle for every insulin adminstration 100 each 12  . latanoprost (XALATAN) 0.005 % ophthalmic solution Place 1 drop into the left eye at bedtime.   3  . metolazone (ZAROXOLYN) 5 MG tablet Take 1 tablet (5 mg total) by mouth daily. 3 tablet 0  . metoprolol tartrate (LOPRESSOR) 25 MG tablet Take 1 tablet (25 mg total) by mouth 2 (two) times daily. 60 tablet 3  . modafinil (PROVIGIL) 200 MG tablet TAKE 1 TABLET BY MOUTH ONCE DAILY 90 tablet 3  . Omega-3 Fatty Acids (FISH OIL) 1000 MG CAPS Take 2 mg by mouth 2 (two) times daily.     Marland Kitchen oxyCODONE (OXY IR/ROXICODONE) 5 MG immediate release tablet Take 1 tablet (5 mg total) by mouth every 4 (four) hours as needed for severe pain. 30 tablet 0  . rosuvastatin (CRESTOR) 10 MG tablet TAKE 1 TABLET BY MOUTH DAILY. (Patient taking differently: Take 10 mg by mouth daily) 90 tablet 1  . tamsulosin (FLOMAX) 0.4 MG CAPS capsule TAKE 1 CAPSULE BY MOUTH DAILY (Patient taking differently: Take 0.4 mg by mouth in the morning) 90 capsule 1  . furosemide (LASIX) 40 MG tablet Take 1 tablet (40 mg total) by mouth daily. For 7 Days (Patient not taking: Reported on 08/03/2016) 7 tablet 0   No current facility-administered medications for this visit.      Allergies  Allergen Reactions  . No Known Allergies     Review of Systems  Appetite is good No fever Minimal chest discomfort No cough Blood sugars well-controlled  BP 115/84 (BP Location: Left Arm, Patient Position: Sitting, Cuff Size: Normal)   Pulse 74   Resp 20   Ht 5' 9.5" (1.765 m)   Wt 223 lb (101.2 kg)   SpO2 97%   BMI 32.46 kg/m  Physical Exam       Exam    General- alert and comfortable   Lungs- clear without rales, wheezes   Cor- regular  rate and rhythm, no murmur , gallop   Abdomen- soft, non-tender   Extremities - warm, non-tender,2+edema right leg   Neuro- oriented, appropriate, no focal weakness   Diagnostic Tests: Chest x-ray clear Basic chemistry profile today was reviewed and is unremarkable. Normal BUN creatinine and sodium.  Impression: Patient is significantly improved after more aggressive diuresis. He will finish his oral Lasix prescription-he has about 4 more tablets. We will reduce his metoprolol at 12.5 mg twice a day as his heart rate has been slow and blood pressure soft. He is encouraged to walk 10 minutes once or twice daily. He understands he should not drive or lift more than 5 pounds. He will keep his scheduled appointment mid-November.   Plan:   Len Childs, MD [facility] [phone]

## 2016-08-04 ENCOUNTER — Ambulatory Visit: Payer: Self-pay

## 2016-08-04 LAB — BASIC METABOLIC PANEL
BUN: 25 mg/dL (ref 7–25)
CO2: 28 mmol/L (ref 20–31)
Calcium: 9.3 mg/dL (ref 8.6–10.3)
Chloride: 98 mmol/L (ref 98–110)
Creat: 1.5 mg/dL — ABNORMAL HIGH (ref 0.70–1.18)
Glucose, Bld: 126 mg/dL — ABNORMAL HIGH (ref 65–99)
Potassium: 4.3 mmol/L (ref 3.5–5.3)
Sodium: 136 mmol/L (ref 135–146)

## 2016-08-04 LAB — BRAIN NATRIURETIC PEPTIDE: Brain Natriuretic Peptide: 485.2 pg/mL — ABNORMAL HIGH (ref <100)

## 2016-08-07 ENCOUNTER — Ambulatory Visit (INDEPENDENT_AMBULATORY_CARE_PROVIDER_SITE_OTHER): Payer: PPO | Admitting: Nurse Practitioner

## 2016-08-07 ENCOUNTER — Encounter: Payer: Self-pay | Admitting: Nurse Practitioner

## 2016-08-07 VITALS — BP 100/62 | HR 77 | Ht 69.5 in | Wt 226.8 lb

## 2016-08-07 DIAGNOSIS — I5021 Acute systolic (congestive) heart failure: Secondary | ICD-10-CM

## 2016-08-07 DIAGNOSIS — E785 Hyperlipidemia, unspecified: Secondary | ICD-10-CM

## 2016-08-07 DIAGNOSIS — I255 Ischemic cardiomyopathy: Secondary | ICD-10-CM

## 2016-08-07 DIAGNOSIS — I119 Hypertensive heart disease without heart failure: Secondary | ICD-10-CM | POA: Insufficient documentation

## 2016-08-07 DIAGNOSIS — I2510 Atherosclerotic heart disease of native coronary artery without angina pectoris: Secondary | ICD-10-CM

## 2016-08-07 DIAGNOSIS — E119 Type 2 diabetes mellitus without complications: Secondary | ICD-10-CM

## 2016-08-07 NOTE — Patient Instructions (Signed)
Medication Instructions:  Please continue your current medications  Labwork: None  Testing/Procedures: None  Follow-Up: With Dr. Fletcher Anon in 2-3 months  If you need a refill on your cardiac medications before your next appointment, please call your pharmacy.

## 2016-08-07 NOTE — Progress Notes (Signed)
Office Visit    Patient Name: Tommy Sullivan Date of Encounter: 08/07/2016  Primary Care Provider:  Eliezer Lofts, MD Primary Cardiologist:  Jerilynn Mages. Fletcher Anon, MD   Chief Complaint    70 year old male status post recent coronary artery bypass grafting, who presents for follow-up.  Past Medical History    Past Medical History:  Diagnosis Date  . Allergy   . Arthritis   . Atrial flutter ([place])    a/ 05/2014 s/p RFCA.  Marland Kitchen CAD (coronary artery disease) 10/2007   a. 10/2007 s/p MI/PCI of RCA [facility] - Dr. Collene Mares);  b. 06/2016 MV: EF 34%, inf/inflat infarct; c. 07/2016 Cath: LM nl, LAd 90ost/prox, 88m, 99d, D2 30, RI small, LCX 160m, Om1 40, RCA 50ost, 5 ISR, 79m, 30d, RPDA  80ost, RPL2 70, EF 45-50%;  d. 07/2016 CABG x 5 (LIMA->LAD, VG->Diag, VG->RI, VG->PDA->RPL).  . Chronic systolic CHF (congestive heart failure) ([place])    a. 07/2016 Echo: Ef 30-35%.  . Diabetes mellitus without complication ([place])   . Extrinsic asthma, unspecified   . Glaucoma   . Gout   . Hyperlipidemia   . Hypertensive heart disease   . Ischemic cardiomyopathy    a. 07/2016 Echo: Ef 30-35%, mildly dil LA, mild MR/TR, PASP 24 mmHg.  Marland Kitchen Post-op Afib following CABG (07/2016)    a. 07/2016 - placed on amio post-op.  Marland Kitchen Retinal vascular occlusion, unspecified    Right  . Sleep apnea    Past Surgical History:  Procedure Laterality Date  . ABLATION  05-07-2014   EPS and ablation of atrial flutter by Dr Caryl Comes  . APPENDECTOMY  1954  . ATRIAL FLUTTER ABLATION N/A 05/07/2014   Procedure: ATRIAL FLUTTER ABLATION;  Surgeon: Deboraha Sprang, MD;  Location: [facility] CATH LAB;  Service: Cardiovascular;  Laterality: N/A;  . CARDIAC CATHETERIZATION Left 07/10/2016   Procedure: Left Heart Cath and Coronary Angiography;  Surgeon: Wellington Hampshire, MD;  Location: [place] CV LAB;  Service: Cardiovascular;  Laterality: Left;  . COLONOSCOPY    . CORONARY ANGIOPLASTY  2009   stent [place]  . CORONARY ARTERY BYPASS GRAFT N/A  07/14/2016   Procedure: CORONARY ARTERY BYPASS GRAFTING (CABG), ON PUMP, TIMES FIVE, USING LEFT INTERNAL MAMMARY ARTERY, RIGHT GREATER SAPHENOUS VEIN HARVESTED ENDOSCOPICALLY;  Surgeon: Ivin Poot, MD;  Location: [place];  Service: Open Heart Surgery;  Laterality: N/A;  SVG to PD and PL sequential SVG to 1st diagonal SVG to Ramus LIMA to LAD  . NECK SURGERY  1990's   x2, Fusion  . SHOULDER ARTHROSCOPY WITH SUBACROMIAL DECOMPRESSION Left 04/02/2014   Procedure: SHOULDER ARTHROSCOPY WITH SUBACROMIAL DECOMPRESSION WITH ROTATOR CUFF REPAIR;  Surgeon: Nita Sells, MD;  Location: [place];  Service: Orthopedics;  Laterality: Left;  Left shoulder rotator cuff repair, subacromial decompression  . SKIN SURGERY    . TEE WITHOUT CARDIOVERSION N/A 07/14/2016   Procedure: TRANSESOPHAGEAL ECHOCARDIOGRAM (TEE);  Surgeon: Ivin Poot, MD;  Location: [place];  Service: Open Heart Surgery;  Laterality: N/A;  . TONSILLECTOMY      Allergies  Allergies  Allergen Reactions  . No Known Allergies     History of Present Illness    70 year old male with the above complex past medical history. He is status post prior MI with stenting the right coronary artery in [place] in 2009.   He was recently evaluated by Dr. Fletcher Anon secondary to progressive dyspnea. Stress testing was performed and showed prior inferior infarct without  evidence of ischemia, though EF was down from prior recording. As result, catheterization was performed in October and revealed multivessel coronary disease with an EF of 45-50%. CT surgery was consult and he subsequently underwent 5 vessel coronary artery bypass. Postoperative course was notable for paroxysmal A. fib, for which he was placed on IV and then oral amiodarone. He was subsequently discharged October 22. He was discharged on a seven-day course of Lasix and then developed dyspnea. He was seen by CT surgery and given metolazone for 3 days. 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 Since then, his weight has leveled off and he is to 20 today. He has not had any recurrence of dyspnea. He has not been having any significant chest wall pain or angina. He is steadily increasing his activity and most recently has been walking for 20 minutes 3 times per day without significant limitations. He has had some right lower extremity swelling and discomfort at the incision site though he has not had any discharge or significant erythema.  He denies PND, orthopnea, palpitations, dizziness, syncope, or early satiety.  Home Medications    Prior to Admission medications   Medication Sig Start Date End Date Taking? Authorizing Provider  amiodarone (PACERONE) 200 MG tablet Take 1 tablet (200 mg total) by mouth 2 (two) times daily. 07/23/16  Yes Erin R Barrett, PA-C  aspirin EC 325 MG EC tablet Take 1 tablet (325 mg total) by mouth daily. 07/24/16  Yes Erin R Barrett, PA-C  Cholecalciferol (VITAMIN D3) 1000 units CAPS Take 1,000 Units by mouth daily.    Yes Historical Provider, MD  fenofibrate 160 MG tablet TAKE 1 TABLET BY MOUTH DAILY Patient taking differently: Take 160 mg by mouth daily 04/21/16  Yes Amy E Bedsole, MD  furosemide (LASIX) 40 MG tablet Take 40 mg by mouth every other day.   Yes Historical Provider, MD  glucose blood (BAYER CONTOUR NEXT TEST) test strip Use to check blood sugar two times daily.  Dx: E11.9 08/06/15  Yes Amy Cletis [place], MD  insulin glargine (LANTUS) 100 unit/mL SOPN Inject 0.2 mLs (20 Units total) into the skin 2 (two) times daily at 8 am and 10 pm. 07/23/16  Yes Erin R Barrett, PA-C  Insulin Pen Needle (PEN NEEDLES 29GX1/2") 29G X 12MM MISC 1 Units by Does not apply route 2 (two) times daily at 8 am and 10 pm. Please use clean needle for every insulin adminstration 07/23/16  Yes Erin R Barrett, PA-C  latanoprost (XALATAN) 0.005 % ophthalmic solution Place  1 drop into the left eye at bedtime.  04/21/16  Yes Historical Provider, MD  metoprolol tartrate (LOPRESSOR) 25 MG tablet Take 1 tablet (25 mg total) by mouth 2 (two) times daily. Patient taking differently: Take 12.5 mg by mouth 2 (two) times daily.  07/23/16  Yes Erin R Barrett, PA-C  modafinil (PROVIGIL) 200 MG tablet TAKE 1 TABLET BY MOUTH ONCE DAILY 08/01/16  Yes Deneise Lever, MD  Omega-3 Fatty Acids (FISH OIL) 1000 MG CAPS Take 2,000 capsules by mouth 2 (two) times daily.    Yes Historical Provider, MD  oxyCODONE (OXY IR/ROXICODONE) 5 MG immediate release tablet Take 1 tablet (5 mg total) by mouth every 4 (four) hours as needed for severe pain. 07/23/16  Yes Erin R Barrett, PA-C  rosuvastatin (CRESTOR) 10 MG tablet TAKE 1 TABLET BY MOUTH  DAILY. Patient taking differently: Take 10 mg by mouth daily 04/21/16  Yes Amy E Bedsole, MD  tamsulosin (FLOMAX) 0.4 MG CAPS capsule TAKE 1 CAPSULE BY MOUTH DAILY Patient taking differently: Take 0.4 mg by mouth in the morning 04/21/16  Yes Amy E Bedsole, MD    Review of Systems    As above, he has been having right lower extremity incisional pain as well as mild right lower extremity edema. He did have dyspnea following discharge however this has since resolved. He has not been having any angina, palpitations, PND, orthopnea, dizziness, syncope, or early satiety.  All other systems reviewed and are otherwise negative except as noted above.  Physical Exam    VS:  BP 100/62   Pulse 77   Ht 5' 9.5" (1.765 m)   Wt 226 lb 12 oz (102.9 kg)   BMI 33.01 kg/m  , BMI Body mass index is 33.01 kg/m. GEN: Well nourished, well developed, in no acute distress.  HEENT: normal.  Neck: Supple, no JVD, carotid bruits, or masses. Cardiac: RRR, no murmurs, rubs, or gallops. No clubbing, cyanosis, edema.  Radials/DP/PT 2+ and equal bilaterally. Sternal incision site looks good without bleeding, erythema, or discharge. Medial Right upper calf incision site noted for  mild erythema but without discharge or bleeding. Site appears to be healing well. Respiratory:  Respirations regular and unlabored, clear to auscultation bilaterally. GI: Soft, nontender, nondistended, BS + x 4. Abdominal incision sites look good without bleeding, erythema, or discharge. MS: no deformity or atrophy. Skin: warm and dry, no rash. Neuro:  Strength and sensation are intact. Psych: Normal affect.  Accessory Clinical Findings    ECG - Regular sinus rhythm, first or AV block, left bundle branch block, prior inferior infarct.  Assessment & Plan    1.  Coronary artery disease: Status post recent 5 vessel coronary artery bypass grafting.  Overall, he has been doing well. He did have some dyspnea within a week of discharge, though this improved with 20 pound weight loss following 3 days of metolazone. He is now taking Lasix 20 mg every other day with a plan to stop after 7 total days. He has been weighing himself at home daily and his weight appears to have leveled off some around to 218-220. As his preoperative dry weight was approximately 234, and we discussed how he has established a new dry weight. He is euvolemic today and I suspect 220 is probably that weight.  He remains on  blocker, ASA 325, and statin therapy.  His BP is soft @ 100/62, thus there is no room for the addition of acei/arb/arni/spiro.  He is interested in enrolling in cardiac rehab.  2.  ICM/Chronic systolic CHF:  EF [id number].  He is now euvolemic on exam and remains on low dose  blocker therapy.  BP is soft, thus I don't think he'd tolerate switching to coreg or adding entresto/acei/arb/spiro.  We discussed the importance of daily weights, sodium restriction, medication compliance, and symptom reporting and he verbalizes understanding.  He will need a f/u echo in roughly 10 wks to reassess LV fxn following revascularization and determine candidacy for ICD therapy.  3.  HL:  LDL 49 in May. Cont crestor therapy.  4.   Essential HTN:  BP soft as above.  5.  Post-op Afib:  Remains on amiodarone for now. Quiescent.  6. Type 2 diabetes mellitus: A1c was 8.8 preop.  His sugars have been trending in the 90s to low 100s and he  is now using Lantus. He is very pleased with this and says he has never had his numbers look so good. He will continue to follow with primary care.  7. Disposition: Follow-up with Dr. Fletcher Anon in 2-3 months with plan for echo at follow-up.   Murray Hodgkins, NP 08/07/2016, 5:05 PM

## 2016-08-09 DIAGNOSIS — G4733 Obstructive sleep apnea (adult) (pediatric): Secondary | ICD-10-CM | POA: Diagnosis not present

## 2016-08-10 ENCOUNTER — Other Ambulatory Visit: Payer: Self-pay

## 2016-08-10 NOTE — Patient Outreach (Signed)
[facility]) Care Management  08/10/2016  PAO GHAN 1946-05-09 [id number]   Telephone call for transition of care call#3. Member was hospitalized for CABG on 07/14/16 and discharged on 07/23/16  Subjective: Member states that he is feeling much better and getting stronger every day.  States he is now sleeping good and his appetite is good.  States his weights have been stable and was 221 this morning.  States that when he saw the cardiology NP a few days ago they decided his dry weight goal would be 220.  Staets his blood sugars are the best they have ever been and are ranging from 74-130 in the morning.  States he did not feel low when his reading was 74.  States he is walking for 10 minutes 3 times a day.  States his bowels are moving about every other day now.  States he is to go see Dr. Prescott Gum on 08/16/16.    Assessment: Telephone call for transition of care call#3. Member was hospitalized for CABG on 07/14/16 and discharged on 07/23/16. Member denies any s/s of infection in his incisions. Member seen by cardiology on 08/07/16 and dry weight set at 220 Member reports stable B/P of 109/76 today and less swelling in legs.  Member is weighting daily and can verbalize when to call MD. Member has started  walking  and  is using his incentive spirometer. Member to see Dr. Prescott Gum on 08/16/16.    Plan: Plan to call member next week for follow up and plan to close Link to Wellness case and refer to [place].  [facility] CM Care Plan Problem One   Flowsheet Row Most Recent Value  Care Plan Problem One  Elevated blood sugars related to dx of Type 2 DM as evidenced by hemoglobin A1C of 7.8  Role Documenting the Problem One  Care Management Coordinator  Care Plan for Problem One  Active  [facility] Long Term Goal (31-90 days)  Member will decrease his hemoglobin A1C to 7.0 or below in the next 90 days  [facility] Long Term Goal Start Date  06/14/16 [Continue last  hemoglobin A1C  improved to 7.9%]  Interventions for Problem One Long Term Goal   Reviewed CHO counting and portion control, Instructed to notify MD if blood sugars are lower in the morning regularly as he may need to have his insulin decreased  [facility] CM Short Term Goal #1 (0-30 days)  Member will not be readmitted to hospital in the next 30 days  [facility] CM Short Term Goal #1 Start Date  07/24/16  Interventions for Short Term Goal #1  Reinforced to  member to slowly progress his activity, Encouraged to continue to use his incentive spirometer regularly,  Reinforced s/s of complications after surgery to call MD, Reinforced to weigh daily and when to call MD, Reinforced to continue to follow a heart healthy low CHO diet    Peter Garter RN, [facility] Care Management Coordinator-Link to [place] Management [phone]

## 2016-08-16 ENCOUNTER — Ambulatory Visit (INDEPENDENT_AMBULATORY_CARE_PROVIDER_SITE_OTHER): Payer: Self-pay | Admitting: Cardiothoracic Surgery

## 2016-08-16 ENCOUNTER — Encounter: Payer: Self-pay | Admitting: Cardiothoracic Surgery

## 2016-08-16 VITALS — BP 96/65 | HR 75 | Resp 20 | Ht 69.5 in | Wt 225.0 lb

## 2016-08-16 DIAGNOSIS — I2510 Atherosclerotic heart disease of native coronary artery without angina pectoris: Secondary | ICD-10-CM

## 2016-08-16 DIAGNOSIS — Z951 Presence of aortocoronary bypass graft: Secondary | ICD-10-CM

## 2016-08-16 NOTE — Progress Notes (Signed)
PCP is Eliezer Lofts, MD Referring Provider is Wellington Hampshire, MD  Chief Complaint  Patient presents with  . Routine Post Op    f/u post CABG done on 07/14/16    HPI: 1 month postop visit after multivessel CABG for ischemic cardiomyopathy, CHF, low EF. Patient had postop atrial fibrillation which has resolved with amiodarone. We will reduce the amiodarone dose to 200 mg daily. The patient is walking 30 minutes twice a day. He feels that his strength is improved. He has difficulty climbing 1.5 flights of stairs without shortness of breath  The patient had postop fluid retention and required a course of oral metolazone diuresis to help with his symptoms of postop heart failure. His Lasix has been off for about 5 days and he has started to gain weight, about 5 pounds. I believe he needs to stay on a maintenance dose of diuretic and have prescribed Demadex 20 mg daily his Lasix did not have much of an effect. His last BUN/creatinine 10 days ago were 25/1.5  Patient has diabetes. Patient would benefit from ACE inhibitor but with renal status is has not been started. Last chest x-ray was clear with blunting of the left costophrenic angle Past Medical History:  Diagnosis Date  . Allergy   . Arthritis   . Atrial flutter ([place])    a/ 05/2014 s/p RFCA.  Marland Kitchen CAD (coronary artery disease) 10/2007   a. 10/2007 s/p MI/PCI of RCA [facility] - Dr. Collene Mares);  b. 06/2016 MV: EF 34%, inf/inflat infarct; c. 07/2016 Cath: LM nl, LAd 90ost/prox, 25m, 99d, D2 30, RI small, LCX 147m, Om1 40, RCA 50ost, 5 ISR, 101m, 30d, RPDA  80ost, RPL2 70, EF 45-50%;  d. 07/2016 CABG x 5 (LIMA->LAD, VG->Diag, VG->RI, VG->PDA->RPL).  . Chronic systolic CHF (congestive heart failure) ([place])    a. 07/2016 Echo: Ef 30-35%.  . Diabetes mellitus without complication ([place])   . Extrinsic asthma, unspecified   . Glaucoma   . Gout   . Hyperlipidemia   . Hypertensive heart disease   . Ischemic cardiomyopathy    a. 07/2016 Echo: Ef 30-35%,  mildly dil LA, mild MR/TR, PASP 24 mmHg.  Marland Kitchen Post-op Afib following CABG (07/2016)    a. 07/2016 - placed on amio post-op.  Marland Kitchen Retinal vascular occlusion, unspecified    Right  . Sleep apnea     Past Surgical History:  Procedure Laterality Date  . ABLATION  05-07-2014   EPS and ablation of atrial flutter by Dr Caryl Comes  . APPENDECTOMY  1954  . ATRIAL FLUTTER ABLATION N/A 05/07/2014   Procedure: ATRIAL FLUTTER ABLATION;  Surgeon: Deboraha Sprang, MD;  Location: [facility] CATH LAB;  Service: Cardiovascular;  Laterality: N/A;  . CARDIAC CATHETERIZATION Left 07/10/2016   Procedure: Left Heart Cath and Coronary Angiography;  Surgeon: Wellington Hampshire, MD;  Location: [place] CV LAB;  Service: Cardiovascular;  Laterality: Left;  . COLONOSCOPY    . CORONARY ANGIOPLASTY  2009   stent [place]  . CORONARY ARTERY BYPASS GRAFT N/A 07/14/2016   Procedure: CORONARY ARTERY BYPASS GRAFTING (CABG), ON PUMP, TIMES FIVE, USING LEFT INTERNAL MAMMARY ARTERY, RIGHT GREATER SAPHENOUS VEIN HARVESTED ENDOSCOPICALLY;  Surgeon: Ivin Poot, MD;  Location: [place];  Service: Open Heart Surgery;  Laterality: N/A;  SVG to PD and PL sequential SVG to 1st diagonal SVG to [place] LIMA to LAD  . NECK SURGERY  1990's   x2, Fusion  . SHOULDER ARTHROSCOPY WITH SUBACROMIAL DECOMPRESSION Left 04/02/2014  Procedure: SHOULDER ARTHROSCOPY WITH SUBACROMIAL DECOMPRESSION WITH ROTATOR CUFF REPAIR;  Surgeon: Nita Sells, MD;  Location: [place];  Service: Orthopedics;  Laterality: Left;  Left shoulder rotator cuff repair, subacromial decompression  . SKIN SURGERY    . TEE WITHOUT CARDIOVERSION N/A 07/14/2016   Procedure: TRANSESOPHAGEAL ECHOCARDIOGRAM (TEE);  Surgeon: Ivin Poot, MD;  Location: [place];  Service: Open Heart Surgery;  Laterality: N/A;  . TONSILLECTOMY      Family History  Problem Relation Age of Onset  . Diabetes Sister   . Coronary artery disease Brother   . AAA (abdominal aortic  aneurysm) Father   . Heart disease Mother   . Diabetes Mother   . Diabetes Brother   . Colon cancer Neg Hx     Social History Social History  Substance Use Topics  . Smoking status: Former Smoker    Packs/day: 1.00    Years: 20.00    Types: Cigarettes    Quit date: 10/02/1988  . Smokeless tobacco: Never Used     Comment: quit in the 1990's  . Alcohol use 1.8 oz/week    3 Cans of beer per week     Comment: 3-4 drinks per week.     Current Outpatient Prescriptions  Medication Sig Dispense Refill  . amiodarone (PACERONE) 200 MG tablet Take 1 tablet (200 mg total) by mouth 2 (two) times daily. 60 tablet 1  . aspirin EC 325 MG EC tablet Take 1 tablet (325 mg total) by mouth daily. 30 tablet 0  . Cholecalciferol (VITAMIN D3) 1000 units CAPS Take 1,000 Units by mouth daily.     . fenofibrate 160 MG tablet TAKE 1 TABLET BY MOUTH DAILY (Patient taking differently: Take 160 mg by mouth daily) 90 tablet 1  . furosemide (LASIX) 40 MG tablet Take 40 mg by mouth every other day.    Marland Kitchen glucose blood (BAYER CONTOUR NEXT TEST) test strip Use to check blood sugar two times daily.  Dx: E11.9 100 each 12  . insulin glargine (LANTUS) 100 unit/mL SOPN Inject 0.2 mLs (20 Units total) into the skin 2 (two) times daily at 8 am and 10 pm. 15 mL 11  . Insulin Pen Needle (PEN NEEDLES 29GX1/2") 29G X 12MM MISC 1 Units by Does not apply route 2 (two) times daily at 8 am and 10 pm. Please use clean needle for every insulin adminstration 100 each 12  . latanoprost (XALATAN) 0.005 % ophthalmic solution Place 1 drop into the left eye at bedtime.   3  . metoprolol tartrate (LOPRESSOR) 25 MG tablet Take 1 tablet (25 mg total) by mouth 2 (two) times daily. (Patient taking differently: Take 12.5 mg by mouth 2 (two) times daily. ) 60 tablet 3  . modafinil (PROVIGIL) 200 MG tablet TAKE 1 TABLET BY MOUTH ONCE DAILY 90 tablet 3  . Omega-3 Fatty Acids (FISH OIL) 1000 MG CAPS Take 2,000 capsules by mouth 2 (two) times daily.      Marland Kitchen oxyCODONE (OXY IR/ROXICODONE) 5 MG immediate release tablet Take 1 tablet (5 mg total) by mouth every 4 (four) hours as needed for severe pain. 30 tablet 0  . rosuvastatin (CRESTOR) 10 MG tablet TAKE 1 TABLET BY MOUTH DAILY. (Patient taking differently: Take 10 mg by mouth daily) 90 tablet 1  . tamsulosin (FLOMAX) 0.4 MG CAPS capsule TAKE 1 CAPSULE BY MOUTH DAILY (Patient taking differently: Take 0.4 mg by mouth in the morning) 90 capsule 1   No current facility-administered  medications for this visit.     Allergies  Allergen Reactions  . No Known Allergies     Review of Systems: Leg edema has recurred despite using TED hose Eschar over right lower extremity incisions without evidence of cellulitis  BP 96/65   Pulse 75   Resp 20 Comment: on RA  Ht 5' 9.5" (1.765 m)   Wt 225 lb (102.1 kg)   SpO2 96%   BMI 32.75 kg/m  Physical Exam:      Exam    General- alert and comfortable   Lungs- clear without rales, wheezes   Cor- regular rate and rhythm, no murmur , gallop   Abdomen- soft, non-tender   Extremities - warm, non-tender, minimal edema   Neuro- oriented, appropriate, no focal weakness   Diagnostic Tests: No chest x-ray today  Impression: Patient is ready to start cardiac rehabilitation Patient will need maintenance dose of diuretic for his ischemic cardiomyopathy, heart failure  Plan: Return for review of progress in 4 weeks Patient is cleared to drive and to start cardiac rehabilitation and may lift up to 10-15 pounds. He will begin Demadex 20 mg by mouth daily  Len Childs, MD [facility] [phone]

## 2016-08-17 ENCOUNTER — Other Ambulatory Visit: Payer: Self-pay

## 2016-08-17 NOTE — Patient Outreach (Signed)
[place] [facility]) Care Management  [place]  08/17/2016   ARI ROSENDALE 1946-03-04 [id number]  Telephone call for transition of care call #4.  Member was hospitalized for CABG on 07/14/16 and discharged on 07/23/16  Subjective: Member states that he saw Dr. Darcey Nora yesterday and he put him back on his fluid pill for 14 days as his weight had gone up 4 lbs and he had more swelling in his leg.  States he has been walking 30-45 minutes a day.  States he has been OKd to start cardiac rehab.  States that his B/Ps have been good and it was 121/85 this AM.  States that his blood sugars have been very good with morning reading ranging from 73-113 and after meals around 120.    Assessment: Telephone call for transition of care call#3. Member was hospitalized for CABG on 07/14/16 and discharged on 07/23/16. Member denies any s/s of infection in his incisions. Member reports increased weight and restarted diuretic.   Member reports stable B/P of 121/85 today and less swelling in legs today. Member is weighting daily and can verbalize when to call MD. Member has started walking and isusinghis incentive spirometer. Member to saw Dr. Prescott Gum on 08/16/16 and has been referred to cardiac rehab.  CBGs have been lower in the morning and may need insulin decreased.    Plan:  Plan to call member next week for follow up and plan to close Link to Wellness case and refer to [place].  [facility] CM Care Plan Problem One   Flowsheet Row Most Recent Value  Care Plan Problem One  Elevated blood sugars related to dx of Type 2 DM as evidenced by hemoglobin A1C of 7.8  Role Documenting the Problem One  Care Management Coordinator  Care Plan for Problem One  Active  [facility] Long Term Goal (31-90 days)  Member will decrease his hemoglobin A1C to 7.0 or below in the next 90 days  [facility] Long Term Goal Start Date  06/14/16 [Continue last hemoglobin A1C  improved to 7.9%]   Interventions for Problem One Long Term Goal   Reviewed CHO counting and portion control, Reinforced to notify MD if blood sugars are lower in the morning regularly as he may need to have his insulin decreased  [facility] CM Short Term Goal #1 (0-30 days)  Member will not be readmitted to hospital in the next 30 days  [facility] CM Short Term Goal #1 Start Date  07/24/16  Interventions for Short Term Goal #1  Reinforced to  member to continue to progress his activity, Encouraged to continue to use his incentive spirometer regularly,  Reinforced s/s of complications after surgery to call MD, Reinforced to weigh daily and when to call MD, Reinforced to continue to follow a heart healthy low CHO diet, Instructed will call later next week to check on his status and close his Link to Wellness case and tranfer him to telephonic health coach    Peter Garter RN, [facility] Care Management Coordinator-Link to [place] Management [phone]

## 2016-08-21 ENCOUNTER — Telehealth: Payer: Self-pay

## 2016-08-21 MED ORDER — INSULIN PEN NEEDLE 32G X 4 MM MISC: 6 refills | Status: DC

## 2016-08-21 NOTE — Telephone Encounter (Signed)
Tommy Sullivan pts wife (DPR signed) said pts FBS has averaged 67-81 taking Lantus 20 units bid; pt sleeping a lot. 2 days ago pts wife changed dosage to Lantus 16 units bid and today FBS was 104. Pt is feeling fine today. Pt last seen 08/01/16 and has f/u scheduled on 11/07/16.pt wife request cb what to do about Lantus dosage. Also pt request the insulin pen needles changed to the nano 4 mm needle; pt weighs 220 lbs; spoke with pharmacist at [facility] and he said nano 4 mm would be OK for pt to use. rx sent to [facility] per pts wife request.

## 2016-08-21 NOTE — Telephone Encounter (Signed)
Enid Derry (DPR signed) left v/m; pt last seen taking 08/01/16; pt taking lantus 20 units bid as instructed. FBS has been low

## 2016-08-22 NOTE — Telephone Encounter (Signed)
Mrs. Tommy Sullivan notified by telephone to have Tommy Sullivan stay on the 16 units twice a day.  Medication list updated.

## 2016-08-22 NOTE — Telephone Encounter (Signed)
Okay to stay on lantus 16 units.  Change dose in med list please.

## 2016-08-25 ENCOUNTER — Other Ambulatory Visit: Payer: Self-pay

## 2016-08-25 DIAGNOSIS — Z794 Long term (current) use of insulin: Principal | ICD-10-CM

## 2016-08-25 DIAGNOSIS — E119 Type 2 diabetes mellitus without complications: Secondary | ICD-10-CM

## 2016-08-25 NOTE — Patient Outreach (Signed)
[place] [facility]) Care Management  [place]  08/25/2016   JONAVIN SEDER 1946/02/27 [id number]   Telephone call to close Link to Wellness case.  Member was hospitalized for CABG on 07/14/16 and discharged on 07/23/16  Subjective:  Member states that he he doing better and he is now walking 35-40 minutes twice a day.  States that he only gets SOB when he is going up stairs.  States he is to go to cardiac rehab on Monday for his first visit.  States they did call his MD about his lower blood sugars and his insulin has been lowered to 16 units twice a day.  States his blood sugars have been in the high 80's to 105 in the morning now.  States he still has some swelling in his leg but it is lower now.  States his weights have been stable and he weighted 218 this morning.  States that he is eating good and he is following a low sodium, low CHO diet.   Encounter Medications:  Outpatient Encounter Prescriptions as of 08/25/2016  Medication Sig  . amiodarone (PACERONE) 200 MG tablet Take 1 tablet (200 mg total) by mouth 2 (two) times daily.  Marland Kitchen aspirin EC 325 MG EC tablet Take 1 tablet (325 mg total) by mouth daily.  . Cholecalciferol (VITAMIN D3) 1000 units CAPS Take 1,000 Units by mouth daily.   . fenofibrate 160 MG tablet TAKE 1 TABLET BY MOUTH DAILY (Patient taking differently: Take 160 mg by mouth daily)  . furosemide (LASIX) 40 MG tablet Take 40 mg by mouth every other day.  Marland Kitchen glucose blood (BAYER CONTOUR NEXT TEST) test strip Use to check blood sugar two times daily.  Dx: E11.9  . insulin glargine (LANTUS) 100 UNIT/ML injection Inject 16 Units into the skin at bedtime.  . Insulin Pen Needle (BD PEN NEEDLE NANO U/F) 32G X 4 MM MISC Use to inject lantus twice a day and as instructed. Dx E11.9  . latanoprost (XALATAN) 0.005 % ophthalmic solution Place 1 drop into the left eye at bedtime.   . metoprolol tartrate (LOPRESSOR) 25 MG tablet Take 1 tablet (25 mg total) by mouth  2 (two) times daily. (Patient taking differently: Take 12.5 mg by mouth 2 (two) times daily. )  . modafinil (PROVIGIL) 200 MG tablet TAKE 1 TABLET BY MOUTH ONCE DAILY  . Omega-3 Fatty Acids (FISH OIL) 1000 MG CAPS Take 2,000 capsules by mouth 2 (two) times daily.   Marland Kitchen oxyCODONE (OXY IR/ROXICODONE) 5 MG immediate release tablet Take 1 tablet (5 mg total) by mouth every 4 (four) hours as needed for severe pain.  . rosuvastatin (CRESTOR) 10 MG tablet TAKE 1 TABLET BY MOUTH DAILY. (Patient taking differently: Take 10 mg by mouth daily)  . tamsulosin (FLOMAX) 0.4 MG CAPS capsule TAKE 1 CAPSULE BY MOUTH DAILY (Patient taking differently: Take 0.4 mg by mouth in the morning)   No facility-administered encounter medications on file as of 08/25/2016.     Assessment: Telephone call to close Link to Wellness case.  Member was hospitalized for CABG on 07/14/16 and discharged on 07/23/16.  Member was hospitalized for CABG on 07/14/16 and discharged on 07/23/16. Member denies any s/s of infection in his incisions. Member reports decreased weight after restarting diuretic. Member is weighting daily and can verbalize when to call MD. Member has started walking and isusinghis incentive spirometer. Member to see  cardiac rehab on 08/28/16. Member reported lower blood sugars to MD and had  his Lantus insulin lowered to 16 units twice a day.  Reports improved CBGs of 80s-105 fasting.    Plan:  Plan to close Link to Wellness case as he is no longer eligible for Link to Wellness services and refer to [place] with his Health team [employer] coverage.  [facility] CM Care Plan Problem One   Flowsheet Row Most Recent Value  Care Plan Problem One  Elevated blood sugars related to dx of Type 2 DM as evidenced by hemoglobin A1C of 7.8  Role Documenting the Problem One  Care Management Coordinator  Care Plan for Problem One  Active  [facility] Long Term Goal (31-90 days)  Member will decrease his hemoglobin  A1C to 7.0 or below in the next 90 days  [facility] Long Term Goal Start Date  06/14/16  [facility] Long Term Goal Met Date  08/25/16  Interventions for Problem One Long Term Goal   Reviewed CHO counting and portion control, Prasied for notifying MD of lower blood sugars and getting his insulin dosage lowered, Instructed to notify MD if he starts to get elevated readings or if he has more hypoglycemia  [facility] CM Short Term Goal #1 (0-30 days)  Member will not be readmitted to hospital in the next 30 days  [facility] CM Short Term Goal #1 Start Date  07/24/16  [facility] CM Short Term Goal #1 Met Date  08/25/16  Interventions for Short Term Goal #1  Reinforced to  member to continue to progress his activity, Instructed to go to cardiac rehab as scheduled,  Reinforced s/s of complications after surgery to call MD, Reinforced to weigh daily and when to call MD, Reinforced to continue to follow a heart healthy low CHO diet, Instructed his case with Link to Wellness will be closed  and he will be tranfered to telephonic health coach     Peter Garter RN, [facility] Care Management Coordinator-Link to [place] Management [phone]

## 2016-08-28 ENCOUNTER — Encounter: Payer: PPO | Attending: Cardiovascular Disease | Admitting: *Deleted

## 2016-08-28 VITALS — Ht 70.25 in | Wt 223.0 lb

## 2016-08-28 DIAGNOSIS — Z951 Presence of aortocoronary bypass graft: Secondary | ICD-10-CM | POA: Diagnosis not present

## 2016-08-28 NOTE — Progress Notes (Signed)
Cardiac Individual Treatment Plan  Patient Details  Name: Tommy Sullivan MRN: [id number] Date of Birth: December 13, 1945 Referring Provider:   Flowsheet Row Cardiac Rehab from 08/28/2016 in [facility] Cardiac and Pulmonary Rehab  Referring Provider  Kathlyn Sacramento MD      Initial Encounter Date:  Flowsheet Row Cardiac Rehab from 08/28/2016 in [facility] Cardiac and Pulmonary Rehab  Date  08/28/16  Referring Provider  Kathlyn Sacramento MD      Visit Diagnosis: S/P CABG x 5  Patient's Home Medications on Admission:  Current Outpatient Prescriptions:  .  amiodarone (PACERONE) 200 MG tablet, Take 1 tablet (200 mg total) by mouth 2 (two) times daily., Disp: 60 tablet, Rfl: 1 .  aspirin EC 325 MG EC tablet, Take 1 tablet (325 mg total) by mouth daily., Disp: 30 tablet, Rfl: 0 .  Cholecalciferol (VITAMIN D3) 1000 units CAPS, Take 1,000 Units by mouth daily. , Disp: , Rfl:  .  fenofibrate 160 MG tablet, TAKE 1 TABLET BY MOUTH DAILY (Patient taking differently: Take 160 mg by mouth daily), Disp: 90 tablet, Rfl: 1 .  glucose blood (BAYER CONTOUR NEXT TEST) test strip, Use to check blood sugar two times daily.  Dx: E11.9, Disp: 100 each, Rfl: 12 .  insulin glargine (LANTUS) 100 UNIT/ML injection, Inject 16 Units into the skin at bedtime., Disp: , Rfl:  .  Insulin Pen Needle (BD PEN NEEDLE NANO U/F) 32G X 4 MM MISC, Use to inject lantus twice a day and as instructed. Dx E11.9, Disp: 100 each, Rfl: 6 .  latanoprost (XALATAN) 0.005 % ophthalmic solution, Place 1 drop into the left eye at bedtime. , Disp: , Rfl: 3 .  metoprolol tartrate (LOPRESSOR) 25 MG tablet, Take 1 tablet (25 mg total) by mouth 2 (two) times daily. (Patient taking differently: Take 12.5 mg by mouth 2 (two) times daily. ), Disp: 60 tablet, Rfl: 3 .  modafinil (PROVIGIL) 200 MG tablet, TAKE 1 TABLET BY MOUTH ONCE DAILY, Disp: 90 tablet, Rfl: 3 .  Omega-3 Fatty Acids (FISH OIL) 1000 MG CAPS, Take 2,000 capsules by mouth 2 (two) times daily. ,  Disp: , Rfl:  .  oxyCODONE (OXY IR/ROXICODONE) 5 MG immediate release tablet, Take 1 tablet (5 mg total) by mouth every 4 (four) hours as needed for severe pain., Disp: 30 tablet, Rfl: 0 .  rosuvastatin (CRESTOR) 10 MG tablet, TAKE 1 TABLET BY MOUTH DAILY. (Patient taking differently: Take 10 mg by mouth daily), Disp: 90 tablet, Rfl: 1 .  tamsulosin (FLOMAX) 0.4 MG CAPS capsule, TAKE 1 CAPSULE BY MOUTH DAILY (Patient taking differently: Take 0.4 mg by mouth in the morning), Disp: 90 capsule, Rfl: 1 .  torsemide (DEMADEX) 20 MG tablet, Take 20 mg by mouth daily., Disp: , Rfl:  .  furosemide (LASIX) 40 MG tablet, Take 40 mg by mouth every other day., Disp: , Rfl:   Past Medical History: Past Medical History:  Diagnosis Date  . Allergy   . Arthritis   . Atrial flutter ([place])    a/ 05/2014 s/p RFCA.  Marland Kitchen CAD (coronary artery disease) 10/2007   a. 10/2007 s/p MI/PCI of RCA [facility] - Dr. Collene Mares);  b. 06/2016 MV: EF 34%, inf/inflat infarct; c. 07/2016 Cath: LM nl, LAd 90ost/prox, 49m, 99d, D2 30, RI small, LCX 195m, Om1 40, RCA 50ost, 5 ISR, 81m, 30d, RPDA  80ost, RPL2 70, EF 45-50%;  d. 07/2016 CABG x 5 (LIMA->LAD, VG->Diag, VG->RI, VG->PDA->RPL).  . Chronic systolic CHF (congestive heart failure) ([place])  a. 07/2016 Echo: Ef 30-35%.  . Diabetes mellitus without complication ([place])   . Extrinsic asthma, unspecified   . Glaucoma   . Gout   . Hyperlipidemia   . Hypertensive heart disease   . Ischemic cardiomyopathy    a. 07/2016 Echo: Ef 30-35%, mildly dil LA, mild MR/TR, PASP 24 mmHg.  Marland Kitchen Post-op Afib following CABG (07/2016)    a. 07/2016 - placed on amio post-op.  Marland Kitchen Retinal vascular occlusion, unspecified    Right  . Sleep apnea     Tobacco Use: History  Smoking Status  . Former Smoker  . Packs/day: 1.00  . Years: 20.00  . Types: Cigarettes  . Quit date: 10/02/1988  Smokeless Tobacco  . Never Used    Comment: quit in the 1990's    Labs: Recent Review Flowsheet Data    Labs for ITP  Cardiac and Pulmonary Rehab Latest Ref Rng & Units 07/14/2016 07/14/2016 07/15/2016 07/16/2016 07/16/2016   Cholestrol 0 - 200 mg/dL - - - - -   LDLCALC 0 - 99 mg/dL - - - - -   LDLDIRECT mg/dL - - - - -   HDL >39.00 mg/dL - - - - -   Trlycerides 0.0 - 149.0 mg/dL - - - - -   Hemoglobin A1c 4.8 - 5.6 % - - - - -   PHART 7.350 - 7.450 7.386 7.391 - - 7.346(L)   PCO2ART 32.0 - 48.0 mmHg 35.3 36.8 - - 46.5   HCO3 20.0 - 28.0 mmol/L 21.1 22.3 - - 25.4   TCO2 0 - 100 mmol/L 22 23 25  - 27   ACIDBASEDEF 0.0 - 2.0 mmol/L 3.0(H) 2.0 - - 1.0   O2SAT % 94.0 94.0 - 51.6 94.0       Exercise Target Goals: Date: 08/28/16  Exercise Program Goal: Individual exercise prescription set with THRR, safety & activity barriers. Participant demonstrates ability to understand and report RPE using BORG scale, to self-measure pulse accurately, and to acknowledge the importance of the exercise prescription.  Exercise Prescription Goal: Starting with aerobic activity 30 plus minutes a day, 3 days per week for initial exercise prescription. Provide home exercise prescription and guidelines that participant acknowledges understanding prior to discharge.  Activity Barriers & Risk Stratification:     Activity Barriers & Cardiac Risk Stratification - 08/28/16 1330      Activity Barriers & Cardiac Risk Stratification   Activity Barriers Shortness of Breath;Deconditioning;Muscular Weakness;Joint Problems  Shortness of breat occurs only when climbing stairs.  Has been exercising on the treadmill low speed no elevation and can maintain 30 + minutes without symptoms.; shoulder injury from fall 3 years ago, occasional pain   Cardiac Risk Stratification High      6 Minute Walk:     6 Minute Walk    Row Name 08/28/16 1403         6 Minute Walk   Phase Initial     Distance 1032 feet     Walk Time 5.78 minutes  sat down at 5:47     # of Rest Breaks 1     MPH 2.03     METS 2.53     RPE 17     Perceived  Dyspnea  3     VO2 Peak 7.47     Symptoms Yes (comment)     Comments SOB, tightness/fatigue in thighs     Resting HR 85 bpm     Resting BP 106/66     Max  Ex. HR 99 bpm     Max Ex. BP 122/64     2 Minute Post BP 106/54        Initial Exercise Prescription:     Initial Exercise Prescription - 08/28/16 1400      Date of Initial Exercise RX and Referring Provider   Date 08/28/16   Referring Provider Kathlyn Sacramento MD     Treadmill   MPH 1.5   Grade 0   Minutes 15   METs 2.15     NuStep   Level 1   Watts --  60-80 spm   Minutes 15   METs 2     Recumbant Elliptical   Level 1   RPM 50   Minutes 15   METs 2     Prescription Details   Frequency (times per week) 2   Duration Progress to 45 minutes of aerobic exercise without signs/symptoms of physical distress     Intensity   THRR 40-80% of Max Heartrate 111-137   Ratings of Perceived Exertion 11-15   Perceived Dyspnea 0-4     Progression   Progression Continue to progress workloads to maintain intensity without signs/symptoms of physical distress.     Resistance Training   Training Prescription Yes   Weight 3 lbs   Reps 10-12      Perform Capillary Blood Glucose checks as needed.  Exercise Prescription Changes:     Exercise Prescription Changes    Row Name 08/28/16 1300             Exercise Review   Progression -  walk test results         Response to Exercise   Blood Pressure (Admit) 106/66       Blood Pressure (Exercise) 122/64       Blood Pressure (Exit) 106/54       Heart Rate (Admit) 85 bpm       Heart Rate (Exercise) 99 bpm       Heart Rate (Exit) 75 bpm       Oxygen Saturation (Admit) 99 %       Oxygen Saturation (Exercise) 94 %       Rating of Perceived Exertion (Exercise) 17       Symptoms SOB, tightness/fatigue in legs          Exercise Comments:     Exercise Comments    Row Name 08/28/16 1409           Exercise Comments Exercise goal is to get up and down stairs  without SOB.          Discharge Exercise Prescription (Final Exercise Prescription Changes):     Exercise Prescription Changes - 08/28/16 1300      Exercise Review   Progression --  walk test results     Response to Exercise   Blood Pressure (Admit) 106/66   Blood Pressure (Exercise) 122/64   Blood Pressure (Exit) 106/54   Heart Rate (Admit) 85 bpm   Heart Rate (Exercise) 99 bpm   Heart Rate (Exit) 75 bpm   Oxygen Saturation (Admit) 99 %   Oxygen Saturation (Exercise) 94 %   Rating of Perceived Exertion (Exercise) 17   Symptoms SOB, tightness/fatigue in legs      Nutrition:  Target Goals: Understanding of nutrition guidelines, daily intake of sodium 1500mg , cholesterol 200mg , calories 30% from fat and 7% or less from saturated fats, daily to have 5 or more servings of fruits and vegetables.  Biometrics:  Pre Biometrics - 08/28/16 1411      Pre Biometrics   Height 5' 10.25" (1.784 m)   Weight 223 lb (101.2 kg)   Waist Circumference 40.5 inches   Hip Circumference 42.5 inches   Waist to Hip Ratio 0.95 %   BMI (Calculated) 31.8   Single Leg Stand 6.54 seconds       Nutrition Therapy Plan and Nutrition Goals:     Nutrition Therapy & Goals - 08/28/16 1319      Intervention Plan   Intervention Prescribe, educate and counsel regarding individualized specific dietary modifications aiming towards targeted core components such as weight, hypertension, lipid management, diabetes, heart failure and other comorbidities.   Expected Outcomes Short Term Goal: Understand basic principles of dietary content, such as calories, fat, sodium, cholesterol and nutrients.;Short Term Goal: A plan has been developed with personal nutrition goals set during dietitian appointment.;Long Term Goal: Adherence to prescribed nutrition plan.      Nutrition Discharge: Rate Your Plate Scores:     Nutrition Assessments - 08/28/16 1320      Rate Your Plate Scores   Pre Score 76   Pre  Score % 87.4 %      Nutrition Goals Re-Evaluation:   Psychosocial: Target Goals: Acknowledge presence or absence of depression, maximize coping skills, provide positive support system. Participant is able to verbalize types and ability to use techniques and skills needed for reducing stress and depression.  Initial Review & Psychosocial Screening:     Initial Psych Review & Screening - 08/28/16 1327      Initial Review   Current issues with --  none reported     Family Dynamics   Good Support System? Yes  Wife     Barriers   Psychosocial barriers to participate in program There are no identifiable barriers or psychosocial needs.;The patient should benefit from training in stress management and relaxation.     Screening Interventions   Interventions Encouraged to exercise      Quality of Life Scores:     Quality of Life - 08/28/16 1328      Quality of Life Scores   Health/Function Pre 22.71 %   Socioeconomic Pre 28.29 %   Psych/Spiritual Pre 29.14 %   Family Pre 27 %   GLOBAL Pre 25.88 %      PHQ-9: Recent Review Flowsheet Data    Depression screen [facility] 2/9 08/28/2016 06/14/2016 04/27/2016 03/15/2016 12/01/2015   Decreased Interest 1 0 0 0 0   Down, Depressed, Hopeless 0 0 0 0 0   PHQ - 2 Score 1 0 0 0 0   Altered sleeping 1 - - - -   Tired, decreased energy 1 - - - -   Change in appetite 0 - - - -   Feeling bad or failure about yourself  0 - - - -   Trouble concentrating 0 - - - -   Moving slowly or fidgety/restless 0 - - - -   Suicidal thoughts 0 - - - -   PHQ-9 Score 3 - - - -   Difficult doing work/chores Not difficult at all - - - -      Psychosocial Evaluation and Intervention:   Psychosocial Re-Evaluation:   Vocational Rehabilitation: Provide vocational rehab assistance to qualifying candidates.   Vocational Rehab Evaluation & Intervention:     Vocational Rehab - 08/28/16 1332      Initial Vocational Rehab Evaluation & Intervention    Assessment shows need for  Vocational Rehabilitation No      Education: Education Goals: Education classes will be provided on a weekly basis, covering required topics. Participant will state understanding/return demonstration of topics presented.  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 Gives an explanation and review of dietary fats and fiber.   Controlling Sodium/Reading Food Labels: -Group verbal and written material supporting the discussion of sodium use in heart healthy nutrition. Review and explanation with models, verbal and written materials for utilization of the food label.   Exercise Physiology & Risk Factors: - Group verbal and written instruction with models to review the exercise physiology of the cardiovascular system and associated critical values. Details cardiovascular disease risk factors and the goals associated with each risk factor.   Aerobic Exercise & Resistance Training: - Gives group verbal and written discussion on the health impact of inactivity. On the components of aerobic and resistive training programs and the benefits of this training and how to safely progress through these programs.   Flexibility, Balance, General Exercise Guidelines: - Provides group verbal and written instruction on the benefits of flexibility and balance training programs. Provides general exercise guidelines with specific guidelines to those with heart or lung disease. Demonstration and skill practice provided.   Stress Management: - Provides group verbal and written instruction about the  health risks of elevated stress, cause of high stress, and healthy ways to reduce stress.   Depression: - Provides group verbal and written instruction on the correlation between heart/lung disease and depressed mood, treatment options, and the stigmas associated with seeking treatment.   Anatomy & Physiology of the Heart: - Group verbal and written instruction and models provide basic cardiac anatomy and physiology, with the coronary electrical and arterial systems. Review of: AMI, Angina, Valve disease, Heart Failure, Cardiac Arrhythmia, Pacemakers, and the ICD.   Cardiac Procedures: - Group verbal and written instruction and models to describe the testing methods done to diagnose heart disease. Reviews the outcomes of the test results. Describes the treatment choices: Medical Management, Angioplasty, or Coronary Bypass Surgery.   Cardiac Medications: - Group verbal and written instruction to review commonly prescribed medications for heart disease. Reviews the medication, class of the drug, and side effects. Includes the steps to properly store meds and maintain the prescription regimen.   Go Sex-Intimacy & Heart Disease, Get SMART - Goal Setting: - Group verbal and written instruction through game format to discuss heart disease and the return to sexual intimacy. Provides group verbal and written material to discuss and apply goal setting through the application of the S.M.A.R.T. Method.   Other Matters of the Heart: - Provides group verbal, written materials and models to describe Heart Failure, Angina, Valve Disease, and Diabetes in the realm of heart disease. Includes description of the disease process and treatment options available to the cardiac patient.   Exercise & Equipment Safety: - Individual verbal instruction and demonstration of equipment use and safety with use of the equipment. Flowsheet Row Cardiac Rehab from 08/28/2016 in [facility] Cardiac and Pulmonary Rehab  Date   08/28/16  Educator  SB  Instruction Review Code  2- meets goals/outcomes  Infection Prevention: - Provides verbal and written material to individual with discussion of infection control including proper hand washing and proper equipment cleaning during exercise session. Flowsheet Row Cardiac Rehab from 08/28/2016 in [facility] Cardiac and Pulmonary Rehab  Date  08/28/16  Educator  SB  Instruction Review Code  2- meets goals/outcomes      Falls Prevention: - Provides verbal and written material to individual with discussion of falls prevention and safety. Flowsheet Row Cardiac Rehab from 08/28/2016 in [facility] Cardiac and Pulmonary Rehab  Date  08/28/16  Educator  SB  Instruction Review Code  2- meets goals/outcomes      Diabetes: - Individual verbal and written instruction to review signs/symptoms of diabetes, desired ranges of glucose level fasting, after meals and with exercise. Advice that pre and post exercise glucose checks will be done for 3 sessions at entry of program. Flowsheet Row Cardiac Rehab from 08/28/2016 in [facility] Cardiac and Pulmonary Rehab  Date  08/28/16  Educator  SB  Instruction Review Code  2- meets goals/outcomes       Knowledge Questionnaire Score:     Knowledge Questionnaire Score - 08/28/16 1332      Knowledge Questionnaire Score   Pre Score 18/28  Reviewed with Legrand Como the results and answers      Core Components/Risk Factors/Patient Goals at Admission:     Personal Goals and Risk Factors at Admission - 08/28/16 1320      Core Components/Risk Factors/Patient Goals on Admission    Weight Management Yes;Obesity   Intervention Weight Management: Develop a combined nutrition and exercise program designed to reach desired caloric intake, while maintaining appropriate intake of nutrient and fiber, sodium and fats, and appropriate energy expenditure required for the weight goal.   Admit Weight 223 lb (101.2 kg)   Goal Weight: Short Term 218 lb (98.9  kg)   Goal Weight: Long Term 200 lb (90.7 kg)   Expected Outcomes Short Term: Continue to assess and modify interventions until short term weight is achieved;Long Term: Adherence to nutrition and physical activity/exercise program aimed toward attainment of established weight goal;Weight Maintenance: Understanding of the daily nutrition guidelines, which includes 25-35% calories from fat, 7% or less cal from saturated fats, less than 200mg  cholesterol, less than 1.5gm of sodium, & 5 or more servings of fruits and vegetables daily   Sedentary Yes   Intervention Provide advice, education, support and counseling about physical activity/exercise needs.;Develop an individualized exercise prescription for aerobic and resistive training based on initial evaluation findings, risk stratification, comorbidities and participant's personal goals.   Expected Outcomes Achievement of increased cardiorespiratory fitness and enhanced flexibility, muscular endurance and strength shown through measurements of functional capacity and personal statement of participant.   Increase Strength and Stamina Yes  Able to climb stairs without SOB   Intervention Provide advice, education, support and counseling about physical activity/exercise needs.;Develop an individualized exercise prescription for aerobic and resistive training based on initial evaluation findings, risk stratification, comorbidities and participant's personal goals.   Expected Outcomes Achievement of increased cardiorespiratory fitness and enhanced flexibility, muscular endurance and strength shown through measurements of functional capacity and personal statement of participant.   Diabetes Yes   Intervention Provide education about signs/symptoms and action to take for hypo/hyperglycemia.;Provide education about proper nutrition, including hydration, and aerobic/resistive exercise prescription along with prescribed medications to achieve blood glucose in normal  ranges: Fasting glucose 65-99 mg/dL   Expected Outcomes Short Term: Participant verbalizes understanding of the signs/symptoms and immediate care of hyper/hypoglycemia, proper  foot care and importance of medication, aerobic/resistive exercise and nutrition plan for blood glucose control.;Long Term: Attainment of HbA1C < 7%.   Heart Failure Yes   Intervention Provide a combined exercise and nutrition program that is supplemented with education, support and counseling about heart failure. Directed toward relieving symptoms such as shortness of breath, decreased exercise tolerance, and extremity edema.   Expected Outcomes Improve functional capacity of life;Short term: Attendance in program 2-3 days a week with increased exercise capacity. Reported lower sodium intake. Reported increased fruit and vegetable intake. Reports medication compliance.;Short term: Daily weights obtained and reported for increase. Utilizing diuretic protocols set by physician.;Long term: Adoption of self-care skills and reduction of barriers for early signs and symptoms recognition and intervention leading to self-care maintenance.   Hypertension Yes   Intervention Provide education on lifestyle modifcations including regular physical activity/exercise, weight management, moderate sodium restriction and increased consumption of fresh fruit, vegetables, and low fat dairy, alcohol moderation, and smoking cessation.;Monitor prescription use compliance.   Expected Outcomes Short Term: Continued assessment and intervention until BP is < 140/59mm HG in hypertensive participants. < 130/92mm HG in hypertensive participants with diabetes, heart failure or chronic kidney disease.;Long Term: Maintenance of blood pressure at goal levels.   Lipids Yes   Intervention Provide education and support for participant on nutrition & aerobic/resistive exercise along with prescribed medications to achieve LDL 70mg , HDL >40mg .   Expected Outcomes Short  Term: Participant states understanding of desired cholesterol values and is compliant with medications prescribed. Participant is following exercise prescription and nutrition guidelines.;Long Term: Cholesterol controlled with medications as prescribed, with individualized exercise RX and with personalized nutrition plan. Value goals: LDL < 70mg , HDL > 40 mg.      Core Components/Risk Factors/Patient Goals Review:    Core Components/Risk Factors/Patient Goals at Discharge (Final Review):    ITP Comments:     ITP Comments    Row Name 08/28/16 1313           ITP Comments Med review completed. Initial ITP created. Diagnosis documentation can be found in [facility] Encounter 07/10/2016          Comments: Initial ITP

## 2016-08-28 NOTE — Patient Instructions (Signed)
Patient Instructions  Patient Details  Name: Tommy Sullivan MRN: [id number] Date of Birth: 1946/06/20 Referring Provider:  Wellington Hampshire, MD  Below are the personal goals you chose as well as exercise and nutrition goals. Our goal is to help you keep on track towards obtaining and maintaining your goals. We will be discussing your progress on these goals with you throughout the program.  Initial Exercise Prescription:     Initial Exercise Prescription - 08/28/16 1400      Date of Initial Exercise RX and Referring Provider   Date 08/28/16   Referring Provider Kathlyn Sacramento MD     Treadmill   MPH 1.5   Grade 0   Minutes 15   METs 2.15     NuStep   Level 1   Watts --  60-80 spm   Minutes 15   METs 2     Recumbant Elliptical   Level 1   RPM 50   Minutes 15   METs 2     Prescription Details   Frequency (times per week) 2   Duration Progress to 45 minutes of aerobic exercise without signs/symptoms of physical distress     Intensity   THRR 40-80% of Max Heartrate 111-137   Ratings of Perceived Exertion 11-15   Perceived Dyspnea 0-4     Progression   Progression Continue to progress workloads to maintain intensity without signs/symptoms of physical distress.     Resistance Training   Training Prescription Yes   Weight 3 lbs   Reps 10-12      Exercise Goals: Frequency: Be able to perform aerobic exercise three times per week working toward 3-5 days per week.  Intensity: Work with a perceived exertion of 11 (fairly light) - 15 (hard) as tolerated. Follow your new exercise prescription and watch for changes in prescription as you progress with the program. Changes will be reviewed with you when they are made.  Duration: You should be able to do 30 minutes of continuous aerobic exercise in addition to a 5 minute warm-up and a 5 minute cool-down routine.  Nutrition Goals: Your personal nutrition goals will be established when you do your nutrition analysis  with the dietician.  The following are nutrition guidelines to follow: Cholesterol < 200mg /day Sodium < 1500mg /day Fiber: Men over 50 yrs - 30 grams per day  Personal Goals:     Personal Goals and Risk Factors at Admission - 08/28/16 1320      Core Components/Risk Factors/Patient Goals on Admission    Weight Management Yes;Obesity   Intervention Weight Management: Develop a combined nutrition and exercise program designed to reach desired caloric intake, while maintaining appropriate intake of nutrient and fiber, sodium and fats, and appropriate energy expenditure required for the weight goal.   Admit Weight 223 lb (101.2 kg)   Goal Weight: Short Term 218 lb (98.9 kg)   Goal Weight: Long Term 200 lb (90.7 kg)   Expected Outcomes Short Term: Continue to assess and modify interventions until short term weight is achieved;Long Term: Adherence to nutrition and physical activity/exercise program aimed toward attainment of established weight goal;Weight Maintenance: Understanding of the daily nutrition guidelines, which includes 25-35% calories from fat, 7% or less cal from saturated fats, less than 200mg  cholesterol, less than 1.5gm of sodium, & 5 or more servings of fruits and vegetables daily   Sedentary Yes   Intervention Provide advice, education, support and counseling about physical activity/exercise needs.;Develop an individualized exercise prescription for aerobic and resistive  training based on initial evaluation findings, risk stratification, comorbidities and participant's personal goals.   Expected Outcomes Achievement of increased cardiorespiratory fitness and enhanced flexibility, muscular endurance and strength shown through measurements of functional capacity and personal statement of participant.   Increase Strength and Stamina Yes  Able to climb stairs without SOB   Intervention Provide advice, education, support and counseling about physical activity/exercise needs.;Develop an  individualized exercise prescription for aerobic and resistive training based on initial evaluation findings, risk stratification, comorbidities and participant's personal goals.   Expected Outcomes Achievement of increased cardiorespiratory fitness and enhanced flexibility, muscular endurance and strength shown through measurements of functional capacity and personal statement of participant.   Diabetes Yes   Intervention Provide education about signs/symptoms and action to take for hypo/hyperglycemia.;Provide education about proper nutrition, including hydration, and aerobic/resistive exercise prescription along with prescribed medications to achieve blood glucose in normal ranges: Fasting glucose 65-99 mg/dL   Expected Outcomes Short Term: Participant verbalizes understanding of the signs/symptoms and immediate care of hyper/hypoglycemia, proper foot care and importance of medication, aerobic/resistive exercise and nutrition plan for blood glucose control.;Long Term: Attainment of HbA1C < 7%.   Heart Failure Yes   Intervention Provide a combined exercise and nutrition program that is supplemented with education, support and counseling about heart failure. Directed toward relieving symptoms such as shortness of breath, decreased exercise tolerance, and extremity edema.   Expected Outcomes Improve functional capacity of life;Short term: Attendance in program 2-3 days a week with increased exercise capacity. Reported lower sodium intake. Reported increased fruit and vegetable intake. Reports medication compliance.;Short term: Daily weights obtained and reported for increase. Utilizing diuretic protocols set by physician.;Long term: Adoption of self-care skills and reduction of barriers for early signs and symptoms recognition and intervention leading to self-care maintenance.   Hypertension Yes   Intervention Provide education on lifestyle modifcations including regular physical activity/exercise, weight  management, moderate sodium restriction and increased consumption of fresh fruit, vegetables, and low fat dairy, alcohol moderation, and smoking cessation.;Monitor prescription use compliance.   Expected Outcomes Short Term: Continued assessment and intervention until BP is < 140/46mm HG in hypertensive participants. < 130/61mm HG in hypertensive participants with diabetes, heart failure or chronic kidney disease.;Long Term: Maintenance of blood pressure at goal levels.   Lipids Yes   Intervention Provide education and support for participant on nutrition & aerobic/resistive exercise along with prescribed medications to achieve LDL 70mg , HDL >40mg .   Expected Outcomes Short Term: Participant states understanding of desired cholesterol values and is compliant with medications prescribed. Participant is following exercise prescription and nutrition guidelines.;Long Term: Cholesterol controlled with medications as prescribed, with individualized exercise RX and with personalized nutrition plan. Value goals: LDL < 70mg , HDL > 40 mg.      Tobacco Use Initial Evaluation: History  Smoking Status  . Former Smoker  . Packs/day: 1.00  . Years: 20.00  . Types: Cigarettes  . Quit date: 10/02/1988  Smokeless Tobacco  . Never Used    Comment: quit in the 1990's    Copy of goals given to participant.

## 2016-08-29 ENCOUNTER — Encounter: Payer: Self-pay | Admitting: *Deleted

## 2016-09-05 ENCOUNTER — Ambulatory Visit (INDEPENDENT_AMBULATORY_CARE_PROVIDER_SITE_OTHER): Payer: PPO

## 2016-09-05 DIAGNOSIS — Z23 Encounter for immunization: Secondary | ICD-10-CM | POA: Diagnosis not present

## 2016-09-06 ENCOUNTER — Encounter: Payer: Self-pay | Admitting: *Deleted

## 2016-09-06 DIAGNOSIS — Z951 Presence of aortocoronary bypass graft: Secondary | ICD-10-CM

## 2016-09-06 NOTE — Progress Notes (Signed)
Cardiac Individual Treatment Plan  Patient Details  Name: Tommy Sullivan MRN: [id number] Date of Birth: [birth year] Referring Provider:   Flowsheet Row Cardiac Rehab from 08/28/2016 in [facility] Cardiac and Pulmonary Rehab  Referring Provider  Kathlyn Sacramento MD      Initial Encounter Date:  Flowsheet Row Cardiac Rehab from 08/28/2016 in [facility] Cardiac and Pulmonary Rehab  Date  08/28/16  Referring Provider  Kathlyn Sacramento MD      Visit Diagnosis: S/P CABG x 5  Patient's Home Medications on Admission:  Current Outpatient Prescriptions:  .  amiodarone (PACERONE) 200 MG tablet, Take 1 tablet (200 mg total) by mouth 2 (two) times daily., Disp: 60 tablet, Rfl: 1 .  aspirin EC 325 MG EC tablet, Take 1 tablet (325 mg total) by mouth daily., Disp: 30 tablet, Rfl: 0 .  Cholecalciferol (VITAMIN D3) 1000 units CAPS, Take 1,000 Units by mouth daily. , Disp: , Rfl:  .  fenofibrate 160 MG tablet, TAKE 1 TABLET BY MOUTH DAILY (Patient taking differently: Take 160 mg by mouth daily), Disp: 90 tablet, Rfl: 1 .  furosemide (LASIX) 40 MG tablet, Take 40 mg by mouth every other day., Disp: , Rfl:  .  glucose blood (BAYER CONTOUR NEXT TEST) test strip, Use to check blood sugar two times daily.  Dx: E11.9, Disp: 100 each, Rfl: 12 .  insulin glargine (LANTUS) 100 UNIT/ML injection, Inject 16 Units into the skin at bedtime., Disp: , Rfl:  .  Insulin Pen Needle (BD PEN NEEDLE NANO U/F) 32G X 4 MM MISC, Use to inject lantus twice a day and as instructed. Dx E11.9, Disp: 100 each, Rfl: 6 .  latanoprost (XALATAN) 0.005 % ophthalmic solution, Place 1 drop into the left eye at bedtime. , Disp: , Rfl: 3 .  metoprolol tartrate (LOPRESSOR) 25 MG tablet, Take 1 tablet (25 mg total) by mouth 2 (two) times daily. (Patient taking differently: Take 12.5 mg by mouth 2 (two) times daily. ), Disp: 60 tablet, Rfl: 3 .  modafinil (PROVIGIL) 200 MG tablet, TAKE 1 TABLET BY MOUTH ONCE DAILY, Disp: 90 tablet, Rfl: 3 .  Omega-3  Fatty Acids (FISH OIL) 1000 MG CAPS, Take 2,000 capsules by mouth 2 (two) times daily. , Disp: , Rfl:  .  oxyCODONE (OXY IR/ROXICODONE) 5 MG immediate release tablet, Take 1 tablet (5 mg total) by mouth every 4 (four) hours as needed for severe pain., Disp: 30 tablet, Rfl: 0 .  rosuvastatin (CRESTOR) 10 MG tablet, TAKE 1 TABLET BY MOUTH DAILY. (Patient taking differently: Take 10 mg by mouth daily), Disp: 90 tablet, Rfl: 1 .  tamsulosin (FLOMAX) 0.4 MG CAPS capsule, TAKE 1 CAPSULE BY MOUTH DAILY (Patient taking differently: Take 0.4 mg by mouth in the morning), Disp: 90 capsule, Rfl: 1 .  torsemide (DEMADEX) 20 MG tablet, Take 20 mg by mouth daily., Disp: , Rfl:   Past Medical History: Past Medical History:  Diagnosis Date  . Allergy   . Arthritis   . Atrial flutter ([place])    a/ 05/2014 s/p RFCA.  Marland Kitchen CAD (coronary artery disease) 10/2007   a. 10/2007 s/p MI/PCI of RCA [facility] - Dr. Collene Mares);  b. 06/2016 MV: EF 34%, inf/inflat infarct; c. 07/2016 Cath: LM nl, LAd 90ost/prox, 41m, 99d, D2 30, RI small, LCX 126m, Om1 40, RCA 50ost, 5 ISR, 35m, 30d, RPDA  80ost, RPL2 70, EF 45-50%;  d. 07/2016 CABG x 5 (LIMA->LAD, VG->Diag, VG->RI, VG->PDA->RPL).  . Chronic systolic CHF (congestive heart failure) ([place])  a. 07/2016 Echo: Ef 30-35%.  . Diabetes mellitus without complication ([place])   . Extrinsic asthma, unspecified   . Glaucoma   . Gout   . Hyperlipidemia   . Hypertensive heart disease   . Ischemic cardiomyopathy    a. 07/2016 Echo: Ef 30-35%, mildly dil LA, mild MR/TR, PASP 24 mmHg.  Marland Kitchen Post-op Afib following CABG (07/2016)    a. 07/2016 - placed on amio post-op.  Marland Kitchen Retinal vascular occlusion, unspecified    Right  . Sleep apnea     Tobacco Use: History  Smoking Status  . Former Smoker  . Packs/day: 1.00  . Years: 20.00  . Types: Cigarettes  . Quit date: 10/02/1988  Smokeless Tobacco  . Never Used    Comment: quit in the 1990's    Labs: Recent Review Flowsheet Data    Labs for ITP  Cardiac and Pulmonary Rehab Latest Ref Rng & Units 07/14/2016 07/14/2016 07/15/2016 07/16/2016 07/16/2016   Cholestrol 0 - 200 mg/dL - - - - -   LDLCALC 0 - 99 mg/dL - - - - -   LDLDIRECT mg/dL - - - - -   HDL >39.00 mg/dL - - - - -   Trlycerides 0.0 - 149.0 mg/dL - - - - -   Hemoglobin A1c 4.8 - 5.6 % - - - - -   PHART 7.350 - 7.450 7.386 7.391 - - 7.346(L)   PCO2ART 32.0 - 48.0 mmHg 35.3 36.8 - - 46.5   HCO3 20.0 - 28.0 mmol/L 21.1 22.3 - - 25.4   TCO2 0 - 100 mmol/L 22 23 25  - 27   ACIDBASEDEF 0.0 - 2.0 mmol/L 3.0(H) 2.0 - - 1.0   O2SAT % 94.0 94.0 - 51.6 94.0       Exercise Target Goals:    Exercise Program Goal: Individual exercise prescription set with THRR, safety & activity barriers. Participant demonstrates ability to understand and report RPE using BORG scale, to self-measure pulse accurately, and to acknowledge the importance of the exercise prescription.  Exercise Prescription Goal: Starting with aerobic activity 30 plus minutes a day, 3 days per week for initial exercise prescription. Provide home exercise prescription and guidelines that participant acknowledges understanding prior to discharge.  Activity Barriers & Risk Stratification:     Activity Barriers & Cardiac Risk Stratification - 08/28/16 1330      Activity Barriers & Cardiac Risk Stratification   Activity Barriers Shortness of Breath;Deconditioning;Muscular Weakness;Joint Problems  Shortness of breat occurs only when climbing stairs.  Has been exercising on the treadmill low speed no elevation and can maintain 30 + minutes without symptoms.; shoulder injury from fall 3 years ago, occasional pain   Cardiac Risk Stratification High      6 Minute Walk:     6 Minute Walk    Row Name 08/28/16 1403         6 Minute Walk   Phase Initial     Distance 1032 feet     Walk Time 5.78 minutes  sat down at 5:47     # of Rest Breaks 1     MPH 2.03     METS 2.53     RPE 17     Perceived Dyspnea  3      VO2 Peak 7.47     Symptoms Yes (comment)     Comments SOB, tightness/fatigue in thighs     Resting HR 85 bpm     Resting BP 106/66     Max  Ex. HR 99 bpm     Max Ex. BP 122/64     2 Minute Post BP 106/54        Initial Exercise Prescription:     Initial Exercise Prescription - 08/28/16 1400      Date of Initial Exercise RX and Referring Provider   Date 08/28/16   Referring Provider Kathlyn Sacramento MD     Treadmill   MPH 1.5   Grade 0   Minutes 15   METs 2.15     NuStep   Level 1   Watts --  60-80 spm   Minutes 15   METs 2     Recumbant Elliptical   Level 1   RPM 50   Minutes 15   METs 2     Prescription Details   Frequency (times per week) 2   Duration Progress to 45 minutes of aerobic exercise without signs/symptoms of physical distress     Intensity   THRR 40-80% of Max Heartrate 111-137   Ratings of Perceived Exertion 11-15   Perceived Dyspnea 0-4     Progression   Progression Continue to progress workloads to maintain intensity without signs/symptoms of physical distress.     Resistance Training   Training Prescription Yes   Weight 3 lbs   Reps 10-12      Perform Capillary Blood Glucose checks as needed.  Exercise Prescription Changes:     Exercise Prescription Changes    Row Name 08/28/16 1300             Exercise Review   Progression -  walk test results         Response to Exercise   Blood Pressure (Admit) 106/66       Blood Pressure (Exercise) 122/64       Blood Pressure (Exit) 106/54       Heart Rate (Admit) 85 bpm       Heart Rate (Exercise) 99 bpm       Heart Rate (Exit) 75 bpm       Oxygen Saturation (Admit) 99 %       Oxygen Saturation (Exercise) 94 %       Rating of Perceived Exertion (Exercise) 17       Symptoms SOB, tightness/fatigue in legs          Exercise Comments:     Exercise Comments    Row Name 08/28/16 1409           Exercise Comments Exercise goal is to get up and down stairs without SOB.           Discharge Exercise Prescription (Final Exercise Prescription Changes):     Exercise Prescription Changes - 08/28/16 1300      Exercise Review   Progression --  walk test results     Response to Exercise   Blood Pressure (Admit) 106/66   Blood Pressure (Exercise) 122/64   Blood Pressure (Exit) 106/54   Heart Rate (Admit) 85 bpm   Heart Rate (Exercise) 99 bpm   Heart Rate (Exit) 75 bpm   Oxygen Saturation (Admit) 99 %   Oxygen Saturation (Exercise) 94 %   Rating of Perceived Exertion (Exercise) 17   Symptoms SOB, tightness/fatigue in legs      Nutrition:  Target Goals: Understanding of nutrition guidelines, daily intake of sodium 1500mg , cholesterol 200mg , calories 30% from fat and 7% or less from saturated fats, daily to have 5 or more servings of fruits and vegetables.  Biometrics:  Pre Biometrics - 08/28/16 1411      Pre Biometrics   Height 5' 10.25" (1.784 m)   Weight 223 lb (101.2 kg)   Waist Circumference 40.5 inches   Hip Circumference 42.5 inches   Waist to Hip Ratio 0.95 %   BMI (Calculated) 31.8   Single Leg Stand 6.54 seconds       Nutrition Therapy Plan and Nutrition Goals:     Nutrition Therapy & Goals - 08/28/16 1319      Intervention Plan   Intervention Prescribe, educate and counsel regarding individualized specific dietary modifications aiming towards targeted core components such as weight, hypertension, lipid management, diabetes, heart failure and other comorbidities.   Expected Outcomes Short Term Goal: Understand basic principles of dietary content, such as calories, fat, sodium, cholesterol and nutrients.;Short Term Goal: A plan has been developed with personal nutrition goals set during dietitian appointment.;Long Term Goal: Adherence to prescribed nutrition plan.      Nutrition Discharge: Rate Your Plate Scores:     Nutrition Assessments - 08/28/16 1320      Rate Your Plate Scores   Pre Score 76   Pre Score % 87.4 %       Nutrition Goals Re-Evaluation:   Psychosocial: Target Goals: Acknowledge presence or absence of depression, maximize coping skills, provide positive support system. Participant is able to verbalize types and ability to use techniques and skills needed for reducing stress and depression.  Initial Review & Psychosocial Screening:     Initial Psych Review & Screening - 08/28/16 1327      Initial Review   Current issues with --  none reported     Family Dynamics   Good Support System? Yes  Wife     Barriers   Psychosocial barriers to participate in program There are no identifiable barriers or psychosocial needs.;The patient should benefit from training in stress management and relaxation.     Screening Interventions   Interventions Encouraged to exercise      Quality of Life Scores:     Quality of Life - 08/28/16 1328      Quality of Life Scores   Health/Function Pre 22.71 %   Socioeconomic Pre 28.29 %   Psych/Spiritual Pre 29.14 %   Family Pre 27 %   GLOBAL Pre 25.88 %      PHQ-9: Recent Review Flowsheet Data    Depression screen [facility] 2/9 08/28/2016 06/14/2016 04/27/2016 03/15/2016 12/01/2015   Decreased Interest 1 0 0 0 0   Down, Depressed, Hopeless 0 0 0 0 0   PHQ - 2 Score 1 0 0 0 0   Altered sleeping 1 - - - -   Tired, decreased energy 1 - - - -   Change in appetite 0 - - - -   Feeling bad or failure about yourself  0 - - - -   Trouble concentrating 0 - - - -   Moving slowly or fidgety/restless 0 - - - -   Suicidal thoughts 0 - - - -   PHQ-9 Score 3 - - - -   Difficult doing work/chores Not difficult at all - - - -      Psychosocial Evaluation and Intervention:   Psychosocial Re-Evaluation:   Vocational Rehabilitation: Provide vocational rehab assistance to qualifying candidates.   Vocational Rehab Evaluation & Intervention:     Vocational Rehab - 08/28/16 1332      Initial Vocational Rehab Evaluation & Intervention   Assessment shows need  for  Vocational Rehabilitation No      Education: Education Goals: Education classes will be provided on a weekly basis, covering required topics. Participant will state understanding/return demonstration of topics presented.  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 Gives an explanation and review of dietary fats and fiber.   Controlling Sodium/Reading Food Labels: -Group verbal and written material supporting the discussion of sodium use in heart healthy nutrition. Review and explanation with models, verbal and written materials for utilization of the food label.   Exercise Physiology & Risk Factors: - Group verbal and written instruction with models to review the exercise physiology of the cardiovascular system and associated critical values. Details cardiovascular disease risk factors and the goals associated with each risk factor.   Aerobic Exercise & Resistance Training: - Gives group verbal and written discussion on the health impact of inactivity. On the components of aerobic and resistive training programs and the benefits of this training and how to safely progress through these programs.   Flexibility, Balance, General Exercise Guidelines: - Provides group verbal and written instruction on the benefits of flexibility and balance training programs. Provides general exercise guidelines with specific guidelines to those with heart or lung disease. Demonstration and skill practice provided.   Stress Management: - Provides group verbal and written instruction about the health risks of elevated  stress, cause of high stress, and healthy ways to reduce stress.   Depression: - Provides group verbal and written instruction on the correlation between heart/lung disease and depressed mood, treatment options, and the stigmas associated with seeking treatment.   Anatomy & Physiology of the Heart: - Group verbal and written instruction and models provide basic cardiac anatomy and physiology, with the coronary electrical and arterial systems. Review of: AMI, Angina, Valve disease, Heart Failure, Cardiac Arrhythmia, Pacemakers, and the ICD.   Cardiac Procedures: - Group verbal and written instruction and models to describe the testing methods done to diagnose heart disease. Reviews the outcomes of the test results. Describes the treatment choices: Medical Management, Angioplasty, or Coronary Bypass Surgery.   Cardiac Medications: - Group verbal and written instruction to review commonly prescribed medications for heart disease. Reviews the medication, class of the drug, and side effects. Includes the steps to properly store meds and maintain the prescription regimen.   Go Sex-Intimacy & Heart Disease, Get SMART - Goal Setting: - Group verbal and written instruction through game format to discuss heart disease and the return to sexual intimacy. Provides group verbal and written material to discuss and apply goal setting through the application of the S.M.A.R.T. Method.   Other Matters of the Heart: - Provides group verbal, written materials and models to describe Heart Failure, Angina, Valve Disease, and Diabetes in the realm of heart disease. Includes description of the disease process and treatment options available to the cardiac patient.   Exercise & Equipment Safety: - Individual verbal instruction and demonstration of equipment use and safety with use of the equipment. Flowsheet Row Cardiac Rehab from 08/28/2016 in [facility] Cardiac and Pulmonary Rehab  Date  08/28/16  Educator  SB   Instruction Review Code  2- meets goals/outcomes  Infection Prevention: - Provides verbal and written material to individual with discussion of infection control including proper hand washing and proper equipment cleaning during exercise session. Flowsheet Row Cardiac Rehab from 08/28/2016 in [facility] Cardiac and Pulmonary Rehab  Date  08/28/16  Educator  SB  Instruction Review Code  2- meets goals/outcomes      Falls Prevention: - Provides verbal and written material to individual with discussion of falls prevention and safety. Flowsheet Row Cardiac Rehab from 08/28/2016 in [facility] Cardiac and Pulmonary Rehab  Date  08/28/16  Educator  SB  Instruction Review Code  2- meets goals/outcomes      Diabetes: - Individual verbal and written instruction to review signs/symptoms of diabetes, desired ranges of glucose level fasting, after meals and with exercise. Advice that pre and post exercise glucose checks will be done for 3 sessions at entry of program. Flowsheet Row Cardiac Rehab from 08/28/2016 in [facility] Cardiac and Pulmonary Rehab  Date  08/28/16  Educator  SB  Instruction Review Code  2- meets goals/outcomes       Knowledge Questionnaire Score:     Knowledge Questionnaire Score - 08/28/16 1332      Knowledge Questionnaire Score   Pre Score 18/28  Reviewed with Legrand Como the results and answers      Core Components/Risk Factors/Patient Goals at Admission:     Personal Goals and Risk Factors at Admission - 08/28/16 1320      Core Components/Risk Factors/Patient Goals on Admission    Weight Management Yes;Obesity   Intervention Weight Management: Develop a combined nutrition and exercise program designed to reach desired caloric intake, while maintaining appropriate intake of nutrient and fiber, sodium and fats, and appropriate energy expenditure required for the weight goal.   Admit Weight 223 lb (101.2 kg)   Goal Weight: Short Term 218 lb (98.9 kg)   Goal Weight: Long  Term 200 lb (90.7 kg)   Expected Outcomes Short Term: Continue to assess and modify interventions until short term weight is achieved;Long Term: Adherence to nutrition and physical activity/exercise program aimed toward attainment of established weight goal;Weight Maintenance: Understanding of the daily nutrition guidelines, which includes 25-35% calories from fat, 7% or less cal from saturated fats, less than 200mg  cholesterol, less than 1.5gm of sodium, & 5 or more servings of fruits and vegetables daily   Sedentary Yes   Intervention Provide advice, education, support and counseling about physical activity/exercise needs.;Develop an individualized exercise prescription for aerobic and resistive training based on initial evaluation findings, risk stratification, comorbidities and participant's personal goals.   Expected Outcomes Achievement of increased cardiorespiratory fitness and enhanced flexibility, muscular endurance and strength shown through measurements of functional capacity and personal statement of participant.   Increase Strength and Stamina Yes  Able to climb stairs without SOB   Intervention Provide advice, education, support and counseling about physical activity/exercise needs.;Develop an individualized exercise prescription for aerobic and resistive training based on initial evaluation findings, risk stratification, comorbidities and participant's personal goals.   Expected Outcomes Achievement of increased cardiorespiratory fitness and enhanced flexibility, muscular endurance and strength shown through measurements of functional capacity and personal statement of participant.   Diabetes Yes   Intervention Provide education about signs/symptoms and action to take for hypo/hyperglycemia.;Provide education about proper nutrition, including hydration, and aerobic/resistive exercise prescription along with prescribed medications to achieve blood glucose in normal ranges: Fasting glucose  65-99 mg/dL   Expected Outcomes Short Term: Participant verbalizes understanding of the signs/symptoms and immediate care of hyper/hypoglycemia, proper  foot care and importance of medication, aerobic/resistive exercise and nutrition plan for blood glucose control.;Long Term: Attainment of HbA1C < 7%.   Heart Failure Yes   Intervention Provide a combined exercise and nutrition program that is supplemented with education, support and counseling about heart failure. Directed toward relieving symptoms such as shortness of breath, decreased exercise tolerance, and extremity edema.   Expected Outcomes Improve functional capacity of life;Short term: Attendance in program 2-3 days a week with increased exercise capacity. Reported lower sodium intake. Reported increased fruit and vegetable intake. Reports medication compliance.;Short term: Daily weights obtained and reported for increase. Utilizing diuretic protocols set by physician.;Long term: Adoption of self-care skills and reduction of barriers for early signs and symptoms recognition and intervention leading to self-care maintenance.   Hypertension Yes   Intervention Provide education on lifestyle modifcations including regular physical activity/exercise, weight management, moderate sodium restriction and increased consumption of fresh fruit, vegetables, and low fat dairy, alcohol moderation, and smoking cessation.;Monitor prescription use compliance.   Expected Outcomes Short Term: Continued assessment and intervention until BP is < 140/41mm HG in hypertensive participants. < 130/60mm HG in hypertensive participants with diabetes, heart failure or chronic kidney disease.;Long Term: Maintenance of blood pressure at goal levels.   Lipids Yes   Intervention Provide education and support for participant on nutrition & aerobic/resistive exercise along with prescribed medications to achieve LDL 70mg , HDL >40mg .   Expected Outcomes Short Term: Participant states  understanding of desired cholesterol values and is compliant with medications prescribed. Participant is following exercise prescription and nutrition guidelines.;Long Term: Cholesterol controlled with medications as prescribed, with individualized exercise RX and with personalized nutrition plan. Value goals: LDL < 70mg , HDL > 40 mg.      Core Components/Risk Factors/Patient Goals Review:    Core Components/Risk Factors/Patient Goals at Discharge (Final Review):    ITP Comments:     ITP Comments    Row Name 08/28/16 1313 09/06/16 0600         ITP Comments Med review completed. Initial ITP created. Diagnosis documentation can be found in [facility] Encounter 07/10/2016 30 day review completed for review by Dr Emily Filbert.  Continue with ITP unless changes noted by Dr Sabra Heck.         Comments:

## 2016-09-07 ENCOUNTER — Encounter: Payer: PPO | Attending: Cardiovascular Disease

## 2016-09-07 DIAGNOSIS — Z951 Presence of aortocoronary bypass graft: Secondary | ICD-10-CM

## 2016-09-07 LAB — GLUCOSE, CAPILLARY
GLUCOSE-CAPILLARY: 115 mg/dL — AB (ref 65–99)
GLUCOSE-CAPILLARY: 99 mg/dL (ref 65–99)

## 2016-09-07 NOTE — Progress Notes (Signed)
Daily Session Note  Patient Details  Name: Tommy Sullivan MRN: [id number] Date of Birth: 07/24/1946 Referring Provider:   Flowsheet Row Cardiac Rehab from 08/28/2016 in [facility] Cardiac and Pulmonary Rehab  Referring Provider  Arida, Muhammad MD      Encounter Date: 09/07/2016  Check In:     Session Check In - 09/07/16 0909      Check-In   Location [facility]-Cardiac & Pulmonary Rehab   Staff Present Jessica Hawkins, MA, ACSM RCEP, Exercise Physiologist;Amanda Sommer, BA, ACSM CEP, Exercise Physiologist;Other  Tricia Surles, RN         Goals Met:  Proper associated with RPD/PD & O2 Sat Independence with exercise equipment Exercise tolerated well Strength training completed today  Goals Unmet:  Not Applicable  Comments: First full day of exercise!  Patient was oriented to gym and equipment including functions, settings, policies, and procedures.  Patient's individual exercise prescription and treatment plan were reviewed.  All starting workloads were established based on the results of the 6 minute walk test done at initial orientation visit.  The plan for exercise progression was also introduced and progression will be customized based on patient's performance and goals.    Dr. Mark Miller is Medical Director for [facility] and [facility]. 

## 2016-09-08 DIAGNOSIS — G4733 Obstructive sleep apnea (adult) (pediatric): Secondary | ICD-10-CM | POA: Diagnosis not present

## 2016-09-12 ENCOUNTER — Encounter: Payer: PPO | Admitting: Respiratory Therapy

## 2016-09-12 DIAGNOSIS — Z951 Presence of aortocoronary bypass graft: Secondary | ICD-10-CM | POA: Diagnosis not present

## 2016-09-12 LAB — GLUCOSE, CAPILLARY
GLUCOSE-CAPILLARY: 120 mg/dL — AB (ref 65–99)
Glucose-Capillary: 100 mg/dL — ABNORMAL HIGH (ref 65–99)

## 2016-09-12 NOTE — Progress Notes (Signed)
Daily Session Note  Patient Details  Name: Tommy Sullivan MRN: [id number] Date of Birth: [birth year] Referring Provider:   Flowsheet Row Cardiac Rehab from 08/28/2016 in [facility] Cardiac and Pulmonary Rehab  Referring Provider  Kathlyn Sacramento MD      Encounter Date: 09/12/2016  Check In:     Session Check In - 09/12/16 0853      Check-In   Location [facility]-Cardiac & Pulmonary Rehab   Staff Present Alberteen Sam, MA, ACSM RCEP, Exercise Physiologist;Susanne Bice, RN, BSN, CCRP;Laureen Owens Shark, BS, RRT, Respiratory Therapist   Supervising physician immediately available to respond to emergencies See telemetry face sheet for immediately available ER MD   Medication changes reported     No   Fall or balance concerns reported    No   Warm-up and Cool-down Performed on first and last piece of equipment   Resistance Training Performed Yes   VAD Patient? No     Pain Assessment   Currently in Pain? No/denies   Multiple Pain Sites No         Goals Met:  Exercise tolerated well No report of cardiac concerns or symptoms Strength training completed today  Goals Unmet:  Not Applicable  Comments: Pt able to follow exercise prescription today without complaint.  Will continue to monitor for progression.    Dr. Emily Filbert is Medical Director for [place] and [facility].

## 2016-09-13 ENCOUNTER — Ambulatory Visit (INDEPENDENT_AMBULATORY_CARE_PROVIDER_SITE_OTHER): Payer: Self-pay | Admitting: Cardiothoracic Surgery

## 2016-09-13 ENCOUNTER — Encounter: Payer: Self-pay | Admitting: Cardiothoracic Surgery

## 2016-09-13 VITALS — BP 92/66 | HR 82 | Resp 16 | Ht 69.5 in | Wt 221.5 lb

## 2016-09-13 DIAGNOSIS — J90 Pleural effusion, not elsewhere classified: Secondary | ICD-10-CM

## 2016-09-13 DIAGNOSIS — Z951 Presence of aortocoronary bypass graft: Secondary | ICD-10-CM

## 2016-09-13 DIAGNOSIS — I2510 Atherosclerotic heart disease of native coronary artery without angina pectoris: Secondary | ICD-10-CM

## 2016-09-13 NOTE — Progress Notes (Signed)
PCP is Eliezer Lofts, MD Referring Provider is Wellington Hampshire, MD  Chief Complaint  Patient presents with  . Routine Post Op    4 wk f/u since last ov 08/16/16    HPI: Patient returns for 2 month follow-up after multivessel vessel bypass surgery in October. The patient presented with symptoms of heart failure and poorly controlled diabetes. He underwent CABG 4. He had problems with atrial fibrillation and heart failure after surgery. His ejection fraction is 35%. His fluid retention and edema have significantly improved although he has residual edema in his right leg from the saphenous vein harvest. He is becoming more active and is scheduled to start outpatient rehabilitation at the hospital. This is exactly what he needs to improve his overall exercise tolerance and strength. He is back in sinus rhythm and is taking aspirin, metoprolol, Crestor, and is not on an ACE inhibitor because his moderately increased creatinine. He is scheduled to see his cardiologist Dr.Ardta soon. Surgical incisions well healed the last chest x-ray is clear.  Past Medical History:  Diagnosis Date  . Allergy   . Arthritis   . Atrial flutter ([place])    a/ 05/2014 s/p RFCA.  Marland Kitchen CAD (coronary artery disease) 10/2007   a. 10/2007 s/p MI/PCI of RCA [facility] - Dr. Collene Mares);  b. 06/2016 MV: EF 34%, inf/inflat infarct; c. 07/2016 Cath: LM nl, LAd 90ost/prox, 19m, 99d, D2 30, RI small, LCX 154m, Om1 40, RCA 50ost, 5 ISR, 32m, 30d, RPDA  80ost, RPL2 70, EF 45-50%;  d. 07/2016 CABG x 5 (LIMA->LAD, VG->Diag, VG->RI, VG->PDA->RPL).  . Chronic systolic CHF (congestive heart failure) ([place])    a. 07/2016 Echo: Ef 30-35%.  . Diabetes mellitus without complication ([place])   . Extrinsic asthma, unspecified   . Glaucoma   . Gout   . Hyperlipidemia   . Hypertensive heart disease   . Ischemic cardiomyopathy    a. 07/2016 Echo: Ef 30-35%, mildly dil LA, mild MR/TR, PASP 24 mmHg.  Marland Kitchen Post-op Afib following CABG (07/2016)    a. 07/2016 -  placed on amio post-op.  Marland Kitchen Retinal vascular occlusion, unspecified    Right  . Sleep apnea     Past Surgical History:  Procedure Laterality Date  . ABLATION  05-07-2014   EPS and ablation of atrial flutter by Dr Caryl Comes  . APPENDECTOMY  1954  . ATRIAL FLUTTER ABLATION N/A 05/07/2014   Procedure: ATRIAL FLUTTER ABLATION;  Surgeon: Deboraha Sprang, MD;  Location: [facility] CATH LAB;  Service: Cardiovascular;  Laterality: N/A;  . CARDIAC CATHETERIZATION Left 07/10/2016   Procedure: Left Heart Cath and Coronary Angiography;  Surgeon: Wellington Hampshire, MD;  Location: [place] CV LAB;  Service: Cardiovascular;  Laterality: Left;  . COLONOSCOPY    . CORONARY ANGIOPLASTY  2009   stent [place]  . CORONARY ARTERY BYPASS GRAFT N/A 07/14/2016   Procedure: CORONARY ARTERY BYPASS GRAFTING (CABG), ON PUMP, TIMES FIVE, USING LEFT INTERNAL MAMMARY ARTERY, RIGHT GREATER SAPHENOUS VEIN HARVESTED ENDOSCOPICALLY;  Surgeon: Ivin Poot, MD;  Location: [place];  Service: Open Heart Surgery;  Laterality: N/A;  SVG to PD and PL sequential SVG to 1st diagonal SVG to Ramus LIMA to LAD  . NECK SURGERY  1990's   x2, Fusion  . SHOULDER ARTHROSCOPY WITH SUBACROMIAL DECOMPRESSION Left 04/02/2014   Procedure: SHOULDER ARTHROSCOPY WITH SUBACROMIAL DECOMPRESSION WITH ROTATOR CUFF REPAIR;  Surgeon: Nita Sells, MD;  Location: [place];  Service: Orthopedics;  Laterality: Left;  Left shoulder rotator cuff repair, subacromial decompression  . SKIN SURGERY    . TEE WITHOUT CARDIOVERSION N/A 07/14/2016   Procedure: TRANSESOPHAGEAL ECHOCARDIOGRAM (TEE);  Surgeon: Ivin Poot, MD;  Location: [place];  Service: Open Heart Surgery;  Laterality: N/A;  . TONSILLECTOMY      Family History  Problem Relation Age of Onset  . Diabetes Sister   . Coronary artery disease Brother   . AAA (abdominal aortic aneurysm) Father   . Heart disease Mother   . Diabetes Mother   . Diabetes Brother   . Colon  cancer Neg Hx     Social History Social History  Substance Use Topics  . Smoking status: Former Smoker    Packs/day: 1.00    Years: 20.00    Types: Cigarettes    Quit date: 10/02/1988  . Smokeless tobacco: Never Used     Comment: quit in the 1990's  . Alcohol use 1.8 oz/week    3 Cans of beer per week     Comment: 3-4 drinks per week.     Current Outpatient Prescriptions  Medication Sig Dispense Refill  . aspirin EC 325 MG EC tablet Take 1 tablet (325 mg total) by mouth daily. 30 tablet 0  . Cholecalciferol (VITAMIN D3) 1000 units CAPS Take 1,000 Units by mouth daily.     . fenofibrate 160 MG tablet TAKE 1 TABLET BY MOUTH DAILY (Patient taking differently: Take 160 mg by mouth daily) 90 tablet 1  . glucose blood (BAYER CONTOUR NEXT TEST) test strip Use to check blood sugar two times daily.  Dx: E11.9 100 each 12  . insulin glargine (LANTUS) 100 UNIT/ML injection Inject 16 Units into the skin at bedtime.    . Insulin Pen Needle (BD PEN NEEDLE NANO U/F) 32G X 4 MM MISC Use to inject lantus twice a day and as instructed. Dx E11.9 100 each 6  . latanoprost (XALATAN) 0.005 % ophthalmic solution Place 1 drop into the left eye at bedtime.   3  . metoprolol tartrate (LOPRESSOR) 25 MG tablet Take 1 tablet (25 mg total) by mouth 2 (two) times daily. (Patient taking differently: Take 12.5 mg by mouth 2 (two) times daily. ) 60 tablet 3  . modafinil (PROVIGIL) 200 MG tablet TAKE 1 TABLET BY MOUTH ONCE DAILY 90 tablet 3  . Omega-3 Fatty Acids (FISH OIL) 1000 MG CAPS Take 2,000 capsules by mouth 2 (two) times daily.     . rosuvastatin (CRESTOR) 10 MG tablet TAKE 1 TABLET BY MOUTH DAILY. (Patient taking differently: Take 10 mg by mouth daily) 90 tablet 1  . tamsulosin (FLOMAX) 0.4 MG CAPS capsule TAKE 1 CAPSULE BY MOUTH DAILY (Patient taking differently: Take 0.4 mg by mouth in the morning) 90 capsule 1  . torsemide (DEMADEX) 20 MG tablet Take 20 mg by mouth daily.     No current  facility-administered medications for this visit.     Allergies  Allergen Reactions  . No Known Allergies     Review of Systems  Multiple complaints including dizziness attributed to ringing in his ears, mild hoarseness, orthostatic symptoms probably related to his Flomax, persistent right leg edema, shortness of breath with exertion but no orthopnea or PND.  BP 92/66 (BP Location: Left Arm, Patient Position: Sitting, Cuff Size: Large)   Pulse 82   Resp 16   Ht 5' 9.5" (1.765 m)   Wt 221 lb 8 oz (100.5 kg)   SpO2 95% Comment: ON RA  BMI 32.24  kg/m  Physical Exam      Exam    General- alert and comfortable   Lungs- clear without rales, wheezes   Cor- regular rate and rhythm, no murmur , gallop   Abdomen- soft, non-tender   Extremities - warm, non-tender, moderate edema in the right leg from vein harvest   Neuro- oriented, appropriate, no focal weakness   Diagnostic Tests: Last chest x-ray is clear no chest x-ray performed at this visit  Impression: Slow but steady progress He should significantly benefit from outpatient cardiac rehabilitation He has had evidence of fluid retention after stopping his Demadex a week ago so he will resume 20 mg by mouth every other day He knows he can resume driving and normal daily activities but not lift more than 20 pounds. He was recommended to see an ENT doctor regarding his horses and your symptoms  Plan: Return for review of progress in 6 weeks.  Len Childs, MD [facility] [phone]

## 2016-09-14 DIAGNOSIS — Z951 Presence of aortocoronary bypass graft: Secondary | ICD-10-CM | POA: Diagnosis not present

## 2016-09-14 LAB — GLUCOSE, CAPILLARY
Glucose-Capillary: 155 mg/dL — ABNORMAL HIGH (ref 65–99)
Glucose-Capillary: 101 mg/dL — ABNORMAL HIGH (ref 65–99)

## 2016-09-14 NOTE — Progress Notes (Signed)
Daily Session Note  Patient Details  Name: Tommy Sullivan MRN: [id number] Date of Birth: 25-Sep-1946 Referring Provider:   Flowsheet Row Cardiac Rehab from 08/28/2016 in [facility] Cardiac and Pulmonary Rehab  Referring Provider  Kathlyn Sacramento MD      Encounter Date: 09/14/2016  Check In:     Session Check In - 09/14/16 0836      Check-In   Location [facility]-Cardiac & Pulmonary Rehab   Staff Present Alberteen Sam, MA, ACSM RCEP, Exercise Physiologist;Amanda Oletta Darter, BA, ACSM CEP, Exercise Physiologist;Other  Jena Gauss RN   Supervising physician immediately available to respond to emergencies See telemetry face sheet for immediately available ER MD   Medication changes reported     No   Fall or balance concerns reported    No   Warm-up and Cool-down Performed on first and last piece of equipment   Resistance Training Performed Yes   VAD Patient? No     Pain Assessment   Currently in Pain? No/denies         Goals Met:  Independence with exercise equipment Exercise tolerated well No report of cardiac concerns or symptoms Strength training completed today  Goals Unmet:  Not Applicable  Comments: Pt able to follow exercise prescription today without complaint.  Will continue to monitor for progression.  Reviewed home exercise with pt today.  Pt plans to using treadmill, elliptical and home gym for exercise.  Reviewed THR, pulse, RPE, sign and symptoms, and when to call 911 or MD.  Also discussed weather considerations and indoor options.  Pt voiced understanding.     Dr. Emily Filbert is Medical Director for [place] and [facility].

## 2016-09-19 ENCOUNTER — Encounter: Payer: PPO | Admitting: Respiratory Therapy

## 2016-09-19 DIAGNOSIS — Z951 Presence of aortocoronary bypass graft: Secondary | ICD-10-CM | POA: Diagnosis not present

## 2016-09-19 NOTE — Progress Notes (Signed)
Daily Session Note  Patient Details  Name: Tommy Sullivan MRN: [id number] Date of Birth: 06-Apr-1946 Referring Provider:   Flowsheet Row Cardiac Rehab from 08/28/2016 in [facility] Cardiac and Pulmonary Rehab  Referring Provider  Kathlyn Sacramento MD      Encounter Date: 09/19/2016  Check In:     Session Check In - 09/19/16 0835      Check-In   Location [facility]-Cardiac & Pulmonary Rehab   Staff Present Alberteen Sam, MA, ACSM RCEP, Exercise Physiologist;Susanne Bice, RN, BSN, CCRP;Laureen Owens Shark, BS, RRT, Respiratory Therapist   Supervising physician immediately available to respond to emergencies See telemetry face sheet for immediately available ER MD   Medication changes reported     No   Fall or balance concerns reported    No   Warm-up and Cool-down Performed on first and last piece of equipment   Resistance Training Performed Yes   VAD Patient? No     Pain Assessment   Currently in Pain? No/denies   Multiple Pain Sites No         Goals Met:  Independence with exercise equipment Exercise tolerated well No report of cardiac concerns or symptoms Strength training completed today  Goals Unmet:  Not Applicable  Comments: Pt able to follow exercise prescription today without complaint.  Will continue to monitor for progression.    Dr. Emily Filbert is Medical Director for [place] and [facility].

## 2016-09-20 ENCOUNTER — Ambulatory Visit: Payer: Self-pay | Admitting: *Deleted

## 2016-09-21 DIAGNOSIS — Z951 Presence of aortocoronary bypass graft: Secondary | ICD-10-CM

## 2016-09-21 NOTE — Progress Notes (Signed)
Daily Session Note  Patient Details  Name: Tommy Sullivan MRN: [id number] Date of Birth: 07/05/1946 Referring Provider:   Flowsheet Row Cardiac Rehab from 08/28/2016 in [facility] Cardiac and Pulmonary Rehab  Referring Provider  Kathlyn Sacramento MD      Encounter Date: 09/21/2016  Check In:     Session Check In - 09/21/16 0843      Check-In   Location [facility]-Cardiac & Pulmonary Rehab   Staff Present Alberteen Sam, MA, ACSM RCEP, Exercise Physiologist;Amanda Oletta Darter, BA, ACSM CEP, Exercise Physiologist;Patricia Surles RN BSN   Supervising physician immediately available to respond to emergencies See telemetry face sheet for immediately available ER MD   Medication changes reported     No   Fall or balance concerns reported    No   Warm-up and Cool-down Performed on first and last piece of equipment   Resistance Training Performed Yes   VAD Patient? No     Pain Assessment   Currently in Pain? No/denies   Multiple Pain Sites No         Goals Met:  Independence with exercise equipment Exercise tolerated well No report of cardiac concerns or symptoms Strength training completed today  Goals Unmet:  Not Applicable  Comments: Pt able to follow exercise prescription today without complaint.  Will continue to monitor for progression.    Dr. Emily Filbert is Medical Director for [place] and [facility].

## 2016-09-26 ENCOUNTER — Encounter: Payer: PPO | Admitting: Respiratory Therapy

## 2016-09-26 DIAGNOSIS — Z951 Presence of aortocoronary bypass graft: Secondary | ICD-10-CM

## 2016-09-26 NOTE — Progress Notes (Signed)
Daily Session Note  Patient Details  Name: Tommy Sullivan MRN: [id number] Date of Birth: 06/09/1946 Referring Provider:   Flowsheet Row Cardiac Rehab from 08/28/2016 in [facility] Cardiac and Pulmonary Rehab  Referring Provider  Kathlyn Sacramento MD      Encounter Date: 09/26/2016  Check In:     Session Check In - 09/26/16 0948      Check-In   Location [facility]-Cardiac & Pulmonary Rehab   Staff Present Alberteen Sam, MA, ACSM RCEP, Exercise Physiologist;Susanne Bice, RN, BSN, CCRP;Laureen Owens Shark, BS, RRT, Respiratory Therapist   Supervising physician immediately available to respond to emergencies See telemetry face sheet for immediately available ER MD   Medication changes reported     No   Fall or balance concerns reported    No   Warm-up and Cool-down Performed on first and last piece of equipment   Resistance Training Performed Yes   VAD Patient? No     Pain Assessment   Currently in Pain? No/denies   Multiple Pain Sites No         Goals Met:  Proper associated with RPD/PD & O2 Sat Independence with exercise equipment Exercise tolerated well No report of cardiac concerns or symptoms Strength training completed today  Goals Unmet:  Not Applicable  Comments: Pt able to follow exercise prescription today without complaint.  Will continue to monitor for progression.   Dr. Emily Filbert is Medical Director for [place] and [facility].

## 2016-09-27 ENCOUNTER — Ambulatory Visit: Payer: PPO | Admitting: *Deleted

## 2016-09-28 ENCOUNTER — Encounter: Payer: PPO | Admitting: *Deleted

## 2016-09-28 DIAGNOSIS — Z951 Presence of aortocoronary bypass graft: Secondary | ICD-10-CM

## 2016-09-28 NOTE — Progress Notes (Signed)
Daily Session Note  Patient Details  Name: Tommy Sullivan MRN: [id number] Date of Birth: 06/04/1946 Referring Provider:   Flowsheet Row Cardiac Rehab from 08/28/2016 in [facility] Cardiac and Pulmonary Rehab  Referring Provider  Arida, Muhammad MD      Encounter Date: 09/28/2016  Check In:     Session Check In - 09/28/16 1017      Check-In   Location [facility]-Cardiac & Pulmonary Rehab   Staff Present  , MA, ACSM RCEP, Exercise Physiologist;Patricia Surles RN BSN;Susanne Bice, RN, BSN, CCRP   Supervising physician immediately available to respond to emergencies See telemetry face sheet for immediately available ER MD   Medication changes reported     No   Fall or balance concerns reported    No   Warm-up and Cool-down Performed on first and last piece of equipment   Resistance Training Performed Yes   VAD Patient? No     Pain Assessment   Currently in Pain? No/denies   Multiple Pain Sites No         Goals Met:  Independence with exercise equipment Exercise tolerated well Personal goals reviewed No report of cardiac concerns or symptoms Strength training completed today  Goals Unmet:  Not Applicable  Comments: Pt able to follow exercise prescription today without complaint.  Will continue to monitor for progression. See ITP for goal review   Dr. Mark Miller is Medical Director for [facility] and [facility]. 

## 2016-10-03 ENCOUNTER — Other Ambulatory Visit: Payer: Self-pay | Admitting: *Deleted

## 2016-10-03 ENCOUNTER — Encounter: Payer: Self-pay | Admitting: *Deleted

## 2016-10-03 ENCOUNTER — Encounter: Payer: PPO | Attending: Cardiovascular Disease | Admitting: Respiratory Therapy

## 2016-10-03 DIAGNOSIS — Z951 Presence of aortocoronary bypass graft: Secondary | ICD-10-CM

## 2016-10-03 NOTE — Progress Notes (Signed)
Cardiac Individual Treatment Plan  Patient Details  Name: Tommy Sullivan MRN: [id number] Date of Birth: 03/11/1946 Referring Provider:   Flowsheet Row Cardiac Rehab from 08/28/2016 in [facility] Cardiac and Pulmonary Rehab  Referring Provider  Kathlyn Sacramento MD      Initial Encounter Date:  Flowsheet Row Cardiac Rehab from 08/28/2016 in [facility] Cardiac and Pulmonary Rehab  Date  08/28/16  Referring Provider  Kathlyn Sacramento MD      Visit Diagnosis: S/P CABG x 5  Patient's Home Medications on Admission:  Current Outpatient Prescriptions:  .  aspirin EC 325 MG EC tablet, Take 1 tablet (325 mg total) by mouth daily., Disp: 30 tablet, Rfl: 0 .  Cholecalciferol (VITAMIN D3) 1000 units CAPS, Take 1,000 Units by mouth daily. , Disp: , Rfl:  .  fenofibrate 160 MG tablet, TAKE 1 TABLET BY MOUTH DAILY (Patient taking differently: Take 160 mg by mouth daily), Disp: 90 tablet, Rfl: 1 .  glucose blood (BAYER CONTOUR NEXT TEST) test strip, Use to check blood sugar two times daily.  Dx: E11.9, Disp: 100 each, Rfl: 12 .  insulin glargine (LANTUS) 100 UNIT/ML injection, Inject 16 Units into the skin at bedtime., Disp: , Rfl:  .  Insulin Pen Needle (BD PEN NEEDLE NANO U/F) 32G X 4 MM MISC, Use to inject lantus twice a day and as instructed. Dx E11.9, Disp: 100 each, Rfl: 6 .  latanoprost (XALATAN) 0.005 % ophthalmic solution, Place 1 drop into the left eye at bedtime. , Disp: , Rfl: 3 .  metoprolol tartrate (LOPRESSOR) 25 MG tablet, Take 1 tablet (25 mg total) by mouth 2 (two) times daily. (Patient taking differently: Take 12.5 mg by mouth 2 (two) times daily. ), Disp: 60 tablet, Rfl: 3 .  modafinil (PROVIGIL) 200 MG tablet, TAKE 1 TABLET BY MOUTH ONCE DAILY, Disp: 90 tablet, Rfl: 3 .  Omega-3 Fatty Acids (FISH OIL) 1000 MG CAPS, Take 2,000 capsules by mouth 2 (two) times daily. , Disp: , Rfl:  .  rosuvastatin (CRESTOR) 10 MG tablet, TAKE 1 TABLET BY MOUTH DAILY. (Patient taking differently: Take 10 mg by  mouth daily), Disp: 90 tablet, Rfl: 1 .  tamsulosin (FLOMAX) 0.4 MG CAPS capsule, TAKE 1 CAPSULE BY MOUTH DAILY (Patient taking differently: Take 0.4 mg by mouth in the morning), Disp: 90 capsule, Rfl: 1 .  torsemide (DEMADEX) 20 MG tablet, Take 20 mg by mouth daily., Disp: , Rfl:   Past Medical History: Past Medical History:  Diagnosis Date  . Allergy   . Arthritis   . Atrial flutter ([place])    a/ 05/2014 s/p RFCA.  Marland Kitchen CAD (coronary artery disease) 10/2007   a. 10/2007 s/p MI/PCI of RCA [facility] Dr. Collene Mares);  b. 06/2016 MV: EF 34%, inf/inflat infarct; c. 07/2016 Cath: LM nl, LAd 90ost/prox, 44m 99d, D2 30, RI small, LCX 1086mOm1 40, RCA 50ost, 5 ISR, 9021m0d, RPDA  80ost, RPL2 70, EF 45-50%;  d. 07/2016 CABG x 5 (LIMA->LAD, VG->Diag, VG->RI, VG->PDA->RPL).  . Chronic systolic CH[place]estive [place]  a. 07/2016 Echo: Ef 30-35%.  . Diabetes mellitus without complication ([facility] . Extrinsic asthma, unspecified   . Glaucoma   . Gout   . Hyperlipidemia   . Hypertensive heart disease   . Ischemic cardiomyopathy    a. 07/2016 Echo: Ef 30-35%, mildly dil LA, mild MR/TR, PASP 24 mmHg.  . 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 07/2016 - placed on amio  post-op.  Marland Kitchen Retinal vascular occlusion, unspecified    Right  . Sleep apnea     Tobacco Use: History  Smoking Status  . Former Smoker  . Packs/day: 1.00  . Years: 20.00  . Types: Cigarettes  . Quit date: 10/02/1988  Smokeless Tobacco  . Never Used    Comment: quit in the 1990's    Labs: Recent Review Flowsheet Data    Labs for ITP Cardiac and Pulmonary Rehab Latest Ref Rng & Units 07/14/2016 07/14/2016 07/15/2016 07/16/2016 07/16/2016   Cholestrol 0 - 200 mg/dL - - - - -   LDLCALC 0 - 99 mg/dL - - - - -   LDLDIRECT mg/dL - - - - -   HDL >39.00 mg/dL - - - - -   Trlycerides 0.0 - 149.0 mg/dL - - - - -   Hemoglobin A1c 4.8 - 5.6 % - - - - -   PHART 7.350 - 7.450 7.386 7.391 - - 7.346(L)   PCO2ART 32.0 - 48.0  mmHg 35.3 36.8 - - 46.5   HCO3 20.0 - 28.0 mmol/L 21.1 22.3 - - 25.4   TCO2 0 - 100 mmol/L 22 23 25  - 27   ACIDBASEDEF 0.0 - 2.0 mmol/L 3.0(H) 2.0 - - 1.0   O2SAT % 94.0 94.0 - 51.6 94.0       Exercise Target Goals:    Exercise Program Goal: Individual exercise prescription set with THRR, safety & activity barriers. Participant demonstrates ability to understand and report RPE using BORG scale, to self-measure pulse accurately, and to acknowledge the importance of the exercise prescription.  Exercise Prescription Goal: Starting with aerobic activity 30 plus minutes a day, 3 days per week for initial exercise prescription. Provide home exercise prescription and guidelines that participant acknowledges understanding prior to discharge.  Activity Barriers & Risk Stratification:     Activity Barriers & Cardiac Risk Stratification - 08/28/16 1330      Activity Barriers & Cardiac Risk Stratification   Activity Barriers Shortness of Breath;Deconditioning;Muscular Weakness;Joint Problems  Shortness of breat occurs only when climbing stairs.  Has been exercising on the treadmill low speed no elevation and can maintain 30 + minutes without symptoms.; shoulder injury from fall 3 years ago, occasional pain   Cardiac Risk Stratification High      6 Minute Walk:     6 Minute Walk    Row Name 08/28/16 1403         6 Minute Walk   Phase Initial     Distance 1032 feet     Walk Time 5.78 minutes  sat down at 5:47     # of Rest Breaks 1     MPH 2.03     METS 2.53     RPE 17     Perceived Dyspnea  3     VO2 Peak 7.47     Symptoms Yes (comment)     Comments SOB, tightness/fatigue in thighs     Resting HR 85 bpm     Resting BP 106/66     Max Ex. HR 99 bpm     Max Ex. BP 122/64     2 Minute Post BP 106/54        Initial Exercise Prescription:     Initial Exercise Prescription - 08/28/16 1400      Date of Initial Exercise RX and Referring Provider   Date 08/28/16   Referring  Provider Kathlyn Sacramento MD     Treadmill   MPH  1.5   Grade 0   Minutes 15   METs 2.15     NuStep   Level 1   Watts --  60-80 spm   Minutes 15   METs 2     Recumbant Elliptical   Level 1   RPM 50   Minutes 15   METs 2     Prescription Details   Frequency (times per week) 2   Duration Progress to 45 minutes of aerobic exercise without signs/symptoms of physical distress     Intensity   THRR 40-80% of Max Heartrate 111-137   Ratings of Perceived Exertion 11-15   Perceived Dyspnea 0-4     Progression   Progression Continue to progress workloads to maintain intensity without signs/symptoms of physical distress.     Resistance Training   Training Prescription Yes   Weight 3 lbs   Reps 10-12      Perform Capillary Blood Glucose checks as needed.  Exercise Prescription Changes:     Exercise Prescription Changes    Row Name 08/28/16 1300 09/12/16 1600 09/14/16 1000 09/26/16 1500       Exercise Review   Progression -  walk test results Yes Yes Yes      Response to Exercise   Blood Pressure (Admit) 106/66 104/60  - 100/66    Blood Pressure (Exercise) 122/64 118/64  - 132/70    Blood Pressure (Exit) 106/54 108/68  - 106/64    Heart Rate (Admit) 85 bpm 79 bpm  - 82 bpm    Heart Rate (Exercise) 99 bpm 108 bpm  - 135 bpm    Heart Rate (Exit) 75 bpm 75 bpm  - 116 bpm    Oxygen Saturation (Admit) 99 %  -  -  -    Oxygen Saturation (Exercise) 94 %  -  -  -    Rating of Perceived Exertion (Exercise) 17 14 14 15     Symptoms SOB, tightness/fatigue in legs none none none    Comments  -  - Home Exercise Guidelines given 09/14/16 Home Exercise Guidelines given 09/14/16    Duration  - Progress to 45 minutes of aerobic exercise without signs/symptoms of physical distress Progress to 45 minutes of aerobic exercise without signs/symptoms of physical distress Progress to 45 minutes of aerobic exercise without signs/symptoms of physical distress    Intensity  - THRR unchanged  THRR unchanged THRR unchanged      Progression   Progression  - Continue to progress workloads to maintain intensity without signs/symptoms of physical distress. Continue to progress workloads to maintain intensity without signs/symptoms of physical distress. Continue to progress workloads to maintain intensity without signs/symptoms of physical distress.    Average METs  - 1.85 1.85 2.1      Resistance Training   Training Prescription  - Yes Yes Yes    Weight  - 3 lbs 3 lbs 3 lbs    Reps  - 10-12 10-12 10-12      Interval Training   Interval Training  - No No No      Treadmill   MPH  - 1.5 1.5 1.8    Grade  - 0 0 0.5    Minutes  - 15 15 15     METs  - 2.15 2.15 2.5      NuStep   Level  - 1 1 1     Minutes  - 15 15 15     METs  - 1.6 1.6 2.3  Recumbant Elliptical   Level  - 1 1 1     Minutes  - 15 15 15     METs  - 1.8 1.8 1.5      Home Exercise Plan   Plans to continue exercise at  -  - Home  walking, treadmill, elliptical, home gym Home  walking, treadmill, elliptical, home gym    Frequency  -  - Add 3 additional days to program exercise sessions. Add 3 additional days to program exercise sessions.       Exercise Comments:     Exercise Comments    Row Name 08/28/16 1409 09/07/16 0911 09/12/16 1606 09/14/16 1037 09/26/16 1542   Exercise Comments Exercise goal is to get up and down stairs without SOB. First full day of exercise!  Patient was oriented to gym and equipment including functions, settings, policies, and procedures.  Patient's individual exercise prescription and treatment plan were reviewed.  All starting workloads were established based on the results of the 6 minute walk test done at initial orientation visit.  The plan for exercise progression was also introduced and progression will be customized based on patient's performance and goals. Ronalee Belts is off to a good start with exercise.  His blood sugars have been great!  We will continue to monitor his progression.  Reviewed home exercise with pt today.  Pt plans to using treadmill, elliptical and home gym for exercise.  Reviewed THR, pulse, RPE, sign and symptoms, and when to call 911 or MD.  Also discussed weather considerations and indoor options.  Pt voiced understanding. Ronalee Belts has been doing well in rehab.  Today, we added both speed and incline to his treadmill; he did have to take a break, but more do to incline than speed.  He wants to be able to walk around his neighborhood, but the hills are his limitation, so we will work on his incline tolerance.  We will continue to monitor his progression.      Discharge Exercise Prescription (Final Exercise Prescription Changes):     Exercise Prescription Changes - 09/26/16 1500      Exercise Review   Progression Yes     Response to Exercise   Blood Pressure (Admit) 100/66   Blood Pressure (Exercise) 132/70   Blood Pressure (Exit) 106/64   Heart Rate (Admit) 82 bpm   Heart Rate (Exercise) 135 bpm   Heart Rate (Exit) 116 bpm   Rating of Perceived Exertion (Exercise) 15   Symptoms none   Comments Home Exercise Guidelines given 09/14/16   Duration Progress to 45 minutes of aerobic exercise without signs/symptoms of physical distress   Intensity THRR unchanged     Progression   Progression Continue to progress workloads to maintain intensity without signs/symptoms of physical distress.   Average METs 2.1     Resistance Training   Training Prescription Yes   Weight 3 lbs   Reps 10-12     Interval Training   Interval Training No     Treadmill   MPH 1.8   Grade 0.5   Minutes 15   METs 2.5     NuStep   Level 1   Minutes 15   METs 2.3     Recumbant Elliptical   Level 1   Minutes 15   METs 1.5     Home Exercise Plan   Plans to continue exercise at Home  walking, treadmill, elliptical, home gym   Frequency Add 3 additional days to program exercise sessions.  Nutrition:  Target Goals: Understanding of nutrition guidelines, daily  intake of sodium <1531m, cholesterol <2072m calories 30% from fat and 7% or less from saturated fats, daily to have 5 or more servings of fruits and vegetables.  Biometrics:     Pre Biometrics - 08/28/16 1411      Pre Biometrics   Height 5' 10.25" (1.784 m)   Weight 223 lb (101.2 kg)   Waist Circumference 40.5 inches   Hip Circumference 42.5 inches   Waist to Hip Ratio 0.95 %   BMI (Calculated) 31.8   Single Leg Stand 6.54 seconds       Nutrition Therapy Plan and Nutrition Goals:     Nutrition Therapy & Goals - 08/28/16 1319      Intervention Plan   Intervention Prescribe, educate and counsel regarding individualized specific dietary modifications aiming towards targeted core components such as weight, hypertension, lipid management, diabetes, heart failure and other comorbidities.   Expected Outcomes Short Term Goal: Understand basic principles of dietary content, such as calories, fat, sodium, cholesterol and nutrients.;Short Term Goal: A plan has been developed with personal nutrition goals set during dietitian appointment.;Long Term Goal: Adherence to prescribed nutrition plan.      Nutrition Discharge: Rate Your Plate Scores:     Nutrition Assessments - 08/28/16 1320      Rate Your Plate Scores   Pre Score 76   Pre Score % 87.4 %      Nutrition Goals Re-Evaluation:     Nutrition Goals Re-Evaluation    Row Name 09/28/16 1028             Intervention Plan   Intervention Continue to educate, counsel and set short/long term goals regarding individualized specific personal dietary modifications.       Comments Scheduled dietician appointment for pt on Jan 1/25 at 1030am.  He has been following a heart healthy diet and really trying to control his sodium intake.          Psychosocial: Target Goals: Acknowledge presence or absence of depression, maximize coping skills, provide positive support system. Participant is able to verbalize types and ability to use  techniques and skills needed for reducing stress and depression.  Initial Review & Psychosocial Screening:     Initial Psych Review & Screening - 08/28/16 1327      Initial Review   Current issues with --  none reported     Family Dynamics   Good Support System? Yes  Wife     Barriers   Psychosocial barriers to participate in program There are no identifiable barriers or psychosocial needs.;The patient should benefit from training in stress management and relaxation.     Screening Interventions   Interventions Encouraged to exercise      Quality of Life Scores:     Quality of Life - 08/28/16 1328      Quality of Life Scores   Health/Function Pre 22.71 %   Socioeconomic Pre 28.29 %   Psych/Spiritual Pre 29.14 %   Family Pre 27 %   GLOBAL Pre 25.88 %      PHQ-9: Recent Review Flowsheet Data    [facility] 08/28/2016 06/14/2016 04/27/2016 03/15/2016 12/01/2015   Decreased Interest 1 0 0 0 0   Down, Depressed, Hopeless 0 0 0 0 0   PHQ - 2 Score 1 0 0 0 0   Altered sleeping 1 - - - -   Tired, decreased energy 1 - - - -  Change in appetite 0 - - - -   Feeling bad or failure about yourself  0 - - - -   Trouble concentrating 0 - - - -   Moving slowly or fidgety/restless 0 - - - -   Suicidal thoughts 0 - - - -   PHQ-9 Score 3 - - - -   Difficult doing work/chores Not difficult at all - - - -      Psychosocial Evaluation and Intervention:     Psychosocial Evaluation - 09/12/16 0946      Psychosocial Evaluation & Interventions   Interventions Encouraged to exercise with the program and follow exercise prescription   Comments Counselor met with Mr. Ruddy (Mr. H) today for initial psychosocial evaluation.  He is a 71 yr old who had triple bypass in October.  He has a spouse of 40 years and some relatives of his spouse who live locally and provide support for him.  Mr. Lemmie Evens reports having multiple health issues with shoulder and knee problems; diabetes; blind  in one eye; and sleep apnea in addition to his cardiac issues.  He states his appetite is good and he denies a history or current symptoms of depression or anxiety.  Mr. Lemmie Evens reports he is typically in a positive mood and has minimal stress in his life.  He has goals to be able to walk up the stairs without shortness of breath.  He has exercise equipment at home that he will begin using once he is released to do so.  Staff will follow with Mr. Lemmie Evens throughout the course of this program.        Psychosocial Re-Evaluation:   Vocational Rehabilitation: Provide vocational rehab assistance to qualifying candidates.   Vocational Rehab Evaluation & Intervention:     Vocational Rehab - 08/28/16 1332      Initial Vocational Rehab Evaluation & Intervention   Assessment shows need for Vocational Rehabilitation No      Education: Education Goals: Education classes will be provided on a weekly basis, covering required topics. Participant will state understanding/return demonstration of topics presented.  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 Gives an explanation and review of dietary fats and fiber.   Controlling Sodium/Reading Food Labels: -Group verbal and written material supporting the discussion of sodium use in heart healthy nutrition. Review and explanation with models, verbal and written materials for utilization of the food label.   Exercise Physiology & Risk Factors: - Group verbal and written instruction with models to review the exercise physiology of the cardiovascular system and associated critical  values. Details cardiovascular disease risk factors and the goals associated with each risk factor.   Aerobic Exercise & Resistance Training: - Gives group verbal and written discussion on the health impact of inactivity. On the components of aerobic and resistive training programs and the benefits of this training and how to safely progress through these programs.   Flexibility, Balance, General Exercise Guidelines: - Provides group verbal and written instruction on the benefits of flexibility and balance training programs. Provides general exercise guidelines with specific guidelines to those with heart or lung disease. Demonstration and skill practice provided.  Stress Management: - Provides group verbal and written instruction about the health risks of elevated stress, cause of high stress, and healthy ways to reduce stress. Flowsheet Row Cardiac Rehab from 09/21/2016 in [facility] Cardiac and Pulmonary Rehab  Date  09/14/16  Educator  TS  Instruction Review Code  2- meets goals/outcomes      Depression: - Provides group verbal and written instruction on the correlation between heart/lung disease and depressed mood, treatment options, and the stigmas associated with seeking treatment.   Anatomy & Physiology of the Heart: - Group verbal and written instruction and models provide basic cardiac anatomy and physiology, with the coronary electrical and arterial systems. Review of: AMI, Angina, Valve disease, Heart Failure, Cardiac Arrhythmia, Pacemakers, and the ICD.   Cardiac Procedures: - Group verbal and written instruction and models to describe the testing methods done to diagnose heart disease. Reviews the outcomes of the test results. Describes the treatment choices: Medical Management, Angioplasty, or Coronary Bypass Surgery. Flowsheet Row Cardiac Rehab from 09/21/2016 in [facility] Cardiac and Pulmonary Rehab  Date  09/12/16  Educator  LB  Instruction Review Code  2- meets  goals/outcomes      Cardiac Medications: - Group verbal and written instruction to review commonly prescribed medications for heart disease. Reviews the medication, class of the drug, and side effects. Includes the steps to properly store meds and maintain the prescription regimen. Flowsheet Row Cardiac Rehab from 09/21/2016 in [facility] Cardiac and Pulmonary Rehab  Date  09/21/16 Marisue Humble 2]  Educator  TS  Instruction Review Code  2- meets goals/outcomes      Go Sex-Intimacy & Heart Disease, Get SMART - Goal Setting: - Group verbal and written instruction through game format to discuss heart disease and the return to sexual intimacy. Provides group verbal and written material to discuss and apply goal setting through the application of the S.M.A.R.T. Method. Flowsheet Row Cardiac Rehab from 09/21/2016 in [facility] Cardiac and Pulmonary Rehab  Date  09/12/16  Educator  LB  Instruction Review Code  2- meets goals/outcomes      Other Matters of the Heart: - Provides group verbal, written materials and models to describe Heart Failure, Angina, Valve Disease, and Diabetes in the realm of heart disease. Includes description of the disease process and treatment options available to the cardiac patient.   Exercise & Equipment Safety: - Individual verbal instruction and demonstration of equipment use and safety with use of the equipment. Flowsheet Row Cardiac Rehab from 09/21/2016 in [facility] Cardiac and Pulmonary Rehab  Date  08/28/16  Educator  SB  Instruction Review Code  2- meets goals/outcomes      Infection Prevention: - Provides verbal and written material to individual with discussion of infection control including proper hand washing and proper equipment cleaning during exercise session. Flowsheet Row Cardiac Rehab from 09/21/2016 in [facility] Cardiac and Pulmonary Rehab  Date  08/28/16  Educator  SB  Instruction Review Code  2- meets goals/outcomes      Falls Prevention: - Provides verbal  and written material to individual with discussion of falls prevention and safety. Flowsheet Row Cardiac Rehab from 09/21/2016 in [facility] Cardiac and Pulmonary Rehab  Date  08/28/16  Educator  SB  Instruction Review Code  2- meets goals/outcomes      Diabetes: - Individual verbal and written instruction to review signs/symptoms of diabetes, desired ranges of glucose level fasting, after meals and with exercise. Advice that pre and post exercise glucose checks will be done for 3 sessions  at entry of program. Flowsheet Row Cardiac Rehab from 09/21/2016 in [facility] Cardiac and Pulmonary Rehab  Date  08/28/16  Educator  SB  Instruction Review Code  2- meets goals/outcomes       Knowledge Questionnaire Score:     Knowledge Questionnaire Score - 08/28/16 1332      Knowledge Questionnaire Score   Pre Score 18/28  Reviewed with Legrand Como the results and answers      Core Components/Risk Factors/Patient Goals at Admission:     Personal Goals and Risk Factors at Admission - 08/28/16 1320      Core Components/Risk Factors/Patient Goals on Admission    Weight Management Yes;Obesity   Intervention Weight Management: Develop a combined nutrition and exercise program designed to reach desired caloric intake, while maintaining appropriate intake of nutrient and fiber, sodium and fats, and appropriate energy expenditure required for the weight goal.   Admit Weight 223 lb (101.2 kg)   Goal Weight: Short Term 218 lb (98.9 kg)   Goal Weight: Long Term 200 lb (90.7 kg)   Expected Outcomes Short Term: Continue to assess and modify interventions until short term weight is achieved;Long Term: Adherence to nutrition and physical activity/exercise program aimed toward attainment of established weight goal;Weight Maintenance: Understanding of the daily nutrition guidelines, which includes 25-35% calories from fat, 7% or less cal from saturated fats, less than 225m cholesterol, less than 1.5gm of sodium, & 5 or  more servings of fruits and vegetables daily   Sedentary Yes   Intervention Provide advice, education, support and counseling about physical activity/exercise needs.;Develop an individualized exercise prescription for aerobic and resistive training based on initial evaluation findings, risk stratification, comorbidities and participant's personal goals.   Expected Outcomes Achievement of increased cardiorespiratory fitness and enhanced flexibility, muscular endurance and strength shown through measurements of functional capacity and personal statement of participant.   Increase Strength and Stamina Yes  Able to climb stairs without SOB   Intervention Provide advice, education, support and counseling about physical activity/exercise needs.;Develop an individualized exercise prescription for aerobic and resistive training based on initial evaluation findings, risk stratification, comorbidities and participant's personal goals.   Expected Outcomes Achievement of increased cardiorespiratory fitness and enhanced flexibility, muscular endurance and strength shown through measurements of functional capacity and personal statement of participant.   Diabetes Yes   Intervention Provide education about signs/symptoms and action to take for hypo/hyperglycemia.;Provide education about proper nutrition, including hydration, and aerobic/resistive exercise prescription along with prescribed medications to achieve blood glucose in normal ranges: Fasting glucose 65-99 mg/dL   Expected Outcomes Short Term: Participant verbalizes understanding of the signs/symptoms and immediate care of hyper/hypoglycemia, proper foot care and importance of medication, aerobic/resistive exercise and nutrition plan for blood glucose control.;Long Term: Attainment of HbA1C < 7%.   Heart Failure Yes   Intervention Provide a combined exercise and nutrition program that is supplemented with education, support and counseling about heart failure.  Directed toward relieving symptoms such as shortness of breath, decreased exercise tolerance, and extremity edema.   Expected Outcomes Improve functional capacity of life;Short term: Attendance in program 2-3 days a week with increased exercise capacity. Reported lower sodium intake. Reported increased fruit and vegetable intake. Reports medication compliance.;Short term: Daily weights obtained and reported for increase. Utilizing diuretic protocols set by physician.;Long term: Adoption of self-care skills and reduction of barriers for early signs and symptoms recognition and intervention leading to self-care maintenance.   Hypertension Yes   Intervention Provide education on lifestyle modifcations including regular physical  activity/exercise, weight management, moderate sodium restriction and increased consumption of fresh fruit, vegetables, and low fat dairy, alcohol moderation, and smoking cessation.;Monitor prescription use compliance.   Expected Outcomes Short Term: Continued assessment and intervention until BP is < 140/37m HG in hypertensive participants. < 130/836mHG in hypertensive participants with diabetes, heart failure or chronic kidney disease.;Long Term: Maintenance of blood pressure at goal levels.   Lipids Yes   Intervention Provide education and support for participant on nutrition & aerobic/resistive exercise along with prescribed medications to achieve LDL <7043mHDL >43m37m Expected Outcomes Short Term: Participant states understanding of desired cholesterol values and is compliant with medications prescribed. Participant is following exercise prescription and nutrition guidelines.;Long Term: Cholesterol controlled with medications as prescribed, with individualized exercise RX and with personalized nutrition plan. Value goals: LDL < 70mg49mL > 40 mg.      Core Components/Risk Factors/Patient Goals Review:      Goals and Risk Factor Review    Row Name 09/28/16 1017              Core Components/Risk Factors/Patient Goals Review   Personal Goals Review Weight Management/Obesity;Sedentary;Increase Strength and Stamina;Improve shortness of breath with ADL's;Hypertension;Diabetes;Lipids;Heart Failure       Review [name] Ronalee Beltsff to a good start in rehab.  He is already feeling stronger with increased stamina. Now he can go up the stairs without a break 50% of the time!  He is exercising at home on his bike and elliptical on his off days.  He is currently doing 20 min of exercise, we talked about bumping this up to 30 min.  His balance is still off and he hopes to improve as he continues to get stronger.  He is doing well with his diabetes and heart healthy diet.  His weight fluctuates based off when he takes his dieuretic.  His right leg is still swollen and tight, especialy when compared to left leg.  He is wearing a compression stocking and the MD is aware.  He has no other HF symptoms currently.  He has had no problems with his medications.       Expected Outcomes Short Term: [name] Ronalee Belts increase his exercise time at home to 30 min.  Long Term:  [name] Ronalee Belts continue to come to class to work on his risk factors.          Core Components/Risk Factors/Patient Goals at Discharge (Final Review):      Goals and Risk Factor Review - 09/28/16 1017      Core Components/Risk Factors/Patient Goals Review   Personal Goals Review Weight Management/Obesity;Sedentary;Increase Strength and Stamina;Improve shortness of breath with ADL's;Hypertension;Diabetes;Lipids;Heart Failure   Review [name] Ronalee Beltsff to a good start in rehab.  He is already feeling stronger with increased stamina. Now he can go up the stairs without a break 50% of the time!  He is exercising at home on his bike and elliptical on his off days.  He is currently doing 20 min of exercise, we talked about bumping this up to 30 min.  His balance is still off and he hopes to improve as he continues to get stronger.  He is doing well with  his diabetes and heart healthy diet.  His weight fluctuates based off when he takes his dieuretic.  His right leg is still swollen and tight, especialy when compared to left leg.  He is wearing a compression stocking and the MD is aware.  He has no other HF symptoms  currently.  He has had no problems with his medications.   Expected Outcomes Short Term: Ronalee Belts will increase his exercise time at home to 30 min.  Long Term:  Ronalee Belts will continue to come to class to work on his risk factors.      ITP Comments:     ITP Comments    Row Name 08/28/16 1313 09/06/16 0600 10/03/16 0622       ITP Comments Med review completed. Initial ITP created. Diagnosis documentation can be found in [facility] Encounter 07/10/2016 30 day review completed for review by Dr Emily Filbert.  Continue with ITP unless changes noted by Dr Sabra Heck. 30 day review. Continue with ITP unless directed changes per Medical Director review.        Comments:

## 2016-10-03 NOTE — Progress Notes (Signed)
Daily Session Note  Patient Details  Name: LORI LIEW MRN: [id number] Date of Birth: 09-20-1946 Referring Provider:   Flowsheet Row Cardiac Rehab from 08/28/2016 in [facility] Cardiac and Pulmonary Rehab  Referring Provider  Kathlyn Sacramento MD      Encounter Date: 10/03/2016  Check In:     Session Check In - 10/03/16 0903      Check-In   Location [facility]-Cardiac & Pulmonary Rehab   Staff Present Alberteen Sam, MA, ACSM RCEP, Exercise Physiologist;Susanne Bice, RN, BSN, CCRP;Laureen Owens Shark, BS, RRT, Respiratory Therapist   Supervising physician immediately available to respond to emergencies See telemetry face sheet for immediately available ER MD   Medication changes reported     No   Fall or balance concerns reported    No   Warm-up and Cool-down Performed on first and last piece of equipment   Resistance Training Performed Yes   VAD Patient? No     Pain Assessment   Currently in Pain? No/denies   Multiple Pain Sites No         Goals Met:  Proper associated with RPD/PD & O2 Sat Independence with exercise equipment Exercise tolerated well No report of cardiac concerns or symptoms Strength training completed today  Goals Unmet:  Not Applicable  Comments: Pt able to follow exercise prescription today without complaint.  Will continue to monitor for progression.   Dr. Emily Filbert is Medical Director for [place] and [facility].

## 2016-10-03 NOTE — Patient Outreach (Signed)
[facility] Care Management  10/03/2016  KIMBER MENNE [birth year] [id number]  RN Health Coach  Attempted #1 outreach  call to patient.  Patient was unavailable. No voice mail pickup. Plan: RN will call patient again within 14 days.   [place] Care Management [phone]

## 2016-10-04 ENCOUNTER — Other Ambulatory Visit: Payer: Self-pay

## 2016-10-04 ENCOUNTER — Telehealth: Payer: Self-pay | Admitting: Cardiovascular Disease

## 2016-10-04 ENCOUNTER — Ambulatory Visit: Payer: Self-pay | Admitting: *Deleted

## 2016-10-04 DIAGNOSIS — I5021 Acute systolic (congestive) heart failure: Secondary | ICD-10-CM

## 2016-10-04 MED ORDER — BAYER MICROLET LANCETS MISC: 5 refills | Status: DC

## 2016-10-04 MED ORDER — POTASSIUM CHLORIDE ER 10 MEQ PO TBCR: 10.0000 meq | EXTENDED_RELEASE_TABLET | Freq: Every day | ORAL | 3 refills | Status: DC

## 2016-10-04 MED ORDER — GLUCOSE BLOOD VI STRP: ORAL_STRIP | 5 refills | Status: DC

## 2016-10-04 NOTE — Telephone Encounter (Signed)
Increase torsemide to 40 mg today, then decrease to 20 mg daily on 1/4 with KCl 20 meq today followed by 10 meq starting on 1/4. Renal function from 11/2 showed a SCr of 1.50 with a baseline of approximately 1.1-1.2. Check bmet on Friday, 1/5 to assess renal function and potassium as well as to provide further guidance on torsemide frequency.

## 2016-10-04 NOTE — Telephone Encounter (Signed)
Reviewed recommendations w/pt who verbalized understanding and repeated instructions back. He will have BMET Friday at the [facility] before noon so it will result and Thurmond Butts can further advise before the weekend. Prescriptions submitted to [place].

## 2016-10-04 NOTE — Telephone Encounter (Signed)
Pt c/o swelling: STAT is pt has developed SOB within 24 hours  1. How long have you been experiencing swelling? Has been going on for a while  2. Where is the swelling located? Some in right leg and abdomen  3.  Are you currently taking a "fluid pill"? yes  4.  Are you currently SOB? Shorter than has been in the last 2 days  5.  Have you traveled recently?no   States pt weight is up 8 pounds from baseline

## 2016-10-04 NOTE — Progress Notes (Unsigned)
[id number]  

## 2016-10-04 NOTE — Telephone Encounter (Signed)
Pt reports abdominal swelling and an 8lb weight gain. Right leg swelling is the same as it has been since his October CABG. Pt asked I s/w his wife. She reports base weight is 220lbs. He weighs himself daily, follows low sodium diet and is compliant w/medications. Dec 22    220lbs Dec 23-Jan 2   224lbs Today    228.5lbs   Pt takes torsemide 20mg  qod with a dose today.  Medication lists torsemide qd.  He was advised by Dr. Prescott Gum @ 12/13 OV to decrease torsemide to qod. Since then, he has noticed increase in weight.  Last BMET 08/03/16  They deny any changes to his diet as she is "very careful what I cook". Pt appetite has decreased over the past few days. He was unable to do as much in cardiac rehab as he has been d/t SOB. Per Nov OV notes, pt needs echo to to reassess LV fxn following revascularization and determine candidacy for ICD therapy. He is agreeable to 1/4 @ 2pm.   Will forward to [employer], PA-C to further review and advise regarding diuretic.

## 2016-10-04 NOTE — Telephone Encounter (Signed)
Pt request refill microlet lancets and contour next test strips to [facility]. Advised done per protocol. Pt last seen 07/2016.

## 2016-10-05 ENCOUNTER — Ambulatory Visit (INDEPENDENT_AMBULATORY_CARE_PROVIDER_SITE_OTHER): Payer: PPO

## 2016-10-05 ENCOUNTER — Other Ambulatory Visit: Payer: Self-pay

## 2016-10-05 DIAGNOSIS — I5021 Acute systolic (congestive) heart failure: Secondary | ICD-10-CM

## 2016-10-06 ENCOUNTER — Other Ambulatory Visit
Admission: RE | Admit: 2016-10-06 | Discharge: 2016-10-06 | Disposition: A | Discharge status: Home / Self Care | Payer: PPO | Source: Ambulatory Visit | Attending: Physician Assistant | Admitting: Physician Assistant

## 2016-10-06 ENCOUNTER — Other Ambulatory Visit: Payer: PPO

## 2016-10-06 ENCOUNTER — Other Ambulatory Visit: Payer: Self-pay

## 2016-10-06 DIAGNOSIS — I5021 Acute systolic (congestive) heart failure: Secondary | ICD-10-CM | POA: Insufficient documentation

## 2016-10-06 LAB — BASIC METABOLIC PANEL
ANION GAP: 7 (ref 5–15)
BUN: 29 mg/dL — ABNORMAL HIGH (ref 6–20)
CALCIUM: 9.5 mg/dL (ref 8.9–10.3)
CO2: 28 mmol/L (ref 22–32)
Chloride: 104 mmol/L (ref 101–111)
Creatinine, Ser: 1.55 mg/dL — ABNORMAL HIGH (ref 0.61–1.24)
GFR, EST AFRICAN AMERICAN: 51 mL/min — AB (ref >60)
GFR, EST NON AFRICAN AMERICAN: 44 mL/min — AB (ref >60)
Glucose, Bld: 93 mg/dL (ref 65–99)
POTASSIUM: 4.2 mmol/L (ref 3.5–5.1)
Sodium: 139 mmol/L (ref 135–145)

## 2016-10-06 NOTE — Telephone Encounter (Signed)
Patient had an echo and says he was told not to exercise and is now very concerned.   Patient dropped off vs log from [facility].  Please call to discuss todays lab results, concerns s/p echo.

## 2016-10-06 NOTE — Telephone Encounter (Signed)
Reviewed echo and lab results w/pt. See phone note

## 2016-10-09 ENCOUNTER — Ambulatory Visit (INDEPENDENT_AMBULATORY_CARE_PROVIDER_SITE_OTHER): Payer: PPO | Admitting: Cardiovascular Disease

## 2016-10-09 ENCOUNTER — Encounter: Payer: Self-pay | Admitting: Cardiovascular Disease

## 2016-10-09 VITALS — BP 112/72 | HR 83 | Ht 70.0 in

## 2016-10-09 DIAGNOSIS — I5022 Chronic systolic (congestive) heart failure: Secondary | ICD-10-CM | POA: Diagnosis not present

## 2016-10-09 DIAGNOSIS — I25119 Atherosclerotic heart disease of native coronary artery with unspecified angina pectoris: Secondary | ICD-10-CM | POA: Diagnosis not present

## 2016-10-09 DIAGNOSIS — I483 Typical atrial flutter: Secondary | ICD-10-CM

## 2016-10-09 DIAGNOSIS — I480 Paroxysmal atrial fibrillation: Secondary | ICD-10-CM | POA: Diagnosis not present

## 2016-10-09 DIAGNOSIS — G4733 Obstructive sleep apnea (adult) (pediatric): Secondary | ICD-10-CM | POA: Diagnosis not present

## 2016-10-09 MED ORDER — TORSEMIDE 20 MG PO TABS: 20.0000 mg | ORAL_TABLET | Freq: Every day | ORAL | 2 refills | Status: DC

## 2016-10-09 NOTE — Progress Notes (Signed)
Cardiology Office Note   Date:  10/09/2016   ID:  Tommy Sullivan, DOB 04/05/1946, MRN [id number]  PCP:  Eliezer Lofts, MD  Cardiologist:   Kathlyn Sacramento, MD   Chief Complaint  Patient presents with  . other    2 month follow up as well as discuss Echo. Meds reviewed by the pt. verbally. "doing well."       History of Present Illness: Tommy Sullivan is a 71 y.o. male who presents for a follow-up visit regarding coronary artery disease, chronic systolic heart faiure and atrial flutter status post ablation.  He has known history of coronary artery disease with previous myocardial infarction  in 2009 with PCI done by Dr. Collene Mares. He has other chronic medical conditions that include type 2 diabetes, hypertension and hyperlipidemia.   He underwent atrial flutter ablation in August 2015 by Dr. Caryl Comes. He does have sleep apnea and uses a CPAP on a regular basis. He was seen in October for significant worsening of exertional dyspnea. He underwent a stress test which showed evidence of prior inferior infarct with a decrease in ejection fraction from before. I proceeded with cardiac catheterization which showed severe three-vessel coronary artery disease with an ejection fraction of 45%. The patient underwent CABG by Dr. Lucianne Lei trigt. He had postoperative atrial fibrillation which was successfully treated with amiodarone. The patient developed volume overload that required continuation of torsemide. He has been attending cardiac rehabilitation but reports continued symptoms of exertional dyspnea with no chest pain, orthopnea or PND. He continues to have mild leg edema. Further diuresis has been limited by mild worsening of renal function with a creatinine 1.5. He had an echocardiogram done in October after CABG which showed a drop in LV systolic function with an EF of 30-35%. He had a repeat echocardiogram this month which showed an EF of 40-45%, moderate mitral regurgitation, moderate to severe  tricuspid regurgitation with peak systolic pressure of 41 mmHg   Past Medical History:  Diagnosis Date  . Allergy   . Arthritis   . Atrial flutter ([place])    a/ 05/2014 s/p RFCA.  Marland Kitchen CAD (coronary artery disease) 10/2007   a. 10/2007 s/p MI/PCI of RCA [facility] - Dr. Collene Mares);  b. 06/2016 MV: EF 34%, inf/inflat infarct; c. 07/2016 Cath: LM nl, LAd 90ost/prox, 41m, 99d, D2 30, RI small, LCX 142m, Om1 40, RCA 50ost, 5 ISR, 10m, 30d, RPDA  80ost, RPL2 70, EF 45-50%;  d. 07/2016 CABG x 5 (LIMA->LAD, VG->Diag, VG->RI, VG->PDA->RPL).  . Chronic systolic CHF (congestive heart failure) ([place])    a. 07/2016 Echo: Ef 30-35%.  . Diabetes mellitus without complication ([place])   . Extrinsic asthma, unspecified   . Glaucoma   . Gout   . Hyperlipidemia   . Hypertensive heart disease   . Ischemic cardiomyopathy    a. 07/2016 Echo: Ef 30-35%, mildly dil LA, mild MR/TR, PASP 24 mmHg.  Marland Kitchen Post-op Afib following CABG (07/2016)    a. 07/2016 - placed on amio post-op.  Marland Kitchen Retinal vascular occlusion, unspecified    Right  . Sleep apnea     Past Surgical History:  Procedure Laterality Date  . ABLATION  05-07-2014   EPS and ablation of atrial flutter by Dr Caryl Comes  . APPENDECTOMY  1954  . ATRIAL FLUTTER ABLATION N/A 05/07/2014   Procedure: ATRIAL FLUTTER ABLATION;  Surgeon: Deboraha Sprang, MD;  Location: [facility] CATH LAB;  Service: Cardiovascular;  Laterality: N/A;  . CARDIAC CATHETERIZATION Left 07/10/2016  Procedure: Left Heart Cath and Coronary Angiography;  Surgeon: Wellington Hampshire, MD;  Location: [place] CV LAB;  Service: Cardiovascular;  Laterality: Left;  . COLONOSCOPY    . CORONARY ANGIOPLASTY  2009   stent [place]  . CORONARY ARTERY BYPASS GRAFT N/A 07/14/2016   Procedure: CORONARY ARTERY BYPASS GRAFTING (CABG), ON PUMP, TIMES FIVE, USING LEFT INTERNAL MAMMARY ARTERY, RIGHT GREATER SAPHENOUS VEIN HARVESTED ENDOSCOPICALLY;  Surgeon: Ivin Poot, MD;  Location: [place];  Service: Open Heart Surgery;   Laterality: N/A;  SVG to PD and PL sequential SVG to 1st diagonal SVG to [place] LIMA to LAD  . NECK SURGERY  1990's   x2, Fusion  . SHOULDER ARTHROSCOPY WITH SUBACROMIAL DECOMPRESSION Left 04/02/2014   Procedure: SHOULDER ARTHROSCOPY WITH SUBACROMIAL DECOMPRESSION WITH ROTATOR CUFF REPAIR;  Surgeon: Nita Sells, MD;  Location: [place];  Service: Orthopedics;  Laterality: Left;  Left shoulder rotator cuff repair, subacromial decompression  . SKIN SURGERY    . TEE WITHOUT CARDIOVERSION N/A 07/14/2016   Procedure: TRANSESOPHAGEAL ECHOCARDIOGRAM (TEE);  Surgeon: Ivin Poot, MD;  Location: [place];  Service: Open Heart Surgery;  Laterality: N/A;  . TONSILLECTOMY       Current Outpatient Prescriptions  Medication Sig Dispense Refill  . aspirin EC 325 MG EC tablet Take 1 tablet (325 mg total) by mouth daily. 30 tablet 0  . BAYER MICROLET LANCETS lancets Check blood sugar twice a day and as directed. Dx E11.9 100 each 5  . Cholecalciferol (VITAMIN D3) 1000 units CAPS Take 1,000 Units by mouth daily.     . fenofibrate 160 MG tablet TAKE 1 TABLET BY MOUTH DAILY 90 tablet 1  . glucose blood (BAYER CONTOUR NEXT TEST) test strip Use to check blood sugar two times daily.  Dx: E11.9 100 each 5  . insulin glargine (LANTUS) 100 UNIT/ML injection Inject 8 Units into the skin 2 (two) times daily.     . Insulin Pen Needle (BD PEN NEEDLE NANO U/F) 32G X 4 MM MISC Use to inject lantus twice a day and as instructed. Dx E11.9 100 each 6  . latanoprost (XALATAN) 0.005 % ophthalmic solution Place 1 drop into the left eye at bedtime.   3  . metoprolol tartrate (LOPRESSOR) 25 MG tablet Take 12.5 mg by mouth 2 (two) times daily.    . modafinil (PROVIGIL) 200 MG tablet TAKE 1 TABLET BY MOUTH ONCE DAILY 90 tablet 3  . Omega-3 Fatty Acids (FISH OIL) 1000 MG CAPS Take 2,000 capsules by mouth 2 (two) times daily.     . potassium chloride (K-DUR) 10 MEQ tablet Take 1 tablet (10 mEq total) by  mouth daily. 30 tablet 3  . rosuvastatin (CRESTOR) 10 MG tablet TAKE 1 TABLET BY MOUTH DAILY. (Patient taking differently: Take 10 mg by mouth daily) 90 tablet 1  . tamsulosin (FLOMAX) 0.4 MG CAPS capsule TAKE 1 CAPSULE BY MOUTH DAILY (Patient taking differently: Take 0.4 mg by mouth in the every other day) 90 capsule 1  . torsemide (DEMADEX) 20 MG tablet Take 20 mg by mouth daily.     No current facility-administered medications for this visit.     Allergies:   No known allergies    Social History:  The patient  reports that he quit smoking about 28 years ago. His smoking use included Cigarettes. He has a 20.00 pack-year smoking history. He has never used smokeless tobacco. He reports that he drinks about 1.8 oz of  alcohol per week . He reports that he does not use drugs.   Family History:  The patient's family history includes AAA (abdominal aortic aneurysm) in his father; Coronary artery disease in his brother; Diabetes in his brother, mother, and sister; Heart disease in his mother.    ROS:  Please see the history of present illness.   Otherwise, review of systems are positive for none.   All other systems are reviewed and negative.    PHYSICAL EXAM: VS:  BP 112/72 (BP Location: Left Arm, Patient Position: Sitting, Cuff Size: Normal)   Pulse 83   Ht 5\' 10"  (1.778 m)  , BMI There is no height or weight on file to calculate BMI. GEN: Well nourished, well developed, in no acute distress  HEENT: normal  Neck: no JVD, carotid bruits, or masses Cardiac: RRR; no murmurs, rubs, or gallops, mild bilateral leg edema worse on the right Respiratory:  clear to auscultation bilaterally, normal work of breathing GI: soft, nontender, nondistended, + BS MS: no deformity or atrophy  Skin: warm and dry, no rash Neuro:  Strength and sensation are intact Psych: euthymic mood, full affect   EKG:  EKG is ordered today. The ekg ordered today demonstrates : Normal sinus rhythm with old anteroseptal  infarct and old inferior infarct. QRS widening. Left bundle branch block morphology.   Recent Labs: 07/11/2016: ALT 18; TSH 3.712 07/19/2016: Magnesium 2.2 08/03/2016: Brain Natriuretic Peptide 485.2; Hemoglobin 11.6; Platelets 267 10/06/2016: BUN 29; Creatinine, Ser 1.55; Potassium 4.2; Sodium 139    Lipid Panel    Component Value Date/Time   CHOL 108 02/24/2016 0938   TRIG 172.0 (H) 02/24/2016 0938   HDL 24.70 (L) 02/24/2016 0938   CHOLHDL 4 02/24/2016 0938   VLDL 34.4 02/24/2016 0938   LDLCALC 49 02/24/2016 0938   LDLDIRECT 71.0 11/24/2015 0831      Wt Readings from Last 3 Encounters:  09/13/16 221 lb 8 oz (100.5 kg)  08/28/16 223 lb (101.2 kg)  08/16/16 225 lb (102.1 kg)      No flowsheet data found.    ASSESSMENT AND PLAN:  1.  Coronary artery disease involving native coronary arteries with other forms of angina:  The patient is doing reasonably well post CABG for severe three-vessel coronary artery disease. However, his recovery has been very slow and he has not been able to improve his exercise capacity significantly. I advised him to resume cardiac rehabilitation. It is reassuring that his ejection fraction improved on the most recent echo to 40-45%.  2. Chronic systolic heart failure: He appears to be mildly volume overloaded but not able to increase the dose of torsemide at the present time due to mild worsening of renal function. Repeat basic metabolic profile in few weeks. I will consider switching metoprolol to carvedilol and consider resuming losartan especially that most recent echocardiogram showed moderate mitral regurgitation and moderate to severe tricuspid regurgitation.  3. History of atrial flutter status post RF ablation in 2015: No evidence of recurrent arrhythmia.  3. Essential hypertension:  The pressure is controlled  4. Hyperlipidemia:  Currently on rosuvastatin, fenofibrate and fish oil. I requested lipid and liver profile.  6. Diabetes  mellitus: Managed by his primary care physician .  Disposition:   FU with me in 3 months  Signed,  Kathlyn Sacramento, MD  10/09/2016 2:00 PM    [place]

## 2016-10-09 NOTE — Patient Instructions (Signed)
Medication Instructions:  Your physician recommends that you continue on your current medications as directed. Please refer to the Current Medication list given to you today.   Labwork: BMET, lipid and liver profile at PCP in February  Testing/Procedures: none  Follow-Up: Your physician recommends that you schedule a follow-up appointment in: 3 months with Dr. Fletcher Anon.    Any Other Special Instructions Will Be Listed Below (If Applicable).     If you need a refill on your cardiac medications before your next appointment, please call your pharmacy.

## 2016-10-10 ENCOUNTER — Encounter: Payer: PPO | Admitting: *Deleted

## 2016-10-10 DIAGNOSIS — Z951 Presence of aortocoronary bypass graft: Secondary | ICD-10-CM | POA: Diagnosis not present

## 2016-10-10 NOTE — Progress Notes (Signed)
Daily Session Note  Patient Details  Name: Tommy Sullivan MRN: [id number] Date of Birth: 06-09-1946 Referring Provider:   Flowsheet Row Cardiac Rehab from 08/28/2016 in [facility] Cardiac and Pulmonary Rehab  Referring Provider  Kathlyn Sacramento MD      Encounter Date: 10/10/2016  Check In:     Session Check In - 10/10/16 0810      Check-In   Location [facility]-Cardiac & Pulmonary Rehab   Staff Present Alberteen Sam, MA, ACSM RCEP, Exercise Physiologist;Susanne Bice, RN, BSN, CCRP;Laureen Owens Shark, BS, RRT, Respiratory Therapist   Supervising physician immediately available to respond to emergencies See telemetry face sheet for immediately available ER MD   Medication changes reported     No   Fall or balance concerns reported    No   Warm-up and Cool-down Performed on first and last piece of equipment   Resistance Training Performed Yes   VAD Patient? No     Pain Assessment   Currently in Pain? No/denies   Multiple Pain Sites No         Goals Met:  Independence with exercise equipment Exercise tolerated well No report of cardiac concerns or symptoms Strength training completed today  Goals Unmet:  Not Applicable  Comments:  Pt able to follow exercise prescription today without complaint.  Will continue to monitor for progression.     Dr. Emily Filbert is Medical Director for [place] and [facility].

## 2016-10-11 ENCOUNTER — Telehealth: Payer: Self-pay | Admitting: Internal Medicine

## 2016-10-11 NOTE — Telephone Encounter (Signed)
PA initiated for pt's modafinil.  This has been sent to clinical review and a response should be faxed to office within 45 hours.  Will forward to KW to follow up on.

## 2016-10-12 DIAGNOSIS — Z951 Presence of aortocoronary bypass graft: Secondary | ICD-10-CM | POA: Diagnosis not present

## 2016-10-12 NOTE — Progress Notes (Signed)
Daily Session Note  Patient Details  Name: SHER HELLINGER MRN: [id number] Date of Birth: 02/26/1946 Referring Provider:   Flowsheet Row Cardiac Rehab from 08/28/2016 in [facility] Cardiac and Pulmonary Rehab  Referring Provider  Kathlyn Sacramento MD      Encounter Date: 10/12/2016  Check In:     Session Check In - 10/12/16 0836      Check-In   Location [facility]-Cardiac & Pulmonary Rehab   Staff Present Alberteen Sam, MA, ACSM RCEP, Exercise Physiologist;Patricia Surles RN Vickki Hearing, BA, ACSM CEP, Exercise Physiologist   Supervising physician immediately available to respond to emergencies See telemetry face sheet for immediately available ER MD   Medication changes reported     No   Fall or balance concerns reported    No   Warm-up and Cool-down Performed on first and last piece of equipment   Resistance Training Performed Yes   VAD Patient? No     Pain Assessment   Currently in Pain? No/denies   Multiple Pain Sites No         Goals Met:  Independence with exercise equipment Exercise tolerated well No report of cardiac concerns or symptoms Strength training completed today  Goals Unmet:  Not Applicable  Comments: Pt able to follow exercise prescription today without complaint.  Will continue to monitor for progression.    Dr. Emily Filbert is Medical Director for [place] and [facility].

## 2016-10-12 NOTE — Telephone Encounter (Signed)
Determination has not be received yet.  Will route to Katie to f/u on.

## 2016-10-13 ENCOUNTER — Telehealth: Payer: Self-pay

## 2016-10-13 MED ORDER — ONETOUCH VERIO IQ SYSTEM W/DEVICE KIT: PACK | 0 refills | Status: DC

## 2016-10-13 MED ORDER — GLUCOSE BLOOD VI STRP: ORAL_STRIP | 5 refills | Status: DC

## 2016-10-13 MED ORDER — ONETOUCH LANCETS MISC: 5 refills | Status: DC

## 2016-10-13 NOTE — Telephone Encounter (Signed)
PA is still pending as of today:

## 2016-10-13 NOTE — Telephone Encounter (Signed)
Pt has changed ins co and pts present diabetic meter and testing supplies are not covered. Pt checked and the one touch verio IQ is approved.pt request sent to [facility]. Marjory Lies at [facility] said for the verio IQ device to send the verio test strips and misc lancets and they will supply pt with correct supplies. Pt notified done and pt will ck with [facility] later for pick up. FYI to Dr Diona Browner.

## 2016-10-16 NOTE — Telephone Encounter (Signed)
lmtcb X1 for [facility] pharmacy to make aware that this has been approved.

## 2016-10-16 NOTE — Telephone Encounter (Signed)
PA approval received via fax for Modafinil 200 mg 30 tablets every 30 days. Approval from 10/12/16 through 10/01/17.

## 2016-10-17 ENCOUNTER — Encounter: Payer: PPO | Admitting: Respiratory Therapy

## 2016-10-17 DIAGNOSIS — Z951 Presence of aortocoronary bypass graft: Secondary | ICD-10-CM | POA: Diagnosis not present

## 2016-10-17 NOTE — Progress Notes (Signed)
Daily Session Note  Patient Details  Name: Tommy Sullivan MRN: [id number] Date of Birth: [birth year] Referring Provider:   Flowsheet Row Cardiac Rehab from 08/28/2016 in [facility] Cardiac and Pulmonary Rehab  Referring Provider  Kathlyn Sacramento MD      Encounter Date: 10/17/2016  Check In:     Session Check In - 10/17/16 0845      Check-In   Location [facility]-Cardiac & Pulmonary Rehab   Staff Present Alberteen Sam, MA, ACSM RCEP, Exercise Physiologist;Susanne Bice, RN, BSN, CCRP;Laureen Owens Shark, BS, RRT, Respiratory Therapist   Supervising physician immediately available to respond to emergencies See telemetry face sheet for immediately available ER MD   Medication changes reported     No   Fall or balance concerns reported    No   Warm-up and Cool-down Performed on first and last piece of equipment   Resistance Training Performed Yes   VAD Patient? No     Pain Assessment   Currently in Pain? No/denies   Multiple Pain Sites No         Goals Met:  Proper associated with RPD/PD & O2 Sat Independence with exercise equipment Exercise tolerated well No report of cardiac concerns or symptoms Strength training completed today  Goals Unmet:  Not Applicable  Comments: Pt able to follow exercise prescription today without complaint.  Will continue to monitor for progression.   Dr. Emily Filbert is Medical Director for [place] and [facility].

## 2016-10-17 NOTE — Telephone Encounter (Signed)
lmtcb x2 for [facility].

## 2016-10-20 NOTE — Telephone Encounter (Signed)
LMTCB

## 2016-10-23 NOTE — Telephone Encounter (Signed)
Called [facility] and they stated that they received the same approval that we have so they will get this out to the pt.  Nothing further is needed.

## 2016-10-24 ENCOUNTER — Encounter: Payer: PPO | Admitting: Respiratory Therapy

## 2016-10-24 DIAGNOSIS — Z951 Presence of aortocoronary bypass graft: Secondary | ICD-10-CM

## 2016-10-24 NOTE — Progress Notes (Signed)
Daily Session Note  Patient Details  Name: Tommy Sullivan MRN: [id number] Date of Birth: 1946/05/19 Referring Provider:   Flowsheet Row Cardiac Rehab from 08/28/2016 in [facility] Cardiac and Pulmonary Rehab  Referring Provider  Kathlyn Sacramento MD      Encounter Date: 10/24/2016  Check In:     Session Check In - 10/24/16 0941      Check-In   Location [facility]-Cardiac & Pulmonary Rehab   Staff Present Alberteen Sam, MA, ACSM RCEP, Exercise Physiologist;Susanne Bice, RN, BSN, CCRP;Laureen Owens Shark, BS, RRT, Respiratory Therapist   Supervising physician immediately available to respond to emergencies See telemetry face sheet for immediately available ER MD   Medication changes reported     No   Fall or balance concerns reported    No   Warm-up and Cool-down Performed on first and last piece of equipment   Resistance Training Performed Yes   VAD Patient? No     Pain Assessment   Currently in Pain? No/denies   Multiple Pain Sites No         Goals Met:  Proper associated with RPD/PD & O2 Sat Independence with exercise equipment Exercise tolerated well No report of cardiac concerns or symptoms Strength training completed today  Goals Unmet:  Not Applicable  Comments: Pt able to follow exercise prescription today without complaint.  Will continue to monitor for progression.   Dr. Emily Filbert is Medical Director for [place] and [facility].

## 2016-10-25 ENCOUNTER — Telehealth: Payer: Self-pay | Admitting: Family Medicine

## 2016-10-25 ENCOUNTER — Ambulatory Visit: Payer: PPO | Admitting: Cardiothoracic Surgery

## 2016-10-25 ENCOUNTER — Encounter: Payer: Self-pay | Admitting: Cardiothoracic Surgery

## 2016-10-25 ENCOUNTER — Ambulatory Visit (INDEPENDENT_AMBULATORY_CARE_PROVIDER_SITE_OTHER): Payer: PPO | Admitting: Cardiothoracic Surgery

## 2016-10-25 VITALS — BP 86/64 | HR 83 | Resp 20 | Ht 69.5 in | Wt 232.5 lb

## 2016-10-25 DIAGNOSIS — I2510 Atherosclerotic heart disease of native coronary artery without angina pectoris: Secondary | ICD-10-CM

## 2016-10-25 DIAGNOSIS — Z951 Presence of aortocoronary bypass graft: Secondary | ICD-10-CM

## 2016-10-25 DIAGNOSIS — E119 Type 2 diabetes mellitus without complications: Secondary | ICD-10-CM

## 2016-10-25 NOTE — Telephone Encounter (Signed)
-----   Message from Ellamae Sia sent at 10/24/2016  4:00 PM EST ----- Regarding: lab orders for Thursday, 2.1.18 Lab orders for a DM f/u

## 2016-10-25 NOTE — Progress Notes (Signed)
PCP is Eliezer Lofts, MD Referring Provider is Wellington Hampshire, MD  Chief Complaint  Patient presents with  . Routine Post Op    6 week f/u     HPI: Patient returns for follow-up 4 months after multivessel CABG for ischemic cardiomyopathy, elevated filling pressures, symptoms of CHF and poorly controlled diabetes. The patient's preoperative ejection fraction was approximately 40%. He has had problems with edema and dyspnea on exertion and fatigue postop. Most recent echo is reviewed showing LVH, EF 40-45 percent, mild-moderate MR, moderate TR. The patient has chronic lung disease and sleep apnea followed by Dr. Baird Lyons. He is participating in cardiac rehabilitation in [place]. He has had his medications adjusted including diuretic dosing but still has significant complaints of edema dyspnea on exertion and fatigue. He is in sinus rhythm. He would benefit from evaluation and titration of medications at the advanced heart failure clinic and we will make that referral.  Last chest x-ray showed her lung fields no pleural effusion.  The patient also complains of right thigh and hip claudication-type pain. His resting ABIs prior to surgery were 0.9-1.0. Will obtain breast and exercise ABIs at the vascular lab.  The patient is currently taking torsemide 20 mg daily but has started gaining weight with increased pedal edema. I'll add Aldactone 25 mg daily and have him stop his potassium. The patient is frustrated with his slow progress after surgery and would benefit from evaluation and titration of medications at the advanced heart failure clinic.  Past Medical History:  Diagnosis Date  . Allergy   . Arthritis   . Atrial flutter ([place])    a/ 05/2014 s/p RFCA.  Marland Kitchen CAD (coronary artery disease) 10/2007   a. 10/2007 s/p MI/PCI of R[facility] - Dr. Collene Mares);  b. 06/2016 MV: EF 34%, inf/inflat infarct; c. 07/2016 Cath: LM nl, LAd 90ost/prox, 32m 99d, D2 30, RI small, LCX 1017mOm1 40, RCA 50ost, 5  ISR, 9064m0d, RPDA  80ost, RPL2 70, EF 45-50%;  d. 07/2016 CABG x 5 (LIMA->LAD, VG->Diag, VG->RI, VG->PDA->RPL).  . Chronic systolic CHF (congestive heart failure) ([facility]  a. 07/2016 Echo: Ef 30-35%.  . Diabetes mellitus without complication ([facility] . Extrinsic asthma, unspecified   . Glaucoma   . Gout   . Hyperlipidemia   . Hypertensive heart disease   . Ischemic cardiomyopathy    a. 07/2016 Echo: Ef 30-35%, mildly dil LA, mild MR/TR, PASP 24 mmHg.  . 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 07/2016 - placed on amio post-op.  . RMarland Kitchentinal vascular occlusion, unspecified    Right  . Sleep apnea     Past Surgical History:  Procedure Laterality Date  . ABLATION  05-07-2014   EPS and ablation of atrial flutter by Dr KleCaryl Comes APPENDECTOMY  1954  . ATRIAL FLUTTER ABLATION N/A 05/07/2014   Procedure: ATRIAL FLUTTER ABLATION;  Surgeon: SteDeboraha SprangD;  [facility];  Service: Cardiovascular;  Laterality: N/A;  . CARDIAC CATHETERIZATION Left 07/10/2016   Procedure: Left Heart Cath and Coronary Angiography;  Surgeon: MuhWellington HampshireD;  [facility];  Service: Cardiovascular;  Laterality: Left;  . COLONOSCOPY    . CORONARY ANGIOPLASTY  2009   stent [place] CORONARY ARTERY BYPASS GRAFT N/A 07/14/2016   Procedure: CORONARY ARTERY BYPASS GRAFTING (CABG), ON PUMP, TIMES FIVE, USING LEFT INTERNAL MAMMARY ARTERY, RIGHT GREATER SAPHENOUS VEIN HARVESTED ENDOSCOPICALLY;  Surgeon: PetTharon Aquas  Kerby Less, MD;  Location: [place];  Service: Open Heart Surgery;  Laterality: N/A;  SVG to PD and PL sequential SVG to 1st diagonal SVG to Ramus LIMA to LAD  . NECK SURGERY  1990's   x2, Fusion  . SHOULDER ARTHROSCOPY WITH SUBACROMIAL DECOMPRESSION Left 04/02/2014   Procedure: SHOULDER ARTHROSCOPY WITH SUBACROMIAL DECOMPRESSION WITH ROTATOR CUFF REPAIR;  Surgeon: Nita Sells, MD;  Location: [place];  Service: Orthopedics;  Laterality: Left;  Left  shoulder rotator cuff repair, subacromial decompression  . SKIN SURGERY    . TEE WITHOUT CARDIOVERSION N/A 07/14/2016   Procedure: TRANSESOPHAGEAL ECHOCARDIOGRAM (TEE);  Surgeon: Ivin Poot, MD;  Location: [place];  Service: Open Heart Surgery;  Laterality: N/A;  . TONSILLECTOMY      Family History  Problem Relation Age of Onset  . Diabetes Sister   . Coronary artery disease Brother   . AAA (abdominal aortic aneurysm) Father   . Heart disease Mother   . Diabetes Mother   . Diabetes Brother   . Colon cancer Neg Hx     Social History Social History  Substance Use Topics  . Smoking status: Former Smoker    Packs/day: 1.00    Years: 20.00    Types: Cigarettes    Quit date: 10/02/1988  . Smokeless tobacco: Never Used     Comment: quit in the 1990's  . Alcohol use 1.8 oz/week    3 Cans of beer per week     Comment: 3-4 drinks per week.     Current Outpatient Prescriptions  Medication Sig Dispense Refill  . aspirin EC 325 MG EC tablet Take 1 tablet (325 mg total) by mouth daily. 30 tablet 0  . Blood Glucose Monitoring Suppl (ONETOUCH VERIO IQ SYSTEM) w/Device KIT Check blood sugar twice a day and as directed. Dx E11.9 1 kit 0  . Cholecalciferol (VITAMIN D3) 1000 units CAPS Take 1,000 Units by mouth daily.     . fenofibrate 160 MG tablet TAKE 1 TABLET BY MOUTH DAILY 90 tablet 1  . glucose blood (ONETOUCH VERIO) test strip Check blood sugar twice a day and as directed. Dx E11.9 100 each 5  . insulin glargine (LANTUS) 100 UNIT/ML injection Inject 8 Units into the skin 2 (two) times daily.     . Insulin Pen Needle (BD PEN NEEDLE NANO U/F) 32G X 4 MM MISC Use to inject lantus twice a day and as instructed. Dx E11.9 100 each 6  . latanoprost (XALATAN) 0.005 % ophthalmic solution Place 1 drop into the left eye at bedtime.   3  . metoprolol tartrate (LOPRESSOR) 25 MG tablet Take 12.5 mg by mouth 2 (two) times daily.    . modafinil (PROVIGIL) 200 MG tablet TAKE 1 TABLET BY MOUTH ONCE  DAILY 90 tablet 3  . Omega-3 Fatty Acids (FISH OIL) 1000 MG CAPS Take 2,000 capsules by mouth 2 (two) times daily.     . ONE TOUCH LANCETS MISC Check blood sugar twice a day and as directed. Dx E11.9 100 each 5  . potassium chloride (K-DUR) 10 MEQ tablet Take 1 tablet (10 mEq total) by mouth daily. 30 tablet 3  . rosuvastatin (CRESTOR) 10 MG tablet TAKE 1 TABLET BY MOUTH DAILY. (Patient taking differently: Take 10 mg by mouth daily) 90 tablet 1  . tamsulosin (FLOMAX) 0.4 MG CAPS capsule TAKE 1 CAPSULE BY MOUTH DAILY (Patient taking differently: Take 0.4 mg by mouth in the every other day) 90 capsule 1  .  torsemide (DEMADEX) 20 MG tablet Take 1 tablet (20 mg total) by mouth daily. 90 tablet 2   No current facility-administered medications for this visit.     Allergies  Allergen Reactions  . No Known Allergies     Review of Systems  Weight stable to slightly increased No fever Complains of ankle edema right greater than left Complains right thigh and hip claudication-type pain No problems with sternal incision  BP (!) 86/64   Pulse 83   Resp 20   Ht 5' 9.5" (1.765 m)   Wt 232 lb 8 oz (105.5 kg)   SpO2 95% Comment: RA  BMI 33.84 kg/m  Physical Exam      Exam    General- alert and comfortable   Lungs- clear without rales, wheezes   Cor- regular rate and rhythm, no murmur , gallop   Abdomen- soft, non-tender   Extremities - warm, non-tender, 2+ edema   Neuro- oriented, appropriate, no focal weakness   Diagnostic Tests: Most recent echocardiogram performed January 2018 personally reviewed and counseled with patient. This shows slight improvement of global LV function now 40-45 percent with LVH and mild-moderate MR and moderate TR  Impression: Persistent problems of heart failure after CABG Refer to the advanced heart failure clinic  Plan: Patient return in 2 months for review of progress  Len Childs, MD [facility] [phone]

## 2016-10-26 ENCOUNTER — Encounter: Payer: PPO | Admitting: *Deleted

## 2016-10-26 ENCOUNTER — Other Ambulatory Visit: Payer: Self-pay | Admitting: *Deleted

## 2016-10-26 DIAGNOSIS — I5022 Chronic systolic (congestive) heart failure: Secondary | ICD-10-CM

## 2016-10-26 DIAGNOSIS — Z951 Presence of aortocoronary bypass graft: Secondary | ICD-10-CM | POA: Diagnosis not present

## 2016-10-26 DIAGNOSIS — I739 Peripheral vascular disease, unspecified: Secondary | ICD-10-CM

## 2016-10-26 NOTE — Progress Notes (Signed)
Daily Session Note  Patient Details  Name: Tommy Sullivan MRN: [id number] Date of Birth: Mar 24, 1946 Referring Provider:   Flowsheet Row Cardiac Rehab from 08/28/2016 in [facility] Cardiac and Pulmonary Rehab  Referring Provider  Kathlyn Sacramento MD      Encounter Date: 10/26/2016  Check In:     Session Check In - 10/26/16 0827      Check-In   Location [facility]-Cardiac & Pulmonary Rehab   Staff Present Alberteen Sam, MA, ACSM RCEP, Exercise Physiologist;Patricia Surles RN Vickki Hearing, BA, ACSM CEP, Exercise Physiologist   Supervising physician immediately available to respond to emergencies See telemetry face sheet for immediately available ER MD   Medication changes reported     Yes   Comments d/c'd potassium and started spironolactone 25 mg a day   Fall or balance concerns reported    No   Warm-up and Cool-down Performed on first and last piece of equipment   Resistance Training Performed Yes   VAD Patient? No     Pain Assessment   Currently in Pain? No/denies   Multiple Pain Sites No         Goals Met:  Independence with exercise equipment Exercise tolerated well Personal goals reviewed No report of cardiac concerns or symptoms Strength training completed today  Goals Unmet:  Not Applicable  Comments: Pt able to follow exercise prescription today without complaint.  Will continue to monitor for progression. See ITP for goal review   Dr. Emily Filbert is Medical Director for [place] and [facility].

## 2016-10-31 ENCOUNTER — Ambulatory Visit (HOSPITAL_COMMUNITY)
Admission: RE | Admit: 2016-10-31 | Discharge: 2016-10-31 | Disposition: A | Discharge status: Home / Self Care | Payer: PPO | Source: Ambulatory Visit | Attending: Cardiothoracic Surgery | Admitting: Cardiothoracic Surgery

## 2016-10-31 ENCOUNTER — Other Ambulatory Visit: Payer: Self-pay | Admitting: Cardiothoracic Surgery

## 2016-10-31 DIAGNOSIS — I5022 Chronic systolic (congestive) heart failure: Secondary | ICD-10-CM

## 2016-10-31 DIAGNOSIS — I739 Peripheral vascular disease, unspecified: Secondary | ICD-10-CM

## 2016-10-31 NOTE — Progress Notes (Addendum)
VASCULAR LAB PRELIMINARY  ARTERIAL  ABI completed:    RIGHT    LEFT    PRESSURE WAVEFORM  PRESSURE WAVEFORM  BRACHIAL 96  Triphasic BRACHIAL 86 Triphasic  AT 117 Biphasic DP 109 Trihasic  PT 132 Triphasic PT 121 Trihasic    RIGHT LEFT  ABI 1.38 1.26   ABIs and Doppler waveforms indicate normal arterial flow at rest bilaterally.  Patient attempted to completed the exercise portion of the exam but ws only able to walk on level ground for a period of 55 secs before becoming too short of breath to continue without resting. Pressures post this amount of exercise were right 120 with an ABI of 1.12. Left pressure was 112 with an ABI of 1.05  Exercise Portion was Inconclusive.  Deania Siguenza, RVS 10/31/2016, 12:56 PM

## 2016-11-01 ENCOUNTER — Encounter: Payer: Self-pay | Admitting: *Deleted

## 2016-11-01 ENCOUNTER — Ambulatory Visit (INDEPENDENT_AMBULATORY_CARE_PROVIDER_SITE_OTHER): Payer: PPO

## 2016-11-01 ENCOUNTER — Other Ambulatory Visit (INDEPENDENT_AMBULATORY_CARE_PROVIDER_SITE_OTHER): Payer: PPO

## 2016-11-01 VITALS — BP 94/62 | HR 88 | Temp 98.6°F | Ht 69.5 in | Wt 231.5 lb

## 2016-11-01 DIAGNOSIS — I25119 Atherosclerotic heart disease of native coronary artery with unspecified angina pectoris: Secondary | ICD-10-CM | POA: Diagnosis not present

## 2016-11-01 DIAGNOSIS — I5022 Chronic systolic (congestive) heart failure: Secondary | ICD-10-CM | POA: Diagnosis not present

## 2016-11-01 DIAGNOSIS — E119 Type 2 diabetes mellitus without complications: Secondary | ICD-10-CM

## 2016-11-01 DIAGNOSIS — Z951 Presence of aortocoronary bypass graft: Secondary | ICD-10-CM

## 2016-11-01 DIAGNOSIS — Z0000 Encounter for general adult medical examination without abnormal findings: Secondary | ICD-10-CM

## 2016-11-01 LAB — BASIC METABOLIC PANEL
BUN: 26 mg/dL — ABNORMAL HIGH (ref 6–23)
CALCIUM: 9.6 mg/dL (ref 8.4–10.5)
CO2: 27 meq/L (ref 19–32)
CREATININE: 1.41 mg/dL (ref 0.40–1.50)
Chloride: 102 mEq/L (ref 96–112)
GFR: 52.69 mL/min — AB (ref >60.00)
Glucose, Bld: 102 mg/dL — ABNORMAL HIGH (ref 70–99)
Potassium: 4.2 mEq/L (ref 3.5–5.1)
SODIUM: 136 meq/L (ref 135–145)

## 2016-11-01 LAB — HEPATIC FUNCTION PANEL
ALBUMIN: 4 g/dL (ref 3.5–5.2)
ALK PHOS: 45 U/L (ref 39–117)
ALT: 19 U/L (ref 0–53)
AST: 32 U/L (ref 0–37)
BILIRUBIN DIRECT: 0.6 mg/dL — AB (ref 0.0–0.3)
TOTAL PROTEIN: 7.3 g/dL (ref 6.0–8.3)
Total Bilirubin: 1.2 mg/dL (ref 0.2–1.2)

## 2016-11-01 LAB — LIPID PANEL
CHOL/HDL RATIO: 5
CHOLESTEROL: 95 mg/dL (ref 0–200)
HDL: 20 mg/dL — AB (ref >39.00)
LDL Cholesterol: 58 mg/dL (ref 0–99)
NonHDL: 75.25
TRIGLYCERIDES: 84 mg/dL (ref 0.0–149.0)
VLDL: 16.8 mg/dL (ref 0.0–40.0)

## 2016-11-01 LAB — MICROALBUMIN / CREATININE URINE RATIO
Creatinine,U: 32.1 mg/dL
MICROALB/CREAT RATIO: 10.6 mg/g (ref 0.0–30.0)
Microalb, Ur: 3.4 mg/dL — ABNORMAL HIGH (ref 0.0–1.9)

## 2016-11-01 LAB — HEMOGLOBIN A1C: HEMOGLOBIN A1C: 6.5 % (ref 4.6–6.5)

## 2016-11-01 NOTE — Addendum Note (Signed)
Addended by: Ellamae Sia on: 11/01/2016 08:10 AM   Modules accepted: Orders

## 2016-11-01 NOTE — Progress Notes (Signed)
I reviewed health advisor's note, was available for consultation, and agree with documentation and plan.   Signed,  Aleyza Salmi T. Demarkus Remmel, MD  

## 2016-11-01 NOTE — Patient Instructions (Signed)
Tommy Sullivan , Thank you for taking time to come for your Medicare Wellness Visit. I appreciate your ongoing commitment to your health goals. Please review the following plan we discussed and let me know if I can assist you in the future.   These are the goals we discussed: Goals    . Increase physical activity          Starting 11/01/2016, I will continue to exercise at least 30 min 5 days per week.        This is a list of the screening recommended for you and due dates:  Health Maintenance  Topic Date Due  . Shingles Vaccine  11/01/2025*  . Eye exam for diabetics  12/28/2016  . Complete foot exam   04/27/2017  . Hemoglobin A1C  05/01/2017  . Urine Protein Check  11/01/2017  . Tetanus Vaccine  03/03/2019  . Colon Cancer Screening  05/24/2025  . Flu Shot  Completed  .  Hepatitis C: One time screening is recommended by Center for Disease Control  (CDC) for  adults born from [birth year] through 1965.   Completed  . Pneumonia vaccines  Completed  *Topic was postponed. The date shown is not the original due date.   Preventive Care for Adults  A healthy lifestyle and preventive care can promote health and wellness. Preventive health guidelines for adults include the following key practices.  . A routine yearly physical is a good way to check with your health care provider about your health and preventive screening. It is a chance to share any concerns and updates on your health and to receive a thorough exam.  . Visit your dentist for a routine exam and preventive care every 6 months. Brush your teeth twice a day and floss once a day. Good oral hygiene prevents tooth decay and gum disease.  . The frequency of eye exams is based on your age, health, family medical history, use  of contact lenses, and other factors. Follow your health care provider's ecommendations for frequency of eye exams.  . Eat a healthy diet. Foods like vegetables, fruits, whole grains, low-fat dairy products, and lean  protein foods contain the nutrients you need without too many calories. Decrease your intake of foods high in solid fats, added sugars, and salt. Eat the right amount of calories for you. Get information about a proper diet from your health care provider, if necessary.  . Regular physical exercise is one of the most important things you can do for your health. Most adults should get at least 150 minutes of moderate-intensity exercise (any activity that increases your heart rate and causes you to sweat) each week. In addition, most adults need muscle-strengthening exercises on 2 or more days a week.  Silver Sneakers may be a benefit available to you. To determine eligibility, you may visit the website: www.silversneakers.com or contact program at (678)123-2965 Mon-Fri between 8AM-8PM.   . Maintain a healthy weight. The body mass index (BMI) is a screening tool to identify possible weight problems. It provides an estimate of body fat based on height and weight. Your health care provider can find your BMI and can help you achieve or maintain a healthy weight.   For adults 20 years and older: ? A BMI below 18.5 is considered underweight. ? A BMI of 18.5 to 24.9 is normal. ? A BMI of 25 to 29.9 is considered overweight. ? A BMI of 30 and above is considered obese.   . Maintain normal blood  lipids and cholesterol levels by exercising and minimizing your intake of saturated fat. Eat a balanced diet with plenty of fruit and vegetables. Blood tests for lipids and cholesterol should begin at age 66 and be repeated every 5 years. If your lipid or cholesterol levels are high, you are over 50, or you are at high risk for heart disease, you may need your cholesterol levels checked more frequently. Ongoing high lipid and cholesterol levels should be treated with medicines if diet and exercise are not working.  . If you smoke, find out from your health care provider how to quit. If you do not use tobacco, please  do not start.  . If you choose to drink alcohol, please do not consume more than 2 drinks per day. One drink is considered to be 12 ounces (355 mL) of beer, 5 ounces (148 mL) of wine, or 1.5 ounces (44 mL) of liquor.  . If you are 73-81 years old, ask your health care provider if you should take aspirin to prevent strokes.  . Use sunscreen. Apply sunscreen liberally and repeatedly throughout the day. You should seek shade when your shadow is shorter than you. Protect yourself by wearing long sleeves, pants, a wide-brimmed hat, and sunglasses year round, whenever you are outdoors.  . Once a month, do a whole body skin exam, using a mirror to look at the skin on your back. Tell your health care provider of new moles, moles that have irregular borders, moles that are larger than a pencil eraser, or moles that have changed in shape or color.

## 2016-11-01 NOTE — Progress Notes (Signed)
PCP notes:   Health maintenance:  Shingles vaccine - pt declined A1C - completed Urine microalbumin - completed/pt discontinued ARB in Oct 2017  Abnormal screenings:   Hearing - failed  Patient concerns:   Pt is still working with physicians about SOB.   Nurse concerns:  None  Next PCP appt:   11/07/16 @ 1100

## 2016-11-01 NOTE — Progress Notes (Signed)
Subjective:   Tommy Sullivan is a 71 y.o. male who presents for Medicare Annual/Subsequent preventive examination.  Review of Systems: N/A Cardiac Risk Factors include: advanced age (>11mn, >>93women);male gender;dyslipidemia;diabetes mellitus;hypertension;obesity (BMI >30kg/m2)     Objective:    Vitals: BP 94/62 (BP Location: Left Arm, Patient Position: Sitting, Cuff Size: Normal)   Pulse 88   Temp 98.6 F (37 C) (Oral)   Ht 5' 9.5" (1.765 m)   Wt 231 lb 8 oz (105 kg)   SpO2 98%   BMI 33.70 kg/m   Body mass index is 33.7 kg/m.  Tobacco History  Smoking Status  . Former Smoker  . Packs/day: 1.00  . Years: 20.00  . Types: Cigarettes  . Quit date: 10/02/1988  Smokeless Tobacco  . Never Used    Comment: quit in the 1990's     Counseling given: No   Past Medical History:  Diagnosis Date  . Allergy   . Arthritis   . [place]    a/ 05/2014 s/p RFCA.  .Marland KitchenCAD (coronary artery disease) 10/2007   a. 10/2007 s/p MI/PCI of RCA ([facility]- Dr. MCollene Mares;  b. 06/2016 MV: EF 34%, inf/inflat infarct; c. 07/2016 Cath: LM nl, LAd 90ost/prox, 548m99d, D2 30, RI small, LCX 10067mm1 40, RCA 50ost, 5 ISR, 84m64md, RPDA  80ost, RPL2 70, EF 45-50%;  d. 07/2016 CABG x 5 (LIMA->LAD, VG->Diag, VG->RI, VG->PDA->RPL).  . Chronic systolic CHF (congestive heart failure) (HCC)[place] a. 07/2016 Echo: Ef 30-35%.  . Diabetes mellitus without complication (HCC)[place]. Extrinsic asthma, unspecified   . Glaucoma   . Gout   . Hyperlipidemia   . Hypertensive heart disease   . Ischemic cardiomyopathy    a. 07/2016 Echo: Ef 30-35%, mildly dil LA, mild MR/TR, PASP 24 mmHg.  . PoMarland Kitchent-op Afib following CABG (07/2016)    a. 07/2016 - placed on amio post-op.  . ReMarland Kitcheninal vascular occlusion, unspecified    Right  . Sleep apnea    Past Surgical History:  Procedure Laterality Date  . ABLATION  05-07-2014   EPS and ablation of atrial flutter by Dr KleiCaryl ComesAPPENDECTOMY  1954  . ATRIAL FLUTTER ABLATION  N/A 05/07/2014   Procedure: ATRIAL FLUTTER ABLATION;  Surgeon: StevDeboraha Sprang;  [facility];  Service: Cardiovascular;  Laterality: N/A;  . CARDIAC CATHETERIZATION Left 07/10/2016   Procedure: Left Heart Cath and Coronary Angiography;  Surgeon: MuhaWellington Hampshire;  [facility];  Service: Cardiovascular;  Laterality: Left;  . COLONOSCOPY    . CORONARY ANGIOPLASTY  2009   stent Rex IsabelCORONARY ARTERY BYPASS GRAFT N/A 07/14/2016   Procedure: CORONARY ARTERY BYPASS GRAFTING (CABG), ON PUMP, TIMES FIVE, USING LEFT INTERNAL MAMMARY ARTERY, RIGHT GREATER SAPHENOUS VEIN HARVESTED ENDOSCOPICALLY;  Surgeon: PeteIvin Poot;  [facility];  Laterality: N/A;  SVG to PD and PL sequential SVG to 1st diagonal SVG to Ramus LIMA to LAD  . NECK SURGERY  1990's   x2, Fusion  . SHOULDER ARTHROSCOPY WITH SUBACROMIAL DECOMPRESSION Left 04/02/2014   Procedure: SHOULDER ARTHROSCOPY WITH SUBACROMIAL DECOMPRESSION WITH ROTATOR CUFF REPAIR;  Surgeon: JustNita Sells;  [place];  Laterality: Left;  Left shoulder rotator cuff repair, subacromial decompression  . SKIN SURGERY    . TEE WITHOUT CARDIOVERSION N/A 07/14/2016   Procedure: TRANSESOPHAGEAL ECHOCARDIOGRAM (  TEE);  Surgeon: Ivin Poot, MD;  Location: [place];  Service: Open Heart Surgery;  Laterality: N/A;  . TONSILLECTOMY     Family History  Problem Relation Age of Onset  . Diabetes Sister   . Coronary artery disease Brother   . AAA (abdominal aortic aneurysm) Father   . Heart disease Mother   . Diabetes Mother   . Diabetes Brother   . Colon cancer Neg Hx    History  Sexual Activity  . Sexual activity: No    Outpatient Encounter Prescriptions as of 11/01/2016  Medication Sig  . aspirin EC 325 MG EC tablet Take 1 tablet (325 mg total) by mouth daily.  . Blood Glucose Monitoring Suppl (ONETOUCH VERIO IQ  SYSTEM) w/Device KIT Check blood sugar twice a day and as directed. Dx E11.9  . Cholecalciferol (VITAMIN D3) 1000 units CAPS Take 1,000 Units by mouth daily.   . fenofibrate 160 MG tablet TAKE 1 TABLET BY MOUTH DAILY  . glucose blood (ONETOUCH VERIO) test strip Check blood sugar twice a day and as directed. Dx E11.9  . insulin glargine (LANTUS) 100 UNIT/ML injection Inject 8 Units into the skin 2 (two) times daily.   . Insulin Pen Needle (BD PEN NEEDLE NANO U/F) 32G X 4 MM MISC Use to inject lantus twice a day and as instructed. Dx E11.9  . latanoprost (XALATAN) 0.005 % ophthalmic solution Place 1 drop into the left eye at bedtime.   . metoprolol tartrate (LOPRESSOR) 25 MG tablet Take 12.5 mg by mouth 2 (two) times daily.  . modafinil (PROVIGIL) 200 MG tablet TAKE 1 TABLET BY MOUTH ONCE DAILY  . Multiple Vitamins-Minerals (MULTIVITAMIN MEN PO) Take 1 tablet by mouth.  . Omega-3 Fatty Acids (FISH OIL) 1000 MG CAPS Take 2,000 capsules by mouth 2 (two) times daily.   . ONE TOUCH LANCETS MISC Check blood sugar twice a day and as directed. Dx E11.9  . rosuvastatin (CRESTOR) 10 MG tablet TAKE 1 TABLET BY MOUTH DAILY. (Patient taking differently: Take 10 mg by mouth daily)  . spironolactone (ALDACTONE) 25 MG tablet Take 25 mg by mouth daily.  . tamsulosin (FLOMAX) 0.4 MG CAPS capsule TAKE 1 CAPSULE BY MOUTH DAILY (Patient taking differently: Take 0.4 mg by mouth in the every other day)  . torsemide (DEMADEX) 20 MG tablet Take 1 tablet (20 mg total) by mouth daily.  . [DISCONTINUED] potassium chloride (K-DUR) 10 MEQ tablet Take 1 tablet (10 mEq total) by mouth daily.   No facility-administered encounter medications on file as of 11/01/2016.     Activities of Daily Living In your present state of health, do you have any difficulty performing the following activities: 11/01/2016 07/11/2016  Hearing? N N  Vision? Y Y  Difficulty concentrating or making decisions? N N  Walking or climbing stairs? Y Y    Dressing or bathing? N N  Doing errands, shopping? N N  Preparing Food and eating ? N -  Using the Toilet? N -  In the past six months, have you accidently leaked urine? Y -  Do you have problems with loss of bowel control? N -  Managing your Medications? N -  Managing your Finances? N -  Housekeeping or managing your Housekeeping? N -  Some recent data might be hidden    Patient Care Team: Jinny Sanders, MD as PCP - Kennan, RN as [facility]   Assessment:     Hearing Screening  _0  _1  _2  _3  _4  _5  _6  _7  _8   Right ear:   40 40 40  40    Left ear:   40 0 40  40    Vision Screening Comments: Last vision exam in March 2017   Exercise Activities and Dietary recommendations Current Exercise Habits: Home exercise routine, Type of exercise: Other - see comments;treadmill (stationary bike, cardiac rehab), Time (Minutes): 30, Frequency (Times/Week): 5, Weekly Exercise (Minutes/Week): 150, Intensity: Mild, Exercise limited by: None identified  Goals    . Increase physical activity          Starting 11/01/2016, I will continue to exercise at least 30 min 5 days per week.       Fall Risk Fall Risk  11/01/2016 08/28/2016 06/14/2016 04/27/2016 03/15/2016  Falls in the past year? _9   Number falls in past yr: - - - - -  Injury with Fall? - - - - -  Risk for fall due to : - - - - -  Risk for fall due to (comments): - - - - -   Depression Screen PHQ 2/9 Scores 11/01/2016 08/28/2016 06/14/2016 04/27/2016  PHQ - 2 Score 0 1 0 0  PHQ- 9 Score - 3 - -    Cognitive Function MMSE - Mini Mental State Exam 11/01/2016 04/27/2016  Orientation to time 5 5  Orientation to Place 5 5  Registration 3 3  Attention/ Calculation 0 5  Recall 3 3  Language- name 2 objects 0 2  Language- repeat 1 1  Language- follow 3 step command 3 3  Language- read & follow direction 0 1  Write a sentence 0 1  Copy design 0 1   Total score 20 30     PLEASE NOTE: A Mini-Cog screen was completed. Maximum score is 20. A value of 0 denotes this part of [place] MMSE was not completed or the patient failed this part of the Mini-Cog screening.   Mini-Cog Screening Orientation to Time - Max 5 pts Orientation to Place - Max 5 pts Registration - Max 3 pts Recall - Max 3 pts Language Repeat - Max 1 pts Language Follow 3 Step Command - Max 3 pts     Immunization History  Administered Date(s) Administered  . Influenza Split 07/23/2012  . Influenza Whole 07/19/2010  . Influenza,inj,Quad PF,36+ Mos 07/31/2013, 07/09/2014, 07/14/2015, 09/05/2016  . Pneumococcal Conjugate-13 04/27/2016  . Pneumococcal Polysaccharide-23 10/02/2006, 07/23/2012  . Td 03/02/2009   Screening Tests Health Maintenance  Topic Date Due  . ZOSTAVAX  11/01/2025 (Originally 01/05/2006)  . OPHTHALMOLOGY EXAM  12/28/2016  . FOOT EXAM  04/27/2017  . HEMOGLOBIN A1C  05/01/2017  . URINE MICROALBUMIN  11/01/2017  . TETANUS/TDAP  03/03/2019  . COLONOSCOPY  05/24/2025  . INFLUENZA VACCINE  Completed  . Hepatitis C Screening  Completed  . PNA vac Low Risk Adult  Completed      Plan:     I have personally reviewed and addressed the Medicare Annual Wellness questionnaire and have noted the following in the patient's chart:  A. Medical and social history B. Use of alcohol, tobacco or illicit drugs  C. Current medications and supplements D. Functional ability and status E.  Nutritional status F.  Physical activity G. Advance directives H. List of other physicians I.  Hospitalizations, surgeries, and ER visits in previous 12 months J.  [place] to include hearing, vision, cognitive, depression L. Referrals and appointments - none  In addition, I  have reviewed and discussed with patient certain preventive protocols, quality metrics, and best practice recommendations. A written personalized care plan for preventive services as well as  general preventive health recommendations were provided to patient.  See attached scanned questionnaire for additional information.   Signed,   Lindell Noe, MHA, BS, LPN Health Coach

## 2016-11-01 NOTE — Progress Notes (Signed)
Pre visit review using our clinic review tool, if applicable. No additional management support is needed unless otherwise documented below in the visit note. 

## 2016-11-01 NOTE — Progress Notes (Signed)
Cardiac Individual Treatment Plan  Patient Details  Name: Tommy Sullivan MRN: [id number] Date of Birth: [birth year] Referring Provider:   Flowsheet Row Cardiac Rehab from 08/28/2016 in [facility] Cardiac and Pulmonary Rehab  Referring Provider  Kathlyn Sacramento MD      Initial Encounter Date:  Flowsheet Row Cardiac Rehab from 08/28/2016 in [facility] Cardiac and Pulmonary Rehab  Date  08/28/16  Referring Provider  Kathlyn Sacramento MD      Visit Diagnosis: S/P CABG x 5  Patient's Home Medications on Admission:  Current Outpatient Prescriptions:  .  aspirin EC 325 MG EC tablet, Take 1 tablet (325 mg total) by mouth daily., Disp: 30 tablet, Rfl: 0 .  Blood Glucose Monitoring Suppl (ONETOUCH VERIO IQ SYSTEM) w/Device KIT, Check blood sugar twice a day and as directed. Dx E11.9, Disp: 1 kit, Rfl: 0 .  Cholecalciferol (VITAMIN D3) 1000 units CAPS, Take 1,000 Units by mouth daily. , Disp: , Rfl:  .  fenofibrate 160 MG tablet, TAKE 1 TABLET BY MOUTH DAILY, Disp: 90 tablet, Rfl: 1 .  glucose blood (ONETOUCH VERIO) test strip, Check blood sugar twice a day and as directed. Dx E11.9, Disp: 100 each, Rfl: 5 .  insulin glargine (LANTUS) 100 UNIT/ML injection, Inject 8 Units into the skin 2 (two) times daily. , Disp: , Rfl:  .  Insulin Pen Needle (BD PEN NEEDLE NANO U/F) 32G X 4 MM MISC, Use to inject lantus twice a day and as instructed. Dx E11.9, Disp: 100 each, Rfl: 6 .  latanoprost (XALATAN) 0.005 % ophthalmic solution, Place 1 drop into the left eye at bedtime. , Disp: , Rfl: 3 .  metoprolol tartrate (LOPRESSOR) 25 MG tablet, Take 12.5 mg by mouth 2 (two) times daily., Disp: , Rfl:  .  modafinil (PROVIGIL) 200 MG tablet, TAKE 1 TABLET BY MOUTH ONCE DAILY, Disp: 90 tablet, Rfl: 3 .  Omega-3 Fatty Acids (FISH OIL) 1000 MG CAPS, Take 2,000 capsules by mouth 2 (two) times daily. , Disp: , Rfl:  .  ONE TOUCH LANCETS MISC, Check blood sugar twice a day and as directed. Dx E11.9, Disp: 100 each, Rfl: 5 .   potassium chloride (K-DUR) 10 MEQ tablet, Take 1 tablet (10 mEq total) by mouth daily., Disp: 30 tablet, Rfl: 3 .  rosuvastatin (CRESTOR) 10 MG tablet, TAKE 1 TABLET BY MOUTH DAILY. (Patient taking differently: Take 10 mg by mouth daily), Disp: 90 tablet, Rfl: 1 .  tamsulosin (FLOMAX) 0.4 MG CAPS capsule, TAKE 1 CAPSULE BY MOUTH DAILY (Patient taking differently: Take 0.4 mg by mouth in the every other day), Disp: 90 capsule, Rfl: 1 .  torsemide (DEMADEX) 20 MG tablet, Take 1 tablet (20 mg total) by mouth daily., Disp: 90 tablet, Rfl: 2  Past Medical History: Past Medical History:  Diagnosis Date  . Allergy   . Arthritis   . Atrial flutter ([place])    a/ 05/2014 s/p RFCA.  Marland Kitchen CAD (coronary artery disease) 10/2007   a. 10/2007 s/p MI/PCI of RCA [facility] - Dr. Collene Mares);  b. 06/2016 MV: EF 34%, inf/inflat infarct; c. 07/2016 Cath: LM nl, LAd 90ost/prox, 43m 99d, D2 30, RI small, LCX 1076mOm1 40, RCA 50ost, 5 ISR, 9069m0d, RPDA  80ost, RPL2 70, EF 45-50%;  d. 07/2016 CABG x 5 (LIMA->LAD, VG->Diag, VG->RI, VG->PDA->RPL).  . Chronic systolic CHF (congestive heart failure) ([facility]  a. 07/2016 Echo: Ef 30-35%.  . Diabetes mellitus without complication ([facility] . Extrinsic asthma, unspecified   .  Glaucoma   . Gout   . Hyperlipidemia   . Hypertensive heart disease   . Ischemic cardiomyopathy    a. 07/2016 Echo: Ef 30-35%, mildly dil LA, mild MR/TR, PASP 24 mmHg.  Marland Kitchen Post-op Afib following CABG (07/2016)    a. 07/2016 - placed on amio post-op.  Marland Kitchen Retinal vascular occlusion, unspecified    Right  . Sleep apnea     Tobacco Use: History  Smoking Status  . Former Smoker  . Packs/day: 1.00  . Years: 20.00  . Types: Cigarettes  . Quit date: 10/02/1988  Smokeless Tobacco  . Never Used    Comment: quit in the 1990's    Labs: Recent Review Flowsheet Data    Labs for ITP Cardiac and Pulmonary Rehab Latest Ref Rng & Units 07/14/2016 07/14/2016 07/15/2016 07/16/2016 07/16/2016   Cholestrol 0 - 200 mg/dL  - - - - -   LDLCALC 0 - 99 mg/dL - - - - -   LDLDIRECT mg/dL - - - - -   HDL >39.00 mg/dL - - - - -   Trlycerides 0.0 - 149.0 mg/dL - - - - -   Hemoglobin A1c 4.8 - 5.6 % - - - - -   PHART 7.350 - 7.450 7.386 7.391 - - 7.346(L)   PCO2ART 32.0 - 48.0 mmHg 35.3 36.8 - - 46.5   HCO3 20.0 - 28.0 mmol/L 21.1 22.3 - - 25.4   TCO2 0 - 100 mmol/L _0 - 27   ACIDBASEDEF 0.0 - 2.0 mmol/L 3.0(H) 2.0 - - 1.0   O2SAT % 94.0 94.0 - 51.6 94.0       Exercise Target Goals:    Exercise Program Goal: Individual exercise prescription set with THRR, safety & activity barriers. Participant demonstrates ability to understand and report RPE using BORG scale, to self-measure pulse accurately, and to acknowledge the importance of the exercise prescription.  Exercise Prescription Goal: Starting with aerobic activity 30 plus minutes a day, 3 days per week for initial exercise prescription. Provide home exercise prescription and guidelines that participant acknowledges understanding prior to discharge.  Activity Barriers & Risk Stratification:     Activity Barriers & Cardiac Risk Stratification - 08/28/16 1330      Activity Barriers & Cardiac Risk Stratification   Activity Barriers Shortness of Breath;Deconditioning;Muscular Weakness;Joint Problems  Shortness of breat occurs only when climbing stairs.  Has been exercising on the treadmill low speed no elevation and can maintain 30 + minutes without symptoms.; shoulder injury from fall 3 years ago, occasional pain   Cardiac Risk Stratification High      6 Minute Walk:     6 Minute Walk    Row Name 08/28/16 1403         6 Minute Walk   Phase Initial     Distance 1032 feet     Walk Time 5.78 minutes  sat down at 5:47     # of Rest Breaks 1     MPH 2.03     METS 2.53     RPE 17     Perceived Dyspnea  3     VO2 Peak 7.47     Symptoms Yes (comment)     Comments SOB, tightness/fatigue in thighs     Resting HR 85 bpm     Resting BP 106/66      Max Ex. HR 99 bpm     Max Ex. BP 122/64     2 Minute Post BP 106/54  Initial Exercise Prescription:     Initial Exercise Prescription - 08/28/16 1400      Date of Initial Exercise RX and Referring Provider   Date 08/28/16   Referring Provider Kathlyn Sacramento MD     Treadmill   MPH 1.5   Grade 0   Minutes 15   METs 2.15     NuStep   Level 1   Watts --  60-80 spm   Minutes 15   METs 2     Recumbant Elliptical   Level 1   RPM 50   Minutes 15   METs 2     Prescription Details   Frequency (times per week) 2   Duration Progress to 45 minutes of aerobic exercise without signs/symptoms of physical distress     Intensity   THRR 40-80% of Max Heartrate 111-137   Ratings of Perceived Exertion 11-15   Perceived Dyspnea 0-4     Progression   Progression Continue to progress workloads to maintain intensity without signs/symptoms of physical distress.     Resistance Training   Training Prescription Yes   Weight 3 lbs   Reps 10-12      Perform Capillary Blood Glucose checks as needed.  Exercise Prescription Changes:     Exercise Prescription Changes    Row Name 08/28/16 1300 09/12/16 1600 09/14/16 1000 09/26/16 1500 10/10/16 1600     Exercise Review   Progression -  walk test results Yes Yes Yes Yes     Response to Exercise   Blood Pressure (Admit) 106/66 104/60  - 100/66 134/70   Blood Pressure (Exercise) 122/64 118/64  - 132/70 100/64   Blood Pressure (Exit) 106/54 108/68  - 106/64 130/74   Heart Rate (Admit) 85 bpm 79 bpm  - 82 bpm 105 bpm   Heart Rate (Exercise) 99 bpm 108 bpm  - 135 bpm 118 bpm   Heart Rate (Exit) 75 bpm 75 bpm  - 116 bpm 86 bpm   Oxygen Saturation (Admit) 99 %  -  -  -  -   Oxygen Saturation (Exercise) 94 %  -  -  -  -   Rating of Perceived Exertion (Exercise) _0 Symptoms SOB, tightness/fatigue in legs none none none none   Comments  -  - Home Exercise Guidelines given 09/14/16 Home Exercise Guidelines given  09/14/16 Home Exercise Guidelines given 09/14/16   Duration  - Progress to 45 minutes of aerobic exercise without signs/symptoms of physical distress Progress to 45 minutes of aerobic exercise without signs/symptoms of physical distress Progress to 45 minutes of aerobic exercise without signs/symptoms of physical distress Progress to 45 minutes of aerobic exercise without signs/symptoms of physical distress   Intensity  - THRR unchanged THRR unchanged THRR unchanged THRR unchanged     Progression   Progression  - Continue to progress workloads to maintain intensity without signs/symptoms of physical distress. Continue to progress workloads to maintain intensity without signs/symptoms of physical distress. Continue to progress workloads to maintain intensity without signs/symptoms of physical distress. Continue to progress workloads to maintain intensity without signs/symptoms of physical distress.   Average METs  - 1.85 1.85 2.1 2.54     Resistance Training   Training Prescription  - Yes Yes Yes Yes   Weight  - 3 lbs 3 lbs 3 lbs 3 lbs   Reps  - 10-12 10-12 10-12 10-12     Interval Training   Interval Training  - No No  No No     Treadmill   MPH  - 1.5 1.5 1.8 1.8   Grade  - 0 0 0.5 0.5   Minutes  - _0 METs  - 2.15 2.15 2.5 2.5     NuStep   Level  - _1 Minutes  - _2 METs  - 1.6 1.6 2.3 3.5     Recumbant Elliptical   Level  - _3 Minutes  - _4 METs  - 1.8 1.8 1.5 1.5     Home Exercise Plan   Plans to continue exercise at  -  - Home  walking, treadmill, elliptical, home gym Home  walking, treadmill, elliptical, home gym Home  walking, treadmill, elliptical, home gym   Frequency  -  - Add 3 additional days to program exercise sessions. Add 3 additional days to program exercise sessions. Add 3 additional days to program exercise sessions.   Row Name 10/24/16 1300             Exercise Review   Progression Yes         Response to  Exercise   Blood Pressure (Admit) 106/68       Blood Pressure (Exercise) 118/60       Blood Pressure (Exit) 92/64       Heart Rate (Admit) 74 bpm       Heart Rate (Exercise) 116 bpm       Heart Rate (Exit) 87 bpm       Rating of Perceived Exertion (Exercise) 13       Symptoms none       Comments Home Exercise Guidelines given 09/14/16       Duration Progress to 45 minutes of aerobic exercise without signs/symptoms of physical distress       Intensity THRR unchanged         Progression   Progression Continue to progress workloads to maintain intensity without signs/symptoms of physical distress.       Average METs 2.4         Resistance Training   Training Prescription Yes       Weight 3 lbs       Reps 10-12         Interval Training   Interval Training No         Treadmill   MPH 1.8       Grade 0.5       Minutes 15       METs 2.5         NuStep   Level 1       Minutes 15       METs 3.2         Recumbant Elliptical   Level 1       Minutes 15       METs 1.4         Home Exercise Plan   Plans to continue exercise at Home  walking, treadmill, elliptical, home gym       Frequency Add 3 additional days to program exercise sessions.          Exercise Comments:     Exercise Comments    Row Name 08/28/16 1409 09/07/16 0911 09/12/16 1606 09/14/16 1037 09/26/16 1542   Exercise Comments Exercise goal is to get up and down stairs without SOB. First full day of  exercise!  Patient was oriented to gym and equipment including functions, settings, policies, and procedures.  Patient's individual exercise prescription and treatment plan were reviewed.  All starting workloads were established based on the results of the 6 minute walk test done at initial orientation visit.  The plan for exercise progression was also introduced and progression will be customized based on patient's performance and goals. Ronalee Belts is off to a good start with exercise.  His blood sugars have been great!  We  will continue to monitor his progression. Reviewed home exercise with pt today.  Pt plans to using treadmill, elliptical and home gym for exercise.  Reviewed THR, pulse, RPE, sign and symptoms, and when to call 911 or MD.  Also discussed weather considerations and indoor options.  Pt voiced understanding. Ronalee Belts has been doing well in rehab.  Today, we added both speed and incline to his treadmill; he did have to take a break, but more do to incline than speed.  He wants to be able to walk around his neighborhood, but the hills are his limitation, so we will work on his incline tolerance.  We will continue to monitor his progression.   [place] Name 10/10/16 1610 10/24/16 1354         Exercise Comments Ronalee Belts continues to do well in rehab.  He is getting better and lasting longer on the treadmill before taking a rest break.  We will continue to monitor his progression. Ronalee Belts has been doing well in rehab.  He says that he is feeling better and stronger than when he started.  We will continue to monitor his progress.         Discharge Exercise Prescription (Final Exercise Prescription Changes):     Exercise Prescription Changes - 10/24/16 1300      Exercise Review   Progression Yes     Response to Exercise   Blood Pressure (Admit) 106/68   Blood Pressure (Exercise) 118/60   Blood Pressure (Exit) 92/64   Heart Rate (Admit) 74 bpm   Heart Rate (Exercise) 116 bpm   Heart Rate (Exit) 87 bpm   Rating of Perceived Exertion (Exercise) 13   Symptoms none   Comments Home Exercise Guidelines given 09/14/16   Duration Progress to 45 minutes of aerobic exercise without signs/symptoms of physical distress   Intensity THRR unchanged     Progression   Progression Continue to progress workloads to maintain intensity without signs/symptoms of physical distress.   Average METs 2.4     Resistance Training   Training Prescription Yes   Weight 3 lbs   Reps 10-12     Interval Training   Interval Training No      Treadmill   MPH 1.8   Grade 0.5   Minutes 15   METs 2.5     NuStep   Level 1   Minutes 15   METs 3.2     Recumbant Elliptical   Level 1   Minutes 15   METs 1.4     Home Exercise Plan   Plans to continue exercise at Home  walking, treadmill, elliptical, home gym   Frequency Add 3 additional days to program exercise sessions.      Nutrition:  Target Goals: Understanding of nutrition guidelines, daily intake of sodium <1554m, cholesterol <2092m calories 30% from fat and 7% or less from saturated fats, daily to have 5 or more servings of fruits and vegetables.  Biometrics:     Pre Biometrics - 08/28/16 1411  Pre Biometrics   Height 5' 10.25" (1.784 m)   Weight 223 lb (101.2 kg)   Waist Circumference 40.5 inches   Hip Circumference 42.5 inches   Waist to Hip Ratio 0.95 %   BMI (Calculated) 31.8   Single Leg Stand 6.54 seconds       Nutrition Therapy Plan and Nutrition Goals:     Nutrition Therapy & Goals - 10/26/16 1152      Nutrition Therapy   Diet Instructed patient and his wife on an 1800 calorie meal plan including dietary guidelines for heart healthy eating as well as for diabetes.   Drug/Food Interactions Statins/Certain Fruits   Protein (specify units) 8   Fiber 20 grams   Whole Grain Foods 3 servings   Saturated Fats 12 max. grams   Fruits and Vegetables 5 servings/day  with optimal goal of 8 servings daily.   Sodium 1500 grams     Personal Nutrition Goals   Personal Goal #1 Monitor food/beverage intake and record.   Personal Goal #2 Continue to read label for saturated fat, trans fat and sodium.   Comments Patient has already made significant positive diet changes. He reports his main challenge regarding diet is striving to keep sodium within guidelines.     Intervention Plan   Intervention Prescribe, educate and counsel regarding individualized specific dietary modifications aiming towards targeted core components such as weight,  hypertension, lipid management, diabetes, heart failure and other comorbidities.;Nutrition handout(s) given to patient.   Expected Outcomes Short Term Goal: Understand basic principles of dietary content, such as calories, fat, sodium, cholesterol and nutrients.;Short Term Goal: A plan has been developed with personal nutrition goals set during dietitian appointment.;Long Term Goal: Adherence to prescribed nutrition plan.      Nutrition Discharge: Rate Your Plate Scores:     Nutrition Assessments - 08/28/16 1320      Rate Your Plate Scores   Pre Score 76   Pre Score % 87.4 %      Nutrition Goals Re-Evaluation:     Nutrition Goals Re-Evaluation    Row Name 09/28/16 1028             Intervention Plan   Intervention Continue to educate, counsel and set short/long term goals regarding individualized specific personal dietary modifications.       Comments Scheduled dietician appointment for pt on Jan 1/25 at 1030am.  He has been following a heart healthy diet and really trying to control his sodium intake.          Psychosocial: Target Goals: Acknowledge presence or absence of depression, maximize coping skills, provide positive support system. Participant is able to verbalize types and ability to use techniques and skills needed for reducing stress and depression.  Initial Review & Psychosocial Screening:     Initial Psych Review & Screening - 08/28/16 1327      Initial Review   Current issues with --  none reported     Family Dynamics   Good Support System? Yes  Wife     Barriers   Psychosocial barriers to participate in program There are no identifiable barriers or psychosocial needs.;The patient should benefit from training in stress management and relaxation.     Screening Interventions   Interventions Encouraged to exercise      Quality of Life Scores:     Quality of Life - 08/28/16 1328      Quality of Life Scores   Health/Function Pre 22.71 %    Socioeconomic Pre 28.29 %  Psych/Spiritual Pre 29.14 %   Family Pre 27 %   GLOBAL Pre 25.88 %      PHQ-9: Recent Review Flowsheet Data    Depression screen [facility] 2/9 08/28/2016 06/14/2016 04/27/2016 03/15/2016 12/01/2015   Decreased Interest 1 0 0 0 0   Down, Depressed, Hopeless 0 0 0 0 0   PHQ - 2 Score 1 0 0 0 0   Altered sleeping 1 - - - -   Tired, decreased energy 1 - - - -   Change in appetite 0 - - - -   Feeling bad or failure about yourself  0 - - - -   Trouble concentrating 0 - - - -   Moving slowly or fidgety/restless 0 - - - -   Suicidal thoughts 0 - - - -   PHQ-9 Score 3 - - - -   Difficult doing work/chores Not difficult at all - - - -      Psychosocial Evaluation and Intervention:     Psychosocial Evaluation - 09/12/16 0946      Psychosocial Evaluation & Interventions   Interventions Encouraged to exercise with the program and follow exercise prescription   Comments Counselor met with Mr. Cona (Mr. Lemmie Evens) today for initial psychosocial evaluation.  He is a 71 yr old who had triple bypass in October.  He has a spouse of 62 years and some relatives of his spouse who live locally and provide support for him.  Mr. Lemmie Evens reports having multiple health issues with shoulder and knee problems; diabetes; blind in one eye; and sleep apnea in addition to his cardiac issues.  He states his appetite is good and he denies a history or current symptoms of depression or anxiety.  Mr. Lemmie Evens reports he is typically in a positive mood and has minimal stress in his life.  He has goals to be able to walk up the stairs without shortness of breath.  He has exercise equipment at home that he will begin using once he is released to do so.  Staff will follow with Mr. Lemmie Evens throughout the course of this program.        Psychosocial Re-Evaluation:     Psychosocial Re-Evaluation    Row Name 10/26/16 [phone]             Psychosocial Re-Evaluation   Interventions Encouraged to attend Cardiac Rehabilitation  for the exercise       Comments Ronalee Belts continues without psychosocial issues. Will continue to review with Legrand Como for any changes.  He has good positive outlook on life.        Continued Psychosocial Services Needed No          Vocational Rehabilitation: Provide vocational rehab assistance to qualifying candidates.   Vocational Rehab Evaluation & Intervention:     Vocational Rehab - 08/28/16 1332      Initial Vocational Rehab Evaluation & Intervention   Assessment shows need for Vocational Rehabilitation No      Education: Education Goals: Education classes will be provided on a weekly basis, covering required topics. Participant will state understanding/return demonstration of topics presented.  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 Gives an explanation and review of dietary fats and fiber. Flowsheet Row Cardiac Rehab from 10/26/2016 in [facility] Cardiac and Pulmonary Rehab  Date  10/10/16  Educator  PI  Instruction Review Code  2- meets goals/outcomes      Controlling Sodium/Reading Food Labels: -Group verbal and written material supporting the discussion of sodium use in heart healthy nutrition. Review and explanation with models, verbal and written materials for utilization of the food label. Flowsheet Row Cardiac Rehab from 10/26/2016 in [facility] Cardiac and Pulmonary Rehab  Date  10/17/16  Educator  LB  Instruction Review Code  2- meets goals/outcomes      Exercise Physiology & Risk Factors: - Group verbal and written instruction with models to review the exercise physiology of the  cardiovascular system and associated critical values. Details cardiovascular disease risk factors and the goals associated with each risk factor. Flowsheet Row Cardiac Rehab from 10/26/2016 in [facility] Cardiac and Pulmonary Rehab  Date  10/24/16  Educator  [facility]  Instruction Review Code  2- meets goals/outcomes      Aerobic Exercise & Resistance Training: - Gives group verbal and written discussion on the health impact of inactivity. On the components of aerobic and resistive training programs and the benefits of this training and how to safely progress through these programs. Flowsheet Row Cardiac Rehab from 10/26/2016 in [facility] Cardiac and Pulmonary Rehab  Date  10/26/16  Educator  AS  Instruction Review Code  2- meets goals/outcomes      Flexibility, Balance, General Exercise Guidelines: - Provides group verbal and written instruction on the benefits of flexibility and balance training programs. Provides general exercise guidelines with specific guidelines to those with heart or lung disease. Demonstration and skill practice provided.   Stress Management: - Provides group verbal and written instruction about the health risks of elevated stress, cause of high stress, and healthy ways to reduce stress. Flowsheet Row Cardiac Rehab from 10/26/2016 in [facility] Cardiac and Pulmonary Rehab  Date  09/14/16  Educator  TS  Instruction Review Code  2- meets goals/outcomes      Depression: - Provides group verbal and written instruction on the correlation between heart/lung disease and depressed mood, treatment options, and the stigmas associated with seeking treatment. Flowsheet Row Cardiac Rehab from 10/26/2016 in [facility] Cardiac and Pulmonary Rehab  Date  10/12/16  Educator  [facility]  Instruction Review Code  2- meets goals/outcomes      Anatomy & Physiology of the Heart: - Group verbal and written instruction and models provide basic cardiac anatomy and physiology, with the coronary electrical and arterial  systems. Review of: AMI, Angina, Valve disease, Heart Failure, Cardiac Arrhythmia, Pacemakers, and the ICD.   Cardiac Procedures: - Group verbal and written instruction and models to describe the testing methods done to diagnose heart disease. Reviews the outcomes of the test results. Describes the treatment choices: Medical Management, Angioplasty, or Coronary Bypass Surgery. Flowsheet Row Cardiac Rehab from 10/26/2016 in [facility] Cardiac and Pulmonary Rehab  Date  09/12/16  Educator  LB  Instruction Review Code  2- meets goals/outcomes      Cardiac Medications: - Group verbal and written instruction to review commonly prescribed medications for heart disease. Reviews the medication, class of the drug, and side effects. Includes the steps to properly store meds and maintain the prescription regimen. Flowsheet Row Cardiac Rehab from 10/26/2016 in [facility] Cardiac and Pulmonary Rehab  Date  09/21/16 Marisue Humble 2]  Educator  TS  Instruction Review Code  2-  meets goals/outcomes      Go Sex-Intimacy & Heart Disease, Get SMART - Goal Setting: - Group verbal and written instruction through game format to discuss heart disease and the return to sexual intimacy. Provides group verbal and written material to discuss and apply goal setting through the application of the S.M.A.R.T. Method. Flowsheet Row Cardiac Rehab from 10/26/2016 in [facility] Cardiac and Pulmonary Rehab  Date  09/12/16  Educator  LB  Instruction Review Code  2- meets goals/outcomes      Other Matters of the Heart: - Provides group verbal, written materials and models to describe Heart Failure, Angina, Valve Disease, and Diabetes in the realm of heart disease. Includes description of the disease process and treatment options available to the cardiac patient.   Exercise & Equipment Safety: - Individual verbal instruction and demonstration of equipment use and safety with use of the equipment. Flowsheet Row Cardiac Rehab from 10/26/2016 in [facility]  Cardiac and Pulmonary Rehab  Date  08/28/16  Educator  SB  Instruction Review Code  2- meets goals/outcomes      Infection Prevention: - Provides verbal and written material to individual with discussion of infection control including proper hand washing and proper equipment cleaning during exercise session. Flowsheet Row Cardiac Rehab from 10/26/2016 in [facility] Cardiac and Pulmonary Rehab  Date  08/28/16  Educator  SB  Instruction Review Code  2- meets goals/outcomes      Falls Prevention: - Provides verbal and written material to individual with discussion of falls prevention and safety. Flowsheet Row Cardiac Rehab from 10/26/2016 in [facility] Cardiac and Pulmonary Rehab  Date  08/28/16  Educator  SB  Instruction Review Code  2- meets goals/outcomes      Diabetes: - Individual verbal and written instruction to review signs/symptoms of diabetes, desired ranges of glucose level fasting, after meals and with exercise. Advice that pre and post exercise glucose checks will be done for 3 sessions at entry of program. Flowsheet Row Cardiac Rehab from 10/26/2016 in [facility] Cardiac and Pulmonary Rehab  Date  08/28/16  Educator  SB  Instruction Review Code  2- meets goals/outcomes       Knowledge Questionnaire Score:     Knowledge Questionnaire Score - 08/28/16 1332      Knowledge Questionnaire Score   Pre Score 18/28  Reviewed with Legrand Como the results and answers      Core Components/Risk Factors/Patient Goals at Admission:     Personal Goals and Risk Factors at Admission - 08/28/16 1320      Core Components/Risk Factors/Patient Goals on Admission    Weight Management Yes;Obesity   Intervention Weight Management: Develop a combined nutrition and exercise program designed to reach desired caloric intake, while maintaining appropriate intake of nutrient and fiber, sodium and fats, and appropriate energy expenditure required for the weight goal.   Admit Weight 223 lb (101.2 kg)   Goal  Weight: Short Term 218 lb (98.9 kg)   Goal Weight: Long Term 200 lb (90.7 kg)   Expected Outcomes Short Term: Continue to assess and modify interventions until short term weight is achieved;Long Term: Adherence to nutrition and physical activity/exercise program aimed toward attainment of established weight goal;Weight Maintenance: Understanding of the daily nutrition guidelines, which includes 25-35% calories from fat, 7% or less cal from saturated fats, less than 235m cholesterol, less than 1.5gm of sodium, & 5 or more servings of fruits and vegetables daily   Sedentary Yes   Intervention Provide advice, education, support and counseling about physical  activity/exercise needs.;Develop an individualized exercise prescription for aerobic and resistive training based on initial evaluation findings, risk stratification, comorbidities and participant's personal goals.   Expected Outcomes Achievement of increased cardiorespiratory fitness and enhanced flexibility, muscular endurance and strength shown through measurements of functional capacity and personal statement of participant.   Increase Strength and Stamina Yes  Able to climb stairs without SOB   Intervention Provide advice, education, support and counseling about physical activity/exercise needs.;Develop an individualized exercise prescription for aerobic and resistive training based on initial evaluation findings, risk stratification, comorbidities and participant's personal goals.   Expected Outcomes Achievement of increased cardiorespiratory fitness and enhanced flexibility, muscular endurance and strength shown through measurements of functional capacity and personal statement of participant.   Diabetes Yes   Intervention Provide education about signs/symptoms and action to take for hypo/hyperglycemia.;Provide education about proper nutrition, including hydration, and aerobic/resistive exercise prescription along with prescribed medications to  achieve blood glucose in normal ranges: Fasting glucose 65-99 mg/dL   Expected Outcomes Short Term: Participant verbalizes understanding of the signs/symptoms and immediate care of hyper/hypoglycemia, proper foot care and importance of medication, aerobic/resistive exercise and nutrition plan for blood glucose control.;Long Term: Attainment of HbA1C < 7%.   Heart Failure Yes   Intervention Provide a combined exercise and nutrition program that is supplemented with education, support and counseling about heart failure. Directed toward relieving symptoms such as shortness of breath, decreased exercise tolerance, and extremity edema.   Expected Outcomes Improve functional capacity of life;Short term: Attendance in program 2-3 days a week with increased exercise capacity. Reported lower sodium intake. Reported increased fruit and vegetable intake. Reports medication compliance.;Short term: Daily weights obtained and reported for increase. Utilizing diuretic protocols set by physician.;Long term: Adoption of self-care skills and reduction of barriers for early signs and symptoms recognition and intervention leading to self-care maintenance.   Hypertension Yes   Intervention Provide education on lifestyle modifcations including regular physical activity/exercise, weight management, moderate sodium restriction and increased consumption of fresh fruit, vegetables, and low fat dairy, alcohol moderation, and smoking cessation.;Monitor prescription use compliance.   Expected Outcomes Short Term: Continued assessment and intervention until BP is < 140/45m HG in hypertensive participants. < 130/891mHG in hypertensive participants with diabetes, heart failure or chronic kidney disease.;Long Term: Maintenance of blood pressure at goal levels.   Lipids Yes   Intervention Provide education and support for participant on nutrition & aerobic/resistive exercise along with prescribed medications to achieve LDL <7092mHDL  >63m31m Expected Outcomes Short Term: Participant states understanding of desired cholesterol values and is compliant with medications prescribed. Participant is following exercise prescription and nutrition guidelines.;Long Term: Cholesterol controlled with medications as prescribed, with individualized exercise RX and with personalized nutrition plan. Value goals: LDL < 70mg71mL > 40 mg.      Core Components/Risk Factors/Patient Goals Review:      Goals and Risk Factor Review    Row Name 09/28/16 1017 10/26/16 0942           Core Components/Risk Factors/Patient Goals Review   Personal Goals Review Weight Management/Obesity;Sedentary;Increase Strength and Stamina;Improve shortness of breath with ADL's;Hypertension;Diabetes;Lipids;Heart Failure Weight Management/Obesity;Increase Strength and Stamina;Diabetes;Hypertension;Lipids      Review [name] Ronalee Beltsff to a good start in rehab.  He is already feeling stronger with increased stamina. Now he can go up the stairs without a break 50% of the time!  He is exercising at home on his bike and elliptical on his off days.  He is  currently doing 20 min of exercise, we talked about bumping this up to 30 min.  His balance is still off and he hopes to improve as he continues to get stronger.  He is doing well with his diabetes and heart healthy diet.  His weight fluctuates based off when he takes his dieuretic.  His right leg is still swollen and tight, especialy when compared to left leg.  He is wearing a compression stocking and the MD is aware.  He has no other HF symptoms currently.  He has had no problems with his medications. [name] continues to do well with his exercise and he states that he is noticing increased stamina since satrting the program. Blood sugar is controlled. HAs had med change and is still tweaking the dose to prevent blood sugar from dropping to low. Bllod pressure remains in good range. HAs been below 672 systolic at times. No major  symptoms reported with the lower BP readings. Reviewed with Legrand Como to let MD know if he has symptoms with the lower BP readings: dizziness, off balance . He voiced iunderstanding of need to inform MD. A1C and other labs due to be drawn next week.   Ejection Fraction has risen to between 40 and 45%.       Expected Outcomes Short Term: Ronalee Belts will increase his exercise time at home to 30 min.  Long Term:  Ronalee Belts will continue to come to class to work on his risk factors. Short Term: Ronalee Belts will increase his exercise time at home to 30 min.  Long Term:  Ronalee Belts will continue to come to class to work on his risk factors. Ronalee Belts will continue to work with his medical team to pursue the right leg swelling and intermittent pain in right hip when walking.          Core Components/Risk Factors/Patient Goals at Discharge (Final Review):      Goals and Risk Factor Review - 10/26/16 0942      Core Components/Risk Factors/Patient Goals Review   Personal Goals Review Weight Management/Obesity;Increase Strength and Stamina;Diabetes;Hypertension;Lipids   Review [name] continues to do well with his exercise and he states that he is noticing increased stamina since satrting the program. Blood sugar is controlled. HAs had med change and is still tweaking the dose to prevent blood sugar from dropping to low. Bllod pressure remains in good range. HAs been below 094 systolic at times. No major symptoms reported with the lower BP readings. Reviewed with Legrand Como to let MD know if he has symptoms with the lower BP readings: dizziness, off balance . He voiced iunderstanding of need to inform MD. A1C and other labs due to be drawn next week.   Ejection Fraction has risen to between 40 and 45%.    Expected Outcomes Short Term: Ronalee Belts will increase his exercise time at home to 30 min.  Long Term:  Ronalee Belts will continue to come to class to work on his risk factors. Ronalee Belts will continue to work with his medical team to pursue the right leg swelling and  intermittent pain in right hip when walking.       ITP Comments:     ITP Comments    Row Name 08/28/16 1313 09/06/16 0600 10/03/16 0622 11/01/16 0618     ITP Comments Med review completed. Initial ITP created. Diagnosis documentation can be found in [facility] Encounter 07/10/2016 30 day review completed for review by Dr Emily Filbert.  Continue with ITP unless changes noted by Dr Sabra Heck. [place]  day review. Continue with ITP unless directed changes per Medical Director review. 30 day review. Continue with ITP unless directed changes per Medical Director review.       Comments:

## 2016-11-02 ENCOUNTER — Encounter: Payer: PPO | Attending: Cardiovascular Disease

## 2016-11-02 ENCOUNTER — Other Ambulatory Visit: Payer: Self-pay | Admitting: *Deleted

## 2016-11-02 ENCOUNTER — Other Ambulatory Visit: Payer: 59

## 2016-11-02 DIAGNOSIS — Z951 Presence of aortocoronary bypass graft: Secondary | ICD-10-CM | POA: Insufficient documentation

## 2016-11-02 NOTE — Patient Outreach (Signed)
[facility]) Care Management  11/02/2016  RAYBURN DEPASQUALE 03/14/1946 [id number]  RN Health Coach telephone call to patient.  [employer] compliance verified. Per patient he has a lot going on. Patient went for Dr visit yesterday and his A1C is 6.5. Patient requested that I call back that this was not a good time.  Plan: RN will call patient again within 14 days.  [facility] Care Management [phone]

## 2016-11-07 ENCOUNTER — Encounter: Payer: Self-pay | Admitting: Family Medicine

## 2016-11-07 ENCOUNTER — Telehealth: Payer: Self-pay | Admitting: Cardiovascular Disease

## 2016-11-07 ENCOUNTER — Ambulatory Visit (INDEPENDENT_AMBULATORY_CARE_PROVIDER_SITE_OTHER): Payer: PPO | Admitting: Family Medicine

## 2016-11-07 ENCOUNTER — Encounter: Payer: PPO | Admitting: Respiratory Therapy

## 2016-11-07 VITALS — BP 98/64 | HR 73 | Temp 97.5°F | Ht 69.5 in | Wt 228.5 lb

## 2016-11-07 DIAGNOSIS — N400 Enlarged prostate without lower urinary tract symptoms: Secondary | ICD-10-CM | POA: Insufficient documentation

## 2016-11-07 DIAGNOSIS — I483 Typical atrial flutter: Secondary | ICD-10-CM

## 2016-11-07 DIAGNOSIS — E119 Type 2 diabetes mellitus without complications: Secondary | ICD-10-CM | POA: Diagnosis not present

## 2016-11-07 DIAGNOSIS — I5021 Acute systolic (congestive) heart failure: Secondary | ICD-10-CM | POA: Diagnosis not present

## 2016-11-07 DIAGNOSIS — E1340 Other specified diabetes mellitus with diabetic neuropathy, unspecified: Secondary | ICD-10-CM | POA: Diagnosis not present

## 2016-11-07 DIAGNOSIS — I10 Essential (primary) hypertension: Secondary | ICD-10-CM

## 2016-11-07 DIAGNOSIS — Z9989 Dependence on other enabling machines and devices: Secondary | ICD-10-CM

## 2016-11-07 DIAGNOSIS — Z951 Presence of aortocoronary bypass graft: Secondary | ICD-10-CM

## 2016-11-07 DIAGNOSIS — G4733 Obstructive sleep apnea (adult) (pediatric): Secondary | ICD-10-CM

## 2016-11-07 DIAGNOSIS — I208 Other forms of angina pectoris: Secondary | ICD-10-CM

## 2016-11-07 MED ORDER — FENOFIBRATE 160 MG PO TABS: 160.0000 mg | ORAL_TABLET | Freq: Every day | ORAL | 3 refills | Status: AC

## 2016-11-07 MED ORDER — TAMSULOSIN HCL 0.4 MG PO CAPS: ORAL_CAPSULE | ORAL | 3 refills | Status: DC

## 2016-11-07 MED ORDER — ROSUVASTATIN CALCIUM 10 MG PO TABS: ORAL_TABLET | ORAL | 3 refills | Status: AC

## 2016-11-07 NOTE — Progress Notes (Signed)
Pre visit review using our clinic review tool, if applicable. No additional management support is needed unless otherwise documented below in the visit note. 

## 2016-11-07 NOTE — Assessment & Plan Note (Signed)
Follow up with pulm as planned.

## 2016-11-07 NOTE — Assessment & Plan Note (Signed)
Pt to discuss possible cath with Dr. Fletcher Anon at upcoming appt in 2 days.

## 2016-11-07 NOTE — Patient Instructions (Addendum)
Stay on current meds until follow up with Dr. Fletcher Anon.   Okay to try a trial of stopping Flomax to see if it helps dizziness.  Decrease lantus Units  to 13  Units twice daily.  Can take once daily  Instead.  If CBGs decreasing further.. Call to let me know.

## 2016-11-07 NOTE — Assessment & Plan Note (Signed)
Stable control on current regimen. 

## 2016-11-07 NOTE — Progress Notes (Signed)
Subjective:    Patient ID: Tommy Sullivan, male    DOB: [birth year], 71 y.o.   MRN: [id number]  HPI   The patient saw Candis Musa, LPN for medicare wellness. Note reviewed in detail and important notes copied below.  Health maintenance: Shingles vaccine - pt declined A1C - completed Urine microalbumin - completed/pt discontinued ARB in Oct 2017  Abnormal screenings:  Hearing - failed  Patient concerns:  Pt is still working with physicians about SOB.   Nurse concerns: None  He has had more recent congestion, fluid in ears. Dizziness off and on.. decreasing flomax has made urine flow worse but dizziness is some better.  He plans on coming off to see how he does.   Diabetes:  Excellent control on  Lantus 14 units BID.Marland Kitchen Had lows on 20 Unit. 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   Hypertension:    Stable control on current meds. 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 CAD, s/p cabg  followed by cardiology. Dr. Fletcher Anon  considering returning to losartan  (was stopped given ARF) per 10/2106 OV note.  He has been doing cardiac rehab. He is still short of breath with exertion when walkin up stairs, some better in last 2 weeks with fluids coming off. Has appt with Dr. Jerald Kief for further eval of  CHF.  On torsemide, Bblocker, spironolactone CVTS Dr. Lawson Fiscal Pain and tightness in right leg with walking: ABIs nml.  Has appt with pulmonology for sleep apnea given having trouble wearing CPAP. No longer snoring with weight loss. Still requiring provigil to help keep him awake during the day.  Body mass index is 33.26 kg/m.    Patient Care Team: Jinny Sanders, MD as PCP - [place], RN as [facility] Management Social History /Family History/Past Medical History reviewed and updated if needed.  Review of Systems  Constitutional: Positive for fatigue. Negative for fever.  HENT: Negative for ear pain.   Eyes: Negative for pain.  Respiratory: Positive for shortness of breath. Negative for cough.   Cardiovascular: Positive for leg swelling. Negative for chest pain and palpitations.  Gastrointestinal: Negative for abdominal distention.       Objective:   Physical Exam  Constitutional: Vital signs are normal. He appears well-developed and well-nourished.  HENT:  Head: Normocephalic.  Right Ear: Hearing normal.  Left Ear: Hearing normal.  Nose: Nose normal.  Mouth/Throat: Oropharynx is clear and moist and mucous membranes are normal.  Neck: Trachea normal. Carotid bruit is not present. No thyroid mass and no thyromegaly present.  Cardiovascular: Normal rate, regular rhythm and normal pulses.  Exam reveals no gallop, no distant heart sounds and no friction rub.   No murmur heard. 1 plus peripheral edema left leg 2 plus in right, pt wearing compression hose.   Pulmonary/Chest: Effort normal and breath sounds normal. No respiratory distress.  Skin: Skin is  warm, dry and intact. No rash noted.  Psychiatric: He has a normal mood and affect. His speech is normal and behavior is normal. Thought content normal.     Diabetic foot exam: Normal inspection No skin breakdown No calluses  Normal DP pulses Normal sensation to light touch and  monofilament Nails normal      Assessment & Plan:

## 2016-11-07 NOTE — Telephone Encounter (Signed)
Left message on pt cell VM of available appt w/Dr. Fletcher Anon Thursday, Feb 8, 11:45am. Requested call back to confirm.

## 2016-11-07 NOTE — Assessment & Plan Note (Signed)
Rate controlled on BBlocker.

## 2016-11-07 NOTE — Assessment & Plan Note (Signed)
Was controlled on flomax, but this med is contributing to dizziness. Stop flomax. If urinry symtpoms return, pt will restart or notify us.  Requested refill anyway.

## 2016-11-07 NOTE — Assessment & Plan Note (Signed)
Minimal symptoms at this point with better sugar control. On no medications.

## 2016-11-07 NOTE — Assessment & Plan Note (Signed)
Pt reports significant improvement with addition of spironolactone and further diuresis. He still ahs swelling in ankles, but has lost 10 lbs in last 2 weeks.  Encouraged pt to continue diuretics for now.  Keep appt with Dr. Fletcher Anon as well as appt with Dr. Jerald Kief with CHF clinic.

## 2016-11-07 NOTE — Assessment & Plan Note (Signed)
Improving control with weight loss, and significant diet changes.  Reduce lantus to 13 units daily. Follow CBGs.

## 2016-11-07 NOTE — Progress Notes (Signed)
Daily Session Note  Patient Details  Name: RONTRELL MOQUIN MRN: [id number] Date of Birth: September 21, 1946 Referring Provider:   Flowsheet Row Cardiac Rehab from 08/28/2016 in [facility] Cardiac and Pulmonary Rehab  Referring Provider  Kathlyn Sacramento MD      Encounter Date: 11/07/2016  Check In:     Session Check In - 11/07/16 0825      Check-In   Location [facility]-Cardiac & Pulmonary Rehab   Staff Present Alberteen Sam, MA, ACSM RCEP, Exercise Physiologist;Laureen Owens Shark, BS, RRT, Respiratory Therapist;Carroll Enterkin, RN, BSN   Supervising physician immediately available to respond to emergencies See telemetry face sheet for immediately available ER MD   Medication changes reported     Yes   Fall or balance concerns reported    No   Warm-up and Cool-down Performed on first and last piece of equipment   Resistance Training Performed Yes   VAD Patient? No     Pain Assessment   Currently in Pain? No/denies   Multiple Pain Sites No         Goals Met:  Proper associated with RPD/PD & O2 Sat Independence with exercise equipment Achieving weight loss Exercise tolerated well No report of cardiac concerns or symptoms Strength training completed today  Goals Unmet:  Not Applicable  Comments: Pt able to follow exercise prescription today without complaint.  Will continue to monitor for progression.   Dr. Emily Filbert is Medical Director for [facility].

## 2016-11-07 NOTE — Telephone Encounter (Signed)
Pt confirmed Thursday appt w/Dr. Fletcher Anon.

## 2016-11-08 ENCOUNTER — Ambulatory Visit: Payer: Self-pay | Admitting: *Deleted

## 2016-11-09 ENCOUNTER — Ambulatory Visit (INDEPENDENT_AMBULATORY_CARE_PROVIDER_SITE_OTHER): Payer: PPO | Admitting: Internal Medicine

## 2016-11-09 ENCOUNTER — Ambulatory Visit (INDEPENDENT_AMBULATORY_CARE_PROVIDER_SITE_OTHER): Payer: PPO | Admitting: Cardiovascular Disease

## 2016-11-09 ENCOUNTER — Encounter: Payer: Self-pay | Admitting: Internal Medicine

## 2016-11-09 ENCOUNTER — Encounter: Payer: Self-pay | Admitting: Cardiovascular Disease

## 2016-11-09 VITALS — BP 105/79 | HR 83 | Ht 69.0 in | Wt 226.0 lb

## 2016-11-09 VITALS — BP 98/72 | HR 80 | Ht 69.5 in | Wt 227.4 lb

## 2016-11-09 DIAGNOSIS — I5022 Chronic systolic (congestive) heart failure: Secondary | ICD-10-CM | POA: Diagnosis not present

## 2016-11-09 DIAGNOSIS — Z951 Presence of aortocoronary bypass graft: Secondary | ICD-10-CM

## 2016-11-09 DIAGNOSIS — E785 Hyperlipidemia, unspecified: Secondary | ICD-10-CM | POA: Diagnosis not present

## 2016-11-09 DIAGNOSIS — G4733 Obstructive sleep apnea (adult) (pediatric): Secondary | ICD-10-CM

## 2016-11-09 DIAGNOSIS — J449 Chronic obstructive pulmonary disease, unspecified: Secondary | ICD-10-CM | POA: Diagnosis not present

## 2016-11-09 DIAGNOSIS — I2510 Atherosclerotic heart disease of native coronary artery without angina pectoris: Secondary | ICD-10-CM | POA: Diagnosis not present

## 2016-11-09 DIAGNOSIS — Z9989 Dependence on other enabling machines and devices: Secondary | ICD-10-CM | POA: Diagnosis not present

## 2016-11-09 MED ORDER — GLYCOPYRROLATE-FORMOTEROL 9-4.8 MCG/ACT IN AERO: 2.0000 | INHALATION_SPRAY | Freq: Two times a day (BID) | RESPIRATORY_TRACT | 0 refills | Status: AC

## 2016-11-09 MED ORDER — MODAFINIL 200 MG PO TABS
200.0000 mg | ORAL_TABLET | Freq: Every day | ORAL | 3 refills | Status: DC
Start: 2016-11-09 — End: 2017-01-29

## 2016-11-09 NOTE — Progress Notes (Signed)
Cardiology Office Note   Date:  11/09/2016   ID:  Tommy Sullivan, DOB 09/27/1946, MRN [id number]  PCP:  Eliezer Lofts, MD  Cardiologist:   Kathlyn Sacramento, MD   Chief Complaint  Patient presents with  . Follow-up    here to discuss heart catheterization; shortness of breath with exertion; denies chest pain, dizziness, palpitations.      History of Present Illness: Tommy Sullivan is a 71 y.o. male who presents for a follow-up visit regarding coronary artery disease, chronic systolic heart faiure and atrial flutter status post ablation.  He has known history of coronary artery disease with previous myocardial infarction  in 2009 with PCI done by Dr. Collene Mares. He has other chronic medical conditions that include type 2 diabetes, hypertension and hyperlipidemia.   He underwent atrial flutter ablation in August 2015 by Dr. Caryl Comes. He does have sleep apnea and uses a CPAP on a regular basis. He was seen in October for significant worsening of exertional dyspnea. He underwent a stress test which showed evidence of prior inferior infarct with a decrease in ejection fraction from before. I proceeded with cardiac catheterization which showed severe three-vessel coronary artery disease with an ejection fraction of 45%. The patient underwent CABG by Dr. Lucianne Lei trigt. He had postoperative atrial fibrillation which was successfully treated with amiodarone. The patient developed volume overload that required continuation of torsemide.  He had an echocardiogram done in October after CABG which showed a drop in LV systolic function with an EF of 30-35%. He had a repeat echocardiogram last month which showed an EF of 40-45%, moderate mitral regurgitation, moderate to severe tricuspid regurgitation with peak systolic pressure of 41 mmHg.  He was started on spironolactone in addition to torsemide and since then he lost 13 pounds with significant improvement in shortness of breath and volume overload. He resumed  exercising and was able to do one hour in the stationary bike yesterday. He complained of exertional leg pain. ABI and exertional ABI were normal. He was limited at that time by significant shortness of breath.    Past Medical History:  Diagnosis Date  . Allergy   . Arthritis   . Atrial flutter ([place])    a/ 05/2014 s/p RFCA.  Marland Kitchen CAD (coronary artery disease) 10/2007   a. 10/2007 s/p MI/PCI of RCA [facility] - Dr. Collene Mares);  b. 06/2016 MV: EF 34%, inf/inflat infarct; c. 07/2016 Cath: LM nl, LAd 90ost/prox, 38m 99d, D2 30, RI small, LCX 1046mOm1 40, RCA 50ost, 5 ISR, 9066m0d, RPDA  80ost, RPL2 70, EF 45-50%;  d. 07/2016 CABG x 5 (LIMA->LAD, VG->Diag, VG->RI, VG->PDA->RPL).  . Chronic systolic CHF (congestive heart failure) ([facility]  a. 07/2016 Echo: Ef 30-35%.  . Diabetes mellitus without complication ([facility] . Extrinsic asthma, unspecified   . Glaucoma   . Gout   . Hyperlipidemia   . Hypertensive heart disease   . Ischemic cardiomyopathy    a. 07/2016 Echo: Ef 30-35%, mildly dil LA, mild MR/TR, PASP 24 mmHg.  . 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 07/2016 - placed on amio post-op.  . RMarland Kitchentinal vascular occlusion, unspecified    Right  . Sleep apnea     Past Surgical History:  Procedure Laterality Date  . ABLATION  05-07-2014   EPS and ablation of atrial flutter by Dr KleCaryl Comes APPENDECTOMY  1954  . ATRIAL FLUTTER ABLATION N/A 05/07/2014   Procedure: ATRIAL FLUTTER ABLATION;  Surgeon:  Deboraha Sprang, MD;  Location: [facility] CATH LAB;  Service: Cardiovascular;  Laterality: N/A;  . CARDIAC CATHETERIZATION Left 07/10/2016   Procedure: Left Heart Cath and Coronary Angiography;  Surgeon: Wellington Hampshire, MD;  Location: [place] CV LAB;  Service: Cardiovascular;  Laterality: Left;  . COLONOSCOPY    . CORONARY ANGIOPLASTY  2009   stent [place]  . CORONARY ARTERY BYPASS GRAFT N/A 07/14/2016   Procedure: CORONARY ARTERY BYPASS GRAFTING (CABG), ON PUMP, TIMES FIVE, USING LEFT INTERNAL  MAMMARY ARTERY, RIGHT GREATER SAPHENOUS VEIN HARVESTED ENDOSCOPICALLY;  Surgeon: Ivin Poot, MD;  Location: [place];  Service: Open Heart Surgery;  Laterality: N/A;  SVG to PD and PL sequential SVG to 1st diagonal SVG to [place] LIMA to LAD  . NECK SURGERY  1990's   x2, Fusion  . SHOULDER ARTHROSCOPY WITH SUBACROMIAL DECOMPRESSION Left 04/02/2014   Procedure: SHOULDER ARTHROSCOPY WITH SUBACROMIAL DECOMPRESSION WITH ROTATOR CUFF REPAIR;  Surgeon: Nita Sells, MD;  Location: [place];  Service: Orthopedics;  Laterality: Left;  Left shoulder rotator cuff repair, subacromial decompression  . SKIN SURGERY    . TEE WITHOUT CARDIOVERSION N/A 07/14/2016   Procedure: TRANSESOPHAGEAL ECHOCARDIOGRAM (TEE);  Surgeon: Ivin Poot, MD;  Location: [place];  Service: Open Heart Surgery;  Laterality: N/A;  . TONSILLECTOMY       Current Outpatient Prescriptions  Medication Sig Dispense Refill  . aspirin EC 325 MG EC tablet Take 1 tablet (325 mg total) by mouth daily. 30 tablet 0  . Blood Glucose Monitoring Suppl (ONETOUCH VERIO IQ SYSTEM) w/Device KIT Check blood sugar twice a day and as directed. Dx E11.9 1 kit 0  . Cholecalciferol (VITAMIN D3) 1000 units CAPS Take 1,000 Units by mouth daily.     . fenofibrate 160 MG tablet Take 1 tablet (160 mg total) by mouth daily. 90 tablet 3  . glucose blood (ONETOUCH VERIO) test strip Check blood sugar twice a day and as directed. Dx E11.9 100 each 5  . Insulin Glargine (LANTUS SOLOSTAR) 100 UNIT/ML Solostar Pen Inject 14 Units into the skin 2 (two) times daily.    . Insulin Pen Needle (BD PEN NEEDLE NANO U/F) 32G X 4 MM MISC Use to inject lantus twice a day and as instructed. Dx E11.9 100 each 6  . LANTUS SOLOSTAR 100 UNIT/ML Solostar Pen     . latanoprost (XALATAN) 0.005 % ophthalmic solution Place 1 drop into the left eye at bedtime.   3  . metoprolol tartrate (LOPRESSOR) 25 MG tablet Take 12.5 mg by mouth 2 (two) times daily.    .  modafinil (PROVIGIL) 200 MG tablet TAKE 1 TABLET BY MOUTH ONCE DAILY 90 tablet 3  . Multiple Vitamins-Minerals (MULTIVITAMIN MEN PO) Take 1 tablet by mouth daily.     . Omega-3 Fatty Acids (FISH OIL) 1000 MG CAPS Take 2,000 capsules by mouth 2 (two) times daily.     . ONE TOUCH LANCETS MISC Check blood sugar twice a day and as directed. Dx E11.9 100 each 5  . rosuvastatin (CRESTOR) 10 MG tablet Take 10 mg by mouth every night 90 tablet 3  . spironolactone (ALDACTONE) 25 MG tablet Take 25 mg by mouth daily.    Marland Kitchen torsemide (DEMADEX) 20 MG tablet Take 1 tablet (20 mg total) by mouth daily. 90 tablet 2  . tamsulosin (FLOMAX) 0.4 MG CAPS capsule Take 0.4 mg by mouth in the every other night (Patient not taking: Reported on 11/09/2016) 45  capsule 3   No current facility-administered medications for this visit.     Allergies:   No known allergies    Social History:  The patient  reports that he quit smoking about 28 years ago. His smoking use included Cigarettes. He has a 20.00 pack-year smoking history. He has never used smokeless tobacco. He reports that he drinks about 1.8 oz of alcohol per week . He reports that he does not use drugs.   Family History:  The patient's family history includes AAA (abdominal aortic aneurysm) in his father; Coronary artery disease in his brother; Diabetes in his brother, mother, and sister; Heart disease in his mother.    ROS:  Please see the history of present illness.   Otherwise, review of systems are positive for none.   All other systems are reviewed and negative.    PHYSICAL EXAM: VS:  BP 105/79 (BP Location: Left Arm, Patient Position: Sitting, Cuff Size: Normal)   Pulse 83   Ht 5' 9"  (1.753 m)   Wt 226 lb (102.5 kg)   SpO2 99%   BMI 33.37 kg/m  , BMI Body mass index is 33.37 kg/m. GEN: Well nourished, well developed, in no acute distress  HEENT: normal  Neck: no JVD, carotid bruits, or masses Cardiac: RRR; no murmurs, rubs, or gallops, mild  bilateral leg edema worse on the right Respiratory:  clear to auscultation bilaterally, normal work of breathing GI: soft, nontender, nondistended, + BS MS: no deformity or atrophy  Skin: warm and dry, no rash Neuro:  Strength and sensation are intact Psych: euthymic mood, full affect   EKG:  EKG is not ordered today.    Recent Labs: 07/11/2016: TSH 3.712 07/19/2016: Magnesium 2.2 08/03/2016: Brain Natriuretic Peptide 485.2; Hemoglobin 11.6; Platelets 267 11/01/2016: ALT 19; BUN 26; Creatinine, Ser 1.41; Potassium 4.2; Sodium 136    Lipid Panel    Component Value Date/Time   CHOL 95 11/01/2016 0811   TRIG 84.0 11/01/2016 0811   HDL 20.00 (L) 11/01/2016 0811   CHOLHDL 5 11/01/2016 0811   VLDL 16.8 11/01/2016 0811   LDLCALC 58 11/01/2016 0811   LDLDIRECT 71.0 11/24/2015 0831      Wt Readings from Last 3 Encounters:  11/09/16 226 lb (102.5 kg)  11/07/16 228 lb 8 oz (103.6 kg)  11/01/16 231 lb 8 oz (105 kg)      No flowsheet data found.    ASSESSMENT AND PLAN:  1.  Coronary artery disease involving native coronary arteries without angina: Very slow improvement after CABG but it appears that his symptoms improved significantly after volume overload improved.   2. Chronic systolic heart failure: Volume status improved significantly with the addition of spironolactone. Follow-up labs showed stable renal function with a creatinine of 1.4 and potassium of 4.2. He has lost 13 pounds over the last 2 weeks and since then he had significant improvement in exertional dyspnea. I reviewed his blood pressure readings at home and his systolic pressure is frequently below 100. Thus, it is difficult to start him on an ACE inhibitor or ARB at the present time. Most recent EF was 40-45%.  I was thinking about proceeding with a right and left cardiac catheterization when he was not doing well. However, it appears that his symptoms improved over the last 2 weeks. A small dose digoxin can be  considered.  3. History of atrial flutter status post RF ablation in 2015: No evidence of recurrent arrhythmia.  3. Essential hypertension:  The pressure is  controlled  4. Hyperlipidemia:  Currently on rosuvastatin, fenofibrate and fish oil.  lipid profile was reviewed and was optimal except for low HDL.   6. Diabetes mellitus: Managed by his primary care physician .  Disposition:   FU with me in 3 months  Signed,  Kathlyn Sacramento, MD  11/09/2016 10:21 AM    [place]

## 2016-11-09 NOTE — Patient Instructions (Signed)
Medication Instructions: Continue same medications.   Labwork: None.   Procedures/Testing: None.   Follow-Up: 3 months with Dr. Arida.   Any Additional Special Instructions Will Be Listed Below (If Applicable).     If you need a refill on your cardiac medications before your next appointment, please call your pharmacy.   

## 2016-11-09 NOTE — Patient Instructions (Addendum)
Sample - Bevespi inhaler   Inhale 2 puffs, twice daily  See if it helps your breathing  Order DME APS- please change CPAP to auto titrate 5-20, continue mask of choice, humidifier, supplies, AirView dx OSA  Please call as needed

## 2016-11-09 NOTE — Progress Notes (Signed)
   Subjective:    Patient ID: ENSO YOKE, male    DOB: [birth year], 71 y.o.   MRN: [id number]  HPI male former smoker followed for OSA, complicated by CAD/Atrial flutter/ MI 2009, DM 2, HBP NPSG 2007:  AHI 16/hr, PLMS 23/hr (?related to SDB?)  Titration 2007:  Optimal pressure 16, PLMS still 15/hr   ------------------------------------------------------------------------------------  11/09/2016 70-old male former smoker followed for OSA, complicated by CAD/atrial flutter/MI 2009, DM 2, HBP CPAP 14/APS FOLLOWS FOR: Has not worn CPAP since October d/t having open heart surgery. Pt states that it does not feel right when he tries to wear it. Pt states that he cannot tell it is too much pressure or not enough. DME: [facility] ; Pt states that since open heart surgery that he is unable to make it to the top of a flight of stairs or long distances without stopping to catch his breath. Worse with fluid retention. He feels smothered trying to wear CPAP. Lost weight after surgery but has been retaining fluid. Wife says he snores less. Sleeps flat with nocturia 0-1. Echocardiogram 10/05/16-EF 40-45 percent, PA peak 41 mm Hg PFT 07/11/16- mod severe obstruction, mild Diffusion deficit, response to BD CXR 08/03/2016- IMPRESSION: Persistent small left pleural effusion. Lungs elsewhere clear. Stable cardiac silhouette.  Review of Systems  + = pos Constitutional: Negative for fever and unexpected weight change.  HENT: Negative for congestion, dental problem, ear pain, nosebleeds, postnasal drip, rhinorrhea, sinus pressure, sneezing, sore throat and trouble swallowing.   Eyes: Negative for redness and itching.  Respiratory: Negative for cough, chest tightness, +shortness of breath and wheezing.   Cardiovascular: Negative for palpitations and leg swelling.  Gastrointestinal: Negative for nausea and vomiting.  Genitourinary: Negative for dysuria.  Musculoskeletal: Negative for joint swelling.  Skin: Negative  for rash.  Neurological: Negative for headaches.  Hematological: Does not bruise/bleed easily.  Psychiatric/Behavioral: Negative for dysphoric mood. The patient is not nervous/anxious.      Objective:  OBJ- Physical Exam General- Alert, Oriented, Affect-appropriate, Distress- none acute Skin- rash-none, lesions- none, excoriation- none Lymphadenopathy- none Head- atraumatic            Eyes- + R strabismus/ blind            Ears- Hearing, canals-normal            Nose- Clear, no-Septal dev, mucus, polyps, erosion, perforation             Throat- Mallampati II-III , mucosa clear , drainage- none, tonsils- atrophic Neck- flexible , trachea midline, no stridor , thyroid nl, carotid no bruit Chest - symmetrical excursion , unlabored           Heart/CV- RRR , no murmur , no gallop  , no rub, nl s1 s2                           - JVD- none , edema- none, stasis changes- none, varices- none           Lung- + rhonchi left upper lobe, dry cough on command wheeze- none, , dullness-none, rub- none           Chest wall-  Abd-  Br/ Gen/ Rectal- Not done, not indicated Extrem- cyanosis- none, clubbing, none, atrophy- none, strength- nl, + support hose Neuro- grossly intact to observation   Assessment & Plan:

## 2016-11-09 NOTE — Progress Notes (Signed)
Daily Session Note  Patient Details  Name: CAYLEB JARNIGAN MRN: [id number] Date of Birth: [birth year] Referring Provider:   Flowsheet Row Cardiac Rehab from 08/28/2016 in [facility] Cardiac and Pulmonary Rehab  Referring Provider  Kathlyn Sacramento MD      Encounter Date: 11/09/2016  Check In:     Session Check In - 11/09/16 0841      Check-In   Location [facility]-Cardiac & Pulmonary Rehab   Staff Present Alberteen Sam, MA, ACSM RCEP, Exercise Physiologist;Patricia Surles RN Vickki Hearing, BA, ACSM CEP, Exercise Physiologist   Supervising physician immediately available to respond to emergencies See telemetry face sheet for immediately available ER MD   Medication changes reported     No   Fall or balance concerns reported    No   Warm-up and Cool-down Performed on first and last piece of equipment   Resistance Training Performed Yes   VAD Patient? No     Pain Assessment   Currently in Pain? No/denies   Multiple Pain Sites No           Exercise Prescription Changes - 11/08/16 1400      Exercise Review   Progression Yes     Response to Exercise   Blood Pressure (Admit) 124/62   Blood Pressure (Exercise) 106/60   Blood Pressure (Exit) 112/60   Heart Rate (Admit) 91 bpm   Heart Rate (Exercise) 118 bpm   Heart Rate (Exit) 84 bpm   Rating of Perceived Exertion (Exercise) 13   Symptoms none   Comments Home Exercise Guidelines given 09/14/16   Duration Progress to 45 minutes of aerobic exercise without signs/symptoms of physical distress   Intensity THRR unchanged     Progression   Progression Continue to progress workloads to maintain intensity without signs/symptoms of physical distress.   Average METs 2.2     Resistance Training   Training Prescription Yes   Weight 3 lbs   Reps 10-12     Interval Training   Interval Training No     Treadmill   MPH 1.8   Grade 0.5   Minutes 15   METs 2.5     NuStep   Level 1   Minutes 15   METs 2.6     Recumbant  Elliptical   Level 1   Minutes 15   METs 1.5     Home Exercise Plan   Plans to continue exercise at Home  walking, treadmill, elliptical, home gym   Frequency Add 3 additional days to program exercise sessions.      Goals Met:  Independence with exercise equipment Exercise tolerated well No report of cardiac concerns or symptoms Strength training completed today  Goals Unmet:  Not Applicable  Comments: Pt able to follow exercise prescription today without complaint.  Will continue to monitor for progression.    Dr. Emily Filbert is Medical Director for [place] and [facility].

## 2016-11-10 ENCOUNTER — Other Ambulatory Visit: Payer: Self-pay | Admitting: *Deleted

## 2016-11-10 NOTE — Patient Outreach (Signed)
[facility]) Care Management  11/10/2016  Tommy Sullivan March 23, 1946 [id number]   RN Health Coach  attempted  Follow up outreach call to patient.  Patient was unavailable. HIPPA compliance voicemail message was left with return callback number.  Plan: RN will call patient again within 14 days.   [facility] [phone]

## 2016-11-11 NOTE — Assessment & Plan Note (Signed)
Not clear why he is uncomfortable with CPAP now except that he lost weight. Plan-we will try changing to AutoPap and get a download

## 2016-11-11 NOTE — Assessment & Plan Note (Addendum)
Persistent dyspnea on exertion since heart surgery. Chest x-ray not remarkable in November but PFT in October indicated moderately severe obstructive disease Plan-see how he responds to a LABA/ LAMA inhaler- Bevespi. If this doesn't make useful improvement then he should have follow-up CXR

## 2016-11-14 ENCOUNTER — Encounter: Payer: PPO | Admitting: Respiratory Therapy

## 2016-11-14 ENCOUNTER — Telehealth: Payer: Self-pay | Admitting: Internal Medicine

## 2016-11-14 DIAGNOSIS — Z951 Presence of aortocoronary bypass graft: Secondary | ICD-10-CM

## 2016-11-14 NOTE — Telephone Encounter (Signed)
Per CY--stop the bevespi.  Pt would like further recommendations for the sob and cough.  CY please advise. Thanks.

## 2016-11-14 NOTE — Progress Notes (Signed)
Daily Session Note  Patient Details  Name: Tommy Sullivan MRN: [id number] Date of Birth: [birth year] Referring Provider:   Flowsheet Row Cardiac Rehab from 08/28/2016 in [facility] Cardiac and Pulmonary Rehab  Referring Provider  Kathlyn Sacramento MD      Encounter Date: 11/14/2016  Check In:     Session Check In - 11/14/16 0826      Check-In   Location [facility]-Cardiac & Pulmonary Rehab   Staff Present Alberteen Sam, MA, ACSM RCEP, Exercise Physiologist;Laureen Owens Shark, BS, RRT, Respiratory Therapist;Carroll Enterkin, RN, BSN   Supervising physician immediately available to respond to emergencies See telemetry face sheet for immediately available ER MD   Medication changes reported     No   Fall or balance concerns reported    No   Warm-up and Cool-down Performed on first and last piece of equipment   Resistance Training Performed Yes   VAD Patient? No     Pain Assessment   Currently in Pain? No/denies   Multiple Pain Sites No         Goals Met:  Proper associated with RPD/PD & O2 Sat Independence with exercise equipment Exercise tolerated well No report of cardiac concerns or symptoms Strength training completed today  Goals Unmet:  Not Applicable  Comments: Pt able to follow exercise prescription today without complaint.  Will continue to monitor for progression.   Dr. Emily Filbert is Medical Director for [place] and [facility].

## 2016-11-14 NOTE — Telephone Encounter (Signed)
Spoke with pt, who states at his OV with CY on 11-09-16 pt was given sample of bevespi. Pt feels that he has worsen since starting this medication. Pt c/o increased sob (pt was not able to walk up a flight of stairs), gained 6lbs in 3 days & dry cough at times prod with white mucus mainly while sleeping. Pt states he stopped taken bevespi 11-12-16. Pt states breathing has improved since stopping this medication.pt reports he was able to finish his full exercise yesterday and walk up a flight of stairs with mild sob.  CY please advise. Thanks.  Current Outpatient Prescriptions on File Prior to Visit  Medication Sig Dispense Refill  . aspirin EC 325 MG EC tablet Take 1 tablet (325 mg total) by mouth daily. 30 tablet 0  . Blood Glucose Monitoring Suppl (ONETOUCH VERIO IQ SYSTEM) w/Device KIT Check blood sugar twice a day and as directed. Dx E11.9 1 kit 0  . Cholecalciferol (VITAMIN D3) 1000 units CAPS Take 1,000 Units by mouth daily.     . fenofibrate 160 MG tablet Take 1 tablet (160 mg total) by mouth daily. 90 tablet 3  . glucose blood (ONETOUCH VERIO) test strip Check blood sugar twice a day and as directed. Dx E11.9 100 each 5  . Insulin Glargine (LANTUS SOLOSTAR) 100 UNIT/ML Solostar Pen Inject 14 Units into the skin 2 (two) times daily.    . Insulin Pen Needle (BD PEN NEEDLE NANO U/F) 32G X 4 MM MISC Use to inject lantus twice a day and as instructed. Dx E11.9 100 each 6  . LANTUS SOLOSTAR 100 UNIT/ML Solostar Pen     . latanoprost (XALATAN) 0.005 % ophthalmic solution Place 1 drop into the left eye at bedtime.   3  . metoprolol tartrate (LOPRESSOR) 25 MG tablet Take 12.5 mg by mouth 2 (two) times daily.    . modafinil (PROVIGIL) 200 MG tablet Take 1 tablet (200 mg total) by mouth daily. 90 tablet 3  . Multiple Vitamins-Minerals (MULTIVITAMIN MEN PO) Take 1 tablet by mouth daily.     . Omega-3 Fatty Acids (FISH OIL) 1000 MG CAPS Take 2,000 capsules by mouth 2 (two) times daily.     . ONE TOUCH  LANCETS MISC Check blood sugar twice a day and as directed. Dx E11.9 100 each 5  . rosuvastatin (CRESTOR) 10 MG tablet Take 10 mg by mouth every night 90 tablet 3  . spironolactone (ALDACTONE) 25 MG tablet Take 25 mg by mouth daily.    . tamsulosin (FLOMAX) 0.4 MG CAPS capsule Take 0.4 mg by mouth in the every other night (Patient not taking: Reported on 11/09/2016) 45 capsule 3  . torsemide (DEMADEX) 20 MG tablet Take 1 tablet (20 mg total) by mouth daily. 90 tablet 2   No current facility-administered medications on file prior to visit.     Allergies  Allergen Reactions  . No Known Allergies

## 2016-11-15 MED ORDER — BUDESONIDE-FORMOTEROL FUMARATE 160-4.5 MCG/ACT IN AERO: 2.0000 | INHALATION_SPRAY | Freq: Two times a day (BID) | RESPIRATORY_TRACT | 0 refills | Status: DC

## 2016-11-15 NOTE — Telephone Encounter (Signed)
Spoke with pt, aware of recs.  Sample left up front for pt.  Nothing further needed.

## 2016-11-15 NOTE — Telephone Encounter (Signed)
Suggest we offer sample Symbicort 160    Inhale 2 puffs then rinse mouth, twice daily. See if this helps breathing.

## 2016-11-16 ENCOUNTER — Telehealth: Payer: Self-pay | Admitting: Internal Medicine

## 2016-11-16 DIAGNOSIS — Z951 Presence of aortocoronary bypass graft: Secondary | ICD-10-CM

## 2016-11-16 NOTE — Telephone Encounter (Signed)
Pt. Came by the office and dropped off the extra sample of the Bevespi that he is no longer needing because he was told to no longer use it. Inhaler was discarded up the inhaler waste. Nothing further is needed at this time.   Margie A Blankenship, CMA      5:03 PM  Note    Per CY--stop the bevespi.  Pt would like further recommendations for the sob and cough.  CY please

## 2016-11-16 NOTE — Progress Notes (Signed)
Daily Session Note  Patient Details  Name: Tommy Sullivan MRN: [id number] Date of Birth: 1946/09/15 Referring Provider:   Flowsheet Row Cardiac Rehab from 08/28/2016 in [facility] Cardiac and Pulmonary Rehab  Referring Provider  Kathlyn Sacramento MD      Encounter Date: 11/16/2016  Check In:     Session Check In - 11/16/16 0906      Check-In   Location [facility]-Cardiac & Pulmonary Rehab   Staff Present Alberteen Sam, MA, ACSM RCEP, Exercise Physiologist;Patricia Surles RN Vickki Hearing, BA, ACSM CEP, Exercise Physiologist   Supervising physician immediately available to respond to emergencies See telemetry face sheet for immediately available ER MD   Medication changes reported     No   Fall or balance concerns reported    No   Warm-up and Cool-down Performed on first and last piece of equipment   Resistance Training Performed Yes   VAD Patient? No     Pain Assessment   Currently in Pain? No/denies         Goals Met:  Independence with exercise equipment Exercise tolerated well No report of cardiac concerns or symptoms Strength training completed today  Goals Unmet:  Not Applicable  Comments: Pt able to follow exercise prescription today without complaint.  Will continue to monitor for progression.    Dr. Emily Filbert is Medical Director for [place] and [facility].

## 2016-11-21 ENCOUNTER — Encounter: Payer: PPO | Admitting: Respiratory Therapy

## 2016-11-21 DIAGNOSIS — Z951 Presence of aortocoronary bypass graft: Secondary | ICD-10-CM

## 2016-11-21 NOTE — Progress Notes (Signed)
Daily Session Note  Patient Details  Name: Tommy Sullivan MRN: [id number] Date of Birth: 15-Dec-1945 Referring Provider:   Flowsheet Row Cardiac Rehab from 08/28/2016 in [facility] Cardiac and Pulmonary Rehab  Referring Provider  Kathlyn Sacramento MD      Encounter Date: 11/21/2016  Check In:     Session Check In - 11/21/16 0847      Check-In   Location [facility]-Cardiac & Pulmonary Rehab   Staff Present Heath Lark, RN, BSN, CCRP;Laureen Owens Shark, BS, RRT, Respiratory Therapist;Carroll Enterkin, RN, BSN   Supervising physician immediately available to respond to emergencies See telemetry face sheet for immediately available ER MD   Medication changes reported     Yes   Comments Symbicort added   Fall or balance concerns reported    No   Warm-up and Cool-down Performed on first and last piece of equipment   Resistance Training Performed Yes   VAD Patient? No     Pain Assessment   Currently in Pain? No/denies   Multiple Pain Sites No         Goals Met:  Proper associated with RPD/PD & O2 Sat Independence with exercise equipment Exercise tolerated well No report of cardiac concerns or symptoms Strength training completed today  Goals Unmet:  Not Applicable  Comments: Pt able to follow exercise prescription today without complaint.  Will continue to monitor for progression.   Dr. Emily Filbert is Medical Director for [place] and [facility].

## 2016-11-22 ENCOUNTER — Ambulatory Visit: Payer: Self-pay | Admitting: *Deleted

## 2016-11-23 ENCOUNTER — Encounter: Payer: PPO | Admitting: *Deleted

## 2016-11-23 DIAGNOSIS — Z951 Presence of aortocoronary bypass graft: Secondary | ICD-10-CM

## 2016-11-23 NOTE — Progress Notes (Signed)
Daily Session Note  Patient Details  Name: Tommy Sullivan MRN: [id number] Date of Birth: 1945-10-16 Referring Provider:   Flowsheet Row Cardiac Rehab from 08/28/2016 in [facility] Cardiac and Pulmonary Rehab  Referring Provider  Kathlyn Sacramento MD      Encounter Date: 11/23/2016  Check In:     Session Check In - 11/23/16 1059      Check-In   Location [facility]-Cardiac & Pulmonary Rehab   Staff Present Alberteen Sam, MA, ACSM RCEP, Exercise Physiologist;Amanda Oletta Darter, BA, ACSM CEP, Exercise Physiologist;Other  Darel Hong, RN   Supervising physician immediately available to respond to emergencies See telemetry face sheet for immediately available ER MD   Medication changes reported     Yes   Fall or balance concerns reported    No   Warm-up and Cool-down Performed on first and last piece of equipment   Resistance Training Performed Yes   VAD Patient? No     Pain Assessment   Currently in Pain? No/denies   Multiple Pain Sites No         Goals Met:  Independence with exercise equipment Exercise tolerated well Personal goals reviewed No report of cardiac concerns or symptoms Strength training completed today  Goals Unmet:  Not Applicable  Comments: Pt able to follow exercise prescription today without complaint.  Will continue to monitor for progression.     [place] Name 08/28/16 1403 11/23/16 1059       6 Minute Walk   Phase Initial Discharge    Distance 1032 feet 814 feet    Distance % Change  - -21.1 %  decreased 218 ft    Walk Time 5.78 minutes  sat down at 5:47 5.53 minutes    # of Rest Breaks _0 sec and 19 sec    MPH 2.03 1.67    METS 2.53 2.23    RPE 17 15    Perceived Dyspnea  3 3    VO2 Peak 7.47 5.36    Symptoms Yes (comment) Yes (comment)    Comments SOB, tightness/fatigue in thighs SOB, thigh pain on right leg    Resting HR 85 bpm 92 bpm    Resting BP 106/66 112/68    Max Ex. HR 99 bpm 102 bpm    Max Ex. BP 122/64 102/58     2 Minute Post BP 106/54  -       [name]'s progress in rehab has been limited by his breathing and his changes in fluid, especially in the legs. He is not able to walk up stairs or for extended periods of time at a quick pace like before his surgery. He is scheduled to meet with Dr. Haroldine Laws next week for re-evaluaton of his heart and lungs. Today his weight was 220lbs. His blood pressures have all been good. He does seem to still be overloaded with fluid with a tight abdomen and right leg.  Dr. Emily Filbert is Medical Director for [place] and [facility].

## 2016-11-27 ENCOUNTER — Other Ambulatory Visit: Payer: Self-pay | Admitting: Physician Assistant

## 2016-11-28 ENCOUNTER — Encounter: Payer: PPO | Admitting: Respiratory Therapy

## 2016-11-28 VITALS — Ht 70.25 in | Wt 234.0 lb

## 2016-11-28 DIAGNOSIS — Z951 Presence of aortocoronary bypass graft: Secondary | ICD-10-CM | POA: Diagnosis not present

## 2016-11-28 NOTE — Progress Notes (Signed)
Daily Session Note  Patient Details  Name: Tommy Sullivan MRN: [id number] Date of Birth: 1946-06-04 Referring Provider:   Flowsheet Row Cardiac Rehab from 08/28/2016 in [facility] Cardiac and Pulmonary Rehab  Referring Provider  Kathlyn Sacramento MD      Encounter Date: 11/28/2016  Check In:     Session Check In - 11/28/16 0829      Check-In   Location [facility]-Cardiac & Pulmonary Rehab   Staff Present Alberteen Sam, MA, ACSM RCEP, Exercise Physiologist;Susanne Bice, RN, BSN, CCRP;Laureen Owens Shark, BS, RRT, Respiratory Therapist   Supervising physician immediately available to respond to emergencies See telemetry face sheet for immediately available ER MD   Medication changes reported     No   Fall or balance concerns reported    No   Warm-up and Cool-down Performed on first and last piece of equipment   Resistance Training Performed Yes   VAD Patient? No     Pain Assessment   Currently in Pain? No/denies   Multiple Pain Sites No         History  Smoking Status   Former Smoker   Packs/day: 1.00   Years: 20.00   Types: Cigarettes   Quit date: 10/02/1988  Smokeless Tobacco   Never Used    Comment: quit in the 1990's    Goals Met:  Proper associated with RPD/PD & O2 Sat Independence with exercise equipment Exercise tolerated well No report of cardiac concerns or symptoms Strength training completed today  Goals Unmet:  Not Applicable  Comments: Pt able to follow exercise prescription today without complaint.  Will continue to monitor for progression.   Dr. Emily Filbert is Medical Director for [place] and [facility].

## 2016-11-28 NOTE — Patient Instructions (Signed)
Discharge Instructions  Patient Details  Name: Tommy Sullivan MRN: [id number] Date of Birth: [birth year] Referring Provider:  Wellington Hampshire, MD   Number of Visits: 36/36  Reason for Discharge:  Patient reached a stable level of exercise. Patient independent in their exercise.  Completed 36 sessions.  Smoking History:  History  Smoking Status  . Former Smoker  . Packs/day: 1.00  . Years: 20.00  . Types: Cigarettes  . Quit date: 10/02/1988  Smokeless Tobacco  . Never Used    Comment: quit in the 1990's    Diagnosis:  S/P CABG x 5  Initial Exercise Prescription:     Initial Exercise Prescription - 08/28/16 1400      Date of Initial Exercise RX and Referring Provider   Date 08/28/16   Referring Provider Kathlyn Sacramento MD     Treadmill   MPH 1.5   Grade 0   Minutes 15   METs 2.15     NuStep   Level 1   SPM --  60-80 spm   Minutes 15   METs 2     Recumbant Elliptical   Level 1   RPM 50   Minutes 15   METs 2     Prescription Details   Frequency (times per week) 2   Duration Progress to 45 minutes of aerobic exercise without signs/symptoms of physical distress     Intensity   THRR 40-80% of Max Heartrate 111-137   Ratings of Perceived Exertion 11-15   Perceived Dyspnea 0-4     Progression   Progression Continue to progress workloads to maintain intensity without signs/symptoms of physical distress.     Resistance Training   Training Prescription Yes   Weight 3 lbs   Reps 10-12      Discharge Exercise Prescription (Final Exercise Prescription Changes):     Exercise Prescription Changes - 11/21/16 1600      Response to Exercise   Blood Pressure (Admit) 102/70   Blood Pressure (Exercise) 128/70   Blood Pressure (Exit) 124/72   Heart Rate (Admit) 80 bpm   Heart Rate (Exercise) 114 bpm   Heart Rate (Exit) 83 bpm   Rating of Perceived Exertion (Exercise) 13   Symptoms none   Comments Home Exercise Guidelines given 09/14/16   Duration  Progress to 45 minutes of aerobic exercise without signs/symptoms of physical distress   Intensity THRR unchanged     Progression   Progression Continue to progress workloads to maintain intensity without signs/symptoms of physical distress.   Average METs 2.03     Resistance Training   Training Prescription Yes   Weight 3 lbs   Reps 10-12     Interval Training   Interval Training No     Treadmill   MPH 1.8   Grade 0.5   Minutes 15   METs 2.5     NuStep   Level 1   Minutes 15   METs 2.2     Recumbant Elliptical   Level 1   Minutes 15   METs 1.4     Home Exercise Plan   Plans to continue exercise at Home  walking, treadmill, elliptical, home gym   Frequency Add 3 additional days to program exercise sessions.     Exercise Review   Progression Yes      Functional Capacity:     6 Minute Walk    Row Name 08/28/16 1403 11/23/16 1059       6 Minute Walk   Phase  Initial Discharge    Distance 1032 feet 814 feet    Distance % Change  - -21.1 %  decreased 218 ft    Walk Time 5.78 minutes  sat down at 5:47 5.53 minutes    # of Rest Breaks 1 2  9  sec and 19 sec    MPH 2.03 1.67    METS 2.53 2.23    RPE 17 15    Perceived Dyspnea  3 3    VO2 Peak 7.47 5.36    Symptoms Yes (comment) Yes (comment)    Comments SOB, tightness/fatigue in thighs SOB, thigh pain on right leg    Resting HR 85 bpm 92 bpm    Resting BP 106/66 112/68    Max Ex. HR 99 bpm 102 bpm    Max Ex. BP 122/64 102/58    2 Minute Post BP 106/54  -       Quality of Life:     Quality of Life - 11/14/16 1450      Quality of Life Scores   Health/Function Pre 22.71 %   Health/Function Post 22.96 %   Health/Function % Change 1.1 %   Socioeconomic Pre 28.29 %   Socioeconomic Post 25.92 %   Socioeconomic % Change  -8.38 %   Psych/Spiritual Pre 29.14 %   Psych/Spiritual Post 27.93 %   Psych/Spiritual % Change -4.15 %   Family Pre 27 %   Family Post 27 %   Family % Change 0 %   GLOBAL Pre  25.88 %   GLOBAL Post 25.33 %   GLOBAL % Change -2.13 %      Personal Goals: Goals established at orientation with interventions provided to work toward goal.     Personal Goals and Risk Factors at Admission - 08/28/16 1320      Core Components/Risk Factors/Patient Goals on Admission    Weight Management Yes;Obesity   Intervention Weight Management: Develop a combined nutrition and exercise program designed to reach desired caloric intake, while maintaining appropriate intake of nutrient and fiber, sodium and fats, and appropriate energy expenditure required for the weight goal.   Admit Weight 223 lb (101.2 kg)   Goal Weight: Short Term 218 lb (98.9 kg)   Goal Weight: Long Term 200 lb (90.7 kg)   Expected Outcomes Short Term: Continue to assess and modify interventions until short term weight is achieved;Long Term: Adherence to nutrition and physical activity/exercise program aimed toward attainment of established weight goal;Weight Maintenance: Understanding of the daily nutrition guidelines, which includes 25-35% calories from fat, 7% or less cal from saturated fats, less than 200mg  cholesterol, less than 1.5gm of sodium, & 5 or more servings of fruits and vegetables daily   Sedentary Yes   Intervention Provide advice, education, support and counseling about physical activity/exercise needs.;Develop an individualized exercise prescription for aerobic and resistive training based on initial evaluation findings, risk stratification, comorbidities and participant's personal goals.   Expected Outcomes Achievement of increased cardiorespiratory fitness and enhanced flexibility, muscular endurance and strength shown through measurements of functional capacity and personal statement of participant.   Increase Strength and Stamina Yes  Able to climb stairs without SOB   Intervention Provide advice, education, support and counseling about physical activity/exercise needs.;Develop an individualized  exercise prescription for aerobic and resistive training based on initial evaluation findings, risk stratification, comorbidities and participant's personal goals.   Expected Outcomes Achievement of increased cardiorespiratory fitness and enhanced flexibility, muscular endurance and strength shown through measurements of functional capacity  and personal statement of participant.   Diabetes Yes   Intervention Provide education about signs/symptoms and action to take for hypo/hyperglycemia.;Provide education about proper nutrition, including hydration, and aerobic/resistive exercise prescription along with prescribed medications to achieve blood glucose in normal ranges: Fasting glucose 65-99 mg/dL   Expected Outcomes Short Term: Participant verbalizes understanding of the signs/symptoms and immediate care of hyper/hypoglycemia, proper foot care and importance of medication, aerobic/resistive exercise and nutrition plan for blood glucose control.;Long Term: Attainment of HbA1C < 7%.   Heart Failure Yes   Intervention Provide a combined exercise and nutrition program that is supplemented with education, support and counseling about heart failure. Directed toward relieving symptoms such as shortness of breath, decreased exercise tolerance, and extremity edema.   Expected Outcomes Improve functional capacity of life;Short term: Attendance in program 2-3 days a week with increased exercise capacity. Reported lower sodium intake. Reported increased fruit and vegetable intake. Reports medication compliance.;Short term: Daily weights obtained and reported for increase. Utilizing diuretic protocols set by physician.;Long term: Adoption of self-care skills and reduction of barriers for early signs and symptoms recognition and intervention leading to self-care maintenance.   Hypertension Yes   Intervention Provide education on lifestyle modifcations including regular physical activity/exercise, weight management,  moderate sodium restriction and increased consumption of fresh fruit, vegetables, and low fat dairy, alcohol moderation, and smoking cessation.;Monitor prescription use compliance.   Expected Outcomes Short Term: Continued assessment and intervention until BP is < 140/63mm HG in hypertensive participants. < 130/49mm HG in hypertensive participants with diabetes, heart failure or chronic kidney disease.;Long Term: Maintenance of blood pressure at goal levels.   Lipids Yes   Intervention Provide education and support for participant on nutrition & aerobic/resistive exercise along with prescribed medications to achieve LDL 70mg , HDL >40mg .   Expected Outcomes Short Term: Participant states understanding of desired cholesterol values and is compliant with medications prescribed. Participant is following exercise prescription and nutrition guidelines.;Long Term: Cholesterol controlled with medications as prescribed, with individualized exercise RX and with personalized nutrition plan. Value goals: LDL < 70mg , HDL > 40 mg.       Personal Goals Discharge:     Goals and Risk Factor Review - 11/23/16 1101      Core Components/Risk Factors/Patient Goals Review   Personal Goals Review Weight Management/Obesity;Sedentary;Increase Strength and Stamina;Hypertension;Lipids   Review [name]'s progress in rehab has been limited by his breathing and his changes in fluid, especially in the legs.  He is not able to walk up stairs or for extended periods of time at a quick pace like before his surgery. He is scheduled to meet with Dr. Haroldine Laws next week for re-evaluaton of his heart and lungs.  Today his weight was 220lbs.  His blood pressures have all been good.  He does seem to still be overloaded with fluid with a tight abdomen and right leg.   Expected Outcomes Short: Ronalee Belts will continue to exercise and possible look at continuin rehab in [facility] or [facility].  Long: Ronalee Belts will continue to work on his risk factors  and with medical team for improved SOB/swelling.      Nutrition & Weight - Outcomes:     Pre Biometrics - 08/28/16 1411      Pre Biometrics   Height 5' 10.25" (1.784 m)   Weight 223 lb (101.2 kg)   Waist Circumference 40.5 inches   Hip Circumference 42.5 inches   Waist to Hip Ratio 0.95 %   BMI (Calculated) 31.8   Single  Leg Stand 6.54 seconds         Post Biometrics - 11/28/16 1036       Post  Biometrics   Height 5' 10.25" (1.784 m)   Weight 234 lb (106.1 kg)   Waist Circumference 42.5 inches   Hip Circumference 43.25 inches   Waist to Hip Ratio 0.98 %   BMI (Calculated) 33.4   Single Leg Stand 7.61 seconds      Nutrition:     Nutrition Therapy & Goals - 10/26/16 1152      Nutrition Therapy   Diet Instructed patient and his wife on an 1800 calorie meal plan including dietary guidelines for heart healthy eating as well as for diabetes.   Drug/Food Interactions Statins/Certain Fruits   Protein (specify units) 8   Fiber 20 grams   Whole Grain Foods 3 servings   Saturated Fats 12 max. grams   Fruits and Vegetables 5 servings/day  with optimal goal of 8 servings daily.   Sodium 1500 grams     Personal Nutrition Goals   Nutrition Goal Monitor food/beverage intake and record.   Personal Goal #2 Continue to read label for saturated fat, trans fat and sodium.   Comments Patient has already made significant positive diet changes. He reports his main challenge regarding diet is striving to keep sodium within guidelines.     Intervention Plan   Intervention Prescribe, educate and counsel regarding individualized specific dietary modifications aiming towards targeted core components such as weight, hypertension, lipid management, diabetes, heart failure and other comorbidities.;Nutrition handout(s) given to patient.   Expected Outcomes Short Term Goal: Understand basic principles of dietary content, such as calories, fat, sodium, cholesterol and nutrients.;Short Term  Goal: A plan has been developed with personal nutrition goals set during dietitian appointment.;Long Term Goal: Adherence to prescribed nutrition plan.      Nutrition Discharge:     Nutrition Assessments - 11/14/16 1449      Rate Your Plate Scores   Pre Score 76   Pre Score % 87.4 %   Post Score 82   Post Score % 94.3 %   % Change 6.9 %      Education Questionnaire Score:     Knowledge Questionnaire Score - 11/14/16 1449      Knowledge Questionnaire Score   Pre Score 18/28   Post Score 27/28      Goals reviewed with patient; copy given to patient.

## 2016-11-29 ENCOUNTER — Ambulatory Visit (HOSPITAL_COMMUNITY)
Admission: RE | Admit: 2016-11-29 | Discharge: 2016-11-29 | Disposition: A | Discharge status: Home / Self Care | Payer: PPO | Source: Ambulatory Visit | Attending: Internal Medicine | Admitting: Internal Medicine

## 2016-11-29 ENCOUNTER — Encounter (HOSPITAL_COMMUNITY): Payer: Self-pay | Admitting: Internal Medicine

## 2016-11-29 ENCOUNTER — Encounter: Payer: Self-pay | Admitting: *Deleted

## 2016-11-29 VITALS — BP 108/78 | HR 87 | Wt 234.5 lb

## 2016-11-29 DIAGNOSIS — I252 Old myocardial infarction: Secondary | ICD-10-CM | POA: Insufficient documentation

## 2016-11-29 DIAGNOSIS — Z7982 Long term (current) use of aspirin: Secondary | ICD-10-CM | POA: Insufficient documentation

## 2016-11-29 DIAGNOSIS — Z833 Family history of diabetes mellitus: Secondary | ICD-10-CM | POA: Insufficient documentation

## 2016-11-29 DIAGNOSIS — Z79899 Other long term (current) drug therapy: Secondary | ICD-10-CM | POA: Insufficient documentation

## 2016-11-29 DIAGNOSIS — I2510 Atherosclerotic heart disease of native coronary artery without angina pectoris: Secondary | ICD-10-CM | POA: Diagnosis not present

## 2016-11-29 DIAGNOSIS — M109 Gout, unspecified: Secondary | ICD-10-CM | POA: Insufficient documentation

## 2016-11-29 DIAGNOSIS — I5023 Acute on chronic systolic (congestive) heart failure: Secondary | ICD-10-CM | POA: Insufficient documentation

## 2016-11-29 DIAGNOSIS — F17210 Nicotine dependence, cigarettes, uncomplicated: Secondary | ICD-10-CM | POA: Insufficient documentation

## 2016-11-29 DIAGNOSIS — I255 Ischemic cardiomyopathy: Secondary | ICD-10-CM | POA: Insufficient documentation

## 2016-11-29 DIAGNOSIS — Z794 Long term (current) use of insulin: Secondary | ICD-10-CM | POA: Insufficient documentation

## 2016-11-29 DIAGNOSIS — I4892 Unspecified atrial flutter: Secondary | ICD-10-CM | POA: Insufficient documentation

## 2016-11-29 DIAGNOSIS — Z951 Presence of aortocoronary bypass graft: Secondary | ICD-10-CM | POA: Insufficient documentation

## 2016-11-29 DIAGNOSIS — J45909 Unspecified asthma, uncomplicated: Secondary | ICD-10-CM | POA: Insufficient documentation

## 2016-11-29 DIAGNOSIS — I110 Hypertensive heart disease with heart failure: Secondary | ICD-10-CM | POA: Insufficient documentation

## 2016-11-29 DIAGNOSIS — Z9889 Other specified postprocedural states: Secondary | ICD-10-CM | POA: Insufficient documentation

## 2016-11-29 DIAGNOSIS — E785 Hyperlipidemia, unspecified: Secondary | ICD-10-CM | POA: Diagnosis not present

## 2016-11-29 DIAGNOSIS — H409 Unspecified glaucoma: Secondary | ICD-10-CM | POA: Insufficient documentation

## 2016-11-29 DIAGNOSIS — Z8249 Family history of ischemic heart disease and other diseases of the circulatory system: Secondary | ICD-10-CM | POA: Diagnosis not present

## 2016-11-29 DIAGNOSIS — E119 Type 2 diabetes mellitus without complications: Secondary | ICD-10-CM | POA: Diagnosis not present

## 2016-11-29 DIAGNOSIS — I5021 Acute systolic (congestive) heart failure: Secondary | ICD-10-CM

## 2016-11-29 LAB — HM DIABETES EYE EXAM

## 2016-11-29 MED ORDER — POTASSIUM CHLORIDE ER 20 MEQ PO TBCR: 20.0000 meq | EXTENDED_RELEASE_TABLET | Freq: Every day | ORAL | 3 refills | Status: DC

## 2016-11-29 MED ORDER — TORSEMIDE 20 MG PO TABS: ORAL_TABLET | ORAL | 2 refills | Status: DC

## 2016-11-29 NOTE — Progress Notes (Signed)
ADVANCED HEART FAILURE CLINIC CONSULT NOTE   Date:  11/29/2016   ID:  KENSHIN SPLAWN, DOB May 29, 1946, MRN [id number]  PCP:  Eliezer Lofts, MD  Cardiologist:   Fletcher Anon Referring: Prescott Gum   History of Present Illness: Tommy Sullivan is a 71 y.o. male with a h/o CAD s/p CABG, moderate COPD, DM2, OSA on CPAP, HTN, chronic systolic heart failure (most recent EF 40-45%) and atrial flutter status post ablation who is referred by Dr. Fletcher Anon for evaluation in the HF Clinic.   He had amyocardial infarction  in 2009 with PCI done by Dr. Collene Mares. He underwent atrial flutter ablation in August 2015 by Dr. Caryl Comes.  He was seen in October for significant worsening of exertional dyspnea. He underwent a stress test which showed evidence of prior inferior infarct with a decrease in ejection fraction. He underwent cardiac catheterization which showed severe three-vessel coronary artery disease with an ejection fraction of 45%. The patient underwent CABG by Dr. Prescott Gum in 10/17. He had postoperative atrial fibrillation which was successfully treated with amiodarone. The patient developed volume overload that required continuation of torsemide.   He had an echocardiogram done in October after CABG which showed a drop in LV systolic function with an EF of 30-35%. He had a repeat echocardiogram 1/18 which showed an EF of 40-45%, moderate mitral regurgitation, moderate to severe tricuspid regurgitation with peak systolic pressure of 41 mmHg. He was started on spironolactone in addition to torsemide and volume status improved.   He is a very circumlocuitous historian. Says that when he first started Cardiac Rehab started to improve. Felt Ok in November and December but couldn't walk very far due to SOB and right thigh cramping. Had ABIs which were ok.  Saw Dr. Prescott Gum in January and spironolactone added. Has been off losartan due to low BP  Graduates CR tomorrow. Weight has gone down from 234 to 227 which he has  maintained since early February. Has not taken extra torsemide. No orthopnea, PND. Has significant bilateral LE edema. Still struggling with ADLs and very hard to go up steps. +bendopnea. Minimal CP. Uses CPAP rarely.  PFTs FEV1 1.85 (58%) FVC  3.01 (69%) DLCO 79%  Past Medical History:  Diagnosis Date  . Allergy   . Arthritis   . Atrial flutter ([place])    a/ 05/2014 s/p RFCA.  Marland Kitchen CAD (coronary artery disease) 10/2007   a. 10/2007 s/p MI/PCI of RCA [facility] - Dr. Collene Mares);  b. 06/2016 MV: EF 34%, inf/inflat infarct; c. 07/2016 Cath: LM nl, LAd 90ost/prox, 52m 99d, D2 30, RI small, LCX 1069mOm1 40, RCA 50ost, 5 ISR, 9074m0d, RPDA  80ost, RPL2 70, EF 45-50%;  d. 07/2016 CABG x 5 (LIMA->LAD, VG->Diag, VG->RI, VG->PDA->RPL).  . Chronic systolic [place]F [place]ive [place]  a. 07/2016 Echo: Ef 30-35%.  . Diabetes mellitus without complication ([facility] . Extrinsic asthma, unspecified   . Glaucoma   . Gout   . Hyperlipidemia   . Hypertensive heart disease   . Ischemic cardiomyopathy    a. 07/2016 Echo: Ef 30-35%, mildly dil LA, mild MR/TR, PASP 24 mmHg.  . 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 07/2016 - placed on amio post-op.  . RMarland Kitchentinal vascular occlusion, unspecified    Right  . Sleep apnea     Past Surgical History:  Procedure Laterality Date  . ABLATION  05-07-2014   EPS and ablation of atrial flutter by Dr  Canton  . ATRIAL FLUTTER ABLATION N/A 05/07/2014   Procedure: ATRIAL FLUTTER ABLATION;  Surgeon: Deboraha Sprang, MD;  Location: [facility] CATH LAB;  Service: Cardiovascular;  Laterality: N/A;  . CARDIAC CATHETERIZATION Left 07/10/2016   Procedure: Left Heart Cath and Coronary Angiography;  Surgeon: Wellington Hampshire, MD;  Location: [place] CV LAB;  Service: Cardiovascular;  Laterality: Left;  . COLONOSCOPY    . CORONARY ANGIOPLASTY  2009   stent [place]  . CORONARY ARTERY BYPASS GRAFT N/A 07/14/2016   Procedure: CORONARY ARTERY BYPASS  GRAFTING (CABG), ON PUMP, TIMES FIVE, USING LEFT INTERNAL MAMMARY ARTERY, RIGHT GREATER SAPHENOUS VEIN HARVESTED ENDOSCOPICALLY;  Surgeon: Ivin Poot, MD;  Location: [place];  Service: Open Heart Surgery;  Laterality: N/A;  SVG to PD and PL sequential SVG to 1st diagonal SVG to Ramus LIMA to LAD  . NECK SURGERY  1990's   x2, Fusion  . SHOULDER ARTHROSCOPY WITH SUBACROMIAL DECOMPRESSION Left 04/02/2014   Procedure: SHOULDER ARTHROSCOPY WITH SUBACROMIAL DECOMPRESSION WITH ROTATOR CUFF REPAIR;  Surgeon: Nita Sells, MD;  Location: [place];  Service: Orthopedics;  Laterality: Left;  Left shoulder rotator cuff repair, subacromial decompression  . SKIN SURGERY    . TEE WITHOUT CARDIOVERSION N/A 07/14/2016   Procedure: TRANSESOPHAGEAL ECHOCARDIOGRAM (TEE);  Surgeon: Ivin Poot, MD;  Location: [place];  Service: Open Heart Surgery;  Laterality: N/A;  . TONSILLECTOMY       Current Outpatient Prescriptions  Medication Sig Dispense Refill  . aspirin EC 325 MG EC tablet Take 1 tablet (325 mg total) by mouth daily. 30 tablet 0  . Blood Glucose Monitoring Suppl (ONETOUCH VERIO IQ SYSTEM) w/Device KIT Check blood sugar twice a day and as directed. Dx E11.9 1 kit 0  . budesonide-formoterol (SYMBICORT) 160-4.5 MCG/ACT inhaler Inhale 2 puffs into the lungs 2 (two) times daily. 1 Inhaler 0  . Cholecalciferol (VITAMIN D3) 1000 units CAPS Take 1,000 Units by mouth daily.     . fenofibrate 160 MG tablet Take 1 tablet (160 mg total) by mouth daily. 90 tablet 3  . glucose blood (ONETOUCH VERIO) test strip Check blood sugar twice a day and as directed. Dx E11.9 100 each 5  . Insulin Glargine (LANTUS SOLOSTAR) 100 UNIT/ML Solostar Pen Inject 14 Units into the skin 2 (two) times daily.    . Insulin Pen Needle (BD PEN NEEDLE NANO U/F) 32G X 4 MM MISC Use to inject lantus twice a day and as instructed. Dx E11.9 100 each 6  . LANTUS SOLOSTAR 100 UNIT/ML Solostar Pen     . latanoprost  (XALATAN) 0.005 % ophthalmic solution Place 1 drop into the left eye at bedtime.   3  . metoprolol tartrate (LOPRESSOR) 25 MG tablet Take 12.5 mg by mouth 2 (two) times daily.    . modafinil (PROVIGIL) 200 MG tablet Take 1 tablet (200 mg total) by mouth daily. 90 tablet 3  . Multiple Vitamins-Minerals (MULTIVITAMIN MEN PO) Take 1 tablet by mouth daily.     . Omega-3 Fatty Acids (FISH OIL) 1000 MG CAPS Take 2,000 capsules by mouth 2 (two) times daily.     . ONE TOUCH LANCETS MISC Check blood sugar twice a day and as directed. Dx E11.9 100 each 5  . rosuvastatin (CRESTOR) 10 MG tablet Take 10 mg by mouth every night 90 tablet 3  . spironolactone (ALDACTONE) 25 MG tablet Take 25 mg by mouth daily.    Marland Kitchen  torsemide (DEMADEX) 20 MG tablet Take 1 tablet (20 mg total) by mouth daily. 90 tablet 2  . tamsulosin (FLOMAX) 0.4 MG CAPS capsule Take 0.4 mg by mouth in the every other night (Patient not taking: Reported on 11/09/2016) 45 capsule 3   No current facility-administered medications for this encounter.     Allergies:   No known allergies    Social History:  The patient  reports that he quit smoking about 28 years ago. His smoking use included Cigarettes. He has a 20.00 pack-year smoking history. He has never used smokeless tobacco. He reports that he drinks about 1.8 oz of alcohol per week . He reports that he does not use drugs.   Family History:  The patient's family history includes AAA (abdominal aortic aneurysm) in his father; Coronary artery disease in his brother; Diabetes in his brother, mother, and sister; Heart disease in his mother.    ROS:  Please see the history of present illness.   Otherwise, review of systems are positive for none.   All other systems are reviewed and negative.    PHYSICAL EXAM: VS:  BP 108/78   Pulse 87   Wt 234 lb 8 oz (106.4 kg)   SpO2 100%   BMI 33.41 kg/m  , BMI Body mass index is 33.41 kg/m. General:  Well appearing. No resp difficulty HEENT:  normal Neck: supple. JVP to ear. Carotids 2+ bilat; no bruits. No lymphadenopathy or thryomegaly appreciated. Cor: PMI nondisplaced. Regular rate & rhythm. No rubs, gallops or murmurs. Lungs: clear Abdomen: soft, nontender, mildly distended. No hepatosplenomegaly. No bruits or masses. Good bowel sounds. Extremities: no cyanosis, clubbing, rash, 2-3+ edema R>L Neuro: alert & orientedx3, cranial nerves grossly intact. moves all 4 extremities w/o difficulty. Affect pleasant   Recent Labs: 07/11/2016: TSH 3.712 07/19/2016: Magnesium 2.2 08/03/2016: Brain Natriuretic Peptide 485.2; Hemoglobin 11.6; Platelets 267 11/01/2016: ALT 19; BUN 26; Creatinine, Ser 1.41; Potassium 4.2; Sodium 136    Lipid Panel    Component Value Date/Time   CHOL 95 11/01/2016 0811   TRIG 84.0 11/01/2016 0811   HDL 20.00 (L) 11/01/2016 0811   CHOLHDL 5 11/01/2016 0811   VLDL 16.8 11/01/2016 0811   LDLCALC 58 11/01/2016 0811   LDLDIRECT 71.0 11/24/2015 0831      Wt Readings from Last 3 Encounters:  11/29/16 234 lb 8 oz (106.4 kg)  11/28/16 234 lb (106.1 kg)  11/09/16 227 lb 6.4 oz (103.1 kg)      No flowsheet data found.    ASSESSMENT AND PLAN:   1. Acute on chronic systolic heart failure - due to ICM. Last echo 1/18 EF 40-45% --NYHA III-IIIB in the setting of marked volume overload on exam and by REDS (45%) --Increase torsemide to 40/20 add kcl 20 --Off losartan due to low BP. Will consider losartan at next visit if BP can tolerate --On short-acting lopressor will plan to carvedilol or toprol once volume issues sorted out --Check BMET 1 week --Follow up wih PA/NP 2 weeks --If SOB persists after diuresis can consider CPX though I suspect he will tune up nicely with diuresis  2.  Coronary artery disease involving native coronary arteries s/p CABG 10/17 --No angina: Continue ASA and statin  3. History of atrial flutter status post RF ablation in 2015: No evidence of recurrent arrhythmia.  4.  Essential hypertension: --BP low currently  5. Cognitive dysfunction --Based on my interaction with him today patient seems to have some degree of cognitive dysfunction which he  seems to cover for. Wife agrees. May benefit from Neuro evaluation.  Glori Bickers, MD  11/29/2016 12:10 PM

## 2016-11-29 NOTE — Progress Notes (Signed)
Cardiac Individual Treatment Plan  Patient Details  Name: Tommy Sullivan MRN: [id number] Date of Birth: 1946/01/22 Referring Provider:   Flowsheet Row Cardiac Rehab from 08/28/2016 in [facility] Cardiac and Pulmonary Rehab  Referring Provider  Kathlyn Sacramento MD      Initial Encounter Date:  Flowsheet Row Cardiac Rehab from 08/28/2016 in [facility] Cardiac and Pulmonary Rehab  Date  08/28/16  Referring Provider  Kathlyn Sacramento MD      Visit Diagnosis: S/P CABG x 5  Patient's Home Medications on Admission:  Current Outpatient Prescriptions:  .  aspirin EC 325 MG EC tablet, Take 1 tablet (325 mg total) by mouth daily., Disp: 30 tablet, Rfl: 0 .  Blood Glucose Monitoring Suppl (ONETOUCH VERIO IQ SYSTEM) w/Device KIT, Check blood sugar twice a day and as directed. Dx E11.9, Disp: 1 kit, Rfl: 0 .  budesonide-formoterol (SYMBICORT) 160-4.5 MCG/ACT inhaler, Inhale 2 puffs into the lungs 2 (two) times daily., Disp: 1 Inhaler, Rfl: 0 .  Cholecalciferol (VITAMIN D3) 1000 units CAPS, Take 1,000 Units by mouth daily. , Disp: , Rfl:  .  fenofibrate 160 MG tablet, Take 1 tablet (160 mg total) by mouth daily., Disp: 90 tablet, Rfl: 3 .  glucose blood (ONETOUCH VERIO) test strip, Check blood sugar twice a day and as directed. Dx E11.9, Disp: 100 each, Rfl: 5 .  Insulin Glargine (LANTUS SOLOSTAR) 100 UNIT/ML Solostar Pen, Inject 14 Units into the skin 2 (two) times daily., Disp: , Rfl:  .  Insulin Pen Needle (BD PEN NEEDLE NANO U/F) 32G X 4 MM MISC, Use to inject lantus twice a day and as instructed. Dx E11.9, Disp: 100 each, Rfl: 6 .  LANTUS SOLOSTAR 100 UNIT/ML Solostar Pen, , Disp: , Rfl:  .  latanoprost (XALATAN) 0.005 % ophthalmic solution, Place 1 drop into the left eye at bedtime. , Disp: , Rfl: 3 .  metoprolol tartrate (LOPRESSOR) 25 MG tablet, Take 12.5 mg by mouth 2 (two) times daily., Disp: , Rfl:  .  modafinil (PROVIGIL) 200 MG tablet, Take 1 tablet (200 mg total) by mouth daily., Disp: 90  tablet, Rfl: 3 .  Multiple Vitamins-Minerals (MULTIVITAMIN MEN PO), Take 1 tablet by mouth daily. , Disp: , Rfl:  .  Omega-3 Fatty Acids (FISH OIL) 1000 MG CAPS, Take 2,000 capsules by mouth 2 (two) times daily. , Disp: , Rfl:  .  ONE TOUCH LANCETS MISC, Check blood sugar twice a day and as directed. Dx E11.9, Disp: 100 each, Rfl: 5 .  rosuvastatin (CRESTOR) 10 MG tablet, Take 10 mg by mouth every night, Disp: 90 tablet, Rfl: 3 .  spironolactone (ALDACTONE) 25 MG tablet, Take 25 mg by mouth daily., Disp: , Rfl:  .  tamsulosin (FLOMAX) 0.4 MG CAPS capsule, Take 0.4 mg by mouth in the every other night (Patient not taking: Reported on 11/09/2016), Disp: 45 capsule, Rfl: 3 .  torsemide (DEMADEX) 20 MG tablet, Take 1 tablet (20 mg total) by mouth daily., Disp: 90 tablet, Rfl: 2  Past Medical History: Past Medical History:  Diagnosis Date  . Allergy   . Arthritis   . Atrial flutter ([place])    a/ 05/2014 s/p RFCA.  Marland Kitchen CAD (coronary artery disease) 10/2007   a. 10/2007 s/p MI/PCI of RCA [facility] - Dr. Collene Mares);  b. 06/2016 MV: EF 34%, inf/inflat infarct; c. 07/2016 Cath: LM nl, LAd 90ost/prox, 62m 99d, D2 30, RI small, LCX 1056mOm1 40, RCA 50ost, 5 ISR, 90102m0d, RPDA  80ost, RPL2 70, EF  45-50%;  d. 07/2016 CABG x 5 (LIMA->LAD, VG->Diag, VG->RI, VG->PDA->RPL).  . Chronic systolic CHF (congestive heart failure) ([place])    a. 07/2016 Echo: Ef 30-35%.  . Diabetes mellitus without complication ([place])   . Extrinsic asthma, unspecified   . Glaucoma   . Gout   . Hyperlipidemia   . Hypertensive heart disease   . Ischemic cardiomyopathy    a. 07/2016 Echo: Ef 30-35%, mildly dil LA, mild MR/TR, PASP 24 mmHg.  Marland Kitchen Post-op Afib following CABG (07/2016)    a. 07/2016 - placed on amio post-op.  Marland Kitchen Retinal vascular occlusion, unspecified    Right  . Sleep apnea     Tobacco Use: History  Smoking Status  . Former Smoker  . Packs/day: 1.00  . Years: 20.00  . Types: Cigarettes  . Quit date: 10/02/1988  Smokeless  Tobacco  . Never Used    Comment: quit in the 1990's    Labs: Recent Review Flowsheet Data    Labs for ITP Cardiac and Pulmonary Rehab Latest Ref Rng & Units 07/14/2016 07/15/2016 07/16/2016 07/16/2016 11/01/2016   Cholestrol 0 - 200 mg/dL - - - - 95   LDLCALC 0 - 99 mg/dL - - - - 58   LDLDIRECT mg/dL - - - - -   HDL >39.00 mg/dL - - - - 20.00(L)   Trlycerides 0.0 - 149.0 mg/dL - - - - 84.0   Hemoglobin A1c 4.6 - 6.5 % - - - - 6.5   PHART 7.350 - 7.450 7.391 - - 7.346(L) -   PCO2ART 32.0 - 48.0 mmHg 36.8 - - 46.5 -   HCO3 20.0 - 28.0 mmol/L 22.3 - - 25.4 -   TCO2 0 - 100 mmol/L 23 25 - 27 -   ACIDBASEDEF 0.0 - 2.0 mmol/L 2.0 - - 1.0 -   O2SAT % 94.0 - 51.6 94.0 -       Exercise Target Goals:    Exercise Program Goal: Individual exercise prescription set with THRR, safety & activity barriers. Participant demonstrates ability to understand and report RPE using BORG scale, to self-measure pulse accurately, and to acknowledge the importance of the exercise prescription.  Exercise Prescription Goal: Starting with aerobic activity 30 plus minutes a day, 3 days per week for initial exercise prescription. Provide home exercise prescription and guidelines that participant acknowledges understanding prior to discharge.  Activity Barriers & Risk Stratification:     Activity Barriers & Cardiac Risk Stratification - 08/28/16 1330      Activity Barriers & Cardiac Risk Stratification   Activity Barriers Shortness of Breath;Deconditioning;Muscular Weakness;Joint Problems  Shortness of breat occurs only when climbing stairs.  Has been exercising on the treadmill low speed no elevation and can maintain 30 + minutes without symptoms.; shoulder injury from fall 3 years ago, occasional pain   Cardiac Risk Stratification High      6 Minute Walk:     6 Minute Walk    Row Name 08/28/16 1403 11/23/16 1059       6 Minute Walk   Phase Initial Discharge    Distance 1032 feet 814 feet     Distance % Change  - -21.1 %  decreased 218 ft    Walk Time 5.78 minutes  sat down at 5:47 5.53 minutes    # of Rest Breaks 1 2  9  sec and 19 sec    MPH 2.03 1.67    METS 2.53 2.23    RPE 17 15    Perceived Dyspnea  3 3    VO2 Peak 7.47 5.36    Symptoms Yes (comment) Yes (comment)    Comments SOB, tightness/fatigue in thighs SOB, thigh pain on right leg    Resting HR 85 bpm 92 bpm    Resting BP 106/66 112/68    Max Ex. HR 99 bpm 102 bpm    Max Ex. BP 122/64 102/58    2 Minute Post BP 106/54  -       Oxygen Initial Assessment:   Oxygen Re-Evaluation:   Oxygen Discharge (Final Oxygen Re-Evaluation):   Initial Exercise Prescription:     Initial Exercise Prescription - 08/28/16 1400      Date of Initial Exercise RX and Referring Provider   Date 08/28/16   Referring Provider Kathlyn Sacramento MD     Treadmill   MPH 1.5   Grade 0   Minutes 15   METs 2.15     NuStep   Level 1   SPM --  60-80 spm   Minutes 15   METs 2     Recumbant Elliptical   Level 1   RPM 50   Minutes 15   METs 2     Prescription Details   Frequency (times per week) 2   Duration Progress to 45 minutes of aerobic exercise without signs/symptoms of physical distress     Intensity   THRR 40-80% of Max Heartrate 111-137   Ratings of Perceived Exertion 11-15   Perceived Dyspnea 0-4     Progression   Progression Continue to progress workloads to maintain intensity without signs/symptoms of physical distress.     Resistance Training   Training Prescription Yes   Weight 3 lbs   Reps 10-12      Perform Capillary Blood Glucose checks as needed.  Exercise Prescription Changes:     Exercise Prescription Changes    Row Name 08/28/16 1300 09/12/16 1600 09/14/16 1000 09/26/16 1500 10/10/16 1600     Response to Exercise   Blood Pressure (Admit) 106/66 104/60  - 100/66 134/70   Blood Pressure (Exercise) 122/64 118/64  - 132/70 100/64   Blood Pressure (Exit) 106/54 108/68  - 106/64  130/74   Heart Rate (Admit) 85 bpm 79 bpm  - 82 bpm 105 bpm   Heart Rate (Exercise) 99 bpm 108 bpm  - 135 bpm 118 bpm   Heart Rate (Exit) 75 bpm 75 bpm  - 116 bpm 86 bpm   Oxygen Saturation (Admit) 99 %  -  -  -  -   Oxygen Saturation (Exercise) 94 %  -  -  -  -   Rating of Perceived Exertion (Exercise) 17 14 14 15 17    Symptoms SOB, tightness/fatigue in legs none none none none   Comments  -  - Home Exercise Guidelines given 09/14/16 Home Exercise Guidelines given 09/14/16 Home Exercise Guidelines given 09/14/16   Duration  - Progress to 45 minutes of aerobic exercise without signs/symptoms of physical distress Progress to 45 minutes of aerobic exercise without signs/symptoms of physical distress Progress to 45 minutes of aerobic exercise without signs/symptoms of physical distress Progress to 45 minutes of aerobic exercise without signs/symptoms of physical distress   Intensity  - THRR unchanged THRR unchanged THRR unchanged THRR unchanged     Progression   Progression  - Continue to progress workloads to maintain intensity without signs/symptoms of physical distress. Continue to progress workloads to maintain intensity without signs/symptoms of physical distress. Continue to progress workloads to maintain intensity  without signs/symptoms of physical distress. Continue to progress workloads to maintain intensity without signs/symptoms of physical distress.   Average METs  - 1.85 1.85 2.1 2.54     Resistance Training   Training Prescription  - Yes Yes Yes Yes   Weight  - 3 lbs 3 lbs 3 lbs 3 lbs   Reps  - 10-12 10-12 10-12 10-12     Interval Training   Interval Training  - No No No No     Treadmill   MPH  - 1.5 1.5 1.8 1.8   Grade  - 0 0 0.5 0.5   Minutes  - 15 15 15 15    METs  - 2.15 2.15 2.5 2.5     NuStep   Level  - 1 1 1 1    Minutes  - 15 15 15 15    METs  - 1.6 1.6 2.3 3.5     Recumbant Elliptical   Level  - 1 1 1 1    Minutes  - 15 15 15 15    METs  - 1.8 1.8 1.5 1.5      Home Exercise Plan   Plans to continue exercise at  -  - Home  walking, treadmill, elliptical, home gym Home  walking, treadmill, elliptical, home gym Home  walking, treadmill, elliptical, home gym   Frequency  -  - Add 3 additional days to program exercise sessions. Add 3 additional days to program exercise sessions. Add 3 additional days to program exercise sessions.     Exercise Review   Progression -  walk test results Yes Yes Yes Yes   Row Name 10/24/16 1300 11/08/16 1400 11/21/16 1600         Response to Exercise   Blood Pressure (Admit) 106/68 124/62 102/70     Blood Pressure (Exercise) 118/60 106/60 128/70     Blood Pressure (Exit) 92/64 112/60 124/72     Heart Rate (Admit) 74 bpm 91 bpm 80 bpm     Heart Rate (Exercise) 116 bpm 118 bpm 114 bpm     Heart Rate (Exit) 87 bpm 84 bpm 83 bpm     Rating of Perceived Exertion (Exercise) 13 13 13      Symptoms none none none     Comments Home Exercise Guidelines given 09/14/16 Home Exercise Guidelines given 09/14/16 Home Exercise Guidelines given 09/14/16     Duration Progress to 45 minutes of aerobic exercise without signs/symptoms of physical distress Progress to 45 minutes of aerobic exercise without signs/symptoms of physical distress Progress to 45 minutes of aerobic exercise without signs/symptoms of physical distress     Intensity THRR unchanged THRR unchanged THRR unchanged       Progression   Progression Continue to progress workloads to maintain intensity without signs/symptoms of physical distress. Continue to progress workloads to maintain intensity without signs/symptoms of physical distress. Continue to progress workloads to maintain intensity without signs/symptoms of physical distress.     Average METs 2.4 2.2 2.03       Resistance Training   Training Prescription Yes Yes Yes     Weight 3 lbs 3 lbs 3 lbs     Reps 10-12 10-12 10-12       Interval Training   Interval Training No No No       Treadmill   MPH 1.8 1.8  1.8     Grade 0.5 0.5 0.5     Minutes 15 15 15      METs 2.5 2.5 2.5  NuStep   Level 1 1 1      Minutes 15 15 15      METs 3.2 2.6 2.2       Recumbant Elliptical   Level 1 1 1      Minutes 15 15 15      METs 1.4 1.5 1.4       Home Exercise Plan   Plans to continue exercise at Home  walking, treadmill, elliptical, home gym Home  walking, treadmill, elliptical, home gym Home  walking, treadmill, elliptical, home gym     Frequency Add 3 additional days to program exercise sessions. Add 3 additional days to program exercise sessions. Add 3 additional days to program exercise sessions.       Exercise Review   Progression Yes Yes Yes        Exercise Comments:     Exercise Comments    Row Name 08/28/16 1409 09/07/16 0911 09/12/16 1606 09/14/16 1037 09/26/16 1542   Exercise Comments Exercise goal is to get up and down stairs without SOB. First full day of exercise!  Patient was oriented to gym and equipment including functions, settings, policies, and procedures.  Patient's individual exercise prescription and treatment plan were reviewed.  All starting workloads were established based on the results of the 6 minute walk test done at initial orientation visit.  The plan for exercise progression was also introduced and progression will be customized based on patient's performance and goals. Tommy Sullivan is off to a good start with exercise.  His blood sugars have been great!  We will continue to monitor his progression. Reviewed home exercise with pt today.  Pt plans to using treadmill, elliptical and home gym for exercise.  Reviewed THR, pulse, RPE, sign and symptoms, and when to call 911 or MD.  Also discussed weather considerations and indoor options.  Pt voiced understanding. Tommy Sullivan has been doing well in rehab.  Today, we added both speed and incline to his treadmill; he did have to take a break, but more do to incline than speed.  He wants to be able to walk around his neighborhood, but the hills  are his limitation, so we will work on his incline tolerance.  We will continue to monitor his progression.   Hemet Name 10/10/16 1610 10/24/16 1354 11/08/16 1451 11/21/16 1623     Exercise Comments Tommy Sullivan continues to do well in rehab.  He is getting better and lasting longer on the treadmill before taking a rest break.  We will continue to monitor his progression. Tommy Sullivan has been doing well in rehab.  He says that he is feeling better and stronger than when he started.  We will continue to monitor his progress. Tommy Sullivan continues to do well in rehab.  His weight was back down today as they have been tweaking his diuretic and getting fluid off his legs and heart.  Hopefully with the fluid off, his exercise tolerance will improve.  We will continue to monitor his progression. Tommy Sullivan is nearing graduation.  He was due for his walk test last week, but we held off due to his recent changes in his medications.  If he is feeling well, we will aim to do his post 6MWT on Thursday and he will graduate next week.  He plans to continue to exercise at home after graduation.  We will continue to monitor his progression.       Exercise Goals and Review:   Exercise Goals Re-Evaluation :   Discharge Exercise Prescription (Final Exercise Prescription  Changes):     Exercise Prescription Changes - 11/21/16 1600      Response to Exercise   Blood Pressure (Admit) 102/70   Blood Pressure (Exercise) 128/70   Blood Pressure (Exit) 124/72   Heart Rate (Admit) 80 bpm   Heart Rate (Exercise) 114 bpm   Heart Rate (Exit) 83 bpm   Rating of Perceived Exertion (Exercise) 13   Symptoms none   Comments Home Exercise Guidelines given 09/14/16   Duration Progress to 45 minutes of aerobic exercise without signs/symptoms of physical distress   Intensity THRR unchanged     Progression   Progression Continue to progress workloads to maintain intensity without signs/symptoms of physical distress.   Average METs 2.03     Resistance  Training   Training Prescription Yes   Weight 3 lbs   Reps 10-12     Interval Training   Interval Training No     Treadmill   MPH 1.8   Grade 0.5   Minutes 15   METs 2.5     NuStep   Level 1   Minutes 15   METs 2.2     Recumbant Elliptical   Level 1   Minutes 15   METs 1.4     Home Exercise Plan   Plans to continue exercise at Home  walking, treadmill, elliptical, home gym   Frequency Add 3 additional days to program exercise sessions.     Exercise Review   Progression Yes      Nutrition:  Target Goals: Understanding of nutrition guidelines, daily intake of sodium <1558m, cholesterol <2065m calories 30% from fat and 7% or less from saturated fats, daily to have 5 or more servings of fruits and vegetables.  Biometrics:     Pre Biometrics - 08/28/16 1411      Pre Biometrics   Height 5' 10.25" (1.784 m)   Weight 223 lb (101.2 kg)   Waist Circumference 40.5 inches   Hip Circumference 42.5 inches   Waist to Hip Ratio 0.95 %   BMI (Calculated) 31.8   Single Leg Stand 6.54 seconds         Post Biometrics - 11/28/16 1036       Post  Biometrics   Height 5' 10.25" (1.784 m)   Weight 234 lb (106.1 kg)   Waist Circumference 42.5 inches   Hip Circumference 43.25 inches   Waist to Hip Ratio 0.98 %   BMI (Calculated) 33.4   Single Leg Stand 7.61 seconds      Nutrition Therapy Plan and Nutrition Goals:     Nutrition Therapy & Goals - 10/26/16 1152      Nutrition Therapy   Diet Instructed patient and his wife on an 1800 calorie meal plan including dietary guidelines for heart healthy eating as well as for diabetes.   Drug/Food Interactions Statins/Certain Fruits   Protein (specify units) 8   Fiber 20 grams   Whole Grain Foods 3 servings   Saturated Fats 12 max. grams   Fruits and Vegetables 5 servings/day  with optimal goal of 8 servings daily.   Sodium 1500 grams     Personal Nutrition Goals   Nutrition Goal Monitor food/beverage intake and  record.   Personal Goal #2 Continue to read label for saturated fat, trans fat and sodium.   Comments Patient has already made significant positive diet changes. He reports his main challenge regarding diet is striving to keep sodium within guidelines.     Intervention Plan   Intervention  Prescribe, educate and counsel regarding individualized specific dietary modifications aiming towards targeted core components such as weight, hypertension, lipid management, diabetes, heart failure and other comorbidities.;Nutrition handout(s) given to patient.   Expected Outcomes Short Term Goal: Understand basic principles of dietary content, such as calories, fat, sodium, cholesterol and nutrients.;Short Term Goal: A plan has been developed with personal nutrition goals set during dietitian appointment.;Long Term Goal: Adherence to prescribed nutrition plan.      Nutrition Discharge: Rate Your Plate Scores:     Nutrition Assessments - 11/14/16 1449      Rate Your Plate Scores   Pre Score 76   Pre Score % 87.4 %   Post Score 82   Post Score % 94.3 %   % Change 6.9 %      Nutrition Goals Re-Evaluation:     Nutrition Goals Re-Evaluation    Row Name 09/28/16 1028 11/23/16 1105           Goals   Nutrition Goal  - Monitor food/beverage intake and record.      Comment  - Keeping food record to correspond with symptoms        Personal Goal #1 Re-Evaluation   Goal Progress Seen  - Yes        Personal Goal #2 Re-Evaluation   Personal Goal #2  - Continue to read label for saturated fat, trans fat and sodium.      Goal Progress Seen  - Yes      Re-Evaluation  - Reading labels more frequently.        Intervention Plan   Intervention Continue to educate, counsel and set short/long term goals regarding individualized specific personal dietary modifications. Continue to educate, counsel and set short/long term goals regarding individualized specific personal dietary modifications.      Comments  Scheduled dietician appointment for pt on Jan 1/25 at 1030am.  He has been following a heart healthy diet and really trying to control his sodium intake. Continue to work with medical team on weight and will continue to stay on top of his improved diet.         Nutrition Goals Discharge (Final Nutrition Goals Re-Evaluation):     Nutrition Goals Re-Evaluation - 11/23/16 1105      Goals   Nutrition Goal Monitor food/beverage intake and record.   Comment Keeping food record to correspond with symptoms     Personal Goal #1 Re-Evaluation   Goal Progress Seen Yes     Personal Goal #2 Re-Evaluation   Personal Goal #2 Continue to read label for saturated fat, trans fat and sodium.   Goal Progress Seen Yes   Re-Evaluation Reading labels more frequently.     Intervention Plan   Intervention Continue to educate, counsel and set short/long term goals regarding individualized specific personal dietary modifications.   Comments Continue to work with medical team on weight and will continue to stay on top of his improved diet.      Psychosocial: Target Goals: Acknowledge presence or absence of significant depression and/or stress, maximize coping skills, provide positive support system. Participant is able to verbalize types and ability to use techniques and skills needed for reducing stress and depression.   Initial Review & Psychosocial Screening:     Initial Psych Review & Screening - 08/28/16 1327      Initial Review   Current issues with --  none reported     Family Dynamics   Good Support System? Yes  Wife  Barriers   Psychosocial barriers to participate in program There are no identifiable barriers or psychosocial needs.;The patient should benefit from training in stress management and relaxation.     Screening Interventions   Interventions Encouraged to exercise      Quality of Life Scores:      Quality of Life - 11/14/16 1450      Quality of Life Scores    Health/Function Pre 22.71 %   Health/Function Post 22.96 %   Health/Function % Change 1.1 %   Socioeconomic Pre 28.29 %   Socioeconomic Post 25.92 %   Socioeconomic % Change  -8.38 %   Psych/Spiritual Pre 29.14 %   Psych/Spiritual Post 27.93 %   Psych/Spiritual % Change -4.15 %   Family Pre 27 %   Family Post 27 %   Family % Change 0 %   GLOBAL Pre 25.88 %   GLOBAL Post 25.33 %   GLOBAL % Change -2.13 %      PHQ-9: Recent Review Flowsheet Data    Depression screen [facility] 2/9 11/14/2016 11/01/2016 08/28/2016 06/14/2016 04/27/2016   Decreased Interest 0 0 1 0 0   Down, Depressed, Hopeless 0 0 0 0 0   PHQ - 2 Score 0 0 1 0 0   Altered sleeping 0 - 1 - -   Tired, decreased energy 1 - 1 - -   Change in appetite 0 - 0 - -   Feeling bad or failure about yourself  0 - 0 - -   Trouble concentrating 0 - 0 - -   Moving slowly or fidgety/restless 0 - 0 - -   Suicidal thoughts 0 - 0 - -   PHQ-9 Score 1 - 3 - -   Difficult doing work/chores Somewhat difficult - Not difficult at all - -     Interpretation of Total Score  Total Score Depression Severity:  1-4 = Minimal depression, 5-9 = Mild depression, 10-14 = Moderate depression, 15-19 = Moderately severe depression, 20-27 = Severe depression   Psychosocial Evaluation and Intervention:     Psychosocial Evaluation - 09/12/16 0946      Psychosocial Evaluation & Interventions   Interventions Encouraged to exercise with the program and follow exercise prescription   Comments Counselor met with Mr. Gagen (Mr. Lemmie Evens) today for initial psychosocial evaluation.  He is a 71 yr old who had triple bypass in October.  He has a spouse of 59 years and some relatives of his spouse who live locally and provide support for him.  Mr. Lemmie Evens reports having multiple health issues with shoulder and knee problems; diabetes; blind in one eye; and sleep apnea in addition to his cardiac issues.  He states his appetite is good and he denies a history or current symptoms of  depression or anxiety.  Mr. Lemmie Evens reports he is typically in a positive mood and has minimal stress in his life.  He has goals to be able to walk up the stairs without shortness of breath.  He has exercise equipment at home that he will begin using once he is released to do so.  Staff will follow with Mr. Lemmie Evens throughout the course of this program.        Psychosocial Re-Evaluation:     Psychosocial Re-Evaluation    Row Name 10/26/16 [id number] 11/23/16 1107           Psychosocial Re-Evaluation   Comments Tommy Sullivan continues without psychosocial issues. Will continue to review with Legrand Como for any changes.  He has good positive outlook on life.  Tommy Sullivan continues to not have any major stressors.  His biggest concern has been his on going health issues with his breathing and fluid build up.  He is working closely with his medical team that he loves.  He continues to stay positive about everything.      Interventions Encouraged to attend Cardiac Rehabilitation for the exercise Encouraged to attend Cardiac Rehabilitation for the exercise      Continue Psychosocial Services  No  -         Psychosocial Discharge (Final Psychosocial Re-Evaluation):     Psychosocial Re-Evaluation - 11/23/16 1107      Psychosocial Re-Evaluation   Comments Tommy Sullivan continues to not have any major stressors.  His biggest concern has been his on going health issues with his breathing and fluid build up.  He is working closely with his medical team that he loves.  He continues to stay positive about everything.   Interventions Encouraged to attend Cardiac Rehabilitation for the exercise      Vocational Rehabilitation: Provide vocational rehab assistance to qualifying candidates.   Vocational Rehab Evaluation & Intervention:     Vocational Rehab - 08/28/16 1332      Initial Vocational Rehab Evaluation & Intervention   Assessment shows need for Vocational Rehabilitation No      Education: Education Goals: Education classes  will be provided on a weekly basis, covering required topics. Participant will state understanding/return demonstration of topics presented.  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 Gives an explanation and review of dietary fats and fiber. Flowsheet Row Cardiac Rehab from 11/28/2016 in [facility] Cardiac and Pulmonary Rehab  Date  10/10/16  Educator  PI  Instruction Review Code  2- meets goals/outcomes      Controlling Sodium/Reading Food Labels: -Group verbal and written material supporting the discussion of sodium use in heart healthy nutrition. Review and explanation with models, verbal and written materials for utilization of the food label. Flowsheet Row Cardiac Rehab from 11/28/2016 in [facility] Cardiac and Pulmonary Rehab  Date  10/17/16  Educator  LB  Instruction Review Code  2- meets goals/outcomes      Exercise Physiology & Risk Factors: - Group verbal and written instruction with models to review the exercise physiology of the cardiovascular system and associated critical values. Details cardiovascular disease risk factors and the goals associated with each risk factor. Flowsheet Row Cardiac Rehab from 11/28/2016 in [facility] Cardiac and Pulmonary Rehab  Date  10/24/16  Educator  [facility]  Instruction Review Code  2- meets goals/outcomes      Aerobic Exercise & Resistance Training: - Gives group verbal and written discussion on the health impact of inactivity. On the components of aerobic and resistive training programs and the benefits of this training and how to safely progress through these  programs. Flowsheet Row Cardiac Rehab from 11/28/2016 in [facility] Cardiac and Pulmonary Rehab  Date  10/26/16  Educator  AS  Instruction Review Code  2- meets goals/outcomes      Flexibility, Balance, General Exercise Guidelines: - Provides group verbal and written instruction on the benefits of flexibility and balance training  programs. Provides general exercise guidelines with specific guidelines to those with heart or lung disease. Demonstration and skill practice provided.   Stress Management: - Provides group verbal and written instruction about the health risks of elevated stress, cause of high stress, and healthy ways to reduce stress. Flowsheet Row Cardiac Rehab from 11/28/2016 in [facility] Cardiac and Pulmonary Rehab  Date  11/09/16  Educator  TS  Instruction Review Code  2- meets goals/outcomes      Depression: - Provides group verbal and written instruction on the correlation between heart/lung disease and depressed mood, treatment options, and the stigmas associated with seeking treatment. Flowsheet Row Cardiac Rehab from 11/28/2016 in [facility] Cardiac and Pulmonary Rehab  Date  10/12/16  Educator  [facility]  Instruction Review Code  2- meets goals/outcomes      Anatomy & Physiology of the Heart: - Group verbal and written instruction and models provide basic cardiac anatomy and physiology, with the coronary electrical and arterial systems. Review of: AMI, Angina, Valve disease, Heart Failure, Cardiac Arrhythmia, Pacemakers, and the ICD. Flowsheet Row Cardiac Rehab from 11/28/2016 in [facility] Cardiac and Pulmonary Rehab  Date  11/07/16  Educator  CE  Instruction Review Code  2- meets goals/outcomes      Cardiac Procedures: - Group verbal and written instruction and models to describe the testing methods done to diagnose heart disease. Reviews the outcomes of the test results. Describes the treatment choices: Medical Management, Angioplasty, or Coronary Bypass Surgery. Flowsheet Row Cardiac  Rehab from 11/28/2016 in [facility] Cardiac and Pulmonary Rehab  Date  11/14/16  Educator  CE  Instruction Review Code  2- meets goals/outcomes      Cardiac Medications: - Group verbal and written instruction to review commonly prescribed medications for heart disease. Reviews the medication, class of the drug, and side effects. Includes the steps to properly store meds and maintain the prescription regimen. Flowsheet Row Cardiac Rehab from 11/28/2016 in [facility] Cardiac and Pulmonary Rehab  Date  11/21/16 Marisue Humble 2]  Educator  SB  Instruction Review Code  2- meets goals/outcomes      Go Sex-Intimacy & Heart Disease, Get SMART - Goal Setting: - Group verbal and written instruction through game format to discuss heart disease and the return to sexual intimacy. Provides group verbal and written material to discuss and apply goal setting through the application of the S.M.A.R.T. Method. Flowsheet Row Cardiac Rehab from 11/28/2016 in [facility] Cardiac and Pulmonary Rehab  Date  11/14/16  Educator  CE  Instruction Review Code  2- meets goals/outcomes      Other Matters of the Heart: - Provides group verbal, written materials and models to describe Heart Failure, Angina, Valve Disease, and Diabetes in the realm of heart disease. Includes description of the disease process and treatment options available to the cardiac patient. Flowsheet Row Cardiac Rehab from 11/28/2016 in [facility] Cardiac and Pulmonary Rehab  Date  11/07/16  Educator  CE  Instruction Review Code  2- meets goals/outcomes      Exercise & Equipment Safety: - Individual verbal instruction and demonstration of equipment use and safety with use of the equipment. Flowsheet Row Cardiac Rehab from 11/28/2016 in [facility] Cardiac and Pulmonary Rehab  Date  08/28/16  Educator  SB  Instruction Review Code  2- meets goals/outcomes      Infection Prevention: - Provides verbal and written material to individual with discussion of infection control  including proper hand washing and proper equipment cleaning during exercise session. Flowsheet Row Cardiac Rehab from  11/28/2016 in [facility] Cardiac and Pulmonary Rehab  Date  08/28/16  Educator  SB  Instruction Review Code  2- meets goals/outcomes      Falls Prevention: - Provides verbal and written material to individual with discussion of falls prevention and safety. Flowsheet Row Cardiac Rehab from 11/28/2016 in [facility] Cardiac and Pulmonary Rehab  Date  08/28/16  Educator  SB  Instruction Review Code  2- meets goals/outcomes      Diabetes: - Individual verbal and written instruction to review signs/symptoms of diabetes, desired ranges of glucose level fasting, after meals and with exercise. Advice that pre and post exercise glucose checks will be done for 3 sessions at entry of program. Flowsheet Row Cardiac Rehab from 11/28/2016 in [facility] Cardiac and Pulmonary Rehab  Date  08/28/16  Educator  SB  Instruction Review Code  2- meets goals/outcomes       Knowledge Questionnaire Score:     Knowledge Questionnaire Score - 11/14/16 1449      Knowledge Questionnaire Score   Pre Score 18/28   Post Score 27/28      Core Components/Risk Factors/Patient Goals at Admission:     Personal Goals and Risk Factors at Admission - 08/28/16 1320      Core Components/Risk Factors/Patient Goals on Admission    Weight Management Yes;Obesity   Intervention Weight Management: Develop a combined nutrition and exercise program designed to reach desired caloric intake, while maintaining appropriate intake of nutrient and fiber, sodium and fats, and appropriate energy expenditure required for the weight goal.   Admit Weight 223 lb (101.2 kg)   Goal Weight: Short Term 218 lb (98.9 kg)   Goal Weight: Long Term 200 lb (90.7 kg)   Expected Outcomes Short Term: Continue to assess and modify interventions until short term weight is achieved;Long Term: Adherence to nutrition and physical activity/exercise  program aimed toward attainment of established weight goal;Weight Maintenance: Understanding of the daily nutrition guidelines, which includes 25-35% calories from fat, 7% or less cal from saturated fats, less than 289m cholesterol, less than 1.5gm of sodium, & 5 or more servings of fruits and vegetables daily   Sedentary Yes   Intervention Provide advice, education, support and counseling about physical activity/exercise needs.;Develop an individualized exercise prescription for aerobic and resistive training based on initial evaluation findings, risk stratification, comorbidities and participant's personal goals.   Expected Outcomes Achievement of increased cardiorespiratory fitness and enhanced flexibility, muscular endurance and strength shown through measurements of functional capacity and personal statement of participant.   Increase Strength and Stamina Yes  Able to climb stairs without SOB   Intervention Provide advice, education, support and counseling about physical activity/exercise needs.;Develop an individualized exercise prescription for aerobic and resistive training based on initial evaluation findings, risk stratification, comorbidities and participant's personal goals.   Expected Outcomes Achievement of increased cardiorespiratory fitness and enhanced flexibility, muscular endurance and strength shown through measurements of functional capacity and personal statement of participant.   Diabetes Yes   Intervention Provide education about signs/symptoms and action to take for hypo/hyperglycemia.;Provide education about proper nutrition, including hydration, and aerobic/resistive exercise prescription along with prescribed medications to achieve blood glucose in normal ranges: Fasting glucose 65-99 mg/dL   Expected Outcomes Short Term: Participant verbalizes understanding of the signs/symptoms and immediate care of hyper/hypoglycemia, proper foot care and importance of medication,  aerobic/resistive exercise and nutrition plan for blood glucose control.;Long Term: Attainment of HbA1C < 7%.   Heart Failure Yes   Intervention Provide a combined exercise and  nutrition program that is supplemented with education, support and counseling about heart failure. Directed toward relieving symptoms such as shortness of breath, decreased exercise tolerance, and extremity edema.   Expected Outcomes Improve functional capacity of life;Short term: Attendance in program 2-3 days a week with increased exercise capacity. Reported lower sodium intake. Reported increased fruit and vegetable intake. Reports medication compliance.;Short term: Daily weights obtained and reported for increase. Utilizing diuretic protocols set by physician.;Long term: Adoption of self-care skills and reduction of barriers for early signs and symptoms recognition and intervention leading to self-care maintenance.   Hypertension Yes   Intervention Provide education on lifestyle modifcations including regular physical activity/exercise, weight management, moderate sodium restriction and increased consumption of fresh fruit, vegetables, and low fat dairy, alcohol moderation, and smoking cessation.;Monitor prescription use compliance.   Expected Outcomes Short Term: Continued assessment and intervention until BP is < 140/87m HG in hypertensive participants. < 130/872mHG in hypertensive participants with diabetes, heart failure or chronic kidney disease.;Long Term: Maintenance of blood pressure at goal levels.   Lipids Yes   Intervention Provide education and support for participant on nutrition & aerobic/resistive exercise along with prescribed medications to achieve LDL <7093mHDL >74m35m Expected Outcomes Short Term: Participant states understanding of desired cholesterol values and is compliant with medications prescribed. Participant is following exercise prescription and nutrition guidelines.;Long Term: Cholesterol  controlled with medications as prescribed, with individualized exercise RX and with personalized nutrition plan. Value goals: LDL < 70mg55mL > 40 mg.      Core Components/Risk Factors/Patient Goals Review:      Goals and Risk Factor Review    Row Name 09/28/16 1017 10/26/16 0942 11/21/16 1008 11/23/16 1101       Core Components/Risk Factors/Patient Goals Review   Personal Goals Review Weight Management/Obesity;Sedentary;Increase Strength and Stamina;Improve shortness of breath with ADL's;Hypertension;Diabetes;Lipids;Heart Failure Weight Management/Obesity;Increase Strength and Stamina;Diabetes;Hypertension;Lipids Increase knowledge of respiratory medications and ability to use respiratory devices properly. Weight Management/Obesity;Sedentary;Increase Strength and Stamina;Hypertension;Lipids    Review [name] Tommy Beltsff to a good start in rehab.  He is already feeling stronger with increased stamina. Now he can go up the stairs without a break 50% of the time!  He is exercising at home on his bike and elliptical on his off days.  He is currently doing 20 min of exercise, we talked about bumping this up to 30 min.  His balance is still off and he hopes to improve as he continues to get stronger.  He is doing well with his diabetes and heart healthy diet.  His weight fluctuates based off when he takes his dieuretic.  His right leg is still swollen and tight, especialy when compared to left leg.  He is wearing a compression stocking and the MD is aware.  He has no other HF symptoms currently.  He has had no problems with his medications. [name] continues to do well with his exercise and he states that he is noticing increased stamina since satrting the program. Blood sugar is controlled. HAs had med change and is still tweaking the dose to prevent blood sugar from dropping to low. Bllod pressure remains in good range. HAs been below 100 s585olic at times. No major symptoms reported with the lower BP readings.  Reviewed with MichaLegrand Comoet MD know if he has symptoms with the lower BP readings: dizziness, off balance . He voiced iunderstanding of need to inform MD. A1C and other labs due to be drawn next week.  Ejection Fraction has risen to between 40 and 45%.  Mr Gohman was started on Symbicort. I gave him a spacer with instruction to use with the Symbicort. [name]'s progress in rehab has been limited by his breathing and his changes in fluid, especially in the legs.  He is not able to walk up stairs or for extended periods of time at a quick pace like before his surgery. He is scheduled to meet with Dr. Haroldine Laws next week for re-evaluaton of his heart and lungs.  Today his weight was 220lbs.  His blood pressures have all been good.  He does seem to still be overloaded with fluid with a tight abdomen and right leg.    Expected Outcomes Short Term: Tommy Sullivan will increase his exercise time at home to 30 min.  Long Term:  Tommy Sullivan will continue to come to class to work on his risk factors. Short Term: Tommy Sullivan will increase his exercise time at home to 30 min.  Long Term:  Tommy Sullivan will continue to come to class to work on his risk factors. Tommy Sullivan will continue to work with his medical team to pursue the right leg swelling and intermittent pain in right hip when walking.   - Short: Tommy Sullivan will continue to exercise and possible look at continuin rehab in pulmonary or [facility].  Long: Tommy Sullivan will continue to work on his risk factors and with medical team for improved SOB/swelling.       Core Components/Risk Factors/Patient Goals at Discharge (Final Review):      Goals and Risk Factor Review - 11/23/16 1101      Core Components/Risk Factors/Patient Goals Review   Personal Goals Review Weight Management/Obesity;Sedentary;Increase Strength and Stamina;Hypertension;Lipids   Review [name]'s progress in rehab has been limited by his breathing and his changes in fluid, especially in the legs.  He is not able to walk up stairs or for  extended periods of time at a quick pace like before his surgery. He is scheduled to meet with Dr. Haroldine Laws next week for re-evaluaton of his heart and lungs.  Today his weight was 220lbs.  His blood pressures have all been good.  He does seem to still be overloaded with fluid with a tight abdomen and right leg.   Expected Outcomes Short: Tommy Sullivan will continue to exercise and possible look at continuin rehab in [facility] or [facility].  Long: Tommy Sullivan will continue to work on his risk factors and with medical team for improved SOB/swelling.      ITP Comments:     ITP Comments    Row Name 08/28/16 1313 09/06/16 0600 10/03/16 0622 11/01/16 0618 11/29/16 0559   ITP Comments Med review completed. Initial ITP created. Diagnosis documentation can be found in [facility] Encounter 07/10/2016 30 day review completed for review by Dr Emily Filbert.  Continue with ITP unless changes noted by Dr Sabra Heck. 30 day review. Continue with ITP unless directed changes per Medical Director review. 30 day review. Continue with ITP unless directed changes per Medical Director review. 30 day review. Continue with ITP unless directed changes per Medical Director review      Comments:

## 2016-11-29 NOTE — Patient Instructions (Signed)
INCREASE Torsemide to 40mg  in the AM and 20mg  in the PM  START Potassium 85meq daily  Lab work in 1 week (bmet)  Follow up with [facility] clinic in 2 weeks.  Follow up with Dr.Bensimhon in 6 weeks

## 2016-11-29 NOTE — Progress Notes (Unsigned)
    ReDS Vest - 11/29/16 1200      ReDS Vest   MR  No   Fitting Posture Standing   Height Marker Tall   Ruler Value 11   Center Strip Aligned   ReDS Value 42

## 2016-11-30 ENCOUNTER — Encounter: Payer: PPO | Attending: Cardiovascular Disease

## 2016-11-30 DIAGNOSIS — Z951 Presence of aortocoronary bypass graft: Secondary | ICD-10-CM | POA: Diagnosis not present

## 2016-11-30 NOTE — Progress Notes (Signed)
Discharge Summary  Patient Details  Name: Tommy Sullivan MRN: [id number] Date of Birth: 1945-12-09 Referring Provider:   Flowsheet Row Cardiac Rehab from 08/28/2016 in [facility] Cardiac and Pulmonary Rehab  Referring Provider  Kathlyn Sacramento MD       Number of Visits: 36/36  Reason for Discharge:  Patient reached a stable level of exercise. Patient independent in their exercise.   Smoking History:  History  Smoking Status  . Former Smoker  . Packs/day: 1.00  . Years: 20.00  . Types: Cigarettes  . Quit date: 10/02/1988  Smokeless Tobacco  . Never Used    Comment: quit in the 1990's    Diagnosis:  S/P CABG x 5  ADL [facility]:   Initial Exercise Prescription:     Initial Exercise Prescription - 08/28/16 1400      Date of Initial Exercise RX and Referring Provider   Date 08/28/16   Referring Provider Kathlyn Sacramento MD     Treadmill   MPH 1.5   Grade 0   Minutes 15   METs 2.15     NuStep   Level 1   SPM --  60-80 spm   Minutes 15   METs 2     Recumbant Elliptical   Level 1   RPM 50   Minutes 15   METs 2     Prescription Details   Frequency (times per week) 2   Duration Progress to 45 minutes of aerobic exercise without signs/symptoms of physical distress     Intensity   THRR 40-80% of Max Heartrate 111-137   Ratings of Perceived Exertion 11-15   Perceived Dyspnea 0-4     Progression   Progression Continue to progress workloads to maintain intensity without signs/symptoms of physical distress.     Resistance Training   Training Prescription Yes   Weight 3 lbs   Reps 10-12      Discharge Exercise Prescription (Final Exercise Prescription Changes):     Exercise Prescription Changes - 11/21/16 1600      Response to Exercise   Blood Pressure (Admit) 102/70   Blood Pressure (Exercise) 128/70   Blood Pressure (Exit) 124/72   Heart Rate (Admit) 80 bpm   Heart Rate (Exercise) 114 bpm   Heart Rate (Exit) 83 bpm   Rating of Perceived Exertion  (Exercise) 13   Symptoms none   Comments Home Exercise Guidelines given 09/14/16   Duration Progress to 45 minutes of aerobic exercise without signs/symptoms of physical distress   Intensity THRR unchanged     Progression   Progression Continue to progress workloads to maintain intensity without signs/symptoms of physical distress.   Average METs 2.03     Resistance Training   Training Prescription Yes   Weight 3 lbs   Reps 10-12     Interval Training   Interval Training No     Treadmill   MPH 1.8   Grade 0.5   Minutes 15   METs 2.5     NuStep   Level 1   Minutes 15   METs 2.2     Recumbant Elliptical   Level 1   Minutes 15   METs 1.4     Home Exercise Plan   Plans to continue exercise at Home  walking, treadmill, elliptical, home gym   Frequency Add 3 additional days to program exercise sessions.     Exercise Review   Progression Yes      Functional Capacity:     6 Minute Walk  [employer] Name 08/28/16 1403 11/23/16 1059       6 Minute Walk   Phase Initial Discharge    Distance 1032 feet 814 feet    Distance % Change  - -21.1 %  decreased 218 ft    Walk Time 5.78 minutes  sat down at 5:47 5.53 minutes    # of Rest Breaks 1 2  9  sec and 19 sec    MPH 2.03 1.67    METS 2.53 2.23    RPE 17 15    Perceived Dyspnea  3 3    VO2 Peak 7.47 5.36    Symptoms Yes (comment) Yes (comment)    Comments SOB, tightness/fatigue in thighs SOB, thigh pain on right leg    Resting HR 85 bpm 92 bpm    Resting BP 106/66 112/68    Max Ex. HR 99 bpm 102 bpm    Max Ex. BP 122/64 102/58    2 Minute Post BP 106/54  -       Psychological, QOL, Others - Outcomes: PHQ 2/9: Depression screen [facility] 2/9 11/14/2016 11/01/2016 08/28/2016 06/14/2016 04/27/2016  Decreased Interest 0 0 1 0 0  Down, Depressed, Hopeless 0 0 0 0 0  PHQ - 2 Score 0 0 1 0 0  Altered sleeping 0 - 1 - -  Tired, decreased energy 1 - 1 - -  Change in appetite 0 - 0 - -  Feeling bad or failure about yourself   0 - 0 - -  Trouble concentrating 0 - 0 - -  Moving slowly or fidgety/restless 0 - 0 - -  Suicidal thoughts 0 - 0 - -  PHQ-9 Score 1 - 3 - -  Difficult doing work/chores Somewhat difficult - Not difficult at all - -    Quality of Life:     Quality of Life - 11/14/16 1450      Quality of Life Scores   Health/Function Pre 22.71 %   Health/Function Post 22.96 %   Health/Function % Change 1.1 %   Socioeconomic Pre 28.29 %   Socioeconomic Post 25.92 %   Socioeconomic % Change  -8.38 %   Psych/Spiritual Pre 29.14 %   Psych/Spiritual Post 27.93 %   Psych/Spiritual % Change -4.15 %   Family Pre 27 %   Family Post 27 %   Family % Change 0 %   GLOBAL Pre 25.88 %   GLOBAL Post 25.33 %   GLOBAL % Change -2.13 %      Personal Goals: Goals established at orientation with interventions provided to work toward goal.     Personal Goals and Risk Factors at Admission - 08/28/16 1320      Core Components/Risk Factors/Patient Goals on Admission    Weight Management Yes;Obesity   Intervention Weight Management: Develop a combined nutrition and exercise program designed to reach desired caloric intake, while maintaining appropriate intake of nutrient and fiber, sodium and fats, and appropriate energy expenditure required for the weight goal.   Admit Weight 223 lb (101.2 kg)   Goal Weight: Short Term 218 lb (98.9 kg)   Goal Weight: Long Term 200 lb (90.7 kg)   Expected Outcomes Short Term: Continue to assess and modify interventions until short term weight is achieved;Long Term: Adherence to nutrition and physical activity/exercise program aimed toward attainment of established weight goal;Weight Maintenance: Understanding of the daily nutrition guidelines, which includes 25-35% calories from fat, 7% or less cal from saturated fats, less than 200mg  cholesterol, less  than 1.5gm of sodium, & 5 or more servings of fruits and vegetables daily   Sedentary Yes   Intervention Provide advice, education,  support and counseling about physical activity/exercise needs.;Develop an individualized exercise prescription for aerobic and resistive training based on initial evaluation findings, risk stratification, comorbidities and participant's personal goals.   Expected Outcomes Achievement of increased cardiorespiratory fitness and enhanced flexibility, muscular endurance and strength shown through measurements of functional capacity and personal statement of participant.   Increase Strength and Stamina Yes  Able to climb stairs without SOB   Intervention Provide advice, education, support and counseling about physical activity/exercise needs.;Develop an individualized exercise prescription for aerobic and resistive training based on initial evaluation findings, risk stratification, comorbidities and participant's personal goals.   Expected Outcomes Achievement of increased cardiorespiratory fitness and enhanced flexibility, muscular endurance and strength shown through measurements of functional capacity and personal statement of participant.   Diabetes Yes   Intervention Provide education about signs/symptoms and action to take for hypo/hyperglycemia.;Provide education about proper nutrition, including hydration, and aerobic/resistive exercise prescription along with prescribed medications to achieve blood glucose in normal ranges: Fasting glucose 65-99 mg/dL   Expected Outcomes Short Term: Participant verbalizes understanding of the signs/symptoms and immediate care of hyper/hypoglycemia, proper foot care and importance of medication, aerobic/resistive exercise and nutrition plan for blood glucose control.;Long Term: Attainment of HbA1C < 7%.   Heart Failure Yes   Intervention Provide a combined exercise and nutrition program that is supplemented with education, support and counseling about heart failure. Directed toward relieving symptoms such as shortness of breath, decreased exercise tolerance, and  extremity edema.   Expected Outcomes Improve functional capacity of life;Short term: Attendance in program 2-3 days a week with increased exercise capacity. Reported lower sodium intake. Reported increased fruit and vegetable intake. Reports medication compliance.;Short term: Daily weights obtained and reported for increase. Utilizing diuretic protocols set by physician.;Long term: Adoption of self-care skills and reduction of barriers for early signs and symptoms recognition and intervention leading to self-care maintenance.   Hypertension Yes   Intervention Provide education on lifestyle modifcations including regular physical activity/exercise, weight management, moderate sodium restriction and increased consumption of fresh fruit, vegetables, and low fat dairy, alcohol moderation, and smoking cessation.;Monitor prescription use compliance.   Expected Outcomes Short Term: Continued assessment and intervention until BP is < 140/79mm HG in hypertensive participants. < 130/72mm HG in hypertensive participants with diabetes, heart failure or chronic kidney disease.;Long Term: Maintenance of blood pressure at goal levels.   Lipids Yes   Intervention Provide education and support for participant on nutrition & aerobic/resistive exercise along with prescribed medications to achieve LDL 70mg , HDL >40mg .   Expected Outcomes Short Term: Participant states understanding of desired cholesterol values and is compliant with medications prescribed. Participant is following exercise prescription and nutrition guidelines.;Long Term: Cholesterol controlled with medications as prescribed, with individualized exercise RX and with personalized nutrition plan. Value goals: LDL < 70mg , HDL > 40 mg.       Personal Goals Discharge:     Goals and Risk Factor Review    Row Name 09/28/16 1017 10/26/16 0942 11/21/16 1008 11/23/16 1101       Core Components/Risk Factors/Patient Goals Review   Personal Goals Review Weight  Management/Obesity;Sedentary;Increase Strength and Stamina;Improve shortness of breath with ADL's;Hypertension;Diabetes;Lipids;Heart Failure Weight Management/Obesity;Increase Strength and Stamina;Diabetes;Hypertension;Lipids Increase knowledge of respiratory medications and ability to use respiratory devices properly. Weight Management/Obesity;Sedentary;Increase Strength and Stamina;Hypertension;Lipids    Review Tommy Sullivan is off to a good start  in rehab.  He is already feeling stronger with increased stamina. Now he can go up the stairs without a break 50% of the time!  He is exercising at home on his bike and elliptical on his off days.  He is currently doing 20 min of exercise, we talked about bumping this up to 30 min.  His balance is still off and he hopes to improve as he continues to get stronger.  He is doing well with his diabetes and heart healthy diet.  His weight fluctuates based off when he takes his dieuretic.  His right leg is still swollen and tight, especialy when compared to left leg.  He is wearing a compression stocking and the MD is aware.  He has no other HF symptoms currently.  He has had no problems with his medications. Tommy Sullivan continues to do well with his exercise and he states that he is noticing increased stamina since satrting the program. Blood sugar is controlled. HAs had med change and is still tweaking the dose to prevent blood sugar from dropping to low. Bllod pressure remains in good range. HAs been below [id number] systolic at times. No major symptoms reported with the lower BP readings. Reviewed with Legrand Como to let MD know if he has symptoms with the lower BP readings: dizziness, off balance . He voiced iunderstanding of need to inform MD. A1C and other labs due to be drawn next week.   Ejection Fraction has risen to between 40 and 45%.  Mr Tiegs was started on Symbicort. I gave him a spacer with instruction to use with the Symbicort. Tommy Sullivan's progress in rehab has been limited by his  breathing and his changes in fluid, especially in the legs.  He is not able to walk up stairs or for extended periods of time at a quick pace like before his surgery. He is scheduled to meet with Dr. Haroldine Laws next week for re-evaluaton of his heart and lungs.  Today his weight was 220lbs.  His blood pressures have all been good.  He does seem to still be overloaded with fluid with a tight abdomen and right leg.    Expected Outcomes Short Term: Tommy Sullivan will increase his exercise time at home to 30 min.  Long Term:  Tommy Sullivan will continue to come to class to work on his risk factors. Short Term: Tommy Sullivan will increase his exercise time at home to 30 min.  Long Term:  Tommy Sullivan will continue to come to class to work on his risk factors. Tommy Sullivan will continue to work with his medical team to pursue the right leg swelling and intermittent pain in right hip when walking.   - Short: Tommy Sullivan will continue to exercise and possible look at continuin rehab in pulmonary or [facility].  Long: Tommy Sullivan will continue to work on his risk factors and with medical team for improved SOB/swelling.       Nutrition & Weight - Outcomes:     Pre Biometrics - 08/28/16 1411      Pre Biometrics   Height 5' 10.25" (1.784 m)   Weight 223 lb (101.2 kg)   Waist Circumference 40.5 inches   Hip Circumference 42.5 inches   Waist to Hip Ratio 0.95 %   BMI (Calculated) 31.8   Single Leg Stand 6.54 seconds         Post Biometrics - 11/28/16 1036       Post  Biometrics   Height 5' 10.25" (1.784 m)   Weight 234 lb (106.1  kg)   Waist Circumference 42.5 inches   Hip Circumference 43.25 inches   Waist to Hip Ratio 0.98 %   BMI (Calculated) 33.4   Single Leg Stand 7.61 seconds      Nutrition:     Nutrition Therapy & Goals - 10/26/16 1152      Nutrition Therapy   Diet Instructed patient and his wife on an 1800 calorie meal plan including dietary guidelines for heart healthy eating as well as for diabetes.   Drug/Food Interactions  Statins/Certain Fruits   Protein (specify units) 8   Fiber 20 grams   Whole Grain Foods 3 servings   Saturated Fats 12 max. grams   Fruits and Vegetables 5 servings/day  with optimal goal of 8 servings daily.   Sodium 1500 grams     Personal Nutrition Goals   Nutrition Goal Monitor food/beverage intake and record.   Personal Goal #2 Continue to read label for saturated fat, trans fat and sodium.   Comments Patient has already made significant positive diet changes. He reports his main challenge regarding diet is striving to keep sodium within guidelines.     Intervention Plan   Intervention Prescribe, educate and counsel regarding individualized specific dietary modifications aiming towards targeted core components such as weight, hypertension, lipid management, diabetes, heart failure and other comorbidities.;Nutrition handout(s) given to patient.   Expected Outcomes Short Term Goal: Understand basic principles of dietary content, such as calories, fat, sodium, cholesterol and nutrients.;Short Term Goal: A plan has been developed with personal nutrition goals set during dietitian appointment.;Long Term Goal: Adherence to prescribed nutrition plan.      Nutrition Discharge:     Nutrition Assessments - 11/14/16 1449      Rate Your Plate Scores   Pre Score 76   Pre Score % 87.4 %   Post Score 82   Post Score % 94.3 %   % Change 6.9 %      Education Questionnaire Score:     Knowledge Questionnaire Score - 11/14/16 1449      Knowledge Questionnaire Score   Pre Score 18/28   Post Score 27/28      Goals reviewed with patient; copy given to patient.

## 2016-11-30 NOTE — Progress Notes (Signed)
Daily Session Note  Patient Details  Name: Tommy Sullivan MRN: [id number] Date of Birth: 1946/03/06 Referring Provider:   Flowsheet Row Cardiac Rehab from 08/28/2016 in [facility] Cardiac and Pulmonary Rehab  Referring Provider  Kathlyn Sacramento MD      Encounter Date: 11/30/2016  Check In:     Session Check In - 11/30/16 0845      Check-In   Location [facility]-Cardiac & Pulmonary Rehab   Staff Present Alberteen Sam, MA, ACSM RCEP, Exercise Physiologist;Patricia Surles RN Vickki Hearing, BA, ACSM CEP, Exercise Physiologist   Supervising physician immediately available to respond to emergencies See telemetry face sheet for immediately available ER MD   Medication changes reported     No   Fall or balance concerns reported    No   Tobacco Cessation No Change   Warm-up and Cool-down Performed on first and last piece of equipment   Resistance Training Performed Yes   VAD Patient? No     Pain Assessment   Currently in Pain? No/denies         History  Smoking Status  . Former Smoker  . Packs/day: 1.00  . Years: 20.00  . Types: Cigarettes  . Quit date: 10/02/1988  Smokeless Tobacco  . Never Used    Comment: quit in the 1990's    Goals Met:  Independence with exercise equipment Exercise tolerated well No report of cardiac concerns or symptoms Strength training completed today  Goals Unmet:  Not Applicable  Comments: Tommy Sullivan graduated today from cardiac rehab with 36 sessions completed.  Details of the patient's exercise prescription and what He needs to do in order to continue the prescription and progress were discussed with patient.  Patient was given a copy of prescription and goals.  Patient verbalized understanding.  Tommy Sullivan plans to continue to exercise by walking at home and using his treadmill at home.    Dr. Emily Filbert is Medical Director for [facility].

## 2016-11-30 NOTE — Progress Notes (Signed)
Cardiac Individual Treatment Plan  Patient Details  Name: Tommy Sullivan MRN: [id number] Date of Birth: 05/02/1946 Referring Provider:   Flowsheet Row Cardiac Rehab from 08/28/2016 in [facility] [place] Cardiac [facility] Pul[facility] Re[facility]  Referring Provider  Kathlyn Sacramento MD      Initial Encounter Date:  Flowsheet Row Cardiac Rehab from 08/28/2016 in [facility] Cardiac and Pulmonary Rehab  Date  08/28/16  Referring Provider  Kathlyn Sacramento MD      Visit Diagnosis: S/P CABG x 5  Patient's Home Medications on Admission:  Current Outpatient Prescriptions:  .  aspirin EC 325 MG EC tablet, Take 1 tablet (325 mg total) by mouth daily., Disp: 30 tablet, Rfl: 0 .  Blood Glucose Monitoring Suppl (ONETOUCH VERIO IQ SYSTEM) w/Device KIT, Check blood sugar twice a day and as directed. Dx E11.9, Disp: 1 kit, Rfl: 0 .  budesonide-formoterol (SYMBICORT) 160-4.5 MCG/ACT inhaler, Inhale 2 puffs into the lungs 2 (two) times daily., Disp: 1 Inhaler, Rfl: 0 .  Cholecalciferol (VITAMIN D3) 1000 units CAPS, Take 1,000 Units by mouth daily. , Disp: , Rfl:  .  fenofibrate 160 MG tablet, Take 1 tablet (160 mg total) by mouth daily., Disp: 90 tablet, Rfl: 3 .  glucose blood (ONETOUCH VERIO) test strip, Check blood sugar twice a day and as directed. Dx E11.9, Disp: 100 each, Rfl: 5 .  Insulin Glargine (LANTUS SOLOSTAR) 100 UNIT/ML Solostar Pen, Inject 14 Units into the skin 2 (two) times daily., Disp: , Rfl:  .  Insulin Pen Needle (BD PEN NEEDLE NANO U/F) 32G X 4 MM MISC, Use to inject lantus twice a day and as instructed. Dx E11.9, Disp: 100 each, Rfl: 6 .  LANTUS SOLOSTAR 100 UNIT/ML Solostar Pen, , Disp: , Rfl:  .  latanoprost (XALATAN) 0.005 % ophthalmic solution, Place 1 drop into the left eye at bedtime. , Disp: , Rfl: 3 .  metoprolol tartrate (LOPRESSOR) 25 MG tablet, Take 12.5 mg by mouth 2 (two) times daily., Disp: , Rfl:  .  modafinil (PROVIGIL) 200 MG tablet, Take 1 tablet (200 mg total) by mouth daily., Disp: 90  tablet, Rfl: 3 .  Multiple Vitamins-Minerals (MULTIVITAMIN MEN PO), Take 1 tablet by mouth daily. , Disp: , Rfl:  .  Omega-3 Fatty Acids (FISH OIL) 1000 MG CAPS, Take 2,000 capsules by mouth 2 (two) times daily. , Disp: , Rfl:  .  ONE TOUCH LANCETS MISC, Check blood sugar twice a day and as directed. Dx E11.9, Disp: 100 each, Rfl: 5 .  potassium chloride 20 MEQ TBCR, Take 20 mEq by mouth daily., Disp: 30 tablet, Rfl: 3 .  rosuvastatin (CRESTOR) 10 MG tablet, Take 10 mg by mouth every night, Disp: 90 tablet, Rfl: 3 .  spironolactone (ALDACTONE) 25 MG tablet, Take 25 mg by mouth daily., Disp: , Rfl:  .  tamsulosin (FLOMAX) 0.4 MG CAPS capsule, Take 0.4 mg by mouth in the every other night (Patient not taking: Reported on 11/09/2016), Disp: 45 capsule, Rfl: 3 .  torsemide (DEMADEX) 20 MG tablet, Take 36m in the AM and 222min the PM, Disp: 120 tablet, Rfl: 2  Past Medical History: Past Medical History:  Diagnosis Date  . Allergy   . Arthritis   . Atrial flutter ([place]   a/ 05/2014 s/p RFCA.  . Marland KitchenAD (coronary artery disease) 10/2007   a. 10[facility]2007 [facility]  b. 06/2016 MV: EF 34%, inf/inflat infarct; c. 07/2016 Cath: LM nl, LAd 90ost/prox, 5055m9d, D2  30, RI small, LCX 164m Om1 40, RCA 50ost, 5 ISR, 966m30d, RPDA  80ost, RPL2 70, EF 45-50%;  d. 07/2016 CABG x 5 (LIMA->LAD, VG->Diag, VG->RI, VG->PDA->RPL).  . Chronic systolic CHF (congestive heart failure) ([facility]   a. 07/2016 Echo: Ef 30-35%.  . Diabetes mellitus without complication ([facility]  . Extrinsic asthma, unspecified   . Glaucoma   . Gout   . Hyperlipidemia   . Hypertensive heart disease   . Ischemic cardiomyopathy    a. 07/2016 Echo: Ef 30-35%, mildly dil LA, mild MR/TR, PASP 24 mmHg.  . Marland Kitchenost-op Afib following CABG (07/2016)    a. 07/2016 - placed on amio post-op.  . Marland Kitchenetinal vascular occlusion, unspecified    Right  . Sleep apnea     Tobacco Use: History  Smoking Status  . Former Smoker  .  Packs/day: 1.00  . Years: 20.00  . Types: Cigarettes  . Quit date: 10/02/1988  Smokeless Tobacco  . Never Used    Comment: quit in the 1990's    Labs: Recent Review Flowsheet Data    Labs for ITP Cardiac and Pulmonary Rehab Latest Ref Rng & Units 07/14/2016 07/15/2016 07/16/2016 07/16/2016 11/01/2016   Cholestrol 0 - 200 mg/dL - - - - 95   LDLCALC 0 - 99 mg/dL - - - - 58   LDLDIRECT mg/dL - - - - -   HDL >39.00 mg/dL - - - - 20.00(L)   Trlycerides 0.0 - 149.0 mg/dL - - - - 84.0   Hemoglobin A1c 4.6 - 6.5 % - - - - 6.5   PHART 7.350 - 7.450 7.391 - - 7.346(L) -   PCO2ART 32.0 - 48.0 mmHg 36.8 - - 46.5 -   HCO3 20.0 - 28.0 mmol/L 22.3 - - 25.4 -   TCO2 0 - 100 mmol/L 23 25 - 27 -   ACIDBASEDEF 0.0 - 2.0 mmol/L 2.0 - - 1.0 -   O2SAT % 94.0 - 51.6 94.0 -       Exercise Target Goals:    Exercise Program Goal: Individual exercise prescription set with THRR, safety & activity barriers. Participant demonstrates ability to understand and report RPE using BORG scale, to self-measure pulse accurately, and to acknowledge the importance of the exercise prescription.  Exercise Prescription Goal: Starting with aerobic activity 30 plus minutes a day, 3 days per week for initial exercise prescription. Provide home exercise prescription and guidelines that participant acknowledges understanding prior to discharge.  Activity Barriers & Risk Stratification:     Activity Barriers & Cardiac Risk Stratification - 08/28/16 1330      Activity Barriers & Cardiac Risk Stratification   Activity Barriers Shortness of Breath;Deconditioning;Muscular Weakness;Joint Problems  Shortness of breat occurs only when climbing stairs.  Has been exercising on the treadmill low speed no elevation and can maintain 30 + minutes without symptoms.; shoulder injury from fall 3 years ago, occasional pain   Cardiac Risk Stratification High      6 Minute Walk:     6 Minute Walk    Row Name 08/28/16 1403 11/23/16  1059       6 Minute Walk   Phase Initial Discharge    Distance 1032 feet 814 feet    Distance % Change  - -21.1 %  decreased 218 ft    Walk Time 5.78 minutes  sat down at 5:47 5.53 minutes    # of Rest Breaks _0 sec and 19 sec    MPH  2.03 1.67    METS 2.53 2.23    RPE 17 15    Perceived Dyspnea  3 3    VO2 Peak 7.47 5.36    Symptoms Yes (comment) Yes (comment)    Comments SOB, tightness/fatigue in thighs SOB, thigh pain on right leg    Resting HR 85 bpm 92 bpm    Resting BP 106/66 112/68    Max Ex. HR 99 bpm 102 bpm    Max Ex. BP 122/64 102/58    2 Minute Post BP 106/54  -       Oxygen Initial Assessment:   Oxygen Re-Evaluation:   Oxygen Discharge (Final Oxygen Re-Evaluation):   Initial Exercise Prescription:     Initial Exercise Prescription - 08/28/16 1400      Date of Initial Exercise RX and Referring Provider   Date 08/28/16   Referring Provider Kathlyn Sacramento MD     Treadmill   MPH 1.5   Grade 0   Minutes 15   METs 2.15     NuStep   Level 1   SPM --  60-80 spm   Minutes 15   METs 2     Recumbant Elliptical   Level 1   RPM 50   Minutes 15   METs 2     Prescription Details   Frequency (times per week) 2   Duration Progress to 45 minutes of aerobic exercise without signs/symptoms of physical distress     Intensity   THRR 40-80% of Max Heartrate 111-137   Ratings of Perceived Exertion 11-15   Perceived Dyspnea 0-4     Progression   Progression Continue to progress workloads to maintain intensity without signs/symptoms of physical distress.     Resistance Training   Training Prescription Yes   Weight 3 lbs   Reps 10-12      Perform Capillary Blood Glucose checks as needed.  Exercise Prescription Changes:     Exercise Prescription Changes    Row Name 08/28/16 1300 09/12/16 1600 09/14/16 1000 09/26/16 1500 10/10/16 1600     Response to Exercise   Blood Pressure (Admit) 106/66 104/60  - 100/66 134/70   Blood Pressure  (Exercise) 122/64 118/64  - 132/70 100/64   Blood Pressure (Exit) 106/54 108/68  - 106/64 130/74   Heart Rate (Admit) 85 bpm 79 bpm  - 82 bpm 105 bpm   Heart Rate (Exercise) 99 bpm 108 bpm  - 135 bpm 118 bpm   Heart Rate (Exit) 75 bpm 75 bpm  - 116 bpm 86 bpm   Oxygen Saturation (Admit) 99 %  -  -  -  -   Oxygen Saturation (Exercise) 94 %  -  -  -  -   Rating of Perceived Exertion (Exercise) _0 Symptoms SOB, tightness/fatigue in legs none none none none   Comments  -  - Home Exercise Guidelines given 09/14/16 Home Exercise Guidelines given 09/14/16 Home Exercise Guidelines given 09/14/16   Duration  - Progress to 45 minutes of aerobic exercise without signs/symptoms of physical distress Progress to 45 minutes of aerobic exercise without signs/symptoms of physical distress Progress to 45 minutes of aerobic exercise without signs/symptoms of physical distress Progress to 45 minutes of aerobic exercise without signs/symptoms of physical distress   Intensity  - THRR unchanged THRR unchanged THRR unchanged THRR unchanged     Progression   Progression  - Continue to progress workloads to maintain intensity without signs/symptoms of physical  distress. Continue to progress workloads to maintain intensity without signs/symptoms of physical distress. Continue to progress workloads to maintain intensity without signs/symptoms of physical distress. Continue to progress workloads to maintain intensity without signs/symptoms of physical distress.   Average METs  - 1.85 1.85 2.1 2.54     Resistance Training   Training Prescription  - Yes Yes Yes Yes   Weight  - 3 lbs 3 lbs 3 lbs 3 lbs   Reps  - 10-12 10-12 10-12 10-12     Interval Training   Interval Training  - No No No No     Treadmill   MPH  - 1.5 1.5 1.8 1.8   Grade  - 0 0 0.5 0.5   Minutes  - _0 METs  - 2.15 2.15 2.5 2.5     NuStep   Level  - _1 Minutes  - _2 METs  - 1.6 1.6 2.3 3.5     Recumbant  Elliptical   Level  - _3 Minutes  - _4 METs  - 1.8 1.8 1.5 1.5     Home Exercise Plan   Plans to continue exercise at  -  - Home  walking, treadmill, elliptical, home gym Home  walking, treadmill, elliptical, home gym Home  walking, treadmill, elliptical, home gym   Frequency  -  - Add 3 additional days to program exercise sessions. Add 3 additional days to program exercise sessions. Add 3 additional days to program exercise sessions.     Exercise Review   Progression -  walk test results Yes Yes Yes Yes   Row Name 10/24/16 1300 11/08/16 1400 11/21/16 1600         Response to Exercise   Blood Pressure (Admit) 106/68 124/62 102/70     Blood Pressure (Exercise) 118/60 106/60 128/70     Blood Pressure (Exit) 92/64 112/60 124/72     Heart Rate (Admit) 74 bpm 91 bpm 80 bpm     Heart Rate (Exercise) 116 bpm 118 bpm 114 bpm     Heart Rate (Exit) 87 bpm 84 bpm 83 bpm     Rating of Perceived Exertion (Exercise) _5 Symptoms none none none     Comments Home Exercise Guidelines given 09/14/16 Home Exercise Guidelines given 09/14/16 Home Exercise Guidelines given 09/14/16     Duration Progress to 45 minutes of aerobic exercise without signs/symptoms of physical distress Progress to 45 minutes of aerobic exercise without signs/symptoms of physical distress Progress to 45 minutes of aerobic exercise without signs/symptoms of physical distress     Intensity THRR unchanged THRR unchanged THRR unchanged       Progression   Progression Continue to progress workloads to maintain intensity without signs/symptoms of physical distress. Continue to progress workloads to maintain intensity without signs/symptoms of physical distress. Continue to progress workloads to maintain intensity without signs/symptoms of physical distress.     Average METs 2.4 2.2 2.03       Resistance Training   Training Prescription Yes Yes Yes     Weight 3 lbs 3 lbs 3 lbs     Reps 10-12 10-12 10-12        Interval Training   Interval Training No No No       Treadmill   MPH 1.8 1.8 1.8     Grade 0.5  0.5 0.5     Minutes _0 METs 2.5 2.5 2.5       NuStep   Level _1 Minutes _2 METs 3.2 2.6 2.2       Recumbant Elliptical   Level _3 Minutes _4 METs 1.4 1.5 1.4       Home Exercise Plan   Plans to continue exercise at Home  walking, treadmill, elliptical, home gym Home  walking, treadmill, elliptical, home gym Home  walking, treadmill, elliptical, home gym     Frequency Add 3 additional days to program exercise sessions. Add 3 additional days to program exercise sessions. Add 3 additional days to program exercise sessions.       Exercise Review   Progression Yes Yes Yes        Exercise Comments:     Exercise Comments    Row Name 08/28/16 1409 09/07/16 0911 09/12/16 1606 09/14/16 1037 09/26/16 1542   Exercise Comments Exercise goal is to get up and down stairs without SOB. First full day of exercise!  Patient was oriented to gym and equipment including functions, settings, policies, and procedures.  Patient's individual exercise prescription and treatment plan were reviewed.  All starting workloads were established based on the results of the 6 minute walk test done at initial orientation visit.  The plan for exercise progression was also introduced and progression will be customized based on patient's performance and goals. Tommy Sullivan is off to a good start with exercise.  His blood sugars have been great!  We will continue to monitor his progression. Reviewed home exercise with pt today.  Pt plans to using treadmill, elliptical and home gym for exercise.  Reviewed THR, pulse, RPE, sign and symptoms, and when to call 911 or MD.  Also discussed weather considerations and indoor options.  Pt voiced understanding. Tommy Sullivan has been doing well in rehab.  Today, we added both speed and incline to his treadmill; he did have to take a break, but more do to  incline than speed.  He wants to be able to walk around his neighborhood, but the hills are his limitation, so we will work on his incline tolerance.  We will continue to monitor his progression.   [place] Name 10/10/16 1610 10/24/16 1354 11/08/16 1451 11/21/16 1623 11/30/16 1030   Exercise Comments Tommy Sullivan continues to do well in rehab.  He is getting better and lasting longer on the treadmill before taking a rest break.  We will continue to monitor his progression. Tommy Sullivan has been doing well in rehab.  He says that he is feeling better and stronger than when he started.  We will continue to monitor his progress. Tommy Sullivan continues to do well in rehab.  His weight was back down today as they have been tweaking his diuretic and getting fluid off his legs and heart.  Hopefully with the fluid off, his exercise tolerance will improve.  We will continue to monitor his progression. Tommy Sullivan is nearing graduation.  He was due for his walk test last week, but we held off due to his recent changes in his medications.  If he is feeling well, we will aim to do his post 6MWT on Thursday and he will graduate next week.  He plans to continue to exercise at home after graduation.  We will continue to monitor his progression.  Tommy Sullivan graduated today from cardiac rehab with 36 sessions completed.  Details of the patient's exercise prescription and what He needs to do in order to continue the prescription and progress were discussed with patient.  Patient was given a copy of prescription and goals.  Patient verbalized understanding.  Tommy Sullivan plans to continue to exercise by walking at home and using his treadmill at home.      Exercise Goals and Review:   Exercise Goals Re-Evaluation :   Discharge Exercise Prescription (Final Exercise Prescription Changes):     Exercise Prescription Changes - 11/21/16 1600      Response to Exercise   Blood Pressure (Admit) 102/70   Blood Pressure (Exercise) 128/70   Blood Pressure (Exit) 124/72    Heart Rate (Admit) 80 bpm   Heart Rate (Exercise) 114 bpm   Heart Rate (Exit) 83 bpm   Rating of Perceived Exertion (Exercise) 13   Symptoms none   Comments Home Exercise Guidelines given 09/14/16   Duration Progress to 45 minutes of aerobic exercise without signs/symptoms of physical distress   Intensity THRR unchanged     Progression   Progression Continue to progress workloads to maintain intensity without signs/symptoms of physical distress.   Average METs 2.03     Resistance Training   Training Prescription Yes   Weight 3 lbs   Reps 10-12     Interval Training   Interval Training No     Treadmill   MPH 1.8   Grade 0.5   Minutes 15   METs 2.5     NuStep   Level 1   Minutes 15   METs 2.2     Recumbant Elliptical   Level 1   Minutes 15   METs 1.4     Home Exercise Plan   Plans to continue exercise at Home  walking, treadmill, elliptical, home gym   Frequency Add 3 additional days to program exercise sessions.     Exercise Review   Progression Yes      Nutrition:  Target Goals: Understanding of nutrition guidelines, daily intake of sodium <1551m, cholesterol <2028m calories 30% from fat and 7% or less from saturated fats, daily to have 5 or more servings of fruits and vegetables.  Biometrics:     Pre Biometrics - 08/28/16 1411      Pre Biometrics   Height 5' 10.25" (1.784 m)   Weight 223 lb (101.2 kg)   Waist Circumference 40.5 inches   Hip Circumference 42.5 inches   Waist to Hip Ratio 0.95 %   BMI (Calculated) 31.8   Single Leg Stand 6.54 seconds         Post Biometrics - 11/28/16 1036       Post  Biometrics   Height 5' 10.25" (1.784 m)   Weight 234 lb (106.1 kg)   Waist Circumference 42.5 inches   Hip Circumference 43.25 inches   Waist to Hip Ratio 0.98 %   BMI (Calculated) 33.4   Single Leg Stand 7.61 seconds      Nutrition Therapy Plan and Nutrition Goals:     Nutrition Therapy & Goals - 10/26/16 1152      Nutrition Therapy    Diet Instructed patient and his wife on an 1800 calorie meal plan including dietary guidelines for heart healthy eating as well as for diabetes.   Drug/Food Interactions Statins/Certain Fruits   Protein (specify units) 8   Fiber 20 grams   Whole Grain Foods 3 servings   Saturated Fats  12 max. grams   Fruits and Vegetables 5 servings/day  with optimal goal of 8 servings daily.   Sodium 1500 grams     Personal Nutrition Goals   Nutrition Goal Monitor food/beverage intake and record.   Personal Goal #2 Continue to read label for saturated fat, trans fat and sodium.   Comments Patient has already made significant positive diet changes. He reports his main challenge regarding diet is striving to keep sodium within guidelines.     Intervention Plan   Intervention Prescribe, educate and counsel regarding individualized specific dietary modifications aiming towards targeted core components such as weight, hypertension, lipid management, diabetes, heart failure and other comorbidities.;Nutrition handout(s) given to patient.   Expected Outcomes Short Term Goal: Understand basic principles of dietary content, such as calories, fat, sodium, cholesterol and nutrients.;Short Term Goal: A plan has been developed with personal nutrition goals set during dietitian appointment.;Long Term Goal: Adherence to prescribed nutrition plan.      Nutrition Discharge: Rate Your Plate Scores:     Nutrition Assessments - 11/14/16 1449      Rate Your Plate Scores   Pre Score 76   Pre Score % 87.4 %   Post Score 82   Post Score % 94.3 %   % Change 6.9 %      Nutrition Goals Re-Evaluation:     Nutrition Goals Re-Evaluation    Row Name 09/28/16 1028 11/23/16 1105           Goals   Nutrition Goal  - Monitor food/beverage intake and record.      Comment  - Keeping food record to correspond with symptoms        Personal Goal #1 Re-Evaluation   Goal Progress Seen  - Yes        Personal Goal #2  Re-Evaluation   Personal Goal #2  - Continue to read label for saturated fat, trans fat and sodium.      Goal Progress Seen  - Yes      Re-Evaluation  - Reading labels more frequently.        Intervention Plan   Intervention Continue to educate, counsel and set short/long term goals regarding individualized specific personal dietary modifications. Continue to educate, counsel and set short/long term goals regarding individualized specific personal dietary modifications.      Comments Scheduled dietician appointment for pt on Jan 1/25 at 1030am.  He has been following a heart healthy diet and really trying to control his sodium intake. Continue to work with medical team on weight and will continue to stay on top of his improved diet.         Nutrition Goals Discharge (Final Nutrition Goals Re-Evaluation):     Nutrition Goals Re-Evaluation - 11/23/16 1105      Goals   Nutrition Goal Monitor food/beverage intake and record.   Comment Keeping food record to correspond with symptoms     Personal Goal #1 Re-Evaluation   Goal Progress Seen Yes     Personal Goal #2 Re-Evaluation   Personal Goal #2 Continue to read label for saturated fat, trans fat and sodium.   Goal Progress Seen Yes   Re-Evaluation Reading labels more frequently.     Intervention Plan   Intervention Continue to educate, counsel and set short/long term goals regarding individualized specific personal dietary modifications.   Comments Continue to work with medical team on weight and will continue to stay on top of his improved diet.  Psychosocial: Target Goals: Acknowledge presence or absence of significant depression and/or stress, maximize coping skills, provide positive support system. Participant is able to verbalize types and ability to use techniques and skills needed for reducing stress and depression.   Initial Review & Psychosocial Screening:     Initial Psych Review & Screening - 08/28/16 1327       Initial Review   Current issues with --  none reported     Family Dynamics   Good Support System? Yes  Wife     Barriers   Psychosocial barriers to participate in program There are no identifiable barriers or psychosocial needs.;The patient should benefit from training in stress management and relaxation.     Screening Interventions   Interventions Encouraged to exercise      Quality of Life Scores:      Quality of Life - 11/14/16 1450      Quality of Life Scores   Health/Function Pre 22.71 %   Health/Function Post 22.96 %   Health/Function % Change 1.1 %   Socioeconomic Pre 28.29 %   Socioeconomic Post 25.92 %   Socioeconomic % Change  -8.38 %   Psych/Spiritual Pre 29.14 %   Psych/Spiritual Post 27.93 %   Psych/Spiritual % Change -4.15 %   Family Pre 27 %   Family Post 27 %   Family % Change 0 %   GLOBAL Pre 25.88 %   GLOBAL Post 25.33 %   GLOBAL % Change -2.13 %      PHQ-9: Recent Review Flowsheet Data    Depression screen [facility] 2/9 11/14/2016 11/01/2016 08/28/2016 06/14/2016 04/27/2016   Decreased Interest 0 0 1 0 0   Down, Depressed, Hopeless 0 0 0 0 0   PHQ - 2 Score 0 0 1 0 0   Altered sleeping 0 - 1 - -   Tired, decreased energy 1 - 1 - -   Change in appetite 0 - 0 - -   Feeling bad or failure about yourself  0 - 0 - -   Trouble concentrating 0 - 0 - -   Moving slowly or fidgety/restless 0 - 0 - -   Suicidal thoughts 0 - 0 - -   PHQ-9 Score 1 - 3 - -   Difficult doing work/chores Somewhat difficult - Not difficult at all - -     Interpretation of Total Score  Total Score Depression Severity:  1-4 = Minimal depression, 5-9 = Mild depression, 10-14 = Moderate depression, 15-19 = Moderately severe depression, 20-27 = Severe depression   Psychosocial Evaluation and Intervention:     Psychosocial Evaluation - 09/12/16 0946      Psychosocial Evaluation & Interventions   Interventions Encouraged to exercise with the program and follow exercise prescription    Comments Counselor met with Tommy Sullivan (Tommy Sullivan) today for initial psychosocial evaluation.  He is a 71 yr old who had triple bypass in October.  He has a spouse of 58 years and some relatives of his spouse who live locally and provide support for him.  Tommy Sullivan reports having multiple health issues with shoulder and knee problems; diabetes; blind in one eye; and sleep apnea in addition to his cardiac issues.  He states his appetite is good and he denies a history or current symptoms of depression or anxiety.  Tommy Sullivan reports he is typically in a positive mood and has minimal stress in his life.  He has goals to be able to walk up  the stairs without shortness of breath.  He has exercise equipment at home that he will begin using once he is released to do so.  Staff will follow with Tommy Sullivan throughout the course of this program.        Psychosocial Re-Evaluation:     Psychosocial Re-Evaluation    Row Name 10/26/16 [phone] 11/23/16 1107           Psychosocial Re-Evaluation   Comments Tommy Sullivan continues without psychosocial issues. Will continue to review with Tommy Sullivan for any changes.  He has good positive outlook on life.  Tommy Sullivan continues to not have any major stressors.  His biggest concern has been his on going health issues with his breathing and fluid build up.  He is working closely with his medical team that he loves.  He continues to stay positive about everything.      Interventions Encouraged to attend Cardiac Rehabilitation for the exercise Encouraged to attend Cardiac Rehabilitation for the exercise      Continue Psychosocial Services  No  -         Psychosocial Discharge (Final Psychosocial Re-Evaluation):     Psychosocial Re-Evaluation - 11/23/16 1107      Psychosocial Re-Evaluation   Comments Tommy Sullivan continues to not have any major stressors.  His biggest concern has been his on going health issues with his breathing and fluid build up.  He is working closely with his medical team that he  loves.  He continues to stay positive about everything.   Interventions Encouraged to attend Cardiac Rehabilitation for the exercise      Vocational Rehabilitation: Provide vocational rehab assistance to qualifying candidates.   Vocational Rehab Evaluation & Intervention:     Vocational Rehab - 08/28/16 1332      Initial Vocational Rehab Evaluation & Intervention   Assessment shows need for Vocational Rehabilitation No      Education: Education Goals: Education classes will be provided on a weekly basis, covering required topics. Participant will state understanding/return demonstration of topics presented.  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 Gives an explanation and review of dietary fats and fiber. Flowsheet Row Cardiac Rehab from 11/30/2016 in [facility] Cardiac and Pulmonary Rehab  Date  10/10/16  Educator  PI  Instruction Review Code  2- meets goals/outcomes      Controlling Sodium/Reading Food Labels: -Group verbal and written material supporting the discussion of sodium use in heart healthy nutrition. Review and explanation with models, verbal and written materials for utilization of the food label. Flowsheet Row Cardiac Rehab from 11/30/2016 in [facility] Cardiac and Pulmonary Rehab  Date  10/17/16  Educator  LB  Instruction Review Code  2- meets goals/outcomes      Exercise Physiology & Risk Factors: - Group verbal and written instruction with models to review the exercise physiology of the cardiovascular system and associated critical values. Details cardiovascular disease risk factors and  the goals associated with each risk factor. Flowsheet Row Cardiac Rehab from 11/30/2016 in [facility] Cardiac and Pulmonary Rehab  Date  10/24/16  Educator  [facility]  Instruction Review Code  2- meets goals/outcomes      Aerobic Exercise &  Resistance Training: - Gives group verbal and written discussion on the health impact of inactivity. On the components of aerobic and resistive training programs and the benefits of this training and how to safely progress through these programs. Flowsheet Row Cardiac Rehab from 11/30/2016 in [facility] Cardiac and Pulmonary Rehab  Date  10/26/16  Educator  AS  Instruction Review Code  2- meets goals/outcomes      Flexibility, Balance, General Exercise Guidelines: - Provides group verbal and written instruction on the benefits of flexibility and balance training programs. Provides general exercise guidelines with specific guidelines to those with heart or lung disease. Demonstration and skill practice provided.   Stress Management: - Provides group verbal and written instruction about the health risks of elevated stress, cause of high stress, and healthy ways to reduce stress. Flowsheet Row Cardiac Rehab from 11/30/2016 in [facility] Cardiac and Pulmonary Rehab  Date  11/09/16  Educator  TS  Instruction Review Code  2- meets goals/outcomes      Depression: - Provides group verbal and written instruction on the correlation between heart/lung disease and depressed mood, treatment options, and the stigmas associated with seeking treatment. Flowsheet Row Cardiac Rehab from 11/30/2016 in [facility] Cardiac and Pulmonary Rehab  Date  10/12/16  Educator  [facility]  Instruction Review Code  2- meets goals/outcomes      Anatomy & Physiology of the Heart: - Group verbal and written instruction and models provide basic cardiac anatomy and physiology, with the coronary electrical and arterial systems. Review of: AMI, Angina, Valve disease, Heart Failure, Cardiac Arrhythmia, Pacemakers, and the  ICD. Flowsheet Row Cardiac Rehab from 11/30/2016 in [facility] Cardiac and Pulmonary Rehab  Date  11/07/16  Educator  CE  Instruction Review Code  2- meets goals/outcomes      Cardiac Procedures: - Group verbal and written instruction and models to describe the testing methods done to diagnose heart disease. Reviews the outcomes of the test results. Describes the treatment choices: Medical Management, Angioplasty, or Coronary Bypass Surgery. Flowsheet Row Cardiac Rehab from 11/30/2016 in [facility] Cardiac and Pulmonary Rehab  Date  11/14/16  Educator  CE  Instruction Review Code  2- meets goals/outcomes      Cardiac Medications: - Group verbal and written instruction to review commonly prescribed medications for heart disease. Reviews the medication, class of the drug, and side effects. Includes the steps to properly store meds and maintain the prescription regimen. Flowsheet Row Cardiac Rehab from 11/30/2016 in [facility] Cardiac and Pulmonary Rehab  Date  11/30/16 [part 2]  Educator  AS  Instruction Review Code  2- meets goals/outcomes [part 2]      Go Sex-Intimacy & Heart Disease, Get SMART - Goal Setting: - Group verbal and written instruction through game format to discuss heart disease and the return to sexual intimacy. Provides group verbal and written material to discuss and apply goal setting through the application of the S.M.A.R.T. Method. Flowsheet Row Cardiac Rehab from 11/30/2016 in [facility] Cardiac and Pulmonary Rehab  Date  11/14/16  Educator  CE  Instruction Review Code  2- meets goals/outcomes      Other Matters of the Heart: - Provides group verbal, written materials and models to describe Heart Failure, Angina, Valve Disease, and Diabetes in the realm of heart disease. Includes description of the disease process and treatment options available to the cardiac patient. Flowsheet Row Cardiac Rehab from 11/30/2016 in [facility] Cardiac and Pulmonary Rehab  Date  11/07/16  Educator  CE  Instruction  Review Code  2- meets goals/outcomes      Exercise & Equipment Safety: - Individual verbal instruction and demonstration of equipment use and safety with use of the equipment. Flowsheet Row Cardiac Rehab from 11/30/2016 in [facility] Cardiac and Pulmonary Rehab  Date  08/28/16  Educator  SB  Instruction Review Code  2- meets goals/outcomes      Infection Prevention: - Provides verbal and written material to individual with discussion of infection control including proper hand washing and proper equipment cleaning during exercise session. Flowsheet Row Cardiac Rehab from 11/30/2016 in [facility] Cardiac and Pulmonary Rehab  Date  08/28/16  Educator  SB  Instruction Review Code  2- meets goals/outcomes      Falls Prevention: - Provides verbal and written material to individual with discussion of falls prevention and safety. Flowsheet Row Cardiac Rehab from 11/30/2016 in [facility] Cardiac and Pulmonary Rehab  Date  08/28/16  Educator  SB  Instruction Review Code  2- meets goals/outcomes      Diabetes: - Individual verbal and written instruction to review signs/symptoms of diabetes, desired ranges of glucose level fasting, after meals and with exercise. Advice that pre and post exercise glucose checks will be done for 3 sessions at entry of program. Flowsheet Row Cardiac Rehab from 11/30/2016 in [facility] Cardiac and Pulmonary Rehab  Date  08/28/16  Educator  SB  Instruction Review Code  2- meets goals/outcomes       Knowledge Questionnaire Score:     Knowledge Questionnaire Score - 11/14/16 1449      Knowledge Questionnaire Score   Pre Score 18/28   Post Score 27/28      Core Components/Risk Factors/Patient Goals at Admission:     Personal Goals and Risk Factors at Admission - 08/28/16 1320      Core Components/Risk Factors/Patient Goals on Admission    Weight Management Yes;Obesity   Intervention Weight Management: Develop a combined nutrition and exercise program designed to reach desired  caloric intake, while maintaining appropriate intake of nutrient and fiber, sodium and fats, and appropriate energy expenditure required for the weight goal.   Admit Weight 223 lb (101.2 kg)   Goal Weight: Short Term 218 lb (98.9 kg)   Goal Weight: Long Term 200 lb (90.7 kg)   Expected Outcomes Short Term: Continue to assess and modify interventions until short term weight is achieved;Long Term: Adherence to nutrition and physical activity/exercise program aimed toward attainment of established weight goal;Weight Maintenance: Understanding of the daily nutrition guidelines, which includes 25-35% calories from fat, 7% or less cal from saturated fats, less than 229m cholesterol, less than 1.5gm of sodium, & 5 or more servings of fruits and vegetables daily   Sedentary Yes   Intervention Provide advice, education, support and counseling about physical activity/exercise needs.;Develop an individualized exercise prescription for aerobic and resistive training based on initial evaluation findings, risk stratification, comorbidities and participant's personal goals.   Expected Outcomes Achievement of increased cardiorespiratory fitness and enhanced flexibility, muscular endurance and strength shown through measurements of functional capacity and personal statement of participant.   Increase Strength and Stamina Yes  Able to climb stairs without SOB   Intervention Provide advice, education, support and counseling about physical activity/exercise needs.;Develop an individualized exercise prescription for aerobic and resistive training based on initial evaluation findings, risk stratification, comorbidities and participant's personal goals.   Expected Outcomes Achievement of increased cardiorespiratory fitness and enhanced flexibility, muscular endurance and strength shown through measurements of functional capacity and personal statement of participant.   Diabetes  Yes   Intervention Provide education about  signs/symptoms and action to take for hypo/hyperglycemia.;Provide education about proper nutrition, including hydration, and aerobic/resistive exercise prescription along with prescribed medications to achieve blood glucose in normal ranges: Fasting glucose 65-99 mg/dL   Expected Outcomes Short Term: Participant verbalizes understanding of the signs/symptoms and immediate care of hyper/hypoglycemia, proper foot care and importance of medication, aerobic/resistive exercise and nutrition plan for blood glucose control.;Long Term: Attainment of HbA1C < 7%.   Heart Failure Yes   Intervention Provide a combined exercise and nutrition program that is supplemented with education, support and counseling about heart failure. Directed toward relieving symptoms such as shortness of breath, decreased exercise tolerance, and extremity edema.   Expected Outcomes Improve functional capacity of life;Short term: Attendance in program 2-3 days a week with increased exercise capacity. Reported lower sodium intake. Reported increased fruit and vegetable intake. Reports medication compliance.;Short term: Daily weights obtained and reported for increase. Utilizing diuretic protocols set by physician.;Long term: Adoption of self-care skills and reduction of barriers for early signs and symptoms recognition and intervention leading to self-care maintenance.   Hypertension Yes   Intervention Provide education on lifestyle modifcations including regular physical activity/exercise, weight management, moderate sodium restriction and increased consumption of fresh fruit, vegetables, and low fat dairy, alcohol moderation, and smoking cessation.;Monitor prescription use compliance.   Expected Outcomes Short Term: Continued assessment and intervention until BP is < 140/30m HG in hypertensive participants. < 130/81mHG in hypertensive participants with diabetes, heart failure or chronic kidney disease.;Long Term: Maintenance of blood  pressure at goal levels.   Lipids Yes   Intervention Provide education and support for participant on nutrition & aerobic/resistive exercise along with prescribed medications to achieve LDL <705mHDL >57m61m Expected Outcomes Short Term: Participant states understanding of desired cholesterol values and is compliant with medications prescribed. Participant is following exercise prescription and nutrition guidelines.;Long Term: Cholesterol controlled with medications as prescribed, with individualized exercise RX and with personalized nutrition plan. Value goals: LDL < 70mg49mL > 40 mg.      Core Components/Risk Factors/Patient Goals Review:      Goals and Risk Factor Review    Row Name 09/28/16 1017 10/26/16 0942 11/21/16 1008 11/23/16 1101       Core Components/Risk Factors/Patient Goals Review   Personal Goals Review Weight Management/Obesity;Sedentary;Increase Strength and Stamina;Improve shortness of breath with ADL's;Hypertension;Diabetes;Lipids;Heart Failure Weight Management/Obesity;Increase Strength and Stamina;Diabetes;Hypertension;Lipids Increase knowledge of respiratory medications and ability to use respiratory devices properly. Weight Management/Obesity;Sedentary;Increase Strength and Stamina;Hypertension;Lipids    Review Tommy Sullivan Tommy Beltsff to a good start in rehab.  He is already feeling stronger with increased stamina. Now he can go up the stairs without a break 50% of the time!  He is exercising at home on his bike and elliptical on his off days.  He is currently doing 20 min of exercise, we talked about bumping this up to 30 min.  His balance is still off and he hopes to improve as he continues to get stronger.  He is doing well with his diabetes and heart healthy diet.  His weight fluctuates based off when he takes his dieuretic.  His right leg is still swollen and tight, especialy when compared to left leg.  He is wearing a compression stocking and the MD is aware.  He has no other HF  symptoms currently.  He has had no problems with his medications. Tommy Sullivan continues to do well with his exercise and he states that he  is noticing increased stamina since satrting the program. Blood sugar is controlled. HAs had med change and is still tweaking the dose to prevent blood sugar from dropping to low. Bllod pressure remains in good range. HAs been below 841 systolic at times. No major symptoms reported with the lower BP readings. Reviewed with Tommy Sullivan to let MD know if he has symptoms with the lower BP readings: dizziness, off balance . He voiced iunderstanding of need to inform MD. A1C and other labs due to be drawn next week.   Ejection Fraction has risen to between 40 and 45%.  Mr Larouche was started on Symbicort. I gave him a spacer with instruction to use with the Symbicort. Tommy Sullivan's progress in rehab has been limited by his breathing and his changes in fluid, especially in the legs.  He is not able to walk up stairs or for extended periods of time at a quick pace like before his surgery. He is scheduled to meet with Dr. Haroldine Laws next week for re-evaluaton of his heart and lungs.  Today his weight was 220lbs.  His blood pressures have all been good.  He does seem to still be overloaded with fluid with a tight abdomen and right leg.    Expected Outcomes Short Term: Tommy Sullivan will increase his exercise time at home to 30 min.  Long Term:  Tommy Sullivan will continue to come to class to work on his risk factors. Short Term: Tommy Sullivan will increase his exercise time at home to 30 min.  Long Term:  Tommy Sullivan will continue to come to class to work on his risk factors. Tommy Sullivan will continue to work with his medical team to pursue the right leg swelling and intermittent pain in right hip when walking.   - Short: Tommy Sullivan will continue to exercise and possible look at continuin rehab in pulmonary or [facility].  Long: Tommy Sullivan will continue to work on his risk factors and with medical team for improved SOB/swelling.       Core  Components/Risk Factors/Patient Goals at Discharge (Final Review):      Goals and Risk Factor Review - 11/23/16 1101      Core Components/Risk Factors/Patient Goals Review   Personal Goals Review Weight Management/Obesity;Sedentary;Increase Strength and Stamina;Hypertension;Lipids   Review Tommy Sullivan's progress in rehab has been limited by his breathing and his changes in fluid, especially in the legs.  He is not able to walk up stairs or for extended periods of time at a quick pace like before his surgery. He is scheduled to meet with Dr. Haroldine Laws next week for re-evaluaton of his heart and lungs.  Today his weight was 220lbs.  His blood pressures have all been good.  He does seem to still be overloaded with fluid with a tight abdomen and right leg.   Expected Outcomes Short: Tommy Sullivan will continue to exercise and possible look at continuin rehab in [facility] or [facility].  Long: Tommy Sullivan will continue to work on his risk factors and with medical team for improved SOB/swelling.      ITP Comments:     ITP Comments    Row Name 08/28/16 1313 09/06/16 0600 10/03/16 0622 11/01/16 0618 11/29/16 0559   ITP Comments Med review completed. Initial ITP created. Diagnosis documentation can be found in [facility] Encounter 07/10/2016 30 day review completed for review by Dr Emily Filbert.  Continue with ITP unless changes noted by Dr Sabra Heck. 30 day review. Continue with ITP unless directed changes per Medical Director review. 30 day review. Continue  with ITP unless directed changes per Medical Director review. 30 day review. Continue with ITP unless directed changes per Medical Director review      Comments: Discharge ITP

## 2016-12-06 ENCOUNTER — Ambulatory Visit (HOSPITAL_COMMUNITY)
Admission: RE | Admit: 2016-12-06 | Discharge: 2016-12-06 | Disposition: A | Discharge status: Home / Self Care | Payer: PPO | Source: Ambulatory Visit | Attending: Cardiology | Admitting: Cardiology

## 2016-12-06 DIAGNOSIS — I5021 Acute systolic (congestive) heart failure: Secondary | ICD-10-CM | POA: Insufficient documentation

## 2016-12-06 LAB — BASIC METABOLIC PANEL
ANION GAP: 11 (ref 5–15)
BUN: 28 mg/dL — ABNORMAL HIGH (ref 6–20)
CALCIUM: 9.8 mg/dL (ref 8.9–10.3)
CHLORIDE: 95 mmol/L — AB (ref 101–111)
CO2: 27 mmol/L (ref 22–32)
CREATININE: 1.58 mg/dL — AB (ref 0.61–1.24)
GFR calc non Af Amer: 43 mL/min — ABNORMAL LOW (ref >60)
GFR, EST AFRICAN AMERICAN: 49 mL/min — AB (ref >60)
Glucose, Bld: 87 mg/dL (ref 65–99)
Potassium: 4.1 mmol/L (ref 3.5–5.1)
SODIUM: 133 mmol/L — AB (ref 135–145)

## 2016-12-07 ENCOUNTER — Encounter: Payer: Self-pay | Admitting: *Deleted

## 2016-12-07 ENCOUNTER — Other Ambulatory Visit: Payer: Self-pay | Admitting: *Deleted

## 2016-12-07 DIAGNOSIS — G4733 Obstructive sleep apnea (adult) (pediatric): Secondary | ICD-10-CM | POA: Diagnosis not present

## 2016-12-07 NOTE — Patient Outreach (Signed)
[facility]) Care Management  12/07/2016  Tommy Sullivan [birth year] [phone]  RN Health Coach telephone call to patient.  [employer] compliance verified.Per patient he feels that he has plenty of educational material on Diabetes. He stated he has been to nutritional classes and diabetes classes twice. .  Patient stated that he his wife are both diabetics and they watch their diet. He eats 3 meals a day. His blood sugar was a little low 59 and he dranked some orange juice. His insulin has been decreased. Patient also has Congestive Heart failure. Per patient he just finished rehab and he has lots of information to read and work on that. He didn't feel he needed additional education at this time. Plan:  Consumer assessed and no further intervention needed  [facility] [phone]

## 2016-12-08 ENCOUNTER — Encounter: Payer: Self-pay | Admitting: *Deleted

## 2016-12-13 ENCOUNTER — Telehealth: Payer: Self-pay

## 2016-12-13 ENCOUNTER — Ambulatory Visit (HOSPITAL_COMMUNITY)
Admission: RE | Admit: 2016-12-13 | Discharge: 2016-12-13 | Disposition: A | Discharge status: Home / Self Care | Payer: PPO | Source: Ambulatory Visit | Attending: Internal Medicine | Admitting: Internal Medicine

## 2016-12-13 VITALS — BP 88/64 | HR 86 | Wt 216.4 lb

## 2016-12-13 DIAGNOSIS — Z79899 Other long term (current) drug therapy: Secondary | ICD-10-CM | POA: Insufficient documentation

## 2016-12-13 DIAGNOSIS — I5022 Chronic systolic (congestive) heart failure: Secondary | ICD-10-CM | POA: Diagnosis not present

## 2016-12-13 DIAGNOSIS — I4891 Unspecified atrial fibrillation: Secondary | ICD-10-CM | POA: Diagnosis not present

## 2016-12-13 DIAGNOSIS — I10 Essential (primary) hypertension: Secondary | ICD-10-CM | POA: Diagnosis not present

## 2016-12-13 DIAGNOSIS — H409 Unspecified glaucoma: Secondary | ICD-10-CM | POA: Diagnosis not present

## 2016-12-13 DIAGNOSIS — I4892 Unspecified atrial flutter: Secondary | ICD-10-CM | POA: Diagnosis not present

## 2016-12-13 DIAGNOSIS — Z951 Presence of aortocoronary bypass graft: Secondary | ICD-10-CM | POA: Insufficient documentation

## 2016-12-13 DIAGNOSIS — Z7982 Long term (current) use of aspirin: Secondary | ICD-10-CM | POA: Diagnosis not present

## 2016-12-13 DIAGNOSIS — I119 Hypertensive heart disease without heart failure: Secondary | ICD-10-CM | POA: Insufficient documentation

## 2016-12-13 DIAGNOSIS — Z833 Family history of diabetes mellitus: Secondary | ICD-10-CM | POA: Insufficient documentation

## 2016-12-13 DIAGNOSIS — Z9889 Other specified postprocedural states: Secondary | ICD-10-CM | POA: Diagnosis not present

## 2016-12-13 DIAGNOSIS — H93229 Diplacusis, unspecified ear: Secondary | ICD-10-CM

## 2016-12-13 DIAGNOSIS — E785 Hyperlipidemia, unspecified: Secondary | ICD-10-CM | POA: Diagnosis not present

## 2016-12-13 DIAGNOSIS — G4733 Obstructive sleep apnea (adult) (pediatric): Secondary | ICD-10-CM

## 2016-12-13 DIAGNOSIS — M109 Gout, unspecified: Secondary | ICD-10-CM | POA: Insufficient documentation

## 2016-12-13 DIAGNOSIS — I255 Ischemic cardiomyopathy: Secondary | ICD-10-CM | POA: Insufficient documentation

## 2016-12-13 DIAGNOSIS — J45909 Unspecified asthma, uncomplicated: Secondary | ICD-10-CM | POA: Diagnosis not present

## 2016-12-13 DIAGNOSIS — Z8249 Family history of ischemic heart disease and other diseases of the circulatory system: Secondary | ICD-10-CM | POA: Diagnosis not present

## 2016-12-13 DIAGNOSIS — Z794 Long term (current) use of insulin: Secondary | ICD-10-CM | POA: Diagnosis not present

## 2016-12-13 DIAGNOSIS — E119 Type 2 diabetes mellitus without complications: Secondary | ICD-10-CM | POA: Insufficient documentation

## 2016-12-13 DIAGNOSIS — I2510 Atherosclerotic heart disease of native coronary artery without angina pectoris: Secondary | ICD-10-CM | POA: Insufficient documentation

## 2016-12-13 DIAGNOSIS — I252 Old myocardial infarction: Secondary | ICD-10-CM | POA: Diagnosis not present

## 2016-12-13 DIAGNOSIS — F17210 Nicotine dependence, cigarettes, uncomplicated: Secondary | ICD-10-CM | POA: Insufficient documentation

## 2016-12-13 LAB — BASIC METABOLIC PANEL
ANION GAP: 9 (ref 5–15)
BUN: 32 mg/dL — ABNORMAL HIGH (ref 6–20)
CO2: 28 mmol/L (ref 22–32)
Calcium: 9.5 mg/dL (ref 8.9–10.3)
Chloride: 97 mmol/L — ABNORMAL LOW (ref 101–111)
Creatinine, Ser: 1.58 mg/dL — ABNORMAL HIGH (ref 0.61–1.24)
GFR calc Af Amer: 49 mL/min — ABNORMAL LOW (ref >60)
GFR, EST NON AFRICAN AMERICAN: 43 mL/min — AB (ref >60)
Glucose, Bld: 142 mg/dL — ABNORMAL HIGH (ref 65–99)
POTASSIUM: 4.2 mmol/L (ref 3.5–5.1)
SODIUM: 134 mmol/L — AB (ref 135–145)

## 2016-12-13 LAB — BRAIN NATRIURETIC PEPTIDE: B NATRIURETIC PEPTIDE 5: 1497.5 pg/mL — AB (ref 0.0–100.0)

## 2016-12-13 MED ORDER — SPIRONOLACTONE 25 MG PO TABS: 25.0000 mg | ORAL_TABLET | Freq: Every day | ORAL | 3 refills | Status: DC

## 2016-12-13 NOTE — Progress Notes (Signed)
Advanced Heart Failure Medication Review by a Pharmacist  Does the patient  feel that his/her medications are working for him/her?  yes  Has the patient been experiencing any side effects to the medications prescribed?  no  Does the patient measure his/her own blood pressure or blood glucose at home?  yes   Does the patient have any problems obtaining medications due to transportation or finances?   no  Understanding of regimen: good Understanding of indications: good Potential of compliance: good Patient understands to avoid NSAIDs. Patient understands to avoid decongestants.  Issues to address at subsequent visits: None  Pharmacist comments: Tommy Sullivan is a 71 yo WM presenting to advanced HF clinic with his wife. He endorses compliance with his medications. He is down significantly in weight since starting torsemide. Now at 10 mg bid. No longer requiring Lantus due to low BG. No other questions or concerns.   Belia Heman, PharmD PGY1 Pharmacy Resident [phone] (Pager) 12/13/2016 10:30 AM  Time with patient: 10 min Preparation and documentation time: 2 min Total time: 12 min

## 2016-12-13 NOTE — Progress Notes (Signed)
ADVANCED HEART FAILURE CLINIC CONSULT NOTE   Date:  12/13/2016   ID:  [name], DOB 26-Jun-1946, MRN [id number]  PCP:  Tommy Lofts, MD  Cardiologist:   Tommy Sullivan HF MD: Tommy Sullivan  Pulmonary; Tommy Sullivan    History of Present Illness: Tommy Sullivan is a 71 y.o. male with a h/o CAD s/p CABG, moderate COPD, DM2, OSA on CPAP, HTN, chronic systolic heart failure (most recent EF 40-45%) and atrial flutter status post ablation who is referred by Tommy. Fletcher Sullivan for evaluation in the HF Clinic.   He had amyocardial infarction  in 2009 with PCI done by Tommy Sullivan. He underwent atrial flutter ablation in August 2015 by Tommy Sullivan.  He was seen in October for significant worsening of exertional dyspnea. He underwent a stress test which showed evidence of prior inferior infarct with a decrease in ejection fraction. He underwent cardiac catheterization which showed severe three-vessel coronary artery disease with an ejection fraction of 45%. The patient underwent CABG by Tommy. Prescott Sullivan in 10/17. He had postoperative atrial fibrillation which was successfully treated with amiodarone. The patient developed volume overload that required continuation of torsemide.   He had an echocardiogram done in October after CABG which showed a drop in LV systolic function with an EF of 30-35%. He had a repeat echocardiogram 1/18 which showed an EF of 40-45%, moderate mitral regurgitation, moderate to severe tricuspid regurgitation with peak systolic pressure of 41 mmHg. He was started on spironolactone in addition to torsemide and volume status improved.   Today he returns for HF follow up.  Last visit volume status elevated so torsemide was increased 40/20 mg and kcl 20 meq added. Weight at home has gone down from 227 to to 209.5 pounds. Overall feeling better. Able to do more at home. Able to walk in the clinic. Remains SOB with exertion.  Not able to use CPAP.  Able to walk up steps with breaks .Rides the bike every  other day 30-40 minutes. Completed cardiac rehab. Appetite good. Having difficulty with hypoglycemia. Taking medications.   ECHO 10/2016 EF 40-45%. Peak PA pressure 41 mm hgm    PFTs FEV1 1.85 (58%) FVC  3.01 (69%) DLCO 79%  Past Medical History:  Diagnosis Date  . Allergy   . Arthritis   . Atrial flutter ([place])    a/ 05/2014 s/p RFCA.  Marland Kitchen CAD (coronary artery disease) 10/2007   a. 10/2007 s/p MI/PCI of RCA [facility] - Tommy Sullivan);  b. 06/2016 MV: EF 34%, inf/inflat infarct; c. 07/2016 Cath: LM nl, LAd 90ost/prox, 63m 99d, D2 30, RI small, LCX 1021mOm1 40, RCA 50ost, 5 ISR, 9034m0d, RPDA  80ost, RPL2 70, EF 45-50%;  d. 07/2016 CABG x 5 (LIMA->LAD, VG->Diag, VG->RI, VG->PDA->RPL).  . Chronic systolic CHF (congestive heart failure) [place]  a. 07/2016 Echo: Ef 30-35%.  . [place] . Extrinsic asthma, unspecified   . Glaucoma   . Gout   . Hyperlipidemia   . Hypertensive heart disease   . Ischemic cardiomyopathy    a. 07/2016 Echo: Ef 30-35%, mildly dil LA, mild MR/TR, PASP 24 mmHg.  . 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 07/2016 - placed on amio post-op.  . RMarland Kitchentinal vascular occlusion, unspecified    Right  . Sleep apnea     Past Surgical History:  Procedure Laterality Date  . ABLATION  05-07-2014   EPS and ablation of atrial flutter by  Tommy Sullivan  . APPENDECTOMY  1954  . ATRIAL FLUTTER ABLATION N/A 05/07/2014   Procedure: ATRIAL FLUTTER ABLATION;  Surgeon: Tommy Sprang, MD;  Location: [facility] CATH LAB;  Service: Cardiovascular;  Laterality: N/A;  . CARDIAC CATHETERIZATION Left 07/10/2016   Procedure: Left Heart Cath and Coronary Angiography;  Surgeon: Tommy Hampshire, MD;  Location: [place] CV LAB;  Service: Cardiovascular;  Laterality: Left;  . COLONOSCOPY    . CORONARY ANGIOPLASTY  2009   stent Normal  . CORONARY ARTERY BYPASS GRAFT N/A 07/14/2016   Procedure: CORONARY ARTERY BYPASS GRAFTING (CABG), ON PUMP, TIMES FIVE, USING  LEFT INTERNAL MAMMARY ARTERY, RIGHT GREATER SAPHENOUS VEIN HARVESTED ENDOSCOPICALLY;  Surgeon: Tommy Poot, MD;  Location: [place];  Service: Open Heart Surgery;  Laterality: N/A;  SVG to PD and PL sequential SVG to 1st diagonal SVG to Ramus LIMA to LAD  . NECK SURGERY  1990's   x2, Fusion  . SHOULDER ARTHROSCOPY WITH SUBACROMIAL DECOMPRESSION Left 04/02/2014   Procedure: SHOULDER ARTHROSCOPY WITH SUBACROMIAL DECOMPRESSION WITH ROTATOR CUFF REPAIR;  Surgeon: Tommy Sells, MD;  Location: [place];  Service: Orthopedics;  Laterality: Left;  Left shoulder rotator cuff repair, subacromial decompression  . SKIN SURGERY    . TEE WITHOUT CARDIOVERSION N/A 07/14/2016   Procedure: TRANSESOPHAGEAL ECHOCARDIOGRAM (TEE);  Surgeon: Tommy Poot, MD;  Location: [place];  Service: Open Heart Surgery;  Laterality: N/A;  . TONSILLECTOMY       Current Outpatient Prescriptions  Medication Sig Dispense Refill  . aspirin EC 325 MG EC tablet Take 1 tablet (325 mg total) by mouth daily. 30 tablet 0  . Blood Glucose Monitoring Suppl (ONETOUCH VERIO IQ SYSTEM) w/Device KIT Check blood sugar twice a day and as directed. Dx E11.9 1 kit 0  . budesonide-formoterol (SYMBICORT) 160-4.5 MCG/ACT inhaler Inhale 2 puffs into the lungs 2 (two) times daily. 1 Inhaler 0  . Cholecalciferol (VITAMIN D3) 1000 units CAPS Take 1,000 Units by mouth daily.     . fenofibrate 160 MG tablet Take 1 tablet (160 mg total) by mouth daily. 90 tablet 3  . glucose blood (ONETOUCH VERIO) test strip Check blood sugar twice a day and as directed. Dx E11.9 100 each 5  . Insulin Glargine (LANTUS SOLOSTAR) 100 UNIT/ML Solostar Pen Inject 14 Units into the skin 2 (two) times daily.    . Insulin Pen Needle (BD PEN NEEDLE NANO U/F) 32G X 4 MM MISC Use to inject lantus twice a day and as instructed. Dx E11.9 100 each 6  . LANTUS SOLOSTAR 100 UNIT/ML Solostar Pen     . latanoprost (XALATAN) 0.005 % ophthalmic solution Place 1  drop into the left eye at bedtime.   3  . metoprolol tartrate (LOPRESSOR) 25 MG tablet Take 12.5 mg by mouth 2 (two) times daily.    . modafinil (PROVIGIL) 200 MG tablet Take 1 tablet (200 mg total) by mouth daily. 90 tablet 3  . Multiple Vitamins-Minerals (MULTIVITAMIN MEN PO) Take 1 tablet by mouth daily.     . Omega-3 Fatty Acids (FISH OIL) 1000 MG CAPS Take 2,000 capsules by mouth 2 (two) times daily.     . ONE TOUCH LANCETS MISC Check blood sugar twice a day and as directed. Dx E11.9 100 each 5  . potassium chloride 20 MEQ TBCR Take 20 mEq by mouth daily. 30 tablet 3  . rosuvastatin (CRESTOR) 10 MG tablet Take 10 mg by mouth every night 90 tablet  3  . spironolactone (ALDACTONE) 25 MG tablet Take 25 mg by mouth daily.    . tamsulosin (FLOMAX) 0.4 MG CAPS capsule Take 0.4 mg by mouth in the every other night (Patient not taking: Reported on 11/09/2016) 45 capsule 3  . torsemide (DEMADEX) 20 MG tablet Take 59m in the AM and 252min the PM 120 tablet 2   No current facility-administered medications for this encounter.     Allergies:   No known allergies    Social History:  The patient  reports that he quit smoking about 28 years ago. His smoking use included Cigarettes. He has a 20.00 pack-year smoking history. He has never used smokeless tobacco. He reports that he drinks about 1.8 oz of alcohol per week . He reports that he does not use drugs.   Family History:  The patient's family history includes AAA (abdominal aortic aneurysm) in his father; Coronary artery disease in his brother; Diabetes in his brother, mother, and sister; Heart disease in his mother.    ROS:  Please see the history of present illness.   Otherwise, review of systems are positive for none.   All other systems are reviewed and negative.   PHYSICAL EXAM: VS:  BP (!) 88/64 (BP Location: Left Arm, Patient Position: Sitting, Cuff Size: Normal)   Pulse 86   Wt 216 lb 6.4 oz (98.2 kg)   SpO2 94%   BMI 30.83 kg/m  , BMI  There is no height or weight on file to calculate BMI. General:  NAD. Walked in the clinic.  Wife present.  HEENT: normal Neck: supple. JVP ~10 . Carotids 2+ bilat; no bruits. No lymphadenopathy or thryomegaly appreciated. Cor: PMI nondisplaced. Regular rate & rhythm. No rubs, gallops or murmurs. Lungs: CTAB Abdomen: soft,ND,NT.  No hepatosplenomegaly. No bruits or masses. Good bowel sounds. Extremities: no cyanosis, clubbing, rash, R>L 2+//1+ Warm Neuro: alert & orientedx3, cranial nerves grossly intact. moves all 4 extremities w/o difficulty. Affect pleasant   Recent Labs: 07/11/2016: TSH 3.712 07/19/2016: Magnesium 2.2 08/03/2016: Brain Natriuretic Peptide 485.2; Hemoglobin 11.6; Platelets 267 11/01/2016: ALT 19 12/06/2016: BUN 28; Creatinine, Ser 1.58; Potassium 4.1; Sodium 133    Lipid Panel    Component Value Date/Time   CHOL 95 11/01/2016 0811   TRIG 84.0 11/01/2016 0811   HDL 20.00 (L) 11/01/2016 0811   CHOLHDL 5 11/01/2016 0811   VLDL 16.8 11/01/2016 0811   LDLCALC 58 11/01/2016 0811   LDLDIRECT 71.0 11/24/2015 0831      Wt Readings from Last 3 Encounters:  11/29/16 234 lb 8 oz (106.4 kg)  11/28/16 234 lb (106.1 kg)  11/09/16 227 lb 6.4 oz (103.1 kg)     No flowsheet data found.    ASSESSMENT AND PLAN: 1. Chronic systolic heart failure - due to ICM. Last echo 1/18 EF 40-45% --NYHA III-IIIB but functionally seems better  Reds Vest Reading last visit 45% > 38. Volume status improving but still elevated. Looks like another 5 pounds or so to go.  Continue  torsemide to 40/20 add kcl 20.  --BP to low for ARB/BB Do the following things EVERYDAY: 1) Weigh yourself in the morning before breakfast. Write it down and keep it in a log. 2) Take your medicines as prescribed 3) Eat low salt foods-Limit salt (sodium) to 2000 mg per day.  4) Stay as active as you can everyday 5) Limit all fluids for the day to less than 2 liters BMET today.  2.  Coronary artery disease  involving native coronary arteries s/p CABG 10/17 No CP. Continue ASA/statin. No bb with low BP.  --No CP. Continue ASA and statin 3. History of atrial flutter status post RF ablation in 2015: No evidence of recurrent arrhythmia. 4. Essential hypertension:BP low.  5. OSA- Having difficulty with CPAP. I have asked him to follow up with Tommy Sullivan.   Follow up in 3 weeks with Tommy Sullivan. BMET and BNP today. Pharmacy contacted PCP for ongoing issues with hypoglycemia.   Darrick Grinder, NP  12/13/2016 10:08 AM

## 2016-12-13 NOTE — Patient Instructions (Signed)
Routine lab work today. Will notify you of abnormal results, otherwise no news is good news!  No changes to medication today.  Follow up as scheduled.  Do the following things EVERYDAY: 1) Weigh yourself in the morning before breakfast. Write it down and keep it in a log. 2) Take your medicines as prescribed 3) Eat low salt foods-Limit salt (sodium) to 2000 mg per day.  4) Stay as active as you can everyday 5) Limit all fluids for the day to less than 2 liters

## 2016-12-13 NOTE — Telephone Encounter (Signed)
Pt left v/m; pt has stopped taking insulin until hears back from Dr Diona Browner. FBS last week while taking Lantus 13 units was 58 and 76; then reduced Lantus to 12 units and FBS was 57. Reduced lantus to 10 units and FBS was 55. Pt stopped lantus on 12/12/16 and FBS was 78. Today FBS was 89 and still no insulin. Pt said he feels OK. Pt saw cardiologist today and in 2 weeks pt has lost 17 lbs; Card trying to get excess fluid off pt.Today BP was 92/65 at cards office. Pt request cb with Dr Diona Browner instruction about FBS and insulin. Pt last seen 11/07/16.

## 2016-12-15 NOTE — Telephone Encounter (Signed)
Notified patient.  He is concerned because he states that he initially started calling 4 days ago but never received a call back.  * I informed him that the initial message has a date of 12/13/16 on it; however, patient insists that he called earlier in the week.    He already started himself back on his insulin at a reduced amount at 6units.  He is averaging around 120's for BS PP.  He will call if any further concerns or if levels run too high or too low per MD's request.  I have informed patient that I will investigate further to see why a breakdown occurred and apologized for the delay.  We did discuss the weather closings from this week being a possible culprit.  He verbalizes understanding and is okay but appreciates the concern to investigate.  Marland Kitchen

## 2016-12-15 NOTE — Telephone Encounter (Signed)
OAky to continue to hold insulin.Marland Kitchen watch blood sugars fasting and once a day 2 hours post prandial. IF FBS remain below 100, do not restart insulin. If the increase above 120 restart at 2 Units  lantus and titrate Up 2 units every 2 days until FBS back below 120.   Let me know if FBS remain lo and post prandials above 200.

## 2016-12-16 ENCOUNTER — Other Ambulatory Visit: Payer: Self-pay | Admitting: Cardiothoracic Surgery

## 2016-12-18 NOTE — Telephone Encounter (Signed)
Please call to get an update on pt's blood sugars.

## 2016-12-18 NOTE — Telephone Encounter (Signed)
Spoke with Mr. Amos.  He states his sugars have basically been good.  This morning his fasting was 82 mg/dl.  He is currently doing 10 units of insulin at night. He states his evening blood sugars have also been in good range.  The lowest readings tend to be in the morning.   He was not able to give me exact numbers due to he was on an exercise machine and his blood sugar readings were not close by.  We agreed to touch base tomorrow around 10 am so he could give me his exact glucose readings.

## 2016-12-19 NOTE — Telephone Encounter (Signed)
Patient is aware to expect call from [facility] with ENT appointment.

## 2016-12-19 NOTE — Telephone Encounter (Signed)
Referral sent. Let pt know.

## 2016-12-19 NOTE — Telephone Encounter (Signed)
Will proceed with ENT referral.

## 2016-12-19 NOTE — Telephone Encounter (Signed)
Tommy Sullivan notified as instructed by telephone. Medication list updated.  Appointment scheduled for 01/23/17 at 4:00 pm with Dr. Diona Browner, with fasting lab appointment 01/22/2017 at 8:30 am. He states that the echo in his head is very hard to explain.  He feels like he is sitting in an auditorium and everything is very loud.  He states it feels hollow in his head.  Mostly on the right side but sometimes bilateral.  There is no ringing in his ears and he is not having any allergy/congestion symptoms.  He does have some dizziness.  He states everything is loud which makes it hard to ear but doesn't feel he has any hearing loss.  He states he has been dealing with this for 4 to 5 months.

## 2016-12-19 NOTE — Telephone Encounter (Signed)
Spoke with Mr. Fornwalt to get his blood sugar readings:  12/09/2016 FBS-70 mg/dl on 13 units insulin bid. 12/10/2016 FBS-57 mg/dl on 12 units insulin bid 12/11/2016 FBS-55 mg/dl on no insulin 12/12/2016 FBS-78 mg/dl on no insulin 12/13/2016 FBS-89 mg/dl on 10 units insulin bid 12/14/2016 FBS-102 mg/dl on 10 units insulin bid 12/15/2016 FBS-109 mg/dl on 10 units insulin bid 12/16/2016 FBS- 92 mg/dl on 10 units insulin bid 12/17/2016 FBS-76 mg/dl on 10 units insulin bid 12/18/2016 FBS-88 mg/dl on 10 units insulin bid 12/19/2016 FBS 90 mg/dl on 10 units insulin bid  He states his blood pressure has been low but today it was 105/70. His weight is continuing to come down.  So far he is down 20 lbs. He is asking for a referral to ENT. He is complaining of a echo in his head all time since leaving the hospital and someone told him his needs to see an ENT.

## 2016-12-19 NOTE — Telephone Encounter (Signed)
Blood sugars look good on 10 units BID.Marland Kitchen Agree with continuing at that dose unless blood sugars dropping again with further weight loss. Have pt move next OV with me up to in 1 month for DM follow up with labs prior.   Please obtain more info about the echo in head... Is it heart beat sound in ears/ears?, ringing? one side or bilateral? Any vertigo? any hearing loss?  Any allergy or congestion symtpoms?

## 2016-12-27 DIAGNOSIS — H40002 Preglaucoma, unspecified, left eye: Secondary | ICD-10-CM | POA: Diagnosis not present

## 2016-12-28 ENCOUNTER — Encounter: Payer: Self-pay | Admitting: Family Medicine

## 2017-01-02 ENCOUNTER — Ambulatory Visit (HOSPITAL_COMMUNITY)
Admission: RE | Admit: 2017-01-02 | Discharge: 2017-01-02 | Disposition: A | Discharge status: Home / Self Care | Payer: PPO | Source: Ambulatory Visit | Attending: Internal Medicine | Admitting: Internal Medicine

## 2017-01-02 ENCOUNTER — Encounter (HOSPITAL_COMMUNITY): Payer: Self-pay | Admitting: Internal Medicine

## 2017-01-02 VITALS — BP 94/64 | HR 81 | Wt 208.0 lb

## 2017-01-02 DIAGNOSIS — M109 Gout, unspecified: Secondary | ICD-10-CM | POA: Diagnosis not present

## 2017-01-02 DIAGNOSIS — I252 Old myocardial infarction: Secondary | ICD-10-CM | POA: Insufficient documentation

## 2017-01-02 DIAGNOSIS — I2510 Atherosclerotic heart disease of native coronary artery without angina pectoris: Secondary | ICD-10-CM | POA: Diagnosis not present

## 2017-01-02 DIAGNOSIS — I255 Ischemic cardiomyopathy: Secondary | ICD-10-CM | POA: Insufficient documentation

## 2017-01-02 DIAGNOSIS — Z8249 Family history of ischemic heart disease and other diseases of the circulatory system: Secondary | ICD-10-CM | POA: Insufficient documentation

## 2017-01-02 DIAGNOSIS — R110 Nausea: Secondary | ICD-10-CM | POA: Insufficient documentation

## 2017-01-02 DIAGNOSIS — E119 Type 2 diabetes mellitus without complications: Secondary | ICD-10-CM | POA: Insufficient documentation

## 2017-01-02 DIAGNOSIS — R42 Dizziness and giddiness: Secondary | ICD-10-CM | POA: Diagnosis not present

## 2017-01-02 DIAGNOSIS — Z833 Family history of diabetes mellitus: Secondary | ICD-10-CM | POA: Insufficient documentation

## 2017-01-02 DIAGNOSIS — I110 Hypertensive heart disease with heart failure: Secondary | ICD-10-CM | POA: Insufficient documentation

## 2017-01-02 DIAGNOSIS — Z794 Long term (current) use of insulin: Secondary | ICD-10-CM | POA: Diagnosis not present

## 2017-01-02 DIAGNOSIS — Z951 Presence of aortocoronary bypass graft: Secondary | ICD-10-CM | POA: Diagnosis not present

## 2017-01-02 DIAGNOSIS — I5022 Chronic systolic (congestive) heart failure: Secondary | ICD-10-CM | POA: Insufficient documentation

## 2017-01-02 DIAGNOSIS — Z9889 Other specified postprocedural states: Secondary | ICD-10-CM | POA: Diagnosis not present

## 2017-01-02 DIAGNOSIS — E1340 Other specified diabetes mellitus with diabetic neuropathy, unspecified: Secondary | ICD-10-CM

## 2017-01-02 DIAGNOSIS — J45909 Unspecified asthma, uncomplicated: Secondary | ICD-10-CM | POA: Diagnosis not present

## 2017-01-02 DIAGNOSIS — G4733 Obstructive sleep apnea (adult) (pediatric): Secondary | ICD-10-CM | POA: Diagnosis not present

## 2017-01-02 DIAGNOSIS — Z7982 Long term (current) use of aspirin: Secondary | ICD-10-CM | POA: Insufficient documentation

## 2017-01-02 DIAGNOSIS — F17210 Nicotine dependence, cigarettes, uncomplicated: Secondary | ICD-10-CM | POA: Diagnosis not present

## 2017-01-02 DIAGNOSIS — E785 Hyperlipidemia, unspecified: Secondary | ICD-10-CM | POA: Diagnosis not present

## 2017-01-02 DIAGNOSIS — I4891 Unspecified atrial fibrillation: Secondary | ICD-10-CM | POA: Diagnosis not present

## 2017-01-02 DIAGNOSIS — I4892 Unspecified atrial flutter: Secondary | ICD-10-CM | POA: Diagnosis not present

## 2017-01-02 DIAGNOSIS — H409 Unspecified glaucoma: Secondary | ICD-10-CM | POA: Diagnosis not present

## 2017-01-02 DIAGNOSIS — Z79899 Other long term (current) drug therapy: Secondary | ICD-10-CM | POA: Insufficient documentation

## 2017-01-02 MED ORDER — METOPROLOL SUCCINATE ER 25 MG PO TB24: 25.0000 mg | ORAL_TABLET | Freq: Every day | ORAL | 3 refills | Status: DC

## 2017-01-02 NOTE — Patient Instructions (Signed)
STOP taking Lopressor 25 mg  START taking Troprol XL-25 mg (1 Tablet) Once Daily at bedtime.  Start taking this tomorrow April 4th, 2018.  48-Hour Holter monitor has been ordered for you, you will pick this up at the office on [facility].  Outpatient Physical Therapy for Right-Leg has been ordered for you, they will contact you for initial appointment.  Follow up with 8 weeks.

## 2017-01-02 NOTE — Progress Notes (Signed)
ADVANCED HEART FAILURE CLINIC CONSULT NOTE   Date:  01/02/2017   ID:  Tommy Sullivan, DOB November 10, 1945, MRN [id number]  PCP:  Eliezer Lofts, MD  Cardiologist:   Fletcher Anon HF MD: Dr Haroldine Laws  Pulmonary; Dr Annamaria Boots    History of Present Illness: Tommy Sullivan is a 71 y.o. male with a h/o CAD s/p CABG, moderate COPD, DM2, OSA on CPAP, HTN, chronic systolic heart failure (most recent EF 40-45%) and atrial flutter status post ablation who is referred by Dr. Fletcher Anon for evaluation in the HF Clinic.   He had amyocardial infarction  in 2009 with PCI done by Dr. Collene Mares. He underwent atrial flutter ablation in August 2015 by Dr. Caryl Comes.  He was seen in October for significant worsening of exertional dyspnea. He underwent a stress test which showed evidence of prior inferior infarct with a decrease in ejection fraction. He underwent cardiac catheterization which showed severe three-vessel coronary artery disease with an ejection fraction of 45%. The patient underwent CABG by Dr. Prescott Gum in 10/17. He had postoperative atrial fibrillation which was successfully treated with amiodarone. The patient developed volume overload that required continuation of torsemide.   He had an echocardiogram done in October after CABG which showed a drop in LV systolic function with an EF of 30-35%. He had a repeat echocardiogram 1/18 which showed an EF of 40-45%, moderate mitral regurgitation, moderate to severe tricuspid regurgitation with peak systolic pressure of 41 mmHg. He was started on spironolactone in addition to torsemide and volume status improved.   Today he returns for HF follow up. Having good days and bad days. Today he is not feeling well. Says he feels better when his weight is down to 205-206 pounds. Complaining of nausea today. Denies fever. Complaining of chills this morning. Weight at home 205-209 pounds. Complaining of fatigue. SOB with inclines but he recovers a little faster.  +Orthopnea. Heart rate goes  up and down. Says heart rate is  irregular. Dizzy when bends over. Poor appetite.  Unable to use CPAP at this time. Taking all medications. Home SBP 86-104.   ECHO 10/2016 EF 40-45%. Peak PA pressure 41 mm hgm    PFTs FEV1 1.85 (58%) FVC  3.01 (69%) DLCO 79%  Past Medical History:  Diagnosis Date  . Allergy   . Arthritis   . Atrial flutter ([place])    a/ 05/2014 s/p RFCA.  Marland Kitchen CAD (coronary artery disease) 10/2007   a. 10/2007 s/p MI/PCI of RCA [facility] - Dr. Collene Mares);  b. 06/2016 MV: EF 34%, inf/inflat infarct; c. 07/2016 Cath: LM nl, LAd 90ost/prox, 94m 99d, D2 30, RI small, LCX 1061mOm1 40, RCA 50ost, 5 ISR, 9029m0d, RPDA  80ost, RPL2 70, EF 45-50%;  d. 07/2016 CABG x 5 (LIMA->LAD, VG->Diag, VG->RI, VG->PDA->RPL).  . Chronic systolic CHF (congestive heart failure) [place]  a. 07/2016 Echo: Ef 30-35%.  . Diabetes mellitus without complication ([place] . Extrinsic asthma, unspecified   . Glaucoma   . Gout   . Hyperlipidemia   . Hypertensive heart disease   . Ischemic cardiomyopathy    a. 07/2016 Echo: Ef 30-35%, mildly dil LA, mild MR/TR, PASP 24 mmHg.  . 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 07/2016 - placed on amio post-op.  . RMarland Kitchentinal vascular occlusion, unspecified    Right  . Sleep apnea     Past Surgical History:  Procedure Laterality Date  . ABLATION  05-07-2014   EPS and  ablation of atrial flutter by Dr Caryl Comes  . APPENDECTOMY  1954  . ATRIAL FLUTTER ABLATION N/A 05/07/2014   Procedure: ATRIAL FLUTTER ABLATION;  Surgeon: Deboraha Sprang, MD;  [facility] C[facility] LAB;  Service: Cardiovascular;  Laterality: N/A;  . CARDIAC CATHETERIZATION Left 07/10/2016   Procedure: Left Heart Cath and Coronary Angiography;  Surgeon: Wellington Hampshire, MD;  Location: [place] CV LAB;  Service: Cardiovascular;  Laterality: Left;  . COLONOSCOPY    . CORONARY ANGIOPLASTY  2009   stent [place]  . CORONARY ARTERY BYPASS GRAFT N/A 07/14/2016   Procedure: CORONARY ARTERY BYPASS GRAFTING  (CABG), ON PUMP, TIMES FIVE, USING LEFT INTERNAL MAMMARY ARTERY, RIGHT GREATER SAPHENOUS VEIN HARVESTED ENDOSCOPICALLY;  Surgeon: Ivin Poot, MD;  Location: [place];  Service: Open Heart Surgery;  Laterality: N/A;  SVG to PD and PL sequential SVG to 1st diagonal SVG to [place] LIMA to LAD  . NECK SURGERY  1990's   x2, Fusion  . SHOULDER ARTHROSCOPY WITH SUBACROMIAL DECOMPRESSION Left 04/02/2014   Procedure: SHOULDER ARTHROSCOPY WITH SUBACROMIAL DECOMPRESSION WITH ROTATOR CUFF REPAIR;  Surgeon: Nita Sells, MD;  Location: [place];  Service: Orthopedics;  Laterality: Left;  Left shoulder rotator cuff repair, subacromial decompression  . SKIN SURGERY    . TEE WITHOUT CARDIOVERSION N/A 07/14/2016   Procedure: TRANSESOPHAGEAL ECHOCARDIOGRAM (TEE);  Surgeon: Ivin Poot, MD;  Location: [place];  Service: Open Heart Surgery;  Laterality: N/A;  . TONSILLECTOMY       Current Outpatient Prescriptions  Medication Sig Dispense Refill  . aspirin EC 325 MG EC tablet Take 1 tablet (325 mg total) by mouth daily. 30 tablet 0  . Blood Glucose Monitoring Suppl (ONETOUCH VERIO IQ SYSTEM) w/Device KIT Check blood sugar twice a day and as directed. Dx E11.9 1 kit 0  . Cholecalciferol (VITAMIN D3) 1000 units CAPS Take 1,000 Units by mouth daily.     . fenofibrate 160 MG tablet Take 1 tablet (160 mg total) by mouth daily. 90 tablet 3  . glucose blood (ONETOUCH VERIO) test strip Check blood sugar twice a day and as directed. Dx E11.9 100 each 5  . Insulin Glargine (LANTUS SOLOSTAR) 100 UNIT/ML Solostar Pen Inject 10 Units into the skin 2 (two) times daily.     . Insulin Pen Needle (BD PEN NEEDLE NANO U/F) 32G X 4 MM MISC Use to inject lantus twice a day and as instructed. Dx E11.9 100 each 6  . latanoprost (XALATAN) 0.005 % ophthalmic solution Place 1 drop into the left eye at bedtime.   3  . metoprolol tartrate (LOPRESSOR) 25 MG tablet Take 12.5 mg by mouth 2 (two) times daily.    .  modafinil (PROVIGIL) 200 MG tablet Take 1 tablet (200 mg total) by mouth daily. 90 tablet 3  . Multiple Vitamins-Minerals (MULTIVITAMIN MEN PO) Take 1 tablet by mouth daily.     . Omega-3 Fatty Acids (FISH OIL) 1000 MG CAPS Take 2,000 capsules by mouth 2 (two) times daily.     . ONE TOUCH LANCETS MISC Check blood sugar twice a day and as directed. Dx E11.9 100 each 5  . potassium chloride 20 MEQ TBCR Take 20 mEq by mouth daily. 30 tablet 3  . rosuvastatin (CRESTOR) 10 MG tablet Take 10 mg by mouth every night 90 tablet 3  . spironolactone (ALDACTONE) 25 MG tablet Take 1 tablet (25 mg total) by mouth daily. 90 tablet 3  . torsemide (DEMADEX) 20  MG tablet Take 20-40 mg by mouth as directed. Take 36m in the AM and 230min the PM    . budesonide-formoterol (SYMBICORT) 160-4.5 MCG/ACT inhaler Inhale 2 puffs into the lungs 2 (two) times daily. (Patient not taking: Reported on 12/13/2016) 1 Inhaler 0   No current facility-administered medications for this encounter.     Allergies:   No known allergies    Social History:  The patient  reports that he quit smoking about 28 years ago. His smoking use included Cigarettes. He has a 20.00 pack-year smoking history. He has never used smokeless tobacco. He reports that he drinks about 1.8 oz of alcohol per week . He reports that he does not use drugs.   Family History:  The patient's family history includes AAA (abdominal aortic aneurysm) in his father; Coronary artery disease in his brother; Diabetes in his brother, mother, and sister; Heart disease in his mother.    ROS:  Please see the history of present illness.   Otherwise, review of systems are positive for none.   All other systems are reviewed and negative.   PHYSICAL EXAM: VS:  BP 94/64   Pulse 81   Wt 208 lb (94.3 kg)   SpO2 99%   BMI 29.63 kg/m  , BMI Body mass index is 29.63 kg/m. General:  Walked into clinic with his wife NAD HEENT: normal Neck: supple. JVP 6-7 . Carotids 2+ bilat; no  bruits. No lymphadenopathy or thryomegaly appreciated. Cor: PMI nondisplaced. RRR no m/r/g Lungs:clear. No wheeze Abdomen: soft, NT/ND.  No hepatosplenomegaly. No bruits or masses. God bowels sounds Extremities: no cyanosis, clubbing, rash, Trace - 1+ edema L>R  Compression stockings present.  Neuro: A&Ox3. MAE x4 w/o difficulty. Affect pleasant   Recent Labs: 07/11/2016: TSH 3.712 07/19/2016: Magnesium 2.2 08/03/2016: Hemoglobin 11.6; Platelets 267 11/01/2016: ALT 19 12/13/2016: B Natriuretic Peptide 1,497.5; BUN 32; Creatinine, Ser 1.58; Potassium 4.2; Sodium 134    Lipid Panel    Component Value Date/Time   CHOL 95 11/01/2016 0811   TRIG 84.0 11/01/2016 0811   HDL 20.00 (L) 11/01/2016 0811   CHOLHDL 5 11/01/2016 0811   VLDL 16.8 11/01/2016 0811   LDLCALC 58 11/01/2016 0811   LDLDIRECT 71.0 11/24/2015 0831      Wt Readings from Last 3 Encounters:  01/02/17 208 lb (94.3 kg)  12/13/16 216 lb 6.4 oz (98.2 kg)  11/29/16 234 lb 8 oz (106.4 kg)     No flowsheet data found.    ASSESSMENT AND PLAN: 1. Chronic systolic heart failure - due to ICM. Last echo 1/18 EF 40-45% --NYHA II-III Todays Reds Vest reading is 37 today Volume status stable. Reds Vest Reading last visit 45% > 38. Volume status improving but still elevated. Continue  torsemide to 40/20 add kcl 20.  - Stop lopressor and start Toprol XL 25 mg at bed time.  -Continue 25 mg spiro daily.  --BP to low for ARB.  2.  Coronary artery disease involving native coronary arteries s/p CABG 10/17 No CP. Continue aspirin and statin.  3. History of atrial flutter status post RF ablation in 2015: Heart variable. Place 48 hour holter monitor.  4. Essential hypertension:BP low.  5. OSA- Having difficulty with CPAP. I have asked him to use CPAP again.    Follow up in 8 weeks.  AmDarrick GrinderNP  01/02/2017 2:44 PM     Patient seen and examined with AmDarrick GrinderNP. We discussed all aspects of the encounter. I agree with  the  assessment and plan as stated above.   Although he continues to be pessimistic about his situation. His wife and I agree he is improved with diuresis. He is able to ride his exercise bike without any trouble but still struggles a bit on the treadmill. ReDS vest reading improving at 37% but still slightly elevated. I have told him to increase torsemide to 40 bid on Monday and Fridays. Continue 40/20 other days. Ride stationary bike 20-240 mins 5 days per week. Refer to PT to evaluate R quadriceps pain and burning. BP too low to titrate HF meds. Will switch lopressor to Toprol XL 25 qhs.   Glori Bickers, MD  10:54 PM

## 2017-01-08 ENCOUNTER — Other Ambulatory Visit: Payer: Self-pay | Admitting: Otolaryngology

## 2017-01-08 DIAGNOSIS — H903 Sensorineural hearing loss, bilateral: Secondary | ICD-10-CM | POA: Diagnosis not present

## 2017-01-08 DIAGNOSIS — R42 Dizziness and giddiness: Secondary | ICD-10-CM

## 2017-01-08 DIAGNOSIS — R490 Dysphonia: Secondary | ICD-10-CM | POA: Diagnosis not present

## 2017-01-08 DIAGNOSIS — H93299 Other abnormal auditory perceptions, unspecified ear: Secondary | ICD-10-CM | POA: Diagnosis not present

## 2017-01-10 ENCOUNTER — Ambulatory Visit (INDEPENDENT_AMBULATORY_CARE_PROVIDER_SITE_OTHER): Payer: PPO | Admitting: Internal Medicine

## 2017-01-10 ENCOUNTER — Other Ambulatory Visit (HOSPITAL_COMMUNITY): Payer: Self-pay | Admitting: *Deleted

## 2017-01-10 ENCOUNTER — Encounter: Payer: Self-pay | Admitting: Internal Medicine

## 2017-01-10 ENCOUNTER — Other Ambulatory Visit (HOSPITAL_COMMUNITY): Payer: Self-pay | Admitting: Cardiology

## 2017-01-10 ENCOUNTER — Ambulatory Visit (INDEPENDENT_AMBULATORY_CARE_PROVIDER_SITE_OTHER): Payer: PPO

## 2017-01-10 VITALS — BP 112/68 | HR 68 | Ht 69.5 in | Wt 208.0 lb

## 2017-01-10 DIAGNOSIS — I4891 Unspecified atrial fibrillation: Secondary | ICD-10-CM

## 2017-01-10 DIAGNOSIS — G4733 Obstructive sleep apnea (adult) (pediatric): Secondary | ICD-10-CM | POA: Diagnosis not present

## 2017-01-10 DIAGNOSIS — Z9989 Dependence on other enabling machines and devices: Secondary | ICD-10-CM | POA: Diagnosis not present

## 2017-01-10 MED ORDER — TORSEMIDE 20 MG PO TABS: ORAL_TABLET | ORAL | 3 refills | Status: DC

## 2017-01-10 NOTE — Addendum Note (Signed)
Addended by: Scarlette Calico on: 01/10/2017 12:54 PM   Modules accepted: Orders

## 2017-01-10 NOTE — Progress Notes (Signed)
Subjective:    Patient ID: Tommy Sullivan, male    DOB: 12-27-1945, 71 y.o.   MRN: [id number]  HPI male former smoker followed for OSA, complicated by CAD/Atrial flutter/ MI 2009, DM 2, HBP NPSG 2007:  AHI 16/hr, PLMS 23/hr (?related to SDB?)  Titration 2007:  Optimal pressure 16, PLMS still 15/hr   Echocardiogram 10/05/16-EF 40-45 percent, PA peak 41 mm Hg PFT 07/11/16- mod severe obstruction, mild Diffusion deficit, response to BD ------------------------------------------------------------------------------------  11/09/2016 71-old male former smoker followed for OSA, complicated by CAD/atrial flutter/MI 2009, DM 2, HBP CPAP 14/[facility] FOLLOWS FOR: Has not worn CPAP since October d/t having open heart surgery. Pt states that it does not feel right when he tries to wear it. Pt states that he cannot tell it is too much pressure or not enough. DME: [employer] ; Pt states that since open heart surgery that he is unable to make it to the top of a flight of stairs or long distances without stopping to catch his breath. Worse with fluid retention. He feels smothered trying to wear CPAP. Lost weight after surgery but has been retaining fluid. Wife says he snores less. Sleeps flat with nocturia 0-1. Echocardiogram 10/05/16-EF 40-45 percent, PA peak 41 mm Hg PFT 07/11/16- mod severe obstruction, mild Diffusion deficit, response to BD CXR 08/03/2016- IMPRESSION: Persistent small left pleural effusion. Lungs elsewhere clear. Stable cardiac silhouette.  01/10/2017- 71-old male former smoker followed for OSA/failed CPAP, complicated by CAD/atrial flutter/MI 2009, DM 2, HBP CPAP 14/[facility] FOLLOWS FOR: DME: [employer]. Pt states he has not used CPAP since his hospital stay. Pt is taking CPAP back to [facility] today. Has lost 30 pounds Download- Apparently the download accesses somebody else's information. He and his wife both insist he has not been using CPAP at all. This is being brought to attention of his DME company. He had  ENT evaluation at [facility] for hoarseness and pressure in his ears. Wife confirms he is not snoring now and seems to be sleeping quietly and comfortably since he accomplished weight loss. He has been having a lot of medical evaluation for a variety of health problems, including diuresis by cardiology.  Review of Systems  + = pos Constitutional: Negative for fever and unexpected weight change.  HENT: Negative for congestion, dental problem, ear pain, nosebleeds, postnasal drip, rhinorrhea, sinus pressure, sneezing, sore throat and trouble swallowing.   Eyes: Negative for redness and itching.  Respiratory: Negative for cough, chest tightness, +shortness of breath and wheezing.   Cardiovascular: Negative for palpitations and leg swelling.  Gastrointestinal: Negative for nausea and vomiting.  Genitourinary: Negative for dysuria.  Musculoskeletal: Negative for joint swelling.  Skin: Negative for rash.  Neurological: Negative for headaches.  Hematological: Does not bruise/bleed easily.  Psychiatric/Behavioral: Negative for dysphoric mood. The patient is not nervous/anxious.      Objective:  OBJ- Physical Exam General- Alert, Oriented, Affect-appropriate, Distress- none acute, + Weight is down 26 pounds since February Skin- rash-none, lesions- none, excoriation- none Lymphadenopathy- none Head- atraumatic            Eyes- + R strabismus/ blind            Ears- Hearing, canals-normal            Nose- Clear, no-Septal dev, mucus, polyps, erosion, perforation             Throat- Mallampati II-III , mucosa clear , drainage- none, tonsils- atrophic + hoarse Neck- flexible , trachea midline, no stridor ,  thyroid nl, carotid no bruit Chest - symmetrical excursion , unlabored           Heart/CV- RRR , no murmur , no gallop  , no rub, nl s1 s2                           - JVD- none , edema- none, stasis changes- none, varices- none           Lung- + rhonchi left upper lobe, dry cough on command  wheeze- none, , dullness-none, rub- none           Chest wall-  Abd-  Br/ Gen/ Rectal- Not done, not indicated Extrem- cyanosis- none, clubbing, none, atrophy- none, strength- nl, + support hose Neuro- grossly intact to observation   Assessment & Plan:

## 2017-01-10 NOTE — Patient Instructions (Addendum)
   Order- schedule unattended home sleep test     Dx OSA   I hope the other medical problems get resolved.  Recommend you try to sleep off the flat of your back if not using CPAP  Please call as needed

## 2017-01-12 NOTE — Assessment & Plan Note (Signed)
Has not been using CPAP because of ENT discomforts which are being evaluated. He has lost a lot of weight and his wife now confirms that he is not snoring and seems to be breathing comfortably in sleep without CPAP. We talked about whether to confirm this subjectively with a sleep study Plan schedule home sleep test

## 2017-01-15 ENCOUNTER — Ambulatory Visit: Payer: PPO | Admitting: Cardiovascular Disease

## 2017-01-17 ENCOUNTER — Ambulatory Visit: Payer: PPO | Admitting: Physical Therapy

## 2017-01-22 ENCOUNTER — Ambulatory Visit: Payer: PPO | Attending: Internal Medicine | Admitting: Physical Therapy

## 2017-01-22 ENCOUNTER — Telehealth: Payer: Self-pay | Admitting: Family Medicine

## 2017-01-22 ENCOUNTER — Encounter: Payer: Self-pay | Admitting: Physical Therapy

## 2017-01-22 ENCOUNTER — Other Ambulatory Visit (INDEPENDENT_AMBULATORY_CARE_PROVIDER_SITE_OTHER): Payer: PPO

## 2017-01-22 VITALS — BP 80/60 | HR 71

## 2017-01-22 DIAGNOSIS — E119 Type 2 diabetes mellitus without complications: Secondary | ICD-10-CM | POA: Diagnosis not present

## 2017-01-22 DIAGNOSIS — M6281 Muscle weakness (generalized): Secondary | ICD-10-CM

## 2017-01-22 DIAGNOSIS — M79604 Pain in right leg: Secondary | ICD-10-CM | POA: Insufficient documentation

## 2017-01-22 DIAGNOSIS — R262 Difficulty in walking, not elsewhere classified: Secondary | ICD-10-CM | POA: Diagnosis not present

## 2017-01-22 LAB — HEMOGLOBIN A1C: Hgb A1c MFr Bld: 6.5 % (ref 4.6–6.5)

## 2017-01-22 NOTE — Therapy (Signed)
[facility] [address] [place], [place], [address] Phone: [phone]   Fax:  [phone]  Physical Therapy Evaluation  Patient Details  Name: Tommy Sullivan MRN: [id number] Date of Birth: [birth year] Referring Provider: Jolaine Artist, MD  Encounter Date: 01/22/2017      PT End of Session - 01/22/17 2001    Visit Number 1   Number of Visits 13   Date for PT Re-Evaluation 03/08/17   Authorization Type [employer] $20 co-pay   PT Start Time 0930   PT Stop Time 1028   PT Time Calculation (min) 58 min   Activity Tolerance Patient limited by pain;Patient limited by fatigue   Behavior During Therapy [facility] for tasks assessed/performed      Past Medical History:  Diagnosis Date  . Allergy   . Arthritis   . Atrial flutter ([place])    a/ 05/2014 s/p RFCA.  Marland Kitchen CAD (coronary artery disease) 10/2007   a. 10/2007 s/p MI/PCI of RCA [facility] - Dr. Collene Mares);  b. 06/2016 MV: EF 34%, inf/inflat infarct; c. 07/2016 Cath: LM nl, LAd 90ost/prox, 62m, 99d, D2 30, RI small, LCX 168m, Om1 40, RCA 50ost, 5 ISR, 71m, 30d, RPDA  80ost, RPL2 70, EF 45-50%;  d. 07/2016 CABG x 5 (LIMA->LAD, VG->Diag, VG->RI, VG->PDA->RPL).  . Chronic systolic CHF (congestive heart failure) ([place])    a. 07/2016 Echo: Ef 30-35%.  . Diabetes mellitus without complication ([place])   . Extrinsic asthma, unspecified   . Glaucoma   . Gout   . Hyperlipidemia   . Hypertensive heart disease   . Ischemic cardiomyopathy    a. 07/2016 Echo: Ef 30-35%, mildly dil LA, mild MR/TR, PASP 24 mmHg.  Marland Kitchen Post-op Afib following CABG (07/2016)    a. 07/2016 - placed on amio post-op.  Marland Kitchen Retinal vascular occlusion, unspecified    Right  . Sleep apnea     Past Surgical History:  Procedure Laterality Date  . ABLATION  05-07-2014   EPS and ablation of atrial flutter by Dr Caryl Comes  . APPENDECTOMY  1954  . ATRIAL FLUTTER ABLATION N/A 05/07/2014   Procedure: ATRIAL FLUTTER  ABLATION;  Surgeon: Deboraha Sprang, MD;  Location: [facility] CATH LAB;  Service: Cardiovascular;  Laterality: N/A;  . CARDIAC CATHETERIZATION Left 07/10/2016   Procedure: Left Heart Cath and Coronary Angiography;  Surgeon: Wellington Hampshire, MD;  Location: [place] CV LAB;  Service: Cardiovascular;  Laterality: Left;  . COLONOSCOPY    . CORONARY ANGIOPLASTY  2009   stent [place]  . CORONARY ARTERY BYPASS GRAFT N/A 07/14/2016   Procedure: CORONARY ARTERY BYPASS GRAFTING (CABG), ON PUMP, TIMES FIVE, USING LEFT INTERNAL MAMMARY ARTERY, RIGHT GREATER SAPHENOUS VEIN HARVESTED ENDOSCOPICALLY;  Surgeon: Ivin Poot, MD;  Location: [facility];  Service: Open Heart Surgery;  Laterality: N/A;  SVG to PD and PL sequential SVG to 1st diagonal SVG to Ramus LIMA to LAD  . NECK SURGERY  1990's   x2, Fusion  . SHOULDER ARTHROSCOPY WITH SUBACROMIAL DECOMPRESSION Left 04/02/2014   Procedure: SHOULDER ARTHROSCOPY WITH SUBACROMIAL DECOMPRESSION WITH ROTATOR CUFF REPAIR;  Surgeon: Nita Sells, MD;  Location: [facility];  Service: Orthopedics;  Laterality: Left;  Left shoulder rotator cuff repair, subacromial decompression  . SKIN SURGERY    . TEE WITHOUT CARDIOVERSION N/A 07/14/2016   Procedure: TRANSESOPHAGEAL ECHOCARDIOGRAM (TEE);  Surgeon: Ivin Poot, MD;  Location: [facility];  Service: Open Heart Surgery;  Laterality: N/A;  .  TONSILLECTOMY      Vitals:   01/22/17 0946  BP: (!) 80/60  Pulse: 71  SpO2: 97%         Subjective Assessment - 01/22/17 0947    Subjective Pt presents to OPPT following completion of cardiac rehabilitation and now presents with right leg pain that is constant but is exacerbated by walking up stairs and walking long distances.  RLE is same LE that saphenous vein was harvested from for graft for CABG.  Did not have this pain prior to CABG.  Pain can feel like tightness in quad or sharp, stabbing "knife" pain in lateral and posterior leg.  Has had  vascular flow studies-WFL.   Patient is accompained by: Family member   Pertinent History Monitor vitals!  Significant cardiac history: MI, CAD with CABG x 5, CHF, AFIB, HTN, DM, asthma, gout, ischemic cardiomyopathy, OSA, cervical fusion, L RTC repair   Limitations Walking   Patient Stated Goals To find out where the pain is coming from and ease the pain   Currently in Pain? Yes   Pain Score 5    Pain Location Leg   Pain Orientation Right;Mid;Upper   Pain Descriptors / Indicators Tightness;Stabbing;Cramping   Pain Type Chronic pain            [facility] PT Assessment - 01/22/17 0956      Assessment   Medical Diagnosis neuropathy, RLE pain   Referring Provider Jolaine Artist, MD   Onset Date/Surgical Date 07/14/16   Prior Therapy cardiac rehabilitation     Precautions   Precautions Sternal;Other (comment)   Precaution Comments Blind in R eye, Monitor vitals!  Significant cardiac history: MI, CAD with CABG x 5, CHF, AFIB, HTN, DM, asthma, gout, ischemic cardiomyopathy, OSA, cervical fusion, L RTC repair     Balance Screen   Has the patient fallen in the past 6 months No     [place] residence   Living Arrangements Spouse/significant other   Type of [place] Access Level entry   Home Layout Two level;Able to live on main level with bedroom/bathroom  computer, exercise equipment downstairs   Alternate Level Stairs-Number of Steps 16   Alternate Level Stairs-Rails Right     Prior Function   Level of Independence Independent     Observation/Other Assessments   Focus on Therapeutic Outcomes (FOTO)  45 (55% limited, 41% predicted limitation)   Other Surveys  Other Surveys   Activities of Balance Confidence Scale (ABC Scale)  81.9%     Sensation   Light Touch Impaired by gross assessment  R lower leg numbness from surgery and prior injuty     ROM / Strength   AROM / PROM / Strength Strength     Strength   Overall Strength  Deficits;Due to pain   Overall Strength Comments 5/5 LLE, 5/5 RLE except hip flexion 4/5 and reports cramping in quad with resistance     Flexibility   Soft Tissue Assessment /Muscle Length yes   Quadriceps hip flexor: lacks 24 deg to full extension   ITB very tender to touch over R ITB; will measure length next session     Ambulation/Gait   Ambulation/Gait --   Stairs Yes   Stairs Assistance 6: Modified independent (Device/Increase time)   Stair Management Technique One rail Right;Alternating pattern;Forwards   Number of Stairs 12   Height of Stairs 6     Standardized Balance Assessment   Standardized Balance  Assessment Five Times Sit to Stand   Five times sit to stand comments  14.7 seconds with mild tightness R quad                           PT Education - 01/22/17 1953    Education provided Yes   Education Details Clinical findings and PT POC and goals   Person(s) Educated Patient;Spouse   Methods Explanation   Comprehension Verbalized understanding          PT Short Term Goals - 01/22/17 2019      PT SHORT TERM GOAL #1   Title (TARGET DATE FOR STG IS 02/13/17) Pt will participate in 6 min walk test of endurance with LTG to be set   Time 3   Period Weeks   Status New     PT SHORT TERM GOAL #2   Title Pt will improve ITB and hip flexor ROM to <15 deg limitation   Baseline lacking 24 deg to full hip extension; ITB TBA   Time 3   Period Weeks   Status New     PT SHORT TERM GOAL #3   Title Pt will improve LE strength as indicated by a 5 time sit to stand time of <12 seconds   Baseline 14.7 seconds with R quad pain   Time 3   Period Weeks   Status New     PT SHORT TERM GOAL #4   Title Pt will negotiate up/down 12 stairs with alternating sequence and one rails Mod I and report <3/10 pain in RLE   Baseline 5/10 tightness in RLE   Time 3   Period Weeks   Status New           PT Long Term Goals - 01/22/17 2022      PT LONG TERM GOAL #1    Title (TARGET DATE FOR ALL LTG IS 03/08/2017) Pt will be independent with LE strength and stretching HEP   Time 6   Period Weeks   Status New     PT LONG TERM GOAL #2   Title 6 minute walk test goal TBD     PT LONG TERM GOAL #3   Title Pt will report improved confidence with functional balance as indicated by ABC score of >90%   Baseline 81.9%   Time 6   Period Weeks   Status New     PT LONG TERM GOAL #4   Title Pt will negotiate 12 stairs alternating sequence without UE support at MOD I and report RLE pain as <2/10   Time 6   Period Weeks   Status New     PT LONG TERM GOAL #5   Title Pt will perform gait x 500' outside over uneven terrain/curb/sidewalk without AD and MOD I with 2/4 DOE and RLE pain <2/10   Time 6   Period Weeks   Status New               Plan - 01/22/17 2002    Clinical Impression Statement Pt is a 71 year old male presenting to OPPT neuro for medium complexity PT evaluation for RLE pain, neuropathy after CABG and harvesting of R greater saphenous vein. Pt's PMH significant for the following: R eye blindness, L RTC repair, cervical fusion, MI, CHF, ischemic cardiomyopathy, CAD with CABG x 5, DM, asthma, gout, HTN, AFIB and OSA. The following deficits were noted during pt's exam: impaired sensation RLE,  pain in RLE that worsens with exertion, DOE and impaired endurance, impaired ROM in R quad and ITB, impaired gait and impaired functional LE strength. Pt's five times sit to stand time indicates pt is at increased risk for falling.  Pt would benefit from skilled PT to address these impairments and functional limitations to maximize functional mobility independence and reduce falls risk.  Pt also reporting changes in R ear hearing and dizziness-pt undergoing work up for CVA of inner ear but will not be addressed with this episode of care unless referral is received.   Rehab Potential Good   Clinical Impairments Affecting Rehab Potential significant cardiac  history   PT Frequency 2x / week   PT Duration 6 weeks   PT Treatment/Interventions ADLs/Self Care Home Management;Moist Heat;Ultrasound;Gait training;Stair training;Functional mobility training;Therapeutic activities;Therapeutic exercise;Balance training;Neuromuscular re-education;Patient/family education;Passive range of motion;Scar mobilization;Energy conservation;Taping   PT Next Visit Plan special tests for claudication of RLE, measure ITB ROM limitations, 6 minute walk test and set LTG, HEP   Consulted and Agree with Plan of Care Patient;Family member/caregiver   Family Member Consulted wife      Patient will benefit from skilled therapeutic intervention in order to improve the following deficits and impairments:  Cardiopulmonary status limiting activity, Decreased activity tolerance, Decreased endurance, Decreased strength, Difficulty walking, Impaired flexibility, Pain, Decreased range of motion, Impaired sensation  Visit Diagnosis: Pain in right leg  Difficulty in walking, not elsewhere classified  Muscle weakness (generalized)      G-Codes - 28-Jan-2017 2029    Functional Assessment Tool Used (Outpatient Only) 5 times sit to stand, ABC   Functional Limitation Mobility: Walking and moving around   Mobility: Walking and Moving Around Current Status [phone]) At least 20 percent but less than 40 percent impaired, limited or restricted   Mobility: Walking and Moving Around Goal Status [phone]) At least 1 percent but less than 20 percent impaired, limited or restricted       Problem List Patient Active Problem List   Diagnosis Date Noted  . BPH (benign prostatic hyperplasia) 11/07/2016  . Hypertensive heart disease   . PAF (paroxysmal atrial fibrillation) ([place]) 07/22/2016  . Acute systolic congestive heart failure, NYHA class 4 ([place]) 07/22/2016  . Ventricular bigeminy 07/22/2016  . S/P CABG x 5 07/14/2016  . Unstable angina ([place]) 07/10/2016  . Effort angina ([facility])   . Basal  cell carcinoma in situ of skin of left shoulder 05/09/2016  . COPD mixed type ([place]) 02/25/2016  . Counseling regarding end of life decision making 12/22/2014  . Atrial flutter ([place]) 03/27/2014  . Neuropathy due to secondary diabetes mellitus ([place]) 04/14/2010  . Gout 12/13/2009  . Diabetes mellitus without complication ([place]) 28/00/3491  . Hyperlipemia 03/29/2009  . 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 GLAUCOMA 03/29/2009  . Essential hypertension, benign 03/29/2009  . Coronary atherosclerosis 03/29/2009  . ALLERGIC RHINITIS 03/29/2009  . ASTHMA, CHILDHOOD 03/29/2009  . OSA on CPAP 03/29/2009   Raylene Everts, PT, DPT Jan 28, 2017    8:30 PM    [facility] [address] [place], [place], [address] Phone: [phone]   Fax:  [phone]  Name: Tommy Sullivan MRN: [id number] Date of Birth: 10-01-1946

## 2017-01-22 NOTE — Telephone Encounter (Signed)
-----   Message from Ellamae Sia sent at 01/11/2017  3:07 PM EDT ----- Regarding: Lab orders for Monday, 4.23.18 Lab orders for a f/u DM appt

## 2017-01-23 ENCOUNTER — Encounter: Payer: Self-pay | Admitting: Family Medicine

## 2017-01-23 ENCOUNTER — Ambulatory Visit (INDEPENDENT_AMBULATORY_CARE_PROVIDER_SITE_OTHER): Payer: PPO | Admitting: Family Medicine

## 2017-01-23 VITALS — BP 100/74 | HR 46 | Temp 97.5°F | Ht 69.5 in | Wt 207.0 lb

## 2017-01-23 DIAGNOSIS — H93293 Other abnormal auditory perceptions, bilateral: Secondary | ICD-10-CM | POA: Diagnosis not present

## 2017-01-23 DIAGNOSIS — J301 Allergic rhinitis due to pollen: Secondary | ICD-10-CM | POA: Diagnosis not present

## 2017-01-23 DIAGNOSIS — E119 Type 2 diabetes mellitus without complications: Secondary | ICD-10-CM

## 2017-01-23 DIAGNOSIS — H903 Sensorineural hearing loss, bilateral: Secondary | ICD-10-CM

## 2017-01-23 NOTE — Progress Notes (Signed)
Pre visit review using our clinic review tool, if applicable. No additional management support is needed unless otherwise documented below in the visit note. 

## 2017-01-23 NOTE — Progress Notes (Signed)
   Subjective:    Patient ID: Tommy Sullivan, male    DOB: 11-May-1946, 71 y.o.   MRN: [id number]  HPI   71 year old male with complicated past medical history presents for DM follow up.  Diabetes:   Much improved with weight loss.  Using lantus 6-7 units  At bedtime. Decrease po intake overall. Lab Results  Component Value Date   HGBA1C 6.5 01/22/2017  Using medications without difficulties: Hypoglycemic episodes: frequently Hyperglycemic episodes: none Feet problems: no ulcers Blood Sugars averaging: FBS 78-90, 138 after meals  eye exam within last year:none  Body mass index is 30.13 kg/m. Wt Readings from Last 3 Encounters:  01/23/17 207 lb (93.9 kg)  01/10/17 208 lb (94.3 kg)  01/02/17 208 lb (94.3 kg)   In middle of ENT work up for super loud voice bilaterally in head.  Decreased appetite due to decreased taste. Has brain MRI to evaluate for stroke that might be causing hearing change.  Wife is still concerned it could be allergies.[facility] did not address this. He has hoarse voice since surgery, but worse today. Occ sneeze per wife, occ itchy eyes. No real congestion in nose. No post nasal drip. Laryngoscopy showed slight decrease in movement of  Review of Systems  Constitutional: Positive for fatigue.  HENT: Negative for ear pain.   Eyes: Negative for pain.  Respiratory: Positive for shortness of breath. Negative for wheezing.   Cardiovascular: Positive for leg swelling. Negative for chest pain and palpitations.       Objective:   Physical Exam  Constitutional: Vital signs are normal. He appears well-developed and well-nourished.  Pale fatigued appearing in NAD  HENT:  Head: Normocephalic.  Right Ear: Hearing normal.  Left Ear: Hearing normal.  Nose: Nose normal.  Mouth/Throat: Oropharynx is clear and moist and mucous membranes are normal.  Neck: Trachea normal. Carotid bruit is not present. No thyroid mass and no thyromegaly present.    Cardiovascular: Normal rate, regular rhythm and normal pulses.  Exam reveals no gallop, no distant heart sounds and no friction rub.   No murmur heard. 1 plus peripheral edema bilaterally  Pulmonary/Chest: Effort normal and breath sounds normal. No respiratory distress.  Skin: Skin is warm, dry and intact. No rash noted.  Psychiatric: He has a normal mood and affect. His speech is normal and behavior is normal. Thought content normal.          Assessment & Plan:

## 2017-01-23 NOTE — Patient Instructions (Addendum)
Decrease insulin to 5 units once daily.  Goal FBS 90-120, 2 hour after meals , 180.  Call if above these goals regularly.  If still havign lows. < 70.. Call ASAP.  Get glucose tablets. If you want to try zyrtec and flonase for possible allergy symptoms you can.

## 2017-01-24 ENCOUNTER — Encounter: Payer: PPO | Admitting: Cardiothoracic Surgery

## 2017-01-24 ENCOUNTER — Encounter: Payer: Self-pay | Admitting: Physical Therapy

## 2017-01-24 ENCOUNTER — Ambulatory Visit: Payer: PPO | Admitting: Physical Therapy

## 2017-01-24 ENCOUNTER — Telehealth: Payer: Self-pay | Admitting: Physical Therapy

## 2017-01-24 DIAGNOSIS — M6281 Muscle weakness (generalized): Secondary | ICD-10-CM

## 2017-01-24 DIAGNOSIS — R262 Difficulty in walking, not elsewhere classified: Secondary | ICD-10-CM

## 2017-01-24 DIAGNOSIS — M79604 Pain in right leg: Secondary | ICD-10-CM

## 2017-01-24 NOTE — Therapy (Signed)
[facility] [address] [place], [place], [address] Phone: [phone]   Fax:  [phone]  Physical Therapy Treatment  Patient Details  Name: Tommy Sullivan MRN: [id number] Date of Birth: 10-26-1945 Referring Provider: Jolaine Artist, MD  Encounter Date: 01/24/2017      PT End of Session - 01/24/17 1218    Visit Number 2   Number of Visits 13   Date for PT Re-Evaluation 03/08/17   Authorization Type [employer] $20 co-pay   PT Start Time 1015   PT Stop Time 1107   PT Time Calculation (min) 52 min   Activity Tolerance Patient tolerated treatment well   Behavior During Therapy [facility] for tasks assessed/performed      Past Medical History:  Diagnosis Date  . Allergy   . Arthritis   . Atrial flutter ([place])    a/ 05/2014 s/p RFCA.  Marland Kitchen CAD (coronary artery disease) 10/2007   a. 10/2007 s/p MI/PCI of RCA [facility] - Dr. Collene Mares);  b. 06/2016 MV: EF 34%, inf/inflat infarct; c. 07/2016 Cath: LM nl, LAd 90ost/prox, 56m, 99d, D2 30, RI small, LCX 131m, Om1 40, RCA 50ost, 5 ISR, 15m, 30d, RPDA  80ost, RPL2 70, EF 45-50%;  d. 07/2016 CABG x 5 (LIMA->LAD, VG->Diag, VG->RI, VG->PDA->RPL).  . Chronic systolic CHF (congestive heart failure) ([place])    a. 07/2016 Echo: Ef 30-35%.  . Diabetes mellitus without complication ([place])   . Extrinsic asthma, unspecified   . Glaucoma   . Gout   . Hyperlipidemia   . Hypertensive heart disease   . Ischemic cardiomyopathy    a. 07/2016 Echo: Ef 30-35%, mildly dil LA, mild MR/TR, PASP 24 mmHg.  Marland Kitchen Post-op Afib following CABG (07/2016)    a. 07/2016 - placed on amio post-op.  Marland Kitchen Retinal vascular occlusion, unspecified    Right  . Sleep apnea     Past Surgical History:  Procedure Laterality Date  . ABLATION  05-07-2014   EPS and ablation of atrial flutter by Dr Caryl Comes  . APPENDECTOMY  1954  . ATRIAL FLUTTER ABLATION N/A 05/07/2014   Procedure: ATRIAL FLUTTER ABLATION;  Surgeon: Deboraha Sprang, MD;  Location: [facility] CATH LAB;  Service: Cardiovascular;  Laterality: N/A;  . CARDIAC CATHETERIZATION Left 07/10/2016   Procedure: Left Heart Cath and Coronary Angiography;  Surgeon: Wellington Hampshire, MD;  Location: [facility] CV LAB;  Service: Cardiovascular;  Laterality: Left;  . COLONOSCOPY    . CORONARY ANGIOPLASTY  2009   stent [place]  . CORONARY ARTERY BYPASS GRAFT N/A 07/14/2016   Procedure: CORONARY ARTERY BYPASS GRAFTING (CABG), ON PUMP, TIMES FIVE, USING LEFT INTERNAL MAMMARY ARTERY, RIGHT GREATER SAPHENOUS VEIN HARVESTED ENDOSCOPICALLY;  Surgeon: Ivin Poot, MD;  Location: [facility];  Service: Open Heart Surgery;  Laterality: N/A;  SVG to PD and PL sequential SVG to 1st diagonal SVG to [place] LIMA to LAD  . NECK SURGERY  1990's   x2, Fusion  . SHOULDER ARTHROSCOPY WITH SUBACROMIAL DECOMPRESSION Left 04/02/2014   Procedure: SHOULDER ARTHROSCOPY WITH SUBACROMIAL DECOMPRESSION WITH ROTATOR CUFF REPAIR;  Surgeon: Nita Sells, MD;  Location: [facility];  Service: Orthopedics;  Laterality: Left;  Left shoulder rotator cuff repair, subacromial decompression  . SKIN SURGERY    . TEE WITHOUT CARDIOVERSION N/A 07/14/2016   Procedure: TRANSESOPHAGEAL ECHOCARDIOGRAM (TEE);  Surgeon: Ivin Poot, MD;  Location: [facility];  Service: Open Heart Surgery;  Laterality: N/A;  . TONSILLECTOMY  There were no vitals filed for this visit.      Subjective Assessment - 01/24/17 1031    Subjective Pt returns for further assessment of leg pain; reports leaving the eval on Monday and spending one hour on bike but only went 5 miles.  No issues to report today   Patient is accompained by: Family member   Pertinent History Monitor vitals!  Significant cardiac history: MI, CAD with CABG x 5, CHF, AFIB, HTN, DM, asthma, gout, ischemic cardiomyopathy, OSA, cervical fusion, L RTC repair   Limitations Walking   Patient Stated Goals To find out where the pain is  coming from and ease the pain   Currently in Pain? Yes   Pain Score 5    Pain Location Leg   Pain Orientation Right;Lateral   Pain Descriptors / Indicators Sharp   Pain Type Chronic pain            [facility] PT Assessment - 01/24/17 1040      Observation/Other Assessments   Observations RLE: red in lower leg until elevated-skin color changes to pale and then back to bright red in dependent position   Skin Integrity RLE is shiny, the skin is taut relative to the LLE, and is darker red in color relative to LLE. Both RLE and LLE have decreased hair growth and swelling. Stockings donned on BLEs.        Observation/Other Assessments-Edema    Edema --  present in bilat LE     6 Minute Walk- Baseline   6 Minute Walk- Baseline yes   BP (mmHg) 90/70   HR (bpm) 64   02 Sat (%RA) 98 %   Modified Borg Scale for Dyspnea 0.5- Very, very slight shortness of breath   Perceived Rate of Exertion (Borg) 7- Very, very light     6 minute walk test results    Endurance additional comments unable to peform 6 min test today due to extensive time spend on education and difficulty obtaining vitals with equipment; will attempt next session                     [facility] Adult PT Treatment/Exercise - 01/24/17 1214      Therapeutic Activites    Therapeutic Activities Other Therapeutic Activities   Other Therapeutic Activities Extensive education on donning of bilat knee high TEDs to improve efficiency and change of wear schedule to evening shower and donning first thing in AM (instead of waiting until after shower and breakfast) to improve edema management and efficiency of donning; also educated pt on taking break mid-day and lying supine with LE elevated for further edema management.  Pt also provided education on intermittent claudication and how to monitor pain symptoms as well as gradually increasing distance if symptoms remain mild (2-3) and ceasing when symptoms more severe 6-7/10 and not  letting symptoms reach 10/10 and completely unable to ambulate.                   PT Education - 01/24/17 1218    Education provided Yes   Education Details TEDs, donning and wear schedule, claudication and symptom monitoring during activity   Person(s) Educated Patient   Methods Explanation;Demonstration;Handout   Comprehension Need further instruction          PT Short Term Goals - 01/22/17 2019      PT SHORT TERM GOAL #1   Title (TARGET DATE FOR STG IS 02/13/17) Pt will participate in 6 min walk  test of endurance with LTG to be set   Time 3   Period Weeks   Status New     PT SHORT TERM GOAL #2   Title Pt will improve ITB and hip flexor ROM to <15 deg limitation   Baseline lacking 24 deg to full hip extension; ITB TBA   Time 3   Period Weeks   Status New     PT SHORT TERM GOAL #3   Title Pt will improve LE strength as indicated by a 5 time sit to stand time of <12 seconds   Baseline 14.7 seconds with R quad pain   Time 3   Period Weeks   Status New     PT SHORT TERM GOAL #4   Title Pt will negotiate up/down 12 stairs with alternating sequence and one rails Mod I and report <3/10 pain in RLE   Baseline 5/10 tightness in RLE   Time 3   Period Weeks   Status New           PT Long Term Goals - 01/22/17 2022      PT LONG TERM GOAL #1   Title (TARGET DATE FOR ALL LTG IS 03/08/2017) Pt will be independent with LE strength and stretching HEP   Time 6   Period Weeks   Status New     PT LONG TERM GOAL #2   Title 6 minute walk test goal TBD     PT LONG TERM GOAL #3   Title Pt will report improved confidence with functional balance as indicated by ABC score of >90%   Baseline 81.9%   Time 6   Period Weeks   Status New     PT LONG TERM GOAL #4   Title Pt will negotiate 12 stairs alternating sequence without UE support at MOD I and report RLE pain as <2/10   Time 6   Period Weeks   Status New     PT LONG TERM GOAL #5   Title Pt will perform gait x  500' outside over uneven terrain/curb/sidewalk without AD and MOD I with 2/4 DOE and RLE pain <2/10   Time 6   Period Weeks   Status New               Plan - 01/24/17 1218    Clinical Impression Statement Treatment session today focused on futher assessment of RLE pain and blood flow.  Also provided pt with extensive education on intermittent claudication symptoms, proper donning and wear schedule of KHT and symptom self monitoring during exercise.  Due to extensive education and time spent attempting to obtain pre test vitals unable to perform 6 min walk test today; will assess next visit and note pt's pain symptoms and time to onset of pain.  Pt's pain and symptoms appear to be more consistent with vascular impairments vs. neuropathy; will continue to assess and discuss with MD.   Clinical Impairments Affecting Rehab Potential significant cardiac history   PT Treatment/Interventions ADLs/Self Care Home Management;Moist Heat;Ultrasound;Gait training;Stair training;Functional mobility training;Therapeutic activities;Therapeutic exercise;Balance training;Neuromuscular re-education;Patient/family education;Passive range of motion;Scar mobilization;Energy conservation;Taping   PT Next Visit Plan pt appears to have intermittent claudication-perform 6 min walk test (warm up hands prior to using pulse ox and use manual BP cuff on RUE for vitals pre and post)-walk as long as symptoms remain <6/10; if pain/cramping in RLE >6/10 then stop, sit and rest before resuming.  Please set 6 min walk test LTG or CC chart to  me.     Consulted and Agree with Plan of Care Patient      Patient will benefit from skilled therapeutic intervention in order to improve the following deficits and impairments:  Cardiopulmonary status limiting activity, Decreased activity tolerance, Decreased endurance, Decreased strength, Difficulty walking, Impaired flexibility, Pain, Decreased range of motion, Impaired sensation  Visit  Diagnosis: Pain in right leg  Difficulty in walking, not elsewhere classified  Muscle weakness (generalized)     Problem List Patient Active Problem List   Diagnosis Date Noted  . Bilateral sensorineural hearing loss 01/23/2017  . Abnormal auditory perception of both ears 01/23/2017  . BPH (benign prostatic hyperplasia) 11/07/2016  . Hypertensive heart disease   . PAF (paroxysmal atrial fibrillation) ([place]) 07/22/2016  . Acute systolic congestive heart failure, NYHA class 4 ([place]) 07/22/2016  . Ventricular bigeminy 07/22/2016  . S/P CABG x 5 07/14/2016  . Unstable angina ([place]) 07/10/2016  . Effort angina ([facility])   . Basal cell carcinoma in situ of skin of left shoulder 05/09/2016  . COPD mixed type ([place]) 02/25/2016  . Counseling regarding end of life decision making 12/22/2014  . Atrial flutter ([place]) 03/27/2014  . Neuropathy due to secondary diabetes mellitus ([place]) 04/14/2010  . Gout 12/13/2009  . Diabetes mellitus without complication ([place]) 17/49/4496  . Hyperlipemia 03/29/2009  . 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 GLAUCOMA 03/29/2009  . Essential hypertension, benign 03/29/2009  . Coronary atherosclerosis 03/29/2009  . ALLERGIC RHINITIS 03/29/2009  . ASTHMA, CHILDHOOD 03/29/2009  . OSA on CPAP 03/29/2009   Raylene Everts, PT, DPT 01/24/17    12:25 PM    [facility] [address] [place], [place], [address] Phone: [phone]   Fax:  [phone]  Name: MORGEN RITACCO MRN: [id number] Date of Birth: [birth year]

## 2017-01-24 NOTE — Patient Instructions (Signed)
Intermittent Claudication Intermittent claudication is pain in your leg that occurs when you walk or exercise and goes away when you rest. The pain can occur in one or both legs. What are the causes? Intermittent claudication is caused by the buildup of plaque within the major arteries in the body (atherosclerosis). The plaque, which makes arteries stiff and narrow, prevents enough blood from reaching your leg muscles. The pain occurs when you walk or exercise because your muscles need more blood when you are moving and exercising. What increases the risk? Risk factors include:  A family history of atherosclerosis.  A personal history of stroke or heart disease.  Older age.  Being inactive or overweight.  Smoking cigarettes.  Having another health condition such as:  Diabetes.  High blood pressure.  High cholesterol. What are the signs or symptoms? Your hip or leg may:  Ache.  Cramp.  Feel tight.  Feel weak.  Feel heavy. Over time, you may feel pain in your calf, thigh, or hip. How is this diagnosed? Your health care provider may diagnose intermittent claudication based on your symptoms and medical history. Your health care provider may also do tests to learn more about your condition. These may include:  Blood tests.  An ultrasound.  Imaging tests such as angiography, magnetic resonance angiography (MRA), and computed tomography angiography (CTA). How is this treated? You may be treated for problems such as:  High blood pressure.  High cholesterol.  Diabetes. Other treatments may include:  Lifestyle changes such as:  Starting an exercise program.  Losing weight.  Quitting smoking.  Medicines to help restore blood flow through your legs.  Blood vessel surgery (angioplasty) to restore blood flow if your intermittent claudication is caused by severe peripheral artery disease. Follow these instructions at home:  Manage any other health conditions you  have.  Eat a diet low in saturated fats and calories to maintain a healthy weight.  Quit smoking, if you smoke.  Take medicines only as directed by your health care provider.  If your health care provider recommended an exercise program for you, follow it as directed. Your exercise program may involve:  Walking three or more times a week.  Walking until you have certain symptoms of intermittent claudication.  Resting until symptoms go away.  Gradually increasing walking time to about 50 minutes a day. Contact a health care provider if: Your condition is not getting better or is getting worse. Get help right away if:  You have chest pain.  You have difficulty breathing.  You develop arm weakness.  You have trouble speaking.  Your face begins to droop. This information is not intended to replace advice given to you by your health care provider. Make sure you discuss any questions you have with your health care provider. Document Released: 07/21/2004 Document Revised: 02/24/2016 Document Reviewed: 12/25/2013 Elsevier Interactive Patient Education  2017 [employer].    Peripheral Vascular Disease Peripheral vascular disease (PVD) is a disease of the blood vessels that are not part of your heart and brain. A simple term for PVD is poor circulation. In most cases, PVD narrows the blood vessels that carry blood from your heart to the rest of your body. This can result in a decreased supply of blood to your arms, legs, and internal organs, like your stomach or kidneys. However, it most often affects a person's lower legs and feet. There are two types of PVD.  Organic PVD. This is the more common type. It is caused  by damage to the structure of blood vessels.  Functional PVD. This is caused by conditions that make blood vessels contract and tighten (spasm). Without treatment, PVD tends to get worse over time. PVD can also lead to acute ischemic limb. This is when an arm or limb  suddenly has trouble getting enough blood. This is a medical emergency. Follow these instructions at home:  Take medicines only as told by your doctor.  Do not use any tobacco products, including cigarettes, chewing tobacco, or electronic cigarettes. If you need help quitting, ask your doctor.  Lose weight if you are overweight, and maintain a healthy weight as told by your doctor.  Eat a diet that is low in fat and cholesterol. If you need help, ask your doctor.  Exercise regularly. Ask your doctor for some good activities for you.  Take good care of your feet.  Wear comfortable shoes that fit well.  Check your feet often for any cuts or sores. Contact a doctor if:  You have cramps in your legs while walking.  You have leg pain when you are at rest.  You have coldness in a leg or foot.  Your skin changes.  You are unable to get or have an erection (erectile dysfunction).  You have cuts or sores on your feet that are not healing. Get help right away if:  Your arm or leg turns cold and blue.  Your arms or legs become red, warm, swollen, painful, or numb.  You have chest pain or trouble breathing.  You suddenly have weakness in your face, arm, or leg.  You become very confused or you cannot speak.  You suddenly have a very bad headache.  You suddenly cannot see. This information is not intended to replace advice given to you by your health care provider. Make sure you discuss any questions you have with your health care provider. Document Released: 12/13/2009 Document Revised: 02/24/2016 Document Reviewed: 02/26/2014 Elsevier Interactive Patient Education  2017 [employer].

## 2017-01-24 NOTE — Telephone Encounter (Signed)
Hello Dr. Haroldine Laws, I recently evaluated Tommy Sullivan for RLE pain and neuropathy (per the referral diagnosis).  After completing the evaluation I feel that his symptoms are more vascular in nature.  I saw where he had a vascular flow study in January and it looked like it was inconclusive due to his inability to tolerate the exercise portion (if I am interpreting it correctly).  Since he has completed cardiac rehab and is participating in outpatient do you think it would be worth repeating the vascular flow study now that he may be better able to tolerate the exercise portion?    Also he mentioned he has been having some dizziness; we also can perform a dizziness evaluation at this clinic but we would need a referral for vestibular evaluation and treatment in order to include it in this episode of care.  If you feel this would be beneficial for [place] to include in our treatment plan (after the results of his MRI) he would need an order entered in Epic for vestibular evaluation and treat.    Thank you for any insight or guidance you can provide, Raylene Everts, PT, DPT 01/24/17    12:43 PM

## 2017-01-25 ENCOUNTER — Telehealth: Payer: Self-pay | Admitting: Internal Medicine

## 2017-01-25 NOTE — Telephone Encounter (Signed)
LMTCB

## 2017-01-27 NOTE — Telephone Encounter (Signed)
Tommy Sullivan,  Thanks for the note. I reviewed his vascular studies and based on the results, I suspect that he does not have critical PAD but if you feel he is having clear claudication we can reorder.   We would be happy to place an order for vestibular eval. Thanks again. -dan  Nira Conn - can you help [place] with that order? Thanks -dan

## 2017-01-29 MED ORDER — MODAFINIL 200 MG PO TABS: 200.0000 mg | ORAL_TABLET | Freq: Every day | ORAL | 3 refills | Status: AC

## 2017-01-29 NOTE — Telephone Encounter (Signed)
Called [facility] and refilled Rx for Provigil 200mg  #90 with 3 refills.  Pt aware. Nothing further needed.

## 2017-01-29 NOTE — Telephone Encounter (Signed)
Yes, ok to refill as before

## 2017-01-29 NOTE — Telephone Encounter (Signed)
Dr. Annamaria Boots  Please Advise-  Pt called and stated when the PCCs called to schedule him for his HST he wanted to wait till June before doing it. He is now concerned because his rx for his provigil will be up in May and he did not know if he could have an rx to help extend him until he has his home sleep test in June.   Modafinil 200mg  Last filled: 11/09/16 Quantity#90 RF# 3 Sig: Take 1 tablet (200 mg total) by mouth daily.

## 2017-01-29 NOTE — Telephone Encounter (Signed)
lmomtcb x 2  

## 2017-01-29 NOTE — Telephone Encounter (Signed)
Patient returned phone call and states [facility], [place], [place].Marland KitchenMarland Kitchen

## 2017-01-29 NOTE — Telephone Encounter (Signed)
Pharmacy never verified  [facility]

## 2017-01-29 NOTE — Telephone Encounter (Signed)
Pt returning call and can be reached @ [phone].Tommy Sullivan

## 2017-01-30 ENCOUNTER — Ambulatory Visit
Admission: RE | Admit: 2017-01-30 | Discharge: 2017-01-30 | Disposition: A | Discharge status: Home / Self Care | Payer: PPO | Source: Ambulatory Visit | Attending: Otolaryngology | Admitting: Otolaryngology

## 2017-01-30 DIAGNOSIS — H9319 Tinnitus, unspecified ear: Secondary | ICD-10-CM | POA: Insufficient documentation

## 2017-01-30 DIAGNOSIS — M2548 Effusion, other site: Secondary | ICD-10-CM | POA: Diagnosis not present

## 2017-01-30 DIAGNOSIS — R42 Dizziness and giddiness: Secondary | ICD-10-CM | POA: Diagnosis not present

## 2017-01-30 DIAGNOSIS — H918X9 Other specified hearing loss, unspecified ear: Secondary | ICD-10-CM | POA: Diagnosis not present

## 2017-01-30 LAB — POCT I-STAT CREATININE: CREATININE: 1.7 mg/dL — AB (ref 0.61–1.24)

## 2017-01-30 MED ORDER — GADOBENATE DIMEGLUMINE 529 MG/ML IV SOLN
19.0000 mL | Freq: Once | INTRAVENOUS | Status: AC | PRN
  Administered 2017-01-30: 10 mL via INTRAVENOUS

## 2017-01-31 ENCOUNTER — Ambulatory Visit: Payer: PPO | Attending: Internal Medicine | Admitting: Physical Therapy

## 2017-01-31 ENCOUNTER — Encounter: Payer: Self-pay | Admitting: Cardiothoracic Surgery

## 2017-01-31 ENCOUNTER — Encounter: Payer: Self-pay | Admitting: Physical Therapy

## 2017-01-31 ENCOUNTER — Ambulatory Visit (INDEPENDENT_AMBULATORY_CARE_PROVIDER_SITE_OTHER): Payer: PPO | Admitting: Cardiothoracic Surgery

## 2017-01-31 VITALS — BP 78/42 | HR 56 | Resp 16 | Ht 69.5 in | Wt 207.0 lb

## 2017-01-31 VITALS — BP 80/60 | HR 57

## 2017-01-31 DIAGNOSIS — M6281 Muscle weakness (generalized): Secondary | ICD-10-CM

## 2017-01-31 DIAGNOSIS — R262 Difficulty in walking, not elsewhere classified: Secondary | ICD-10-CM | POA: Diagnosis not present

## 2017-01-31 DIAGNOSIS — Z951 Presence of aortocoronary bypass graft: Secondary | ICD-10-CM

## 2017-01-31 DIAGNOSIS — I2510 Atherosclerotic heart disease of native coronary artery without angina pectoris: Secondary | ICD-10-CM

## 2017-01-31 DIAGNOSIS — M79604 Pain in right leg: Secondary | ICD-10-CM

## 2017-01-31 NOTE — Patient Instructions (Signed)
Hip Flexor Stretch    Lying on back near edge of bed, bend left leg, foot flat. Hang right leg over edge, relaxed, thigh resting entirely on bed for _1-2___ minutes. Repeat __2__ times. Do _2___ sessions per day. Advanced Exercise: Bend knee back keeping thigh in contact with bed.  http://gt2.exer.us/346   Copyright  VHI. All rights reserved.

## 2017-01-31 NOTE — Progress Notes (Signed)
PCP is Eliezer Lofts, MD Referring Provider is Wellington Hampshire, MD  Chief Complaint  Patient presents with  . Routine Post Op    3 month f/u s/p CABG 07/14/16 after heart failure clinic eval    HPI: Patient returns for follow-up 6 months after multivessel CABG for diabetic ischemic cardiomyopathy ejection fraction of 45% by last echo. Patient also has probable diastolic heart failure with peripheral edema and moderate TR. He has been evaluated at the advanced heart failure clinic and his medications are being titrated. He recently completed a Holter monitor which showed significant amount of ectopy but only a brief run of atrial flutter. He recently had a brain MRI which showed no previous stroke, adequate vasculature with possible right mastoid sinusitis.  The patient has multiple complaints including dry mouth, pain with proximal weakness   in his right thigh, persistent tibial edema, shortness of breath with exertion, lightheadedness.  His last chest x-ray is clear. His surgical incisions are all well-healed.  Past Medical History:  Diagnosis Date  . Allergy   . Arthritis   . Atrial flutter ([place])    a/ 05/2014 s/p RFCA.  Marland Kitchen CAD (coronary artery disease) 10/2007   a. 10/2007 s/p MI/PCI of RCA [facility] - Dr. Collene Mares);  b. 06/2016 MV: EF 34%, inf/inflat infarct; c. 07/2016 Cath: LM nl, LAd 90ost/prox, 76m 99d, D2 30, RI small, LCX 104mOm1 40, RCA 50ost, 5 ISR, 9076m0d, RPDA  80ost, RPL2 70, EF 45-50%;  d. 07/2016 CABG x 5 (LIMA->LAD, VG->Diag, VG->RI, VG->PDA->RPL).  . Chronic systolic CHF (congestive heart failure) ([place]  a. 07/2016 Echo: Ef 30-35%.  . Diabetes mellitus without complication ([facility] . Extrinsic asthma, unspecified   . Glaucoma   . Gout   . Hyperlipidemia   . Hypertensive heart disease   . Ischemic cardiomyopathy    a. 07/2016 Echo: Ef 30-35%, mildly dil LA, mild MR/TR, PASP 24 mmHg.  . 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 07/2016 - placed on amio post-op.   . RMarland Kitchentinal vascular occlusion, unspecified    Right  . Sleep apnea     Past Surgical History:  Procedure Laterality Date  . ABLATION  05-07-2014   EPS and ablation of atrial flutter by Dr KleCaryl Comes APPENDECTOMY  1954  . ATRIAL FLUTTER ABLATION N/A 05/07/2014   Procedure: ATRIAL FLUTTER ABLATION;  Surgeon: SteDeboraha SprangD;  [facility];  Service: Cardiovascular;  Laterality: N/A;  . CARDIAC CATHETERIZATION Left 07/10/2016   Procedure: Left Heart Cath and Coronary Angiography;  Surgeon: MuhWellington HampshireD;  [facility];  Service: Cardiovascular;  Laterality: Left;  . COLONOSCOPY    . CORONARY ANGIOPLASTY  2009   [place] CORONARY ARTERY BYPASS GRAFT N/A 07/14/2016   Procedure: CORONARY ARTERY BYPASS GRAFTING (CABG), ON PUMP, TIMES FIVE, USING LEFT INTERNAL MAMMARY ARTERY, RIGHT GREATER SAPHENOUS VEIN HARVESTED ENDOSCOPICALLY;  Surgeon: PetIvin PootD;  [facility];  Laterality: N/A;  SVG to PD and PL sequential SVG to 1st diagonal SVG to Ramus LIMA to LAD  . NECK SURGERY  1990's   x2, Fusion  . SHOULDER ARTHROSCOPY WITH SUBACROMIAL DECOMPRESSION Left 04/02/2014   Procedure: SHOULDER ARTHROSCOPY WITH SUBACROMIAL DECOMPRESSION WITH ROTATOR CUFF REPAIR;  Surgeon: JusNita SellsD;  [facility];  Laterality: Left;  Left shoulder rotator cuff repair, subacromial decompression  .  SKIN SURGERY    . TEE WITHOUT CARDIOVERSION N/A 07/14/2016   Procedure: TRANSESOPHAGEAL ECHOCARDIOGRAM (TEE);  Surgeon: Ivin Poot, MD;  Location: [place];  Service: Open Heart Surgery;  Laterality: N/A;  . TONSILLECTOMY      Family History  Problem Relation Age of Onset  . Diabetes Sister   . Coronary artery disease Brother   . AAA (abdominal aortic aneurysm) Father   . Heart disease Mother   . Diabetes Mother   . Diabetes Brother   . Colon cancer Neg Hx      Social History Social History  Substance Use Topics  . Smoking status: Former Smoker    Packs/day: 1.00    Years: 20.00    Types: Cigarettes    Quit date: 10/02/1988  . Smokeless tobacco: Never Used     Comment: quit in the 1990's  . Alcohol use 1.8 oz/week    3 Cans of beer per week     Comment: 3-4 drinks per week.     Current Outpatient Prescriptions  Medication Sig Dispense Refill  . aspirin EC 325 MG EC tablet Take 1 tablet (325 mg total) by mouth daily. 30 tablet 0  . Blood Glucose Monitoring Suppl (ONETOUCH VERIO IQ SYSTEM) w/Device KIT Check blood sugar twice a day and as directed. Dx E11.9 1 kit 0  . Cholecalciferol (VITAMIN D3) 1000 units CAPS Take 1,000 Units by mouth daily.     . fenofibrate 160 MG tablet Take 1 tablet (160 mg total) by mouth daily. 90 tablet 3  . glucose blood (ONETOUCH VERIO) test strip Check blood sugar twice a day and as directed. Dx E11.9 100 each 5  . Insulin Glargine (LANTUS SOLOSTAR) 100 UNIT/ML Solostar Pen Inject 5 Units into the skin 2 (two) times daily.     . Insulin Pen Needle (BD PEN NEEDLE NANO U/F) 32G X 4 MM MISC Use to inject lantus twice a day and as instructed. Dx E11.9 100 each 6  . latanoprost (XALATAN) 0.005 % ophthalmic solution Place 1 drop into the left eye at bedtime.   3  . metoprolol succinate (TOPROL-XL) 25 MG 24 hr tablet Take 1 tablet (25 mg total) by mouth at bedtime. 30 tablet 3  . modafinil (PROVIGIL) 200 MG tablet Take 1 tablet (200 mg total) by mouth daily. 90 tablet 3  . Multiple Vitamins-Minerals (MULTIVITAMIN MEN PO) Take 1 tablet by mouth daily.     . Omega-3 Fatty Acids (FISH OIL) 1000 MG CAPS Take 2,000 capsules by mouth 2 (two) times daily.     . ONE TOUCH LANCETS MISC Check blood sugar twice a day and as directed. Dx E11.9 100 each 5  . potassium chloride 20 MEQ TBCR Take 20 mEq by mouth daily. 30 tablet 3  . spironolactone (ALDACTONE) 25 MG tablet Take 1 tablet (25 mg total) by mouth daily. 90 tablet 3  .  torsemide (DEMADEX) 20 MG tablet Take 2 tabs Twice daily ON Monday AND Friday, take 2 tabs in AM and 1 tab in PM all other days 120 tablet 3  . rosuvastatin (CRESTOR) 10 MG tablet Take 10 mg by mouth every night 90 tablet 3   No current facility-administered medications for this visit.     Allergies  Allergen Reactions  . No Known Allergies     Review of Systems  Weight is up despite more diuretics  Appetite and taste are slowly improving Patient trying to follow cardiac rehabilitation program at home with riding bicycle  and treadmill Patient is seeing a physical therapist for the right thigh weakness and neuritic-type pain  BP (!) 78/42 (BP Location: Right Arm, Patient Position: Sitting, Cuff Size: Large)   Pulse (!) 56   Resp 16   Ht 5' 9.5" (1.765 m)   Wt 207 lb (93.9 kg)   SpO2 96% Comment: ON RA  BMI 30.13 kg/m  Physical Exam      Exam    General- alert and comfortable, patient appears somewhat dehydrated with dry mouth and decreased skin turgor   Lungs- clear without rales, wheezes   Cor- irregular rhythm with probable multiple PVCs, no murmur , gallop   Abdomen- soft, non-tender   Extremities - warm, non-tender, tibial  Edema with TED hose in place   Neuro- oriented, appropriate, no focal weakness   Diagnostic Tests: Recent MRI results reviewed with patient  Impression: 6 months status post CABG His main cardiac issues now are medical and related to titration of medicines for his heart failure symptoms. I have endorsed strongly that he follow Dr. Gladstone Lighter recommendations and plan  Plan: Return for review in 6 months  Len Childs, MD [facility] [phone]

## 2017-01-31 NOTE — Therapy (Addendum)
[facility] [address] [place], [place], [address] Phone: [phone]   Fax:  [phone]  Physical Therapy Treatment  Patient Details  Name: Tommy Sullivan MRN: [id number] Date of Birth: 11-27-1945 Referring Provider: Jolaine Artist, MD  Encounter Date: 01/31/2017      PT End of Session - 01/31/17 2133    Visit Number 3   Number of Visits 13   Date for PT Re-Evaluation 03/08/17   Authorization Type [employer] $20 co-pay   PT Start Time 1230   PT Stop Time 1320   PT Time Calculation (min) 50 min   Activity Tolerance Patient tolerated treatment well   Behavior During Therapy [facility] for tasks assessed/performed      Past Medical History:  Diagnosis Date  . Allergy   . Arthritis   . Atrial flutter ([place])    a/ 05/2014 s/p RFCA.  Marland Kitchen CAD (coronary artery disease) 10/2007   a. 10/2007 s/p MI/PCI of RCA [facility] - Dr. Collene Mares);  b. 06/2016 MV: EF 34%, inf/inflat infarct; c. 07/2016 Cath: LM nl, LAd 90ost/prox, 17m, 99d, D2 30, RI small, LCX 178m, Om1 40, RCA 50ost, 5 ISR, 22m, 30d, RPDA  80ost, RPL2 70, EF 45-50%;  d. 07/2016 CABG x 5 (LIMA->LAD, VG->Diag, VG->RI, VG->PDA->RPL).  . Chronic systolic CHF (congestive heart failure) ([place])    a. 07/2016 Echo: Ef 30-35%.  . Diabetes mellitus without complication ([place])   . Extrinsic asthma, unspecified   . Glaucoma   . Gout   . Hyperlipidemia   . Hypertensive heart disease   . Ischemic cardiomyopathy    a. 07/2016 Echo: Ef 30-35%, mildly dil LA, mild MR/TR, PASP 24 mmHg.  Marland Kitchen Post-op Afib following CABG (07/2016)    a. 07/2016 - placed on amio post-op.  Marland Kitchen Retinal vascular occlusion, unspecified    Right  . Sleep apnea     Past Surgical History:  Procedure Laterality Date  . ABLATION  05-07-2014   EPS and ablation of atrial flutter by Dr Caryl Comes  . APPENDECTOMY  1954  . ATRIAL FLUTTER ABLATION N/A 05/07/2014   Procedure: ATRIAL FLUTTER ABLATION;  Surgeon: Deboraha Sprang, MD;  Location: [facility] CATH LAB;  Service: Cardiovascular;  Laterality: N/A;  . CARDIAC CATHETERIZATION Left 07/10/2016   Procedure: Left Heart Cath and Coronary Angiography;  Surgeon: Wellington Hampshire, MD;  Location: [facility] CV LAB;  Service: Cardiovascular;  Laterality: Left;  . COLONOSCOPY    . CORONARY ANGIOPLASTY  2009   stent [place]  . CORONARY ARTERY BYPASS GRAFT N/A 07/14/2016   Procedure: CORONARY ARTERY BYPASS GRAFTING (CABG), ON PUMP, TIMES FIVE, USING LEFT INTERNAL MAMMARY ARTERY, RIGHT GREATER SAPHENOUS VEIN HARVESTED ENDOSCOPICALLY;  Surgeon: Ivin Poot, MD;  Location: [place];  Service: Open Heart Surgery;  Laterality: N/A;  SVG to PD and PL sequential SVG to 1st diagonal SVG to Ramus LIMA to LAD  . NECK SURGERY  1990's   x2, Fusion  . SHOULDER ARTHROSCOPY WITH SUBACROMIAL DECOMPRESSION Left 04/02/2014   Procedure: SHOULDER ARTHROSCOPY WITH SUBACROMIAL DECOMPRESSION WITH ROTATOR CUFF REPAIR;  Surgeon: Nita Sells, MD;  Location: [facility];  Service: Orthopedics;  Laterality: Left;  Left shoulder rotator cuff repair, subacromial decompression  . SKIN SURGERY    . TEE WITHOUT CARDIOVERSION N/A 07/14/2016   Procedure: TRANSESOPHAGEAL ECHOCARDIOGRAM (TEE);  Surgeon: Ivin Poot, MD;  Location: [facility];  Service: Open Heart Surgery;  Laterality: N/A;  . TONSILLECTOMY  Vitals:   01/31/17 1243  BP: (!) 80/60  Pulse: (!) 57  SpO2: 98%        Subjective Assessment - 01/31/17 1245    Subjective Pt had MRI, unremarkable except for possible R mastoid sinusitis.  Pt just finished visit with cardiology; adjusting medication to assist with BP and edema.   Patient is accompained by: Family member   Pertinent History Monitor vitals!  Significant cardiac history: MI, CAD with CABG x 5, CHF, AFIB, HTN, DM, asthma, gout, ischemic cardiomyopathy, OSA, cervical fusion, L RTC repair   Limitations Walking   Patient Stated Goals To find out  where the pain is coming from and ease the pain   Currently in Pain? No/denies            [facility] PT Assessment - 01/31/17 1300      6 Minute Walk- Baseline   6 Minute Walk- Baseline yes   BP (mmHg) (!)  80/60   HR (bpm) 57   02 Sat (%RA) 98 %   Modified Borg Scale for Dyspnea 0- Nothing at all   Perceived Rate of Exertion (Borg) 7- Very, very light     6 Minute walk- Post Test   6 Minute Walk Post Test yes   BP (mmHg) (!)  80/60   HR (bpm) 75   02 Sat (%RA) 97 %   Modified Borg Scale for Dyspnea 4- somewhat severe   Perceived Rate of Exertion (Borg) 14-     6 minute walk test results    Aerobic Endurance Distance Walked 403   Endurance additional comments Only able to complete 2:30 seconds; RLE pain began at 2 minutes-2/10, after 30 more seconds pain significantly increased to 7/10 and unable to continue.  Reports pain in RLE as cramping/grabbing in R quad.  Concurrent with DOE.      Hip Flexor Stretch    Lying on back near edge of bed, bend left leg, foot flat. Hang right leg over edge, relaxed, thigh resting entirely on bed for _1-2___ minutes. Repeat __2__ times. Do _2___ sessions per day. Advanced Exercise: Bend knee back keeping thigh in contact with bed.              [facility] Adult PT Treatment/Exercise - 01/31/17 2143      Exercises   Exercises Lumbar;Knee/Hip     Lumbar Exercises: Stretches   Hip Flexor Stretch 2 reps;60 seconds  RLE off side of mat   Hip Flexor Stretch Limitations cues to place hand under pelvis to minimize lumbar flexion for increased hip flexor stretch     Knee/Hip Exercises: Stretches   ITB Stretch Right   ITB Stretch Limitations attempted in standing, supine and sidelying but unable to tolerate any position.  Provided pt with tennis ball and educated pt on self myofascial massage of R IT band                PT Education - 01/31/17 2133    Education provided Yes   Education Details continued education on claudication  symptoms, results of walk test, hip flexor and IT band stretches   Person(s) Educated Patient;Spouse   Methods Explanation;Demonstration;Handout   Comprehension Need further instruction          PT Short Term Goals - 01/31/17 2139      PT SHORT TERM GOAL #1   Title (TARGET DATE FOR STG IS 02/13/17) Pt will participate in 6 min walk test of endurance with LTG to be set  Time 3   Period Weeks   Status Achieved     PT SHORT TERM GOAL #2   Title Pt will improve ITB and hip flexor ROM to <15 deg limitation   Baseline lacking 24 deg to full hip extension; ITB TBA   Time 3   Period Weeks   Status On-going     PT SHORT TERM GOAL #3   Title Pt will improve LE strength as indicated by a 5 time sit to stand time of <12 seconds   Baseline 14.7 seconds with R quad pain   Time 3   Period Weeks   Status On-going     PT SHORT TERM GOAL #4   Title Pt will negotiate up/down 12 stairs with alternating sequence and one rails Mod I and report <3/10 pain in RLE   Baseline 5/10 tightness in RLE   Time 3   Period Weeks   Status On-going           PT Long Term Goals - 01/31/17 2140      PT LONG TERM GOAL #1   Title (TARGET DATE FOR ALL LTG IS 03/08/2017) Pt will be independent with LE strength and stretching HEP   Time 6   Period Weeks   Status On-going     PT LONG TERM GOAL #2   Title Pt will be able to complete full 6 min walk test with RLE pain <5/10 and increase distance to >600   Baseline 403 ft; completed 2:30 of full 6 minutes with 7/10 RLE pain   Time 6   Period Weeks   Status Revised     PT LONG TERM GOAL #3   Title Pt will report improved confidence with functional balance as indicated by ABC score of >90%   Baseline 81.9%   Time 6   Period Weeks   Status On-going     PT LONG TERM GOAL #4   Title Pt will negotiate 12 stairs alternating sequence without UE support at MOD I and report RLE pain as <2/10   Time 6   Period Weeks   Status On-going     PT LONG TERM GOAL  #5   Title Pt will perform gait x 500' outside over uneven terrain/curb/sidewalk without AD and MOD I with 2/4 DOE and RLE pain <2/10   Time 6   Period Weeks   Status On-going               Plan - 01/31/17 2134    Clinical Impression Statement Treatment session today with continued focus on assessment of LE pain and response of pain to activity as well as pt endurance.  Pt participated in 6 minute walk test and was only able to complete 2:30 of 6 full minutes.  At minute 2 pt began to c/o 2/10 pain in R thigh and 3-4 DOE.  After 30 more seconds pt reporting 6-7/10 pain and unable to continue.  Following test pt's BP remained unchaged but HR elevated.  Unable to get an accurate post test Sp02 reading due to lack of blood flow to fingers.  Provided pt with hip flexor stretch for home and attempted to stretch IT band but unable to tolerate standing, supine or sidelying positions for stretches.  Provided pt with tennis ball to perform self myofascial massage of R IT band.  Will continue to assess response of pt pain to physical activity.     Rehab Potential Good   Clinical Impairments Affecting Rehab Potential  significant cardiac history   PT Treatment/Interventions ADLs/Self Care Home Management;Moist Heat;Ultrasound;Gait training;Stair training;Functional mobility training;Therapeutic activities;Therapeutic exercise;Balance training;Neuromuscular re-education;Patient/family education;Passive range of motion;Scar mobilization;Energy conservation;Taping   PT Next Visit Plan vestibular assessment R side; sit <> stand for LE strengthening, step ups, endurance activity as long as RLE pain remains <6/10.  Once pain increases to >6/10-cease activity and allow pt to rest.  Monitor vitals closely.     Consulted and Agree with Plan of Care Patient   Family Member Consulted wife      Patient will benefit from skilled therapeutic intervention in order to improve the following deficits and impairments:   Cardiopulmonary status limiting activity, Decreased activity tolerance, Decreased endurance, Decreased strength, Difficulty walking, Impaired flexibility, Pain, Decreased range of motion, Impaired sensation  Visit Diagnosis: Pain in right leg  Difficulty in walking, not elsewhere classified  Muscle weakness (generalized)     Problem List Patient Active Problem List   Diagnosis Date Noted  . Bilateral sensorineural hearing loss 01/23/2017  . Abnormal auditory perception of both ears 01/23/2017  . BPH (benign prostatic hyperplasia) 11/07/2016  . Hypertensive heart disease   . PAF (paroxysmal atrial fibrillation) ([place]) 07/22/2016  . Acute systolic congestive heart failure, NYHA class 4 ([place]) 07/22/2016  . Ventricular bigeminy 07/22/2016  . S/P CABG x 5 07/14/2016  . Unstable angina ([place]) 07/10/2016  . Effort angina (HCC)   . Basal cell carcinoma in situ of skin of left shoulder 05/09/2016  . COPD mixed type ([place]) 02/25/2016  . Counseling regarding end of life decision making 12/22/2014  . Atrial flutter ([place]) 03/27/2014  . Neuropathy due to secondary diabetes mellitus ([place]) 04/14/2010  . Gout 12/13/2009  . Diabetes mellitus without complication ([place]) 75/30/0511  . Hyperlipemia 03/29/2009  . 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 GLAUCOMA 03/29/2009  . Essential hypertension, benign 03/29/2009  . Coronary atherosclerosis 03/29/2009  . ALLERGIC RHINITIS 03/29/2009  . ASTHMA, CHILDHOOD 03/29/2009  . OSA on CPAP 03/29/2009   Raylene Everts, PT, DPT 01/31/17    9:46 PM     [facility] [address] [place], [place], [address] Phone: [phone]   Fax:  [phone]  Name: Tommy Sullivan MRN: [id number] Date of Birth: 12-28-1945

## 2017-02-01 ENCOUNTER — Ambulatory Visit: Payer: PPO | Admitting: Physical Therapy

## 2017-02-01 ENCOUNTER — Encounter: Payer: Self-pay | Admitting: Physical Therapy

## 2017-02-01 ENCOUNTER — Telehealth (HOSPITAL_COMMUNITY): Payer: Self-pay | Admitting: *Deleted

## 2017-02-01 VITALS — HR 68

## 2017-02-01 DIAGNOSIS — M79604 Pain in right leg: Secondary | ICD-10-CM

## 2017-02-01 DIAGNOSIS — M6281 Muscle weakness (generalized): Secondary | ICD-10-CM

## 2017-02-01 DIAGNOSIS — R262 Difficulty in walking, not elsewhere classified: Secondary | ICD-10-CM

## 2017-02-01 MED ORDER — TORSEMIDE 20 MG PO TABS: ORAL_TABLET | ORAL | 3 refills | Status: DC

## 2017-02-01 NOTE — Patient Instructions (Addendum)
Hip Flexor Stretch    Lying on back near edge of bed, bend one leg bringing knee to chest to flatten back. Hang other leg over edge, relaxed, Hold 15-20 seconds. Then bend knee until you feel a stretch in quads. Hold 15-30 seconds. Repeat _2-3___ times on each leg. Do __1-3__ sessions per day.   http://gt2.exer.us/346   Copyright  VHI. All rights reserved.  Chair Sitting    Sit at edge of seat, spine straight, one leg extended. Put a hand on each thigh and bend forward from the hip, keeping spine straight. Lean forward "kiss" wife. Allow hand on extended leg to reach toward toes. Support upper body with other arm. Hold _15-30__ seconds. Repeat _2-3__ times on each leg per session. Do __1-3_ sessions per day.  Copyright  VHI. All rights reserved.  Gastroc, Sitting (Passive)    Sit with strap or towel around ball of foot. Gently pull toward body. Hold _15-30__ seconds.  Repeat _2-3__ times on each leg per session. Do __1-3_ sessions per day.  Copyright  VHI. All rights reserved.    Combine 2 stretches above. Start with heelcord stretch and add hamstring stretch.

## 2017-02-01 NOTE — Telephone Encounter (Signed)
Pt's wife called triage line stating that patient has had a 10 lb weight gain over the last 10 days and has a little more SOB, more so than normal.    I spoke with Oda Kilts, and he advises patient to take 40 mg of torsemide today and tomorrow then increase normal dose to 40 mg BID on Monday, Wednesday, and Fridays and take 40 mg in the AM and 20 mg in the PM on all other days. Also ask them to call us back next week if he doesn't start to see a difference in his weight and breathing.  [name] is agreeable with no further questions at this time. Medication list updated.

## 2017-02-01 NOTE — Therapy (Signed)
[facility] [address] [place], [place], [address] Phone: [phone]   Fax:  [phone]  Physical Therapy Treatment  Patient Details  Name: Tommy Sullivan MRN: [id number] Date of Birth: 1945-11-24 Referring Provider: Jolaine Artist, MD  Encounter Date: 02/01/2017      PT End of Session - 02/01/17 1904    Visit Number 4   Number of Visits 13   Date for PT Re-Evaluation 03/08/17   Authorization Type [employer] $20 co-pay   PT Start Time 0846   PT Stop Time 0930   PT Time Calculation (min) 44 min   Activity Tolerance Patient tolerated treatment well   Behavior During Therapy [facility] for tasks assessed/performed      Past Medical History:  Diagnosis Date  . Allergy   . Arthritis   . Atrial flutter ([place])    a/ 05/2014 s/p RFCA.  Marland Kitchen CAD (coronary artery disease) 10/2007   a. 10/2007 s/p MI/PCI of RCA [facility] - Dr. Collene Mares);  b. 06/2016 MV: EF 34%, inf/inflat infarct; c. 07/2016 Cath: LM nl, LAd 90ost/prox, 72m, 99d, D2 30, RI small, LCX 135m, Om1 40, RCA 50ost, 5 ISR, 49m, 30d, RPDA  80ost, RPL2 70, EF 45-50%;  d. 07/2016 CABG x 5 (LIMA->LAD, VG->Diag, VG->RI, VG->PDA->RPL).  . Chronic systolic CHF (congestive heart failure) ([place])    a. 07/2016 Echo: Ef 30-35%.  . Diabetes mellitus without complication ([place])   . Extrinsic asthma, unspecified   . Glaucoma   . Gout   . Hyperlipidemia   . Hypertensive heart disease   . Ischemic cardiomyopathy    a. 07/2016 Echo: Ef 30-35%, mildly dil LA, mild MR/TR, PASP 24 mmHg.  Marland Kitchen Post-op Afib following CABG (07/2016)    a. 07/2016 - placed on amio post-op.  Marland Kitchen Retinal vascular occlusion, unspecified    Right  . Sleep apnea     Past Surgical History:  Procedure Laterality Date  . ABLATION  05-07-2014   EPS and ablation of atrial flutter by Dr Caryl Comes  . APPENDECTOMY  1954  . ATRIAL FLUTTER ABLATION N/A 05/07/2014   Procedure: ATRIAL FLUTTER ABLATION;  Surgeon: Deboraha Sprang, MD;  Location: [facility] CATH LAB;  Service: Cardiovascular;  Laterality: N/A;  . CARDIAC CATHETERIZATION Left 07/10/2016   Procedure: Left Heart Cath and Coronary Angiography;  Surgeon: Wellington Hampshire, MD;  Location: [facility] CV LAB;  Service: Cardiovascular;  Laterality: Left;  . COLONOSCOPY    . CORONARY ANGIOPLASTY  2009   stent [place]  . CORONARY ARTERY BYPASS GRAFT N/A 07/14/2016   Procedure: CORONARY ARTERY BYPASS GRAFTING (CABG), ON PUMP, TIMES FIVE, USING LEFT INTERNAL MAMMARY ARTERY, RIGHT GREATER SAPHENOUS VEIN HARVESTED ENDOSCOPICALLY;  Surgeon: Ivin Poot, MD;  Location: [facility];  Service: Open Heart Surgery;  Laterality: N/A;  SVG to PD and PL sequential SVG to 1st diagonal SVG to Ramus LIMA to LAD  . NECK SURGERY  1990's   x2, Fusion  . SHOULDER ARTHROSCOPY WITH SUBACROMIAL DECOMPRESSION Left 04/02/2014   Procedure: SHOULDER ARTHROSCOPY WITH SUBACROMIAL DECOMPRESSION WITH ROTATOR CUFF REPAIR;  Surgeon: Nita Sells, MD;  Location: [facility];  Service: Orthopedics;  Laterality: Left;  Left shoulder rotator cuff repair, subacromial decompression  . SKIN SURGERY    . TEE WITHOUT CARDIOVERSION N/A 07/14/2016   Procedure: TRANSESOPHAGEAL ECHOCARDIOGRAM (TEE);  Surgeon: Ivin Poot, MD;  Location: [facility];  Service: Open Heart Surgery;  Laterality: N/A;  . TONSILLECTOMY  Vitals:   02/01/17 0906  Pulse: 68  SpO2: 97%        Subjective Assessment - 02/01/17 0851    Subjective No falls. He has been measuring oxygen with number in high 90s   Patient is accompained by: Family member   Pertinent History Monitor vitals!  Significant cardiac history: MI, CAD with CABG x 5, CHF, AFIB, HTN, DM, asthma, gout, ischemic cardiomyopathy, OSA, cervical fusion, L RTC repair   Limitations Walking   Patient Stated Goals To find out where the pain is coming from and ease the pain   Currently in Pain? No/denies      Self-care: Per pt  request, PT instructed in TED hose proximal fitting as rolls creating band for tourniquet like pressure. Pt & wife verbalized understanding. Per pt request, PT demo, instructed in using chair on bed for elevating LEs.  PT demo proper technique for supine to /from sit via sidelying to decrease back strain & valsalva. Pt return demo understanding.  PT instructed in use of frozen water bottle as option for ITB massage.  Therapeutic Exercise: PT demo, instructed in LE stretches.  Hip Flexor Stretch    Lying on back near edge of bed, bend one leg bringing knee to chest to flatten back. Hang other leg over edge, relaxed, Hold 15-20 seconds. Then bend knee until you feel a stretch in quads. Hold 15-30 seconds. Repeat _2-3___ times on each leg. Do __1-3__ sessions per day.   http://gt2.exer.us/346   Copyright  VHI. All rights reserved.  Chair Sitting    Sit at edge of seat, spine straight, one leg extended. Put a hand on each thigh and bend forward from the hip, keeping spine straight. Lean forward "kiss" wife. Allow hand on extended leg to reach toward toes. Support upper body with other arm. Hold _15-30__ seconds. Repeat _2-3__ times on each leg per session. Do __1-3_ sessions per day.  Copyright  VHI. All rights reserved.  Gastroc, Sitting (Passive)    Sit with strap or towel around ball of foot. Gently pull toward body. Hold _15-30__ seconds.  Repeat _2-3__ times on each leg per session. Do __1-3_ sessions per day.  Copyright  VHI. All rights reserved.    Combine 2 stretches above. Start with heelcord stretch and add hamstring stretch.   PT demo sidelying ITB stretch with LE extended, adducted & internal rotation. Pt needs additional instruction next session.                         PT Education - 02/01/17 0846    Education provided Yes   Education Details using chair upside down on bed for positioning / LE elevation, hip flexor, quad, hamstring & heelcord  stretches   Person(s) Educated Patient;Spouse   Methods Explanation;Demonstration;Tactile cues;Verbal cues;Handout   Comprehension Verbalized understanding;Returned demonstration;Verbal cues required;Tactile cues required;Need further instruction          PT Short Term Goals - 01/31/17 2139      PT SHORT TERM GOAL #1   Title (TARGET DATE FOR STG IS 02/13/17) Pt will participate in 6 min walk test of endurance with LTG to be set   Time 3   Period Weeks   Status Achieved     PT SHORT TERM GOAL #2   Title Pt will improve ITB and hip flexor ROM to <15 deg limitation   Baseline lacking 24 deg to full hip extension; ITB TBA   Time 3   Period  Weeks   Status On-going     PT SHORT TERM GOAL #3   Title Pt will improve LE strength as indicated by a 5 time sit to stand time of <12 seconds   Baseline 14.7 seconds with R quad pain   Time 3   Period Weeks   Status On-going     PT SHORT TERM GOAL #4   Title Pt will negotiate up/down 12 stairs with alternating sequence and one rails Mod I and report <3/10 pain in RLE   Baseline 5/10 tightness in RLE   Time 3   Period Weeks   Status On-going           PT Long Term Goals - 01/31/17 2140      PT LONG TERM GOAL #1   Title (TARGET DATE FOR ALL LTG IS 03/08/2017) Pt will be independent with LE strength and stretching HEP   Time 6   Period Weeks   Status On-going     PT LONG TERM GOAL #2   Title Pt will be able to complete full 6 min walk test with RLE pain <5/10 and increase distance to >600   Baseline 403 ft; completed 2:30 of full 6 minutes with 7/10 RLE pain   Time 6   Period Weeks   Status Revised     PT LONG TERM GOAL #3   Title Pt will report improved confidence with functional balance as indicated by ABC score of >90%   Baseline 81.9%   Time 6   Period Weeks   Status On-going     PT LONG TERM GOAL #4   Title Pt will negotiate 12 stairs alternating sequence without UE support at MOD I and report RLE pain as <2/10   Time  6   Period Weeks   Status On-going     PT LONG TERM GOAL #5   Title Pt will perform gait x 500' outside over uneven terrain/curb/sidewalk without AD and MOD I with 2/4 DOE and RLE pain <2/10   Time 6   Period Weeks   Status On-going               Plan - 02/01/17 1905    Clinical Impression Statement Todays session focus on HEP for LE stretches. Patient appears to have improved understanding.    Rehab Potential Good   Clinical Impairments Affecting Rehab Potential significant cardiac history   PT Treatment/Interventions ADLs/Self Care Home Management;Moist Heat;Ultrasound;Gait training;Stair training;Functional mobility training;Therapeutic activities;Therapeutic exercise;Balance training;Neuromuscular re-education;Patient/family education;Passive range of motion;Scar mobilization;Energy conservation;Taping   PT Next Visit Plan vestibular assessment R side; sit <> stand for LE strengthening, step ups, endurance activity as long as RLE pain remains <6/10.  Once pain increases to >6/10-cease activity and allow pt to rest.  Monitor vitals closely.     Consulted and Agree with Plan of Care Patient   Family Member Consulted wife      Patient will benefit from skilled therapeutic intervention in order to improve the following deficits and impairments:  Cardiopulmonary status limiting activity, Decreased activity tolerance, Decreased endurance, Decreased strength, Difficulty walking, Impaired flexibility, Pain, Decreased range of motion, Impaired sensation  Visit Diagnosis: Pain in right leg  Difficulty in walking, not elsewhere classified  Muscle weakness (generalized)     Problem List Patient Active Problem List   Diagnosis Date Noted  . Bilateral sensorineural hearing loss 01/23/2017  . Abnormal auditory perception of both ears 01/23/2017  . BPH (benign prostatic hyperplasia) 11/07/2016  . Hypertensive heart  disease   . PAF (paroxysmal atrial fibrillation) ([place]) 07/22/2016   . Acute systolic congestive heart failure, NYHA class 4 ([place]) 07/22/2016  . Ventricular bigeminy 07/22/2016  . S/P CABG x 5 07/14/2016  . Unstable angina ([place]) 07/10/2016  . Effort angina ([facility])   . Basal cell carcinoma in situ of skin of left shoulder 05/09/2016  . COPD mixed type ([place]) 02/25/2016  . Counseling regarding end of life decision making 12/22/2014  . Atrial flutter ([place]) 03/27/2014  . Neuropathy due to secondary diabetes mellitus ([place]) 04/14/2010  . Gout 12/13/2009  . Diabetes mellitus without complication ([place]) 89/38/1017  . Hyperlipemia 03/29/2009  . 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 GLAUCOMA 03/29/2009  . Essential hypertension, benign 03/29/2009  . Coronary atherosclerosis 03/29/2009  . ALLERGIC RHINITIS 03/29/2009  . ASTHMA, CHILDHOOD 03/29/2009  . OSA on CPAP 03/29/2009    Ciclaly Mulcahey PT, DPT 02/01/2017, 7:08 PM  [facility] [address] [place], [place], [address] Phone: [phone]   Fax:  [phone]  Name: Tommy Sullivan MRN: [id number] Date of Birth: 01-30-1946

## 2017-02-02 ENCOUNTER — Encounter: Payer: Self-pay | Admitting: Cardiovascular Disease

## 2017-02-02 ENCOUNTER — Ambulatory Visit (INDEPENDENT_AMBULATORY_CARE_PROVIDER_SITE_OTHER): Payer: PPO | Admitting: Cardiovascular Disease

## 2017-02-02 VITALS — BP 82/60 | HR 75 | Ht 69.5 in | Wt 211.5 lb

## 2017-02-02 DIAGNOSIS — E785 Hyperlipidemia, unspecified: Secondary | ICD-10-CM | POA: Diagnosis not present

## 2017-02-02 DIAGNOSIS — I2510 Atherosclerotic heart disease of native coronary artery without angina pectoris: Secondary | ICD-10-CM

## 2017-02-02 DIAGNOSIS — I5022 Chronic systolic (congestive) heart failure: Secondary | ICD-10-CM | POA: Diagnosis not present

## 2017-02-02 DIAGNOSIS — I4891 Unspecified atrial fibrillation: Secondary | ICD-10-CM

## 2017-02-02 DIAGNOSIS — I482 Chronic atrial fibrillation: Secondary | ICD-10-CM | POA: Diagnosis not present

## 2017-02-02 MED ORDER — APIXABAN 5 MG PO TABS: 5.0000 mg | ORAL_TABLET | Freq: Two times a day (BID) | ORAL | 5 refills | Status: DC

## 2017-02-02 NOTE — Patient Instructions (Addendum)
Medication Instructions:  Your physician has recommended you make the following change in your medication:  STOP taking aspirin START taking eliquis 5mg  twice daily   Labwork: BMET, CBC, liver, TSH  Testing/Procedures: Your physician has requested that you have an echocardiogram. Echocardiography is a painless test that uses sound waves to create images of your heart. It provides your doctor with information about the size and shape of your heart and how well your heart's chambers and valves are working. This procedure takes approximately one hour. There are no restrictions for this procedure.    Follow-Up: Your physician recommends that you schedule a follow-up appointment in: 1 month with Dr. Fletcher Anon.    Any Other Special Instructions Will Be Listed Below (If Applicable).     If you need a refill on your cardiac medications before your next appointment, please call your pharmacy.  Echocardiogram An echocardiogram, or echocardiography, uses sound waves (ultrasound) to produce an image of your heart. The echocardiogram is simple, painless, obtained within a short period of time, and offers valuable information to your health care provider. The images from an echocardiogram can provide information such as:  Evidence of coronary artery disease (CAD).  Heart size.  Heart muscle function.  Heart valve function.  Aneurysm detection.  Evidence of a past heart attack.  Fluid buildup around the heart.  Heart muscle thickening.  Assess heart valve function. Tell a health care provider about:  Any allergies you have.  All medicines you are taking, including vitamins, herbs, eye drops, creams, and over-the-counter medicines.  Any problems you or family members have had with anesthetic medicines.  Any blood disorders you have.  Any surgeries you have had.  Any medical conditions you have.  Whether you are pregnant or may be pregnant. What happens before the  procedure? No special preparation is needed. Eat and drink normally. What happens during the procedure?  In order to produce an image of your heart, gel will be applied to your chest and a wand-like tool (transducer) will be moved over your chest. The gel will help transmit the sound waves from the transducer. The sound waves will harmlessly bounce off your heart to allow the heart images to be captured in real-time motion. These images will then be recorded.  You may need an IV to receive a medicine that improves the quality of the pictures. What happens after the procedure? You may return to your normal schedule including diet, activities, and medicines, unless your health care provider tells you otherwise. This information is not intended to replace advice given to you by your health care provider. Make sure you discuss any questions you have with your health care provider. Document Released: 09/15/2000 Document Revised: 05/06/2016 Document Reviewed: 05/26/2013 Elsevier Interactive Patient Education  2017 [employer].

## 2017-02-02 NOTE — Progress Notes (Signed)
Cardiology Office Note   Date:  02/02/2017   ID:  Tommy Sullivan, DOB 04-29-1946, MRN [id number]  PCP:  Tommy Lofts, MD  Cardiologist:   Tommy Sacramento, MD   Chief Complaint  Patient presents with  . other    3 month follow up. Meds reviewed by the pt. verbally. "doing well."       History of Present Illness: Tommy Sullivan is a 71 y.o. male who presents for a follow-up visit regarding coronary artery disease, chronic systolic heart faiure and atrial flutter status post ablation.  He has other chronic medical conditions that include type 2 diabetes, hypertension and hyperlipidemia.   He underwent atrial flutter ablation in August 2015 by Dr. Caryl Sullivan. He does have sleep apnea and uses a CPAP on a regular basis. He has known history of coronary artery disease with previous myocardial infarction  in 2009 with PCI. He was found to have severe three-vessel coronary artery disease in October 2017 with an ejection fraction of 45%. He underwent CABG by Dr. Prescott Sullivan. He had postoperative atrial fibrillation which was successfully treated with amiodarone. The patient developed volume overload that required continuation of torsemide.  He had an echocardiogram done in October after CABG which showed a drop in LV systolic function with an EF of 30-35%. He had a repeat echocardiogram in January of this year which showed an EF of 40-45%, moderate mitral regurgitation, moderate  tricuspid regurgitation with peak systolic pressure of 41 mmHg.  He has been following at the heart failure clinic due to continued issues with volume overload. The patient diuresed well and lost significant amount of weight. He feels better but he continues to be very fatigued with dizziness likely due to low blood pressure. He is noted to be in atrial fibrillation today. No chest pain.   Past Medical History:  Diagnosis Date  . Allergy   . Arthritis   . Atrial flutter ([place])    a/ 05/2014 s/p RFCA.  Marland Kitchen CAD (coronary  artery disease) 10/2007   a. 10/2007 s/p MI/PCI of RCA [facility] - Dr. Collene Sullivan);  b. 06/2016 MV: EF 34%, inf/inflat infarct; c. 07/2016 Cath: LM nl, LAd 90ost/prox, 64m 99d, D2 30, RI small, LCX 1075mOm1 40, RCA 50ost, 5 ISR, 9046m0d, RPDA  80ost, RPL2 70, EF 45-50%;  d. 07/2016 CABG x 5 (LIMA->LAD, VG->Diag, VG->RI, VG->PDA->RPL).  . Chronic systolic CHF (congestive heart failure) ([place]  a. 07/2016 Echo: Ef 30-35%.  . Diabetes mellitus without complication ([facility] . Extrinsic asthma, unspecified   . Glaucoma   . Gout   . Hyperlipidemia   . Hypertensive heart disease   . Ischemic cardiomyopathy    a. 07/2016 Echo: Ef 30-35%, mildly dil LA, mild MR/TR, PASP 24 mmHg.  . 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 07/2016 - placed on amio post-op.  . RMarland Kitchentinal vascular occlusion, unspecified    Right  . Sleep apnea     Past Surgical History:  Procedure Laterality Date  . ABLATION  05-07-2014   EPS and ablation of atrial flutter by Dr KleCaryl Sullivan APPENDECTOMY  1954  . ATRIAL FLUTTER ABLATION N/A 05/07/2014   Procedure: ATRIAL FLUTTER ABLATION;  Surgeon: Tommy SprangD;  [facility];  Service: Cardiovascular;  Laterality: N/A;  . CARDIAC CATHETERIZATION Left 07/10/2016   Procedure: Left Heart Cath and Coronary Angiography;  Surgeon: Tommy HampshireD;  [facility];  Service:  Cardiovascular;  Laterality: Left;  . COLONOSCOPY    . CORONARY ANGIOPLASTY  2009   stent [place]  . CORONARY ARTERY BYPASS GRAFT N/A 07/14/2016   Procedure: CORONARY ARTERY BYPASS GRAFTING (CABG), ON PUMP, TIMES FIVE, USING LEFT INTERNAL MAMMARY ARTERY, RIGHT GREATER SAPHENOUS VEIN HARVESTED ENDOSCOPICALLY;  Surgeon: Tommy Poot, MD;  Location: [facility];  Service: Open Heart Surgery;  Laterality: N/A;  SVG to PD and PL sequential SVG to 1st diagonal SVG to Ramus LIMA to LAD  . NECK SURGERY  1990's   x2, Fusion  . SHOULDER ARTHROSCOPY WITH SUBACROMIAL DECOMPRESSION Left 04/02/2014    Procedure: SHOULDER ARTHROSCOPY WITH SUBACROMIAL DECOMPRESSION WITH ROTATOR CUFF REPAIR;  Surgeon: Tommy Sells, MD;  Location: [facility];  Service: Orthopedics;  Laterality: Left;  Left shoulder rotator cuff repair, subacromial decompression  . SKIN SURGERY    . TEE WITHOUT CARDIOVERSION N/A 07/14/2016   Procedure: TRANSESOPHAGEAL ECHOCARDIOGRAM (TEE);  Surgeon: Tommy Poot, MD;  Location: [facility];  Service: Open Heart Surgery;  Laterality: N/A;  . TONSILLECTOMY       Current Outpatient Prescriptions  Medication Sig Dispense Refill  . aspirin EC 325 MG EC tablet Take 1 tablet (325 mg total) by mouth daily. 30 tablet 0  . Blood Glucose Monitoring Suppl (ONETOUCH VERIO IQ SYSTEM) w/Device KIT Check blood sugar twice a day and as directed. Dx E11.9 1 kit 0  . Cholecalciferol (VITAMIN D3) 1000 units CAPS Take 1,000 Units by mouth daily.     . fenofibrate 160 MG tablet Take 1 tablet (160 mg total) by mouth daily. 90 tablet 3  . glucose blood (ONETOUCH VERIO) test strip Check blood sugar twice a day and as directed. Dx E11.9 100 each 5  . Insulin Glargine (LANTUS SOLOSTAR) 100 UNIT/ML Solostar Pen Inject 5 Units into the skin 2 (two) times daily.     . Insulin Pen Needle (BD PEN NEEDLE NANO U/F) 32G X 4 MM MISC Use to inject lantus twice a day and as instructed. Dx E11.9 100 each 6  . latanoprost (XALATAN) 0.005 % ophthalmic solution Place 1 drop into the left eye at bedtime.   3  . metoprolol succinate (TOPROL-XL) 25 MG 24 hr tablet Take 1 tablet (25 mg total) by mouth at bedtime. 30 tablet 3  . modafinil (PROVIGIL) 200 MG tablet Take 1 tablet (200 mg total) by mouth daily. 90 tablet 3  . Multiple Vitamins-Minerals (MULTIVITAMIN MEN PO) Take 1 tablet by mouth daily.     . Omega-3 Fatty Acids (FISH OIL) 1000 MG CAPS Take 2,000 capsules by mouth 2 (two) times daily.     . ONE TOUCH LANCETS MISC Check blood sugar twice a day and as directed. Dx E11.9 100 each 5  .  potassium chloride 20 MEQ TBCR Take 20 mEq by mouth daily. 30 tablet 3  . rosuvastatin (CRESTOR) 10 MG tablet Take 10 mg by mouth every night 90 tablet 3  . spironolactone (ALDACTONE) 25 MG tablet Take 1 tablet (25 mg total) by mouth daily. 90 tablet 3  . torsemide (DEMADEX) 20 MG tablet Take 2 tabs Twice daily ON Monday, Wednesday, and Friday, take 2 tabs in AM and 1 tab in PM all other days 120 tablet 3   No current facility-administered medications for this visit.     Allergies:   No known allergies    Social History:  The patient  reports that he quit smoking about 28 years ago. His smoking  use included Cigarettes. He has a 20.00 pack-year smoking history. He has never used smokeless tobacco. He reports that he drinks about 1.8 oz of alcohol per week . He reports that he does not use drugs.   Family History:  The patient's family history includes AAA (abdominal aortic aneurysm) in his father; Coronary artery disease in his brother; Diabetes in his brother, mother, and sister; Heart disease in his mother.    ROS:  Please see the history of present illness.   Otherwise, review of systems are positive for none.   All other systems are reviewed and negative.    PHYSICAL EXAM: VS:  BP (!) 82/60 (BP Location: Right Arm, Patient Position: Sitting, Cuff Size: Normal)   Pulse 75   Ht 5' 9.5" (1.765 m)   Wt 211 lb 8 oz (95.9 kg)   BMI 30.79 kg/m  , BMI Body mass index is 30.79 kg/m. GEN: Well nourished, well developed, in no acute distress  HEENT: normal  Neck: Mild JVD, carotid bruits, or masses Cardiac: Irregularly irregular; no murmurs, rubs, or gallops, mild bilateral leg edema worse on the right Respiratory:  clear to auscultation bilaterally, normal work of breathing GI: soft, nontender, nondistended, + BS MS: no deformity or atrophy  Skin: warm and dry, no rash Neuro:  Strength and sensation are intact Psych: euthymic mood, full affect   EKG:  EKG is ordered today.  Atrial  fibrillation with PVCs. Nonspecific IVCD. Prior inferior infarct.  Recent Labs: 07/11/2016: TSH 3.712 07/19/2016: Magnesium 2.2 08/03/2016: Hemoglobin 11.6; Platelets 267 11/01/2016: ALT 19 12/13/2016: B Natriuretic Peptide 1,497.5; BUN 32; Potassium 4.2; Sodium 134 01/30/2017: Creatinine, Ser 1.70    Lipid Panel    Component Value Date/Time   CHOL 95 11/01/2016 0811   TRIG 84.0 11/01/2016 0811   HDL 20.00 (L) 11/01/2016 0811   CHOLHDL 5 11/01/2016 0811   VLDL 16.8 11/01/2016 0811   LDLCALC 58 11/01/2016 0811   LDLDIRECT 71.0 11/24/2015 0831      Wt Readings from Last 3 Encounters:  02/02/17 211 lb 8 oz (95.9 kg)  01/31/17 207 lb (93.9 kg)  01/23/17 207 lb (93.9 kg)      No flowsheet data found.    ASSESSMENT AND PLAN:  1.  Coronary artery disease involving native coronary arteries without angina: No chest pain but he continues to have significant exertional dyspnea.  2. Chronic systolic heart failure: Volume status improved significantly with spironolactone on torsemide.  However, renal function has been gradually worsening. I'm going to check routine labs. I will recheck his echocardiogram given his continued symptoms.  3. Atrial fibrillation: This is noted on EKG today. Ventricular rate is controlled. I elected to start him on Eliquis 5 mg twice daily and I stopped aspirin. I requested routine labs.  4. Hyperlipidemia:  Currently on rosuvastatin, fenofibrate and fish oil.  lipid profile was reviewed and was optimal except for low HDL.   5. Diabetes mellitus: Managed by his primary care physician .  I am overall concerned about Tommy Sullivan as he appears to be very deconditioned with significant muscle wasting. I wonder if he has some other systemic problems contributing or whether all his symptoms are coming from his cardiac status.  Disposition:   FU with me in 1 months  Signed,  Tommy Sacramento, MD  02/02/2017 10:48 AM    [place]

## 2017-02-03 LAB — BASIC METABOLIC PANEL
BUN/Creatinine Ratio: 26 — ABNORMAL HIGH (ref 10–24)
BUN: 49 mg/dL — ABNORMAL HIGH (ref 8–27)
CHLORIDE: 95 mmol/L — AB (ref 96–106)
CO2: 24 mmol/L (ref 18–29)
Calcium: 9.1 mg/dL (ref 8.6–10.2)
Creatinine, Ser: 1.9 mg/dL — ABNORMAL HIGH (ref 0.76–1.27)
GFR calc Af Amer: 40 mL/min/{1.73_m2} — ABNORMAL LOW (ref >59)
GFR calc non Af Amer: 35 mL/min/{1.73_m2} — ABNORMAL LOW (ref >59)
GLUCOSE: 97 mg/dL (ref 65–99)
POTASSIUM: 4.5 mmol/L (ref 3.5–5.2)
SODIUM: 137 mmol/L (ref 134–144)

## 2017-02-03 LAB — HEPATIC FUNCTION PANEL
ALBUMIN: 3.2 g/dL — AB (ref 3.5–4.8)
ALT: 39 IU/L (ref 0–44)
AST: 59 IU/L — AB (ref 0–40)
Alkaline Phosphatase: 44 IU/L (ref 39–117)
Bilirubin Total: 2.5 mg/dL — ABNORMAL HIGH (ref 0.0–1.2)
Bilirubin, Direct: 1.91 mg/dL — ABNORMAL HIGH (ref 0.00–0.40)
Total Protein: 5.8 g/dL — ABNORMAL LOW (ref 6.0–8.5)

## 2017-02-03 LAB — CBC
Hematocrit: 40.3 % (ref 37.5–51.0)
Hemoglobin: 13.8 g/dL (ref 13.0–17.7)
MCH: 31.2 pg (ref 26.6–33.0)
MCHC: 34.2 g/dL (ref 31.5–35.7)
MCV: 91 fL (ref 79–97)
Platelets: 136 10*3/uL — ABNORMAL LOW (ref 150–379)
RBC: 4.43 x10E6/uL (ref 4.14–5.80)
RDW: 20 % — AB (ref 12.3–15.4)
WBC: 6.5 10*3/uL (ref 3.4–10.8)

## 2017-02-03 LAB — TSH: TSH: 7.87 u[IU]/mL — ABNORMAL HIGH (ref 0.450–4.500)

## 2017-02-05 DIAGNOSIS — R42 Dizziness and giddiness: Secondary | ICD-10-CM | POA: Diagnosis not present

## 2017-02-05 DIAGNOSIS — H903 Sensorineural hearing loss, bilateral: Secondary | ICD-10-CM | POA: Diagnosis not present

## 2017-02-06 ENCOUNTER — Encounter: Payer: Self-pay | Admitting: Physical Therapy

## 2017-02-06 ENCOUNTER — Ambulatory Visit: Payer: PPO | Admitting: Physical Therapy

## 2017-02-06 ENCOUNTER — Other Ambulatory Visit: Payer: Self-pay

## 2017-02-06 ENCOUNTER — Telehealth (HOSPITAL_COMMUNITY): Payer: Self-pay | Admitting: *Deleted

## 2017-02-06 VITALS — BP 78/58 | HR 82

## 2017-02-06 DIAGNOSIS — M6281 Muscle weakness (generalized): Secondary | ICD-10-CM

## 2017-02-06 DIAGNOSIS — M79604 Pain in right leg: Secondary | ICD-10-CM | POA: Diagnosis not present

## 2017-02-06 DIAGNOSIS — R262 Difficulty in walking, not elsewhere classified: Secondary | ICD-10-CM

## 2017-02-06 MED ORDER — TORSEMIDE 20 MG PO TABS: 20.0000 mg | ORAL_TABLET | Freq: Two times a day (BID) | ORAL | 3 refills | Status: DC

## 2017-02-06 MED ORDER — SPIRONOLACTONE 25 MG PO TABS: 12.5000 mg | ORAL_TABLET | Freq: Every day | ORAL | 3 refills | Status: DC

## 2017-02-06 NOTE — Therapy (Signed)
[facility] [address] [place], [place], [address] Phone: [phone]   Fax:  [phone]  Physical Therapy Treatment  Patient Details  Name: Tommy Sullivan MRN: [id number] Date of Birth: Nov 13, 1945 Referring Provider: Jolaine Artist, MD  Encounter Date: 02/06/2017      PT End of Session - 02/06/17 1212    Visit Number 5   Number of Visits 13   Date for PT Re-Evaluation 03/08/17   Authorization Type [employer] $20 co-pay   PT Start Time 0932   PT Stop Time 1015   PT Time Calculation (min) 43 min   Activity Tolerance Other (comment)  patient limited due to desaturation   Behavior During Therapy [facility] for tasks assessed/performed      Past Medical History:  Diagnosis Date  . Allergy   . Arthritis   . Atrial flutter ([place])    a/ 05/2014 s/p RFCA.  Marland Kitchen CAD (coronary artery disease) 10/2007   a. 10/2007 s/p MI/PCI of RCA [facility] - Dr. Collene Mares);  b. 06/2016 MV: EF 34%, inf/inflat infarct; c. 07/2016 Cath: LM nl, LAd 90ost/prox, 29m, 99d, D2 30, RI small, LCX 176m, Om1 40, RCA 50ost, 5 ISR, 41m, 30d, RPDA  80ost, RPL2 70, EF 45-50%;  d. 07/2016 CABG x 5 (LIMA->LAD, VG->Diag, VG->RI, VG->PDA->RPL).  . Chronic systolic CHF (congestive heart failure) ([place])    a. 07/2016 Echo: Ef 30-35%.  . Diabetes mellitus without complication ([place])   . Extrinsic asthma, unspecified   . Glaucoma   . Gout   . Hyperlipidemia   . Hypertensive heart disease   . Ischemic cardiomyopathy    a. 07/2016 Echo: Ef 30-35%, mildly dil LA, mild MR/TR, PASP 24 mmHg.  Marland Kitchen Post-op Afib following CABG (07/2016)    a. 07/2016 - placed on amio post-op.  Marland Kitchen Retinal vascular occlusion, unspecified    Right  . Sleep apnea     Past Surgical History:  Procedure Laterality Date  . ABLATION  05-07-2014   EPS and ablation of atrial flutter by Dr Caryl Comes  . APPENDECTOMY  1954  . ATRIAL FLUTTER ABLATION N/A 05/07/2014   Procedure: ATRIAL FLUTTER  ABLATION;  Surgeon: Deboraha Sprang, MD;  Location: [facility] CATH LAB;  Service: Cardiovascular;  Laterality: N/A;  . CARDIAC CATHETERIZATION Left 07/10/2016   Procedure: Left Heart Cath and Coronary Angiography;  Surgeon: Wellington Hampshire, MD;  Location: [place] CV LAB;  Service: Cardiovascular;  Laterality: Left;  . COLONOSCOPY    . CORONARY ANGIOPLASTY  2009   stent [place]  . CORONARY ARTERY BYPASS GRAFT N/A 07/14/2016   Procedure: CORONARY ARTERY BYPASS GRAFTING (CABG), ON PUMP, TIMES FIVE, USING LEFT INTERNAL MAMMARY ARTERY, RIGHT GREATER SAPHENOUS VEIN HARVESTED ENDOSCOPICALLY;  Surgeon: Ivin Poot, MD;  Location: [place];  Service: Open Heart Surgery;  Laterality: N/A;  SVG to PD and PL sequential SVG to 1st diagonal SVG to [place]us LIMA to LAD  . NECK SURGERY  1990's   x2, Fusion  . SHOULDER ARTHROSCOPY WITH SUBACROMIAL DECOMPRESSION Left 04/02/2014   Procedure: SHOULDER ARTHROSCOPY WITH SUBACROMIAL DECOMPRESSION WITH ROTATOR CUFF REPAIR;  Surgeon: Nita Sells, MD;  Location: [place];  Service: Orthopedics;  Laterality: Left;  Left shoulder rotator cuff repair, subacromial decompression  . SKIN SURGERY    . TEE WITHOUT CARDIOVERSION N/A 07/14/2016   Procedure: TRANSESOPHAGEAL ECHOCARDIOGRAM (TEE);  Surgeon: Ivin Poot, MD;  Location: [place];  Service: Open Heart Surgery;  Laterality: N/A;  .  TONSILLECTOMY      Vitals:   02/06/17 0940  BP: (!) 78/58  Pulse: 82  SpO2: 98%        Subjective Assessment - 02/06/17 0939    Subjective Patient denies any new complaints or falls since last PT session. Patient visited cardiologist since last visit, and was in atrial fibrillation during his cardiology appointment. Patient also visited ENT due to ringing in his ears, and the ENT reported they wanted patient to be referred to neurology.  Patient reports his appetite has increased, and he is limiting salt intake. Patient reports noticing decreased  strength in LLE when trying to step up onto a stool.   Patient is accompained by: Family member  wife   Pertinent History Monitor vitals!  Significant cardiac history: MI, CAD with CABG x 5, CHF, AFIB, HTN, DM, asthma, gout, ischemic cardiomyopathy, OSA, cervical fusion, L RTC repair   Limitations Walking   Patient Stated Goals To find out where the pain is coming from and ease the pain   Currently in Pain? No/denies                         [facility] Adult PT Treatment/Exercise - 02/06/17 0001      Lumbar Exercises: Stretches   Hip Flexor Stretch 2 reps;30 seconds   Hip Flexor Stretch Limitations Requires cueing for technique. PT utilized crate on the floor to allow foot to make contact with surface (patient's bed is too tall for foot to be able to make contact with the floor). Progressed exercise to include quadriceps stretch by cueing patient to flex knee in hip flexor stretch position on edge of bed.   ITB Stretch 2 reps;30 seconds   ITB Stretch Limitations Patient in sidelying position at edge of bed.       Vital Signs during exercises listed, above:   L ITB stretch:  SpO2: 83% (remained in position additional 1 minute, SpO2 elevated to 92%). Patient remained asymptomatic. Returned to seated position, and SpO2 rose to 97%.   L hip flexor stretch: SpO2: 97% & HR: 74bpm   R hip flexor stretch: SpO2:  83% & HR: 85bpm  Returned patient to seated position on the edge of the bed and recorded: HR: 46bpm  & SpO2: 81%.  PT immediately had patient begin pursed lip breathing. SpO2 rose to 95% and HR=59bpm. PT educated patient and patient's wife on decreased safety with respect to his tendency to desaturate when performing stretching activities and transfers supine/sidelying to sitting.             PT Education - 02/06/17 0950    Education provided Yes   Education Details only perform HEP when he is wearing SpO2 monitor at home - educated to discontinue exercises if he  desaturates <90% SpO2, technique and safety in "stepping up" onto surfaces/chairs due to patient's report of feeling off balance when completing this task at home; danger and risk associated with desaturation and activity modification due to desaturation, educated about PT's need to discontinue PT treatment until patient is medically stable   Person(s) Educated Patient;Spouse   Methods Explanation;Demonstration;Tactile cues;Verbal cues   Comprehension Verbalized understanding;Returned demonstration;Verbal cues required;Tactile cues required          PT Short Term Goals - 01/31/17 2139      PT SHORT TERM GOAL #1   Title (TARGET DATE FOR STG IS 02/13/17) Pt will participate in 6 min walk test of endurance with LTG to  be set   Time 3   Period Weeks   Status Achieved     PT SHORT TERM GOAL #2   Title Pt will improve ITB and hip flexor ROM to <15 deg limitation   Baseline lacking 24 deg to full hip extension; ITB TBA   Time 3   Period Weeks   Status On-going     PT SHORT TERM GOAL #3   Title Pt will improve LE strength as indicated by a 5 time sit to stand time of <12 seconds   Baseline 14.7 seconds with R quad pain   Time 3   Period Weeks   Status On-going     PT SHORT TERM GOAL #4   Title Pt will negotiate up/down 12 stairs with alternating sequence and one rails Mod I and report <3/10 pain in RLE   Baseline 5/10 tightness in RLE   Time 3   Period Weeks   Status On-going           PT Long Term Goals - 01/31/17 2140      PT LONG TERM GOAL #1   Title (TARGET DATE FOR ALL LTG IS 03/08/2017) Pt will be independent with LE strength and stretching HEP   Time 6   Period Weeks   Status On-going     PT LONG TERM GOAL #2   Title Pt will be able to complete full 6 min walk test with RLE pain <5/10 and increase distance to >600   Baseline 403 ft; completed 2:30 of full 6 minutes with 7/10 RLE pain   Time 6   Period Weeks   Status Revised     PT LONG TERM GOAL #3   Title Pt  will report improved confidence with functional balance as indicated by ABC score of >90%   Baseline 81.9%   Time 6   Period Weeks   Status On-going     PT LONG TERM GOAL #4   Title Pt will negotiate 12 stairs alternating sequence without UE support at MOD I and report RLE pain as <2/10   Time 6   Period Weeks   Status On-going     PT LONG TERM GOAL #5   Title Pt will perform gait x 500' outside over uneven terrain/curb/sidewalk without AD and MOD I with 2/4 DOE and RLE pain <2/10   Time 6   Period Weeks   Status On-going               Plan - 02/06/17 1214    Clinical Impression Statement Today's skilled PT session started with LE stretching, but the exercises had to end prematurely due to the patient rapidly desaturating when transferring into a supine or sidelying position. Patient's SpO2 rose when he would return to a seated position. Once when patient was in a supine position, the patient's SpO2 dropped to 81% saturation. PT immediately had the patient initiate pursed lip breathing and returned him to a seated position at the edge of the bed. Patient's SpO2 rose to 97% after approximately 2 minutes of pursed lip breathing. PT will plan to hold remaining visits until patient is medically stable.    Rehab Potential Good   Clinical Impairments Affecting Rehab Potential significant cardiac history   PT Treatment/Interventions ADLs/Self Care Home Management;Moist Heat;Ultrasound;Gait training;Stair training;Functional mobility training;Therapeutic activities;Therapeutic exercise;Balance training;Neuromuscular re-education;Patient/family education;Passive range of motion;Scar mobilization;Energy conservation;Taping   PT Next Visit Plan hold remaining PT visits until patient is medically stable (currently not stable due to  rapid desaturation)    Consulted and Agree with Plan of Care Patient   Family Member Consulted wife      Patient will benefit from skilled therapeutic  intervention in order to improve the following deficits and impairments:  Cardiopulmonary status limiting activity, Decreased activity tolerance, Decreased endurance, Decreased strength, Difficulty walking, Impaired flexibility, Pain, Decreased range of motion, Impaired sensation  Visit Diagnosis: Pain in right leg  Difficulty in walking, not elsewhere classified  Muscle weakness (generalized)     Problem List Patient Active Problem List   Diagnosis Date Noted  . Bilateral sensorineural hearing loss 01/23/2017  . Abnormal auditory perception of both ears 01/23/2017  . BPH (benign prostatic hyperplasia) 11/07/2016  . Hypertensive heart disease   . PAF (paroxysmal atrial fibrillation) ([place]) 07/22/2016  . Acute systolic congestive heart failure, NYHA class 4 ([place]) 07/22/2016  . Ventricular bigeminy 07/22/2016  . S/P CABG x 5 07/14/2016  . Unstable angina ([place]) 07/10/2016  . Effort angina ([facility])   . Basal cell carcinoma in situ of skin of left shoulder 05/09/2016  . COPD mixed type ([place]) 02/25/2016  . Counseling regarding end of life decision making 12/22/2014  . Atrial flutter ([place]) 03/27/2014  . Neuropathy due to secondary diabetes mellitus ([place]) 04/14/2010  . Gout 12/13/2009  . Diabetes mellitus without complication ([place]) 59/16/3846  . Hyperlipemia 03/29/2009  . 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 GLAUCOMA 03/29/2009  . Essential hypertension, benign 03/29/2009  . Coronary atherosclerosis 03/29/2009  . ALLERGIC RHINITIS 03/29/2009  . ASTHMA, CHILDHOOD 03/29/2009  . OSA on CPAP 03/29/2009    Arelia Sneddon, SPT  02/06/2017, 12:24 PM  Jamey Reas, PT, DPT PT Specializing in [place] 02/06/17 12:27 PM Phone:  [phone]  Fax:  [phone] [facility] [address] [place], [place] [address]  [facility] [address] [place], [place], [address] Phone: [phone]    Fax:  [phone]  Name: Tommy Sullivan MRN: [id number] Date of Birth: 1945/10/31

## 2017-02-06 NOTE — Telephone Encounter (Signed)
[name] called triage line stating that she was concerned about what to do with patient's medications.  She stated Dr. Fletcher Anon had decreased his torsemide and decreased his spironolactone due to dehydration.  Also stated that he is currently in Afib.  Patient's BP was 78/58 at rehab and she is going to hold his Troprol-XL tonight. She said patient doesn't have any symptoms except he has SOB when lying flat.   I'm sending this message to Dr. Haroldine Laws to review and will call patient back with his recommendations.

## 2017-02-06 NOTE — Progress Notes (Signed)
Noted. Per [facility] Dr. Jerald Kief  In process of making recommendations.

## 2017-02-06 NOTE — Progress Notes (Unsigned)
Reviewed results and recommendations w/pt who verbalized understanding.   

## 2017-02-07 ENCOUNTER — Ambulatory Visit: Payer: PPO | Admitting: Physical Therapy

## 2017-02-07 ENCOUNTER — Telehealth: Payer: Self-pay | Admitting: Cardiovascular Disease

## 2017-02-07 NOTE — Telephone Encounter (Signed)
now

## 2017-02-07 NOTE — Telephone Encounter (Signed)
pts wife Enid Derry [phone]) called again today and left a VM that she is really concerned because know his weight is up 4lbs overnight and he seems confused.

## 2017-02-07 NOTE — Telephone Encounter (Signed)
S/w pt's wife, Enid Derry (on [place]), who reports pt missed a call this morning from our office. Informed wife that I am unsure who called as I last spoke with pt on 5/8 with medication changes. She states they called [facility] yesterday for an appt w/Dr. Haroldine Laws for his opinion regarding diuretics. Confirmed 5/11 echo appt. Wife understands to contact PCP regarding further labs as reviewed with patient on Tuesday.

## 2017-02-07 NOTE — Telephone Encounter (Signed)
Patient scheduled for office visit 5/11 at 10am with Dr.Bensimhon.

## 2017-02-09 ENCOUNTER — Other Ambulatory Visit: Payer: PPO

## 2017-02-09 ENCOUNTER — Ambulatory Visit (HOSPITAL_BASED_OUTPATIENT_CLINIC_OR_DEPARTMENT_OTHER)
Admission: RE | Admit: 2017-02-09 | Discharge: 2017-02-09 | Disposition: A | Discharge status: Home / Self Care | Payer: PPO | Source: Ambulatory Visit | Attending: Internal Medicine | Admitting: Internal Medicine

## 2017-02-09 ENCOUNTER — Encounter (HOSPITAL_COMMUNITY): Payer: Self-pay | Admitting: Internal Medicine

## 2017-02-09 ENCOUNTER — Inpatient Hospital Stay (HOSPITAL_COMMUNITY)
Admission: RE | Admit: 2017-02-09 | Discharge: 2017-02-19 | DRG: 270 | Disposition: A | Discharge status: Other Acute Inpatient Hospital | Payer: PPO | Source: Ambulatory Visit | Attending: Internal Medicine | Admitting: Internal Medicine

## 2017-02-09 VITALS — BP 102/68 | HR 66 | Ht 69.5 in | Wt 221.5 lb

## 2017-02-09 DIAGNOSIS — E119 Type 2 diabetes mellitus without complications: Secondary | ICD-10-CM

## 2017-02-09 DIAGNOSIS — E854 Organ-limited amyloidosis: Secondary | ICD-10-CM | POA: Diagnosis not present

## 2017-02-09 DIAGNOSIS — I130 Hypertensive heart and chronic kidney disease with heart failure and stage 1 through stage 4 chronic kidney disease, or unspecified chronic kidney disease: Secondary | ICD-10-CM | POA: Diagnosis not present

## 2017-02-09 DIAGNOSIS — M109 Gout, unspecified: Secondary | ICD-10-CM | POA: Insufficient documentation

## 2017-02-09 DIAGNOSIS — Z7901 Long term (current) use of anticoagulants: Secondary | ICD-10-CM

## 2017-02-09 DIAGNOSIS — G4733 Obstructive sleep apnea (adult) (pediatric): Secondary | ICD-10-CM

## 2017-02-09 DIAGNOSIS — G4730 Sleep apnea, unspecified: Secondary | ICD-10-CM | POA: Diagnosis present

## 2017-02-09 DIAGNOSIS — E871 Hypo-osmolality and hyponatremia: Secondary | ICD-10-CM | POA: Diagnosis not present

## 2017-02-09 DIAGNOSIS — I480 Paroxysmal atrial fibrillation: Secondary | ICD-10-CM

## 2017-02-09 DIAGNOSIS — Z515 Encounter for palliative care: Secondary | ICD-10-CM | POA: Diagnosis not present

## 2017-02-09 DIAGNOSIS — Z87891 Personal history of nicotine dependence: Secondary | ICD-10-CM

## 2017-02-09 DIAGNOSIS — H409 Unspecified glaucoma: Secondary | ICD-10-CM | POA: Diagnosis present

## 2017-02-09 DIAGNOSIS — I4892 Unspecified atrial flutter: Secondary | ICD-10-CM

## 2017-02-09 DIAGNOSIS — K567 Ileus, unspecified: Secondary | ICD-10-CM | POA: Diagnosis not present

## 2017-02-09 DIAGNOSIS — E1122 Type 2 diabetes mellitus with diabetic chronic kidney disease: Secondary | ICD-10-CM | POA: Diagnosis present

## 2017-02-09 DIAGNOSIS — Z79899 Other long term (current) drug therapy: Secondary | ICD-10-CM | POA: Insufficient documentation

## 2017-02-09 DIAGNOSIS — Z794 Long term (current) use of insulin: Secondary | ICD-10-CM

## 2017-02-09 DIAGNOSIS — Z833 Family history of diabetes mellitus: Secondary | ICD-10-CM | POA: Diagnosis not present

## 2017-02-09 DIAGNOSIS — R570 Cardiogenic shock: Secondary | ICD-10-CM | POA: Diagnosis present

## 2017-02-09 DIAGNOSIS — E1140 Type 2 diabetes mellitus with diabetic neuropathy, unspecified: Secondary | ICD-10-CM | POA: Diagnosis not present

## 2017-02-09 DIAGNOSIS — I43 Cardiomyopathy in diseases classified elsewhere: Secondary | ICD-10-CM | POA: Diagnosis not present

## 2017-02-09 DIAGNOSIS — M79604 Pain in right leg: Secondary | ICD-10-CM | POA: Diagnosis not present

## 2017-02-09 DIAGNOSIS — Z951 Presence of aortocoronary bypass graft: Secondary | ICD-10-CM

## 2017-02-09 DIAGNOSIS — J45909 Unspecified asthma, uncomplicated: Secondary | ICD-10-CM | POA: Insufficient documentation

## 2017-02-09 DIAGNOSIS — N179 Acute kidney failure, unspecified: Secondary | ICD-10-CM

## 2017-02-09 DIAGNOSIS — J449 Chronic obstructive pulmonary disease, unspecified: Secondary | ICD-10-CM | POA: Diagnosis present

## 2017-02-09 DIAGNOSIS — I5023 Acute on chronic systolic (congestive) heart failure: Secondary | ICD-10-CM | POA: Insufficient documentation

## 2017-02-09 DIAGNOSIS — I5082 Biventricular heart failure: Secondary | ICD-10-CM | POA: Diagnosis present

## 2017-02-09 DIAGNOSIS — Z66 Do not resuscitate: Secondary | ICD-10-CM | POA: Diagnosis not present

## 2017-02-09 DIAGNOSIS — Z981 Arthrodesis status: Secondary | ICD-10-CM | POA: Diagnosis not present

## 2017-02-09 DIAGNOSIS — E440 Moderate protein-calorie malnutrition: Secondary | ICD-10-CM | POA: Diagnosis not present

## 2017-02-09 DIAGNOSIS — Z9889 Other specified postprocedural states: Secondary | ICD-10-CM

## 2017-02-09 DIAGNOSIS — I5043 Acute on chronic combined systolic (congestive) and diastolic (congestive) heart failure: Secondary | ICD-10-CM | POA: Diagnosis not present

## 2017-02-09 DIAGNOSIS — I509 Heart failure, unspecified: Secondary | ICD-10-CM

## 2017-02-09 DIAGNOSIS — Z8249 Family history of ischemic heart disease and other diseases of the circulatory system: Secondary | ICD-10-CM | POA: Diagnosis not present

## 2017-02-09 DIAGNOSIS — I252 Old myocardial infarction: Secondary | ICD-10-CM

## 2017-02-09 DIAGNOSIS — E785 Hyperlipidemia, unspecified: Secondary | ICD-10-CM

## 2017-02-09 DIAGNOSIS — R109 Unspecified abdominal pain: Secondary | ICD-10-CM | POA: Diagnosis present

## 2017-02-09 DIAGNOSIS — I110 Hypertensive heart disease with heart failure: Secondary | ICD-10-CM

## 2017-02-09 DIAGNOSIS — I2510 Atherosclerotic heart disease of native coronary artery without angina pectoris: Secondary | ICD-10-CM | POA: Insufficient documentation

## 2017-02-09 DIAGNOSIS — R42 Dizziness and giddiness: Secondary | ICD-10-CM | POA: Insufficient documentation

## 2017-02-09 DIAGNOSIS — I255 Ischemic cardiomyopathy: Secondary | ICD-10-CM

## 2017-02-09 DIAGNOSIS — E876 Hypokalemia: Secondary | ICD-10-CM | POA: Diagnosis not present

## 2017-02-09 DIAGNOSIS — Z4682 Encounter for fitting and adjustment of non-vascular catheter: Secondary | ICD-10-CM | POA: Diagnosis not present

## 2017-02-09 DIAGNOSIS — R339 Retention of urine, unspecified: Secondary | ICD-10-CM | POA: Diagnosis not present

## 2017-02-09 DIAGNOSIS — I97638 Postprocedural hematoma of a circulatory system organ or structure following other circulatory system procedure: Secondary | ICD-10-CM | POA: Diagnosis not present

## 2017-02-09 DIAGNOSIS — N183 Chronic kidney disease, stage 3 (moderate): Secondary | ICD-10-CM | POA: Diagnosis present

## 2017-02-09 DIAGNOSIS — R54 Age-related physical debility: Secondary | ICD-10-CM | POA: Diagnosis not present

## 2017-02-09 DIAGNOSIS — Z452 Encounter for adjustment and management of vascular access device: Secondary | ICD-10-CM | POA: Diagnosis not present

## 2017-02-09 DIAGNOSIS — R0602 Shortness of breath: Secondary | ICD-10-CM | POA: Diagnosis not present

## 2017-02-09 LAB — COMPREHENSIVE METABOLIC PANEL
ALT: 32 U/L (ref 17–63)
AST: 77 U/L — AB (ref 15–41)
Albumin: 3 g/dL — ABNORMAL LOW (ref 3.5–5.0)
Alkaline Phosphatase: 37 U/L — ABNORMAL LOW (ref 38–126)
Anion gap: 10 (ref 5–15)
BUN: 47 mg/dL — AB (ref 6–20)
CHLORIDE: 97 mmol/L — AB (ref 101–111)
CO2: 23 mmol/L (ref 22–32)
CREATININE: 1.51 mg/dL — AB (ref 0.61–1.24)
Calcium: 8.7 mg/dL — ABNORMAL LOW (ref 8.9–10.3)
GFR calc Af Amer: 52 mL/min — ABNORMAL LOW (ref >60)
GFR calc non Af Amer: 45 mL/min — ABNORMAL LOW (ref >60)
Glucose, Bld: 94 mg/dL (ref 65–99)
POTASSIUM: 5.2 mmol/L — AB (ref 3.5–5.1)
SODIUM: 130 mmol/L — AB (ref 135–145)
Total Bilirubin: 2.7 mg/dL — ABNORMAL HIGH (ref 0.3–1.2)
Total Protein: 5.9 g/dL — ABNORMAL LOW (ref 6.5–8.1)

## 2017-02-09 LAB — TROPONIN I: TROPONIN I: 0.96 ng/mL — AB (ref <0.03)

## 2017-02-09 LAB — CBC
HEMATOCRIT: 41.1 % (ref 39.0–52.0)
HEMOGLOBIN: 14.3 g/dL (ref 13.0–17.0)
MCH: 31.7 pg (ref 26.0–34.0)
MCHC: 34.8 g/dL (ref 30.0–36.0)
MCV: 91.1 fL (ref 78.0–100.0)
Platelets: 137 10*3/uL — ABNORMAL LOW (ref 150–400)
RBC: 4.51 MIL/uL (ref 4.22–5.81)
RDW: 20.2 % — ABNORMAL HIGH (ref 11.5–15.5)
WBC: 6.9 10*3/uL (ref 4.0–10.5)

## 2017-02-09 LAB — BRAIN NATRIURETIC PEPTIDE: B NATRIURETIC PEPTIDE 5: 2071.9 pg/mL — AB (ref 0.0–100.0)

## 2017-02-09 LAB — GLUCOSE, CAPILLARY
GLUCOSE-CAPILLARY: 159 mg/dL — AB (ref 65–99)
GLUCOSE-CAPILLARY: 132 mg/dL — AB (ref 65–99)

## 2017-02-09 LAB — MAGNESIUM: Magnesium: 2 mg/dL (ref 1.7–2.4)

## 2017-02-09 LAB — TSH: TSH: 9.789 u[IU]/mL — AB (ref 0.350–4.500)

## 2017-02-09 MED ORDER — LATANOPROST 0.005 % OP SOLN
1.0000 [drp] | Freq: Every day | OPHTHALMIC | Status: DC
  Administered 2017-02-09 – 2017-02-18 (×10): 1 [drp] via OPHTHALMIC
  Filled 2017-02-09 (×2): qty 2.5

## 2017-02-09 MED ORDER — ROSUVASTATIN CALCIUM 10 MG PO TABS
10.0000 mg | ORAL_TABLET | Freq: Every day | ORAL | Status: DC
  Administered 2017-02-09 – 2017-02-17 (×9): 10 mg via ORAL
  Filled 2017-02-09 (×9): qty 1

## 2017-02-09 MED ORDER — SODIUM CHLORIDE 0.9% FLUSH
3.0000 mL | Freq: Two times a day (BID) | INTRAVENOUS | Status: DC
  Administered 2017-02-09 – 2017-02-17 (×10): 3 mL via INTRAVENOUS

## 2017-02-09 MED ORDER — MODAFINIL 100 MG PO TABS
200.0000 mg | ORAL_TABLET | Freq: Every day | ORAL | Status: DC
  Administered 2017-02-10 – 2017-02-17 (×8): 200 mg via ORAL
  Filled 2017-02-09 (×10): qty 2

## 2017-02-09 MED ORDER — INSULIN ASPART 100 UNIT/ML ~~LOC~~ SOLN
0.0000 [IU] | Freq: Three times a day (TID) | SUBCUTANEOUS | Status: DC
  Administered 2017-02-10 – 2017-02-11 (×4): 2 [IU] via SUBCUTANEOUS
  Administered 2017-02-12: 3 [IU] via SUBCUTANEOUS
  Administered 2017-02-13: 2 [IU] via SUBCUTANEOUS
  Administered 2017-02-14: 3 [IU] via SUBCUTANEOUS
  Administered 2017-02-14 – 2017-02-15 (×3): 2 [IU] via SUBCUTANEOUS
  Administered 2017-02-15: 3 [IU] via SUBCUTANEOUS
  Administered 2017-02-16 (×2): 2 [IU] via SUBCUTANEOUS
  Administered 2017-02-17 (×2): 3 [IU] via SUBCUTANEOUS
  Administered 2017-02-18 – 2017-02-19 (×2): 2 [IU] via SUBCUTANEOUS
  Administered 2017-02-19: 3 [IU] via SUBCUTANEOUS

## 2017-02-09 MED ORDER — SODIUM CHLORIDE 0.9 % IV SOLN: 250.0000 mL | INTRAVENOUS | Status: DC | PRN

## 2017-02-09 MED ORDER — ACETAMINOPHEN 325 MG PO TABS: 650.0000 mg | ORAL_TABLET | ORAL | Status: DC | PRN

## 2017-02-09 MED ORDER — ALPRAZOLAM 0.25 MG PO TABS
0.2500 mg | ORAL_TABLET | Freq: Two times a day (BID) | ORAL | Status: DC | PRN
  Filled 2017-02-09: qty 1

## 2017-02-09 MED ORDER — ONDANSETRON HCL 4 MG/2ML IJ SOLN
4.0000 mg | Freq: Four times a day (QID) | INTRAMUSCULAR | Status: DC | PRN
  Administered 2017-02-18: 4 mg via INTRAVENOUS
  Filled 2017-02-09: qty 2

## 2017-02-09 MED ORDER — ADULT MULTIVITAMIN W/MINERALS CH
ORAL_TABLET | Freq: Every day | ORAL | Status: DC
  Administered 2017-02-11 – 2017-02-19 (×2): 1 via ORAL
  Filled 2017-02-09 (×8): qty 1

## 2017-02-09 MED ORDER — FENOFIBRATE 160 MG PO TABS
160.0000 mg | ORAL_TABLET | Freq: Every day | ORAL | Status: DC
  Administered 2017-02-10 – 2017-02-15 (×6): 160 mg via ORAL
  Filled 2017-02-09 (×6): qty 1

## 2017-02-09 MED ORDER — AMIODARONE LOAD VIA INFUSION
150.0000 mg | Freq: Once | INTRAVENOUS | Status: AC
  Administered 2017-02-09: 150 mg via INTRAVENOUS
  Filled 2017-02-09: qty 83.34

## 2017-02-09 MED ORDER — SODIUM CHLORIDE 0.9% FLUSH: 3.0000 mL | INTRAVENOUS | Status: DC | PRN

## 2017-02-09 MED ORDER — APIXABAN 5 MG PO TABS
5.0000 mg | ORAL_TABLET | Freq: Two times a day (BID) | ORAL | Status: DC
  Administered 2017-02-09 – 2017-02-17 (×16): 5 mg via ORAL
  Filled 2017-02-09 (×16): qty 1

## 2017-02-09 MED ORDER — INSULIN GLARGINE 100 UNIT/ML ~~LOC~~ SOLN
5.0000 [IU] | Freq: Every day | SUBCUTANEOUS | Status: DC
  Administered 2017-02-09 – 2017-02-17 (×9): 5 [IU] via SUBCUTANEOUS
  Filled 2017-02-09 (×11): qty 0.05

## 2017-02-09 MED ORDER — FUROSEMIDE 10 MG/ML IJ SOLN
80.0000 mg | Freq: Two times a day (BID) | INTRAMUSCULAR | Status: DC
  Administered 2017-02-09 – 2017-02-10 (×3): 80 mg via INTRAVENOUS
  Filled 2017-02-09 (×3): qty 8

## 2017-02-09 MED ORDER — VITAMIN D 1000 UNITS PO TABS
1000.0000 [IU] | ORAL_TABLET | Freq: Every day | ORAL | Status: DC
  Administered 2017-02-11 – 2017-02-19 (×2): 1000 [IU] via ORAL
  Filled 2017-02-09 (×12): qty 1

## 2017-02-09 MED ORDER — POTASSIUM CHLORIDE CRYS ER 20 MEQ PO TBCR: 20.0000 meq | EXTENDED_RELEASE_TABLET | Freq: Two times a day (BID) | ORAL | Status: DC

## 2017-02-09 MED ORDER — AMIODARONE HCL IN DEXTROSE 360-4.14 MG/200ML-% IV SOLN
30.0000 mg/h | INTRAVENOUS | Status: DC
  Administered 2017-02-09 – 2017-02-19 (×20): 30 mg/h via INTRAVENOUS
  Filled 2017-02-09 (×21): qty 200

## 2017-02-09 MED ORDER — OMEGA-3-ACID ETHYL ESTERS 1 G PO CAPS
2.0000 g | ORAL_CAPSULE | Freq: Two times a day (BID) | ORAL | Status: DC
  Administered 2017-02-09 – 2017-02-11 (×3): 2 g via ORAL
  Filled 2017-02-09 (×14): qty 2

## 2017-02-09 MED ORDER — SPIRONOLACTONE 25 MG PO TABS
12.5000 mg | ORAL_TABLET | Freq: Every day | ORAL | Status: DC
  Administered 2017-02-10 – 2017-02-11 (×2): 12.5 mg via ORAL
  Filled 2017-02-09 (×2): qty 1

## 2017-02-09 MED ORDER — METOPROLOL SUCCINATE ER 25 MG PO TB24
25.0000 mg | ORAL_TABLET | Freq: Every day | ORAL | Status: DC
  Administered 2017-02-09 – 2017-02-10 (×2): 25 mg via ORAL
  Filled 2017-02-09 (×2): qty 1

## 2017-02-09 MED ORDER — AMIODARONE HCL IN DEXTROSE 360-4.14 MG/200ML-% IV SOLN
60.0000 mg/h | INTRAVENOUS | Status: AC
  Administered 2017-02-09 (×2): 60 mg/h via INTRAVENOUS
  Filled 2017-02-09 (×2): qty 200

## 2017-02-09 NOTE — H&P (Signed)
Advanced Heart Failure Team History and Physical Note   Primary Physician:  Dr. Eliezer Lofts Primary Cardiologist:  Dr. Fletcher Anon  Reason for Admission: Acute on chronic systolic CHF   HPI:    Tommy Sullivan is a 71 y.o. male with a h/o CAD s/p CABG, moderate COPD, DM2, OSA on CPAP, HTN, chronic systolic heart failure (most recent EF 40-45%) and atrial flutter status post ablation.   Admitted October 2017 with worsening exertional dyspnea. Cath showed severe 3V disease, underwent CABG by Dr. Prescott Gum. He had post operative atrial fibrillation and was treated with Amiodarone. He had an echocardiogram done in October after CABG which showed a drop in LV systolic function with an EF of 30-35%. He had a repeat echocardiogram 1/18 which showed an EF of 40-45%, moderate mitral regurgitation, moderate to severe tricuspid regurgitation with peak systolic pressure of 41 mmHg. He was started on spironolactone in addition to torsemide and volume status improved.   He presented today to the HF clinic for follow up. He was volume overloaded on exam, hypotensive. He has been working with PT at home but progression is limited by dizziness and right leg pain. He saw Dr. Fletcher Anon on 5/4/518 and was noted to be in atrial fibrillation, he was started on Eliquis. His weight today was up 10-15 pounds, says he has gained 8 pounds since last night. He is being admitted for IV diuresis, needs IV Amio and possible RHC.   Review of Systems: [y] = yes, _0  = no   General: Weight gain [ y]; Weight loss _1 ; Anorexia _2 ; Fatigue Blue.Reese ]; Fever _3 ; Chills _4 ; Weakness _5   Cardiac: Chest pain/pressure _6 ; Resting SOB Blue.Reese ]; Exertional SOB [ y]; Orthopnea _7 ; Pedal Edema Blue.Reese ]; Palpitations _8 ; Syncope _9 ; Presyncope _10 ; Paroxysmal nocturnal dyspnea_11   Pulmonary: Cough _12 ; Wheezing_13 ; Hemoptysis_14 ; Sputum _15 ; Snoring _16   GI: Vomiting_17 ; Dysphagia_18 ; Melena_19 ; Hematochezia _20 ; Heartburn_21 ; Abdominal pain _22 ;  Constipation _23 ; Diarrhea _24 ; BRBPR _25   GU: Hematuria_26 ; Dysuria _27 ; Nocturia_28   Vascular: Pain in legs with walking Blue.Reese ]; Pain in feet with lying flat _29 ; Non-healing sores _30 ; Stroke _31 ; TIA _32 ; Slurred speech _33 ;  Neuro: Headaches_34 ; Vertigo_35 ; Seizures_36 ; Paresthesias_37 ;Blurred vision _38 ; Diplopia _39 ; Vision changes _40   Ortho/Skin: Arthritis [ y]; Joint pain _41 ; Muscle pain _42 ; Joint swelling _43 ; Back Pain _44 ; Rash _45   Psych: Depression_46 ; Anxiety_47   Heme: Bleeding problems _48 ; Clotting disorders _49 ; Anemia _50   Endocrine: Diabetes _51 ; Thyroid dysfunction_52    Home Medications Prior to Admission medications   Medication Sig Start Date End Date Taking? Authorizing Provider  apixaban (ELIQUIS) 5 MG TABS tablet Take 1 tablet (5 mg total) by mouth 2 (two) times daily. 02/02/17   Wellington Hampshire, MD  Blood Glucose Monitoring Suppl (ONETOUCH VERIO IQ SYSTEM) w/Device KIT Check blood sugar twice a day and as directed. Dx E11.9 10/13/16   Jinny Sanders, MD  Cholecalciferol (VITAMIN D3) 1000 units CAPS Take 1,000 Units by mouth daily.     [provider]  fenofibrate 160 MG tablet Take 1 tablet (160 mg total) by mouth daily. 11/07/16   Bedsole, Amy E, MD  glucose blood (ONETOUCH VERIO) test strip Check blood sugar  twice a day and as directed. Dx E11.9 10/13/16   Jinny Sanders, MD  Insulin Glargine (LANTUS SOLOSTAR) 100 UNIT/ML Solostar Pen Inject 5 Units into the skin 2 (two) times daily.     [provider]  Insulin Pen Needle (BD PEN NEEDLE NANO U/F) 32G X 4 MM MISC Use to inject lantus twice a day and as instructed. Dx E11.9 08/21/16   Bedsole, Amy E, MD  latanoprost (XALATAN) 0.005 % ophthalmic solution Place 1 drop into the left eye at bedtime.  04/21/16   [provider]  metoprolol succinate (TOPROL-XL) 25 MG 24 hr tablet Take 1 tablet (25 mg total) by mouth at bedtime. 01/02/17   Bensimhon, Shaune Pascal, MD  modafinil (PROVIGIL) 200 MG tablet  Take 1 tablet (200 mg total) by mouth daily. 01/29/17   Deneise Lever, MD  Multiple Vitamins-Minerals (MULTIVITAMIN MEN PO) Take 1 tablet by mouth daily.     [provider]  Omega-3 Fatty Acids (FISH OIL) 1000 MG CAPS Take 2,000 capsules by mouth 2 (two) times daily.     [provider]  ONE TOUCH LANCETS MISC Check blood sugar twice a day and as directed. Dx E11.9 10/13/16   Jinny Sanders, MD  potassium chloride 20 MEQ TBCR Take 20 mEq by mouth daily. 11/29/16   Bensimhon, Shaune Pascal, MD  rosuvastatin (CRESTOR) 10 MG tablet Take 10 mg by mouth every night 11/07/16   Bedsole, Amy E, MD  spironolactone (ALDACTONE) 25 MG tablet Take 0.5 tablets (12.5 mg total) by mouth daily. 02/06/17 05/07/17  Wellington Hampshire, MD  torsemide (DEMADEX) 20 MG tablet Take 1 tablet (20 mg total) by mouth 2 (two) times daily. 02/06/17 05/07/17  Wellington Hampshire, MD    Past Medical History: Past Medical History:  Diagnosis Date  . Allergy   . Arthritis   . Atrial flutter ([place])    a/ 05/2014 s/p RFCA.  Marland Kitchen CAD (coronary artery disease) 10/2007   a. 10/2007 s/p MI/PCI of RCA [facility] - Dr. Collene Mares);  b. 06/2016 MV: EF 34%, inf/inflat infarct; c. 07/2016 Cath: LM nl, LAd 90ost/prox, 77m 99d, D2 30, RI small, LCX 1057mOm1 40, RCA 50ost, 5 ISR, 9066m0d, RPDA  80ost, RPL2 70, EF 45-50%;  d. 07/2016 CABG x 5 (LIMA->LAD, VG->Diag, VG->RI, VG->PDA->RPL).  . Chronic systolic CHF (congestive heart failure) ([facility]  a. 07/2016 Echo: Ef 30-35%.  . Diabetes mellitus without complication ([facility] . Extrinsic asthma, unspecified   . Glaucoma   . Gout   . Hyperlipidemia   . Hypertensive heart disease   . Ischemic cardiomyopathy    a. 07/2016 Echo: Ef 30-35%, mildly dil LA, mild MR/TR, PASP 24 mmHg.  . 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 07/2016 - placed on amio post-op.  . RMarland Kitchentinal vascular occlusion, unspecified    Right  . Sleep apnea     Past Surgical History: Past Surgical History:  Procedure Laterality  Date  . ABLATION  05-07-2014   EPS and ablation of atrial flutter by Dr KleCaryl Comes APPENDECTOMY  1954  . ATRIAL FLUTTER ABLATION N/A 05/07/2014   Procedure: ATRIAL FLUTTER ABLATION;  Surgeon: SteDeboraha SprangD;  [facility];  Service: Cardiovascular;  Laterality: N/A;  . CARDIAC CATHETERIZATION Left 07/10/2016   Procedure: Left Heart Cath and Coronary Angiography;  Surgeon: MuhWellington HampshireD;  [facility];  Service: Cardiovascular;  Laterality: Left;  . COLONOSCOPY    .  CORONARY ANGIOPLASTY  2009   stent [place]  . CORONARY ARTERY BYPASS GRAFT N/A 07/14/2016   Procedure: CORONARY ARTERY BYPASS GRAFTING (CABG), ON PUMP, TIMES FIVE, USING LEFT INTERNAL MAMMARY ARTERY, RIGHT GREATER SAPHENOUS VEIN HARVESTED ENDOSCOPICALLY;  Surgeon: Ivin Poot, MD;  Location: [place];  Service: Open Heart Surgery;  Laterality: N/A;  SVG to PD and PL sequential SVG to 1st diagonal SVG to Ramus LIMA to LAD  . NECK SURGERY  1990's   x2, Fusion  . SHOULDER ARTHROSCOPY WITH SUBACROMIAL DECOMPRESSION Left 04/02/2014   Procedure: SHOULDER ARTHROSCOPY WITH SUBACROMIAL DECOMPRESSION WITH ROTATOR CUFF REPAIR;  Surgeon: Nita Sells, MD;  Location: [place];  Service: Orthopedics;  Laterality: Left;  Left shoulder rotator cuff repair, subacromial decompression  . SKIN SURGERY    . TEE WITHOUT CARDIOVERSION N/A 07/14/2016   Procedure: TRANSESOPHAGEAL ECHOCARDIOGRAM (TEE);  Surgeon: Ivin Poot, MD;  Location: [facility];  Service: Open Heart Surgery;  Laterality: N/A;  . TONSILLECTOMY       Family History  Problem Relation Age of Onset  . Diabetes Sister   . Coronary artery disease Brother   . AAA (abdominal aortic aneurysm) Father   . Heart disease Mother   . Diabetes Mother   . Diabetes Brother   . Colon cancer Neg Hx     Social History: Social History   Social History  . Marital status: Married    Spouse name: N/A  . Number of children:  N/A  . Years of education: N/A   Occupational History  . retired Retired   Social History Main Topics  . Smoking status: Former Smoker    Packs/day: 1.00    Years: 20.00    Types: Cigarettes    Quit date: 10/02/1988  . Smokeless tobacco: Never Used     Comment: quit in the 1990's  . Alcohol use 1.8 oz/week    3 Cans of beer per week     Comment: 3-4 drinks per week.   . Drug use: No  . Sexual activity: No   Other Topics Concern  . Not on file   Social History Narrative  . No narrative on file    Allergies:  No Known Allergies  Objective:    Vital Signs: BP 102/68, Pulse 66, weight 221 pounds, SpO2 97%.     Physical Exam: General:  Elderly male, SOB. Wife present  HEENT: normal Neck: supple. JVP to jaw. Carotids 2+ bilat; no bruits. No lymphadenopathy or thyromegaly appreciated. Cor: PMI nondisplaced. Irregular rate and rhythm. No rubs, gallops or murmurs. Lungs: clear in bilateral upper lobes, diminished in bases.  Abdomen: soft, nontender. Distended. No hepatosplenomegaly. No bruits or masses. Good bowel sounds. Extremities: no cyanosis, clubbing, rash. 3+ edema to thighs.  Neuro: alert & oriented x 3, cranial nerves grossly intact. moves all 4 extremities w/o difficulty. Affect pleasant.    Labs: Basic Metabolic Panel:  Recent Labs Lab 02/09/17 1151  NA 130*  K 5.2*  CL 97*  CO2 23  GLUCOSE 94  BUN 47*  CREATININE 1.51*  CALCIUM 8.7*  MG 2.0    Liver Function Tests:  Recent Labs Lab 02/09/17 1151  AST 77*  ALT 32  ALKPHOS 37*  BILITOT 2.7*  PROT 5.9*  ALBUMIN 3.0*   No results for input(s): LIPASE, AMYLASE in the last 168 hours. No results for input(s): AMMONIA in the last 168 hours.  CBC:  Recent Labs Lab 02/09/17 1151  WBC 6.9  HGB 14.3  HCT 41.1  MCV 91.1  PLT 137*    Cardiac Enzymes:  Recent Labs Lab 02/09/17 1151  TROPONINI 0.96*    BNP: BNP (last 3 results)  Recent Labs  08/03/16 1427 12/13/16 1051  02/09/17 1151  BNP 485.2* 1,497.5* 2,071.9*    ProBNP (last 3 results) No results for input(s): PROBNP in the last 8760 hours.   CBG:  Recent Labs Lab 02/09/17 1620  GLUCAP 159*    Coagulation Studies: No results for input(s): LABPROT, INR in the last 72 hours.  Other results: EKG: atrial fibrillation.      Assessment/Plan   1. Acute on chronic systolic heart failure - due to ICM. Last echo 1/18 EF 40-45% --NYHA III-IIIb with marked volume overload in setting of possible low output HF and cardiorenal syndrome and dietary indiscretion --Start lasix 97m IV BID.  --No ACE/ARB with CKD.  - No beta blocker for now.  --Repeat echo 2.  Coronary artery disease involving native coronary arteries s/p CABG 10/17 --No chest pain.  - Continue current medical management.  3. PAF - Start Amio gtt with load.  - This patients CHA2DS2-VASc Score and unadjusted Ischemic Stroke Rate (% per year) is equal to 4.8 % stroke rate/year from a score of 4 Above score calculated as 1 point each if present [CHF, HTN, DM, Vascular=MI/PAD/Aortic Plaque, Age if 65-74, or Male], 2 points each if present [Age > 75, or Stroke/TIA/TE] - Continue Eliquis., he was started on this recently. His first dose was on 02/03/17.  4. History of atrial flutter status post RF ablation in 2015:  5. OSA - Will order CPAP 6. Leg pain  - Will check ABI's.     Length of Stay: 0   BGlori Bickers MD 02/09/2017, 4:41 PM  Advanced Heart Failure Team Pager [phone](M-F; 7a - 4p)  Please contact [facility] for night-coverage after hours (4p -7a ) and weekends on [email or web address]\\   Patient seen and examined with EJettie Booze NP. We discussed all aspects of the encounter. I agree with the assessment and plan as stated above.   He is markedly volume overloaded and failing outpatient therapy. Also concern for low output HF in setting of recurrent AF.   Will admit for IV diuresis and probable TEE and DC-CV. Start  amio. May need inotrope support. Repeat echo.   BGlori Bickers MD  4:42 PM

## 2017-02-09 NOTE — Progress Notes (Signed)
Pt states he has taken all his morning medications, clarified with wife at bedside. Wife stated she will go home and bring back pt home medication for hospital use during stay  Tommy Sullivan

## 2017-02-09 NOTE — Progress Notes (Signed)
Congestive heart failure team in the hallway, stated to give IV lasix, awaiting pharmacy verification  Tommy Sullivan [name]

## 2017-02-09 NOTE — Progress Notes (Signed)
Pt states he needs advance directives notarized, talked to chaplin stated to place spiritual consult,  Tommy Sullivan

## 2017-02-09 NOTE — Progress Notes (Signed)
Pharmacy tech stated they will store pt home medication but will not use home medication if hospital stocks it  [employer]

## 2017-02-09 NOTE — Progress Notes (Signed)
ADVANCED HEART FAILURE CLINIC CONSULT NOTE   Date:  02/09/2017   ID:  Tommy Sullivan, DOB [birth year], MRN [id number]  PCP:  Jinny Sanders, MD  Cardiologist:   Fletcher Anon HF MD: Dr Haroldine Laws  Pulmonary; Dr Annamaria Boots    History of Present Illness: Tommy Sullivan is a 71 y.o. male with a h/o CAD s/p CABG, moderate COPD, DM2, OSA on CPAP, HTN, chronic systolic heart failure (most recent EF 40-45%) and atrial flutter status post ablation who is referred by Dr. Fletcher Anon for evaluation in the HF Clinic.   He had a myocardial infarction  in 2009 with PCI done by Dr. Collene Mares. He underwent atrial flutter ablation in August 2015 by Dr. Caryl Comes.  He was seen in October for significant worsening of exertional dyspnea. He underwent a stress test which showed evidence of prior inferior infarct with a decrease in ejection fraction. He underwent cardiac catheterization which showed severe three-vessel coronary artery disease with an ejection fraction of 45%. The patient underwent CABG by Dr. Prescott Gum in 10/17. He had postoperative atrial fibrillation which was successfully treated with amiodarone. The patient developed volume overload that required continuation of torsemide.   He had an echocardiogram done in October after CABG which showed a drop in LV systolic function with an EF of 30-35%. He had a repeat echocardiogram 1/18 which showed an EF of 40-45%, moderate mitral regurgitation, moderate to severe tricuspid regurgitation with peak systolic pressure of 41 mmHg. He was started on spironolactone in addition to torsemide and volume status improved.   I saw him last month and was improving slowly. REDs Vest was 37% (down from 45%). BP was soft however. Coreg switched to Toprol and referred to PT. However since I last saw him he continues to struggle with fatigue and dizziness. Trying to work with PT but limited by right leg pain and dizziness  BP remains low. Saw Dr. Fletcher Anon on 5/4 and noted to be back in AF,  Started on Eliquis. Creatinine up to 1.9 (from 1.6) he cut back torsemide to 20 bid (from 40/20). He says he eats a lot of ice. Weight up 10-15 pounds from last visit. Says he gained 8 pounds last night. Very weak. Has troble walkign across the room.   ECHO 10/2016 EF 40-45%. Peak PA pressure 41 mm hgm    PFTs FEV1 1.85 (58%) FVC  3.01 (69%) DLCO 79%  Past Medical History:  Diagnosis Date  . Allergy   . Arthritis   . Atrial flutter ([place])    a/ 05/2014 s/p RFCA.  Marland Kitchen CAD (coronary artery disease) 10/2007   a. 10/2007 s/p MI/PCI of RCA [facility] - Dr. Collene Mares);  b. 06/2016 MV: EF 34%, inf/inflat infarct; c. 07/2016 Cath: LM nl, LAd 90ost/prox, 27m 99d, D2 30, RI small, LCX 1076mOm1 40, RCA 50ost, 5 ISR, 9011m0d, RPDA  80ost, RPL2 70, EF 45-50%;  d. 07/2016 CABG x 5 (LIMA->LAD, VG->Diag, VG->RI, VG->PDA->RPL).  . Chronic systolic CHF (congestive heart failure) ([facility]  a. 07/2016 Echo: Ef 30-35%.  . Diabetes mellitus without complication ([facility] . Extrinsic asthma, unspecified   . Glaucoma   . Gout   . Hyperlipidemia   . Hypertensive heart disease   . Ischemic cardiomyopathy    a. 07/2016 Echo: Ef 30-35%, mildly dil LA, mild MR/TR, PASP 24 mmHg.  . 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 07/2016 - placed on amio post-op.  . RMarland Kitchentinal vascular occlusion, unspecified  Right  . Sleep apnea     Past Surgical History:  Procedure Laterality Date  . ABLATION  05-07-2014   EPS and ablation of atrial flutter by Dr Caryl Comes  . APPENDECTOMY  1954  . ATRIAL FLUTTER ABLATION N/A 05/07/2014   Procedure: ATRIAL FLUTTER ABLATION;  Surgeon: Deboraha Sprang, MD;  [facility] LAB;  Service: Cardiovascular;  Laterality: N/A;  . CARDIAC CATHETERIZATION Left 07/10/2016   Procedure: Left Heart Cath and Coronary Angiography;  Surgeon: Wellington Hampshire, MD;  Location: [place] CV LAB;  Service: Cardiovascular;  Laterality: Left;  . COLONOSCOPY    . CORONARY ANGIOPLASTY  2009   stent [place]    . CORONARY ARTERY BYPASS GRAFT N/A 07/14/2016   Procedure: CORONARY ARTERY BYPASS GRAFTING (CABG), ON PUMP, TIMES FIVE, USING LEFT INTERNAL MAMMARY ARTERY, RIGHT GREATER SAPHENOUS VEIN HARVESTED ENDOSCOPICALLY;  Surgeon: Ivin Poot, MD;  Location: [place];  Service: Open Heart Surgery;  Laterality: N/A;  SVG to PD and PL sequential SVG to 1st diagonal SVG to Ramus LIMA to LAD  . NECK SURGERY  1990's   x2, Fusion  . SHOULDER ARTHROSCOPY WITH SUBACROMIAL DECOMPRESSION Left 04/02/2014   Procedure: SHOULDER ARTHROSCOPY WITH SUBACROMIAL DECOMPRESSION WITH ROTATOR CUFF REPAIR;  Surgeon: Nita Sells, MD;  Location: [place];  Service: Orthopedics;  Laterality: Left;  Left shoulder rotator cuff repair, subacromial decompression  . SKIN SURGERY    . TEE WITHOUT CARDIOVERSION N/A 07/14/2016   Procedure: TRANSESOPHAGEAL ECHOCARDIOGRAM (TEE);  Surgeon: Ivin Poot, MD;  Location: [place];  Service: Open Heart Surgery;  Laterality: N/A;  . TONSILLECTOMY       Current Outpatient Prescriptions  Medication Sig Dispense Refill  . apixaban (ELIQUIS) 5 MG TABS tablet Take 1 tablet (5 mg total) by mouth 2 (two) times daily. 60 tablet 5  . Blood Glucose Monitoring Suppl (ONETOUCH VERIO IQ SYSTEM) w/Device KIT Check blood sugar twice a day and as directed. Dx E11.9 1 kit 0  . Cholecalciferol (VITAMIN D3) 1000 units CAPS Take 1,000 Units by mouth daily.     . fenofibrate 160 MG tablet Take 1 tablet (160 mg total) by mouth daily. 90 tablet 3  . glucose blood (ONETOUCH VERIO) test strip Check blood sugar twice a day and as directed. Dx E11.9 100 each 5  . Insulin Glargine (LANTUS SOLOSTAR) 100 UNIT/ML Solostar Pen Inject 5 Units into the skin 2 (two) times daily.     . Insulin Pen Needle (BD PEN NEEDLE NANO U/F) 32G X 4 MM MISC Use to inject lantus twice a day and as instructed. Dx E11.9 100 each 6  . latanoprost (XALATAN) 0.005 % ophthalmic solution Place 1 drop into the left eye  at bedtime.   3  . metoprolol succinate (TOPROL-XL) 25 MG 24 hr tablet Take 1 tablet (25 mg total) by mouth at bedtime. 30 tablet 3  . modafinil (PROVIGIL) 200 MG tablet Take 1 tablet (200 mg total) by mouth daily. 90 tablet 3  . Multiple Vitamins-Minerals (MULTIVITAMIN MEN PO) Take 1 tablet by mouth daily.     . Omega-3 Fatty Acids (FISH OIL) 1000 MG CAPS Take 2,000 capsules by mouth 2 (two) times daily.     . ONE TOUCH LANCETS MISC Check blood sugar twice a day and as directed. Dx E11.9 100 each 5  . potassium chloride 20 MEQ TBCR Take 20 mEq by mouth daily. 30 tablet 3  . rosuvastatin (CRESTOR) 10 MG tablet Take  10 mg by mouth every night 90 tablet 3  . spironolactone (ALDACTONE) 25 MG tablet Take 0.5 tablets (12.5 mg total) by mouth daily. 90 tablet 3  . torsemide (DEMADEX) 20 MG tablet Take 1 tablet (20 mg total) by mouth 2 (two) times daily. 60 tablet 3   No current facility-administered medications for this encounter.     Allergies:   No known allergies    Social History:  The patient  reports that he quit smoking about 28 years ago. His smoking use included Cigarettes. He has a 20.00 pack-year smoking history. He has never used smokeless tobacco. He reports that he drinks about 1.8 oz of alcohol per week . He reports that he does not use drugs.   Family History:  The patient's family history includes AAA (abdominal aortic aneurysm) in his father; Coronary artery disease in his brother; Diabetes in his brother, mother, and sister; Heart disease in his mother.    ROS:  Please see the history of present illness.   Otherwise, review of systems are positive for none.   All other systems are reviewed and negative.   PHYSICAL EXAM: VS:  BP 102/68   Pulse 66   Ht 5' 9.5" (1.765 m)   Wt 221 lb 8 oz (100.5 kg)   SpO2 97%   BMI 32.24 kg/m  , BMI Body mass index is 32.24 kg/m. General:  Walked into clinic with his wife NAD HEENT: normal Neck: supple. JVP to jaw. Carotids 2+ bilat; no  bruits. No lymphadenopathy or thryomegaly appreciated. Cor: PMI nondisplaced. IRR, IRR Lungs: clear decreased at bases Abdomen: soft, nontender, + distended. No hepatosplenomegaly. No bruits or masses. Good bowel sounds. Extremities: no cyanosis, clubbing, rash, 3+ edema into thighs Neuro: alert & oriented x 3, cranial nerves grossly intact. moves all 4 extremities w/o difficulty. Affect pleasant   Recent Labs: 07/19/2016: Magnesium 2.2 08/03/2016: Hemoglobin 11.6 12/13/2016: B Natriuretic Peptide 1,497.5 02/02/2017: ALT 39; BUN 49; Creatinine, Ser 1.90; Platelets 136; Potassium 4.5; Sodium 137; TSH 7.870    Lipid Panel    Component Value Date/Time   CHOL 95 11/01/2016 0811   TRIG 84.0 11/01/2016 0811   HDL 20.00 (L) 11/01/2016 0811   CHOLHDL 5 11/01/2016 0811   VLDL 16.8 11/01/2016 0811   LDLCALC 58 11/01/2016 0811   LDLDIRECT 71.0 11/24/2015 0831      Wt Readings from Last 3 Encounters:  02/09/17 221 lb 8 oz (100.5 kg)  02/02/17 211 lb 8 oz (95.9 kg)  01/31/17 207 lb (93.9 kg)     No flowsheet data found.  ECG: AF 76 inferolateral and anterolateral Qs   ASSESSMENT AND PLAN: 1. Acute on chronic systolic heart failure - due to ICM. Last echo 1/18 EF 40-45% --NYHA III-IIIb with marked volume overload in setting of possible low output HF and cardiorenal syndrome and dietary indiscretion --Admit for IV diuresis and possible RHC --Will need DC-CV to restore NSR as well --Repeat echo 2.  Coronary artery disease involving native coronary arteries s/p CABG 10/17 --No s/s ischemia.  3. PAF --Recurrent. Recently started eliquis. Will need DC-CV. Restart amiodarone 4. History of atrial flutter status post RF ablation in 2015:  5. OSA - Having difficulty with CPAP -not using 6. Leg pain  --Check ABIs     Glori Bickers, MD  02/09/2017 10:37 AM

## 2017-02-09 NOTE — Addendum Note (Signed)
Encounter addended by: Effie Berkshire, RN on: 02/09/2017 11:51 AM<BR>    Actions taken: Visit diagnoses modified, Order list changed, Diagnosis association updated

## 2017-02-09 NOTE — Progress Notes (Signed)
Paged MD regarding pt refusing short acting insulin and wanting to continue home use of Lantus 5 units at bedtime Jonni Sanger PA called back stated to place verbal order for Lantus qhs 5 units.  Tommy Sullivan

## 2017-02-09 NOTE — Progress Notes (Signed)
   Introduced Art gallery manager.  Provided emotional support.  Per patient request, Advanced Directive (AD) documentation left at bedside.  If/when patient decides to move forward w/ AD, please page on-call chaplain or contact the spiritual care department (Mon-Fri 9AM-3PM).  Will follow, as needed.  - Rev. Watkins MDiv ThM

## 2017-02-09 NOTE — Addendum Note (Signed)
Encounter addended by: Effie Berkshire, RN on: 02/09/2017 11:53 AM<BR>    Actions taken: Order list changed, Diagnosis association updated, Sign clinical note

## 2017-02-09 NOTE — Progress Notes (Signed)
PIV 22 g inserted x 1 attempt to RLFA, saline locked, for admission. Patient tolerated well.  Renee Pain, RN

## 2017-02-10 ENCOUNTER — Inpatient Hospital Stay (HOSPITAL_COMMUNITY): Payer: PPO

## 2017-02-10 ENCOUNTER — Encounter (HOSPITAL_COMMUNITY): Payer: PPO

## 2017-02-10 LAB — COOXEMETRY PANEL
Carboxyhemoglobin: 0.6 % (ref 0.5–1.5)
Methemoglobin: 0.9 % (ref 0.0–1.5)
O2 Saturation: 37.2 %
Total hemoglobin: 14.5 g/dL (ref 12.0–16.0)

## 2017-02-10 LAB — CBC
HCT: 42.7 % (ref 39.0–52.0)
Hemoglobin: 14.8 g/dL (ref 13.0–17.0)
MCH: 31.6 pg (ref 26.0–34.0)
MCHC: 34.7 g/dL (ref 30.0–36.0)
MCV: 91 fL (ref 78.0–100.0)
Platelets: 138 10*3/uL — ABNORMAL LOW (ref 150–400)
RBC: 4.69 MIL/uL (ref 4.22–5.81)
RDW: 20 % — AB (ref 11.5–15.5)
WBC: 7.3 10*3/uL (ref 4.0–10.5)

## 2017-02-10 LAB — BASIC METABOLIC PANEL
Anion gap: 12 (ref 5–15)
BUN: 48 mg/dL — AB (ref 6–20)
CALCIUM: 8.8 mg/dL — AB (ref 8.9–10.3)
CO2: 20 mmol/L — ABNORMAL LOW (ref 22–32)
Chloride: 96 mmol/L — ABNORMAL LOW (ref 101–111)
Creatinine, Ser: 1.55 mg/dL — ABNORMAL HIGH (ref 0.61–1.24)
GFR calc Af Amer: 50 mL/min — ABNORMAL LOW (ref >60)
GFR, EST NON AFRICAN AMERICAN: 43 mL/min — AB (ref >60)
GLUCOSE: 120 mg/dL — AB (ref 65–99)
Potassium: 4.4 mmol/L (ref 3.5–5.1)
Sodium: 128 mmol/L — ABNORMAL LOW (ref 135–145)

## 2017-02-10 LAB — GLUCOSE, CAPILLARY
GLUCOSE-CAPILLARY: 115 mg/dL — AB (ref 65–99)
Glucose-Capillary: 109 mg/dL — ABNORMAL HIGH (ref 65–99)
Glucose-Capillary: 149 mg/dL — ABNORMAL HIGH (ref 65–99)
Glucose-Capillary: 137 mg/dL — ABNORMAL HIGH (ref 65–99)

## 2017-02-10 MED ORDER — FUROSEMIDE 10 MG/ML IJ SOLN
120.0000 mg | Freq: Three times a day (TID) | INTRAVENOUS | Status: DC
  Administered 2017-02-10 – 2017-02-16 (×18): 120 mg via INTRAVENOUS
  Filled 2017-02-10 (×2): qty 12
  Filled 2017-02-10: qty 10
  Filled 2017-02-10: qty 12
  Filled 2017-02-10: qty 10
  Filled 2017-02-10 (×4): qty 12
  Filled 2017-02-10: qty 10
  Filled 2017-02-10 (×2): qty 12
  Filled 2017-02-10: qty 10
  Filled 2017-02-10 (×3): qty 12
  Filled 2017-02-10: qty 10
  Filled 2017-02-10 (×2): qty 12
  Filled 2017-02-10: qty 10

## 2017-02-10 MED ORDER — FUROSEMIDE 10 MG/ML IJ SOLN
INTRAMUSCULAR | Status: AC
  Filled 2017-02-10: qty 4

## 2017-02-10 MED ORDER — METOLAZONE 5 MG PO TABS
5.0000 mg | ORAL_TABLET | Freq: Every day | ORAL | Status: DC
  Administered 2017-02-11 – 2017-02-15 (×5): 5 mg via ORAL
  Filled 2017-02-10 (×6): qty 1

## 2017-02-10 MED ORDER — FUROSEMIDE 10 MG/ML IJ SOLN
40.0000 mg | Freq: Once | INTRAMUSCULAR | Status: AC
  Administered 2017-02-10: 40 mg via INTRAVENOUS

## 2017-02-10 MED ORDER — SODIUM CHLORIDE 0.9% FLUSH: 10.0000 mL | INTRAVENOUS | Status: DC | PRN

## 2017-02-10 MED ORDER — SODIUM CHLORIDE 0.9% FLUSH
10.0000 mL | Freq: Two times a day (BID) | INTRAVENOUS | Status: DC
  Administered 2017-02-10 – 2017-02-14 (×8): 10 mL
  Administered 2017-02-15 – 2017-02-16 (×2): 30 mL
  Administered 2017-02-17 – 2017-02-19 (×3): 10 mL

## 2017-02-10 MED ORDER — MILRINONE LACTATE IN DEXTROSE 20-5 MG/100ML-% IV SOLN
0.2500 ug/kg/min | INTRAVENOUS | Status: DC
  Administered 2017-02-10 – 2017-02-11 (×2): 0.25 ug/kg/min via INTRAVENOUS
  Filled 2017-02-10 (×2): qty 100

## 2017-02-10 NOTE — Progress Notes (Signed)
Report Given to receiving RN on 3W22. Pt informed of move wife at the bedside.

## 2017-02-10 NOTE — Progress Notes (Signed)
RN states is moving pt now.  Will notify next RN that PICC to be done this afternoon

## 2017-02-10 NOTE — Progress Notes (Signed)
Orthopedic Tech Progress Note Patient Details:  Tommy Sullivan 1946/06/17 [id number]  Ortho Devices Type of Ortho Device: Louretta Parma boot Ortho Device/Splint Location: bilateral Ortho Device/Splint Interventions: Application   Hildred Priest 02/10/2017, 12:40 PM

## 2017-02-10 NOTE — Progress Notes (Signed)
Spoke with Rn, states pt is currently in a procedure, to hold PICC for now.  Will check back.

## 2017-02-10 NOTE — Progress Notes (Signed)
Peripherally Inserted Central Catheter/Midline Placement  The IV Nurse has discussed with the patient and/or persons authorized to consent for the patient, the purpose of this procedure and the potential benefits and risks involved with this procedure.  The benefits include less needle sticks, lab draws from the catheter, and the patient may be discharged home with the catheter. Risks include, but not limited to, infection, bleeding, blood clot (thrombus formation), and puncture of an artery; nerve damage and irregular heartbeat and possibility to perform a PICC exchange if needed/ordered by physician.  Alternatives to this procedure were also discussed.  Bard Power PICC patient education guide, fact sheet on infection prevention and patient information card has been provided to patient /or left at bedside.    PICC/Midline Placement Documentation  PICC Triple Lumen 84/16/60 PICC Right Basilic 41 cm 0 cm (Active)  Indication for Insertion or Continuance of Line Vasoactive infusions;Prolonged intravenous therapies 02/10/2017  4:00 PM  Exposed Catheter (cm) 0 cm 02/10/2017  4:00 PM  Site Assessment Clean;Dry;Intact 02/10/2017  4:00 PM  Lumen #1 Status Flushed;Saline locked;Blood return noted 02/10/2017  4:00 PM  Lumen #2 Status Flushed;Saline locked;Blood return noted 02/10/2017  4:00 PM  Lumen #3 Status Flushed;Saline locked;Blood return noted 02/10/2017  4:00 PM  Dressing Type Transparent 02/10/2017  4:00 PM  Dressing Status Clean;Dry;Intact;Antimicrobial disc in place 02/10/2017  4:00 PM  Line Care Connections checked and tightened 02/10/2017  4:00 PM  Line Adjustment (NICU/IV Team Only) No 02/10/2017  4:00 PM  Dressing Intervention New dressing 02/10/2017  4:00 PM  Dressing Change Due 02/17/17 02/10/2017  4:00 PM       Rolena Infante 02/10/2017, 4:52 PM

## 2017-02-10 NOTE — Progress Notes (Addendum)
Advanced Heart Failure Rounding Note   Subjective:    Admitted 5/11 with recurrent HF and severe volume overload  Not responding well to IV lasix. Weight up. Very bloated. Legs swelling. + SOB    Objective:   Weight Range:  Vital Signs:   Temp:  [97.6 F (36.4 C)-98.8 F (37.1 C)] 98.8 F (37.1 C) (05/12 0541) Pulse Rate:  [66-79] 66 (05/12 0541) Resp:  [16-18] 16 (05/12 0541) BP: (90-102)/(54-72) 91/54 (05/12 0541) SpO2:  [97 %-98 %] 97 % (05/12 0541) FiO2 (%):  [21 %] 21 % (05/11 1216) Weight:  [99.7 kg (219 lb 14.4 oz)-101.4 kg (223 lb 8 oz)] 101.4 kg (223 lb 8 oz) (05/12 0541) Last BM Date: 02/09/17  Weight change: Filed Weights   02/09/17 1216 02/10/17 0541  Weight: 99.7 kg (219 lb 14.4 oz) 101.4 kg (223 lb 8 oz)    Intake/Output:   Intake/Output Summary (Last 24 hours) at 02/10/17 0951 Last data filed at 02/10/17 0553  Gross per 24 hour  Intake          1435.58 ml  Output             1051 ml  Net           384.58 ml     Physical Exam: General:  Fatigued appearing HEENT: normal Neck: supple. JVP to jaw . Carotids 2+ bilat; no bruits. No lymphadenopathy or thryomegaly appreciated. Cor: PMI laterally displaced. IRR. 2/6 TR Lungs: clear decreased at bases Abdomen: soft, nontender, ++ distended. No hepatosplenomegaly. No bruits or masses. Good bowel sounds. Extremities: no cyanosis, clubbing, rash, 3+ edema into thighs Neuro: alert & orientedx3, cranial nerves grossly intact. moves all 4 extremities w/o difficulty. Affect pleasant  Telemetry: AF 70-80s Personally reviewed   Labs: Basic Metabolic Panel:  Recent Labs Lab 02/09/17 1151 02/10/17 0414  NA 130* 128*  K 5.2* 4.4  CL 97* 96*  CO2 23 20*  GLUCOSE 94 120*  BUN 47* 48*  CREATININE 1.51* 1.55*  CALCIUM 8.7* 8.8*  MG 2.0  --     Liver Function Tests:  Recent Labs Lab 02/09/17 1151  AST 77*  ALT 32  ALKPHOS 37*  BILITOT 2.7*  PROT 5.9*  ALBUMIN 3.0*   No results for  input(s): LIPASE, AMYLASE in the last 168 hours. No results for input(s): AMMONIA in the last 168 hours.  CBC:  Recent Labs Lab 02/09/17 1151 02/10/17 0414  WBC 6.9 7.3  HGB 14.3 14.8  HCT 41.1 42.7  MCV 91.1 91.0  PLT 137* 138*    Cardiac Enzymes:  Recent Labs Lab 02/09/17 1151  TROPONINI 0.96*    BNP: BNP (last 3 results)  Recent Labs  08/03/16 1427 12/13/16 1051 02/09/17 1151  BNP 485.2* 1,497.5* 2,071.9*    ProBNP (last 3 results) No results for input(s): PROBNP in the last 8760 hours.    Other results:  Imaging:  No results found.   Medications:     Scheduled Medications: . apixaban  5 mg Oral BID  . cholecalciferol  1,000 Units Oral Daily  . fenofibrate  160 mg Oral Daily  . furosemide  80 mg Intravenous BID  . insulin aspart  0-15 Units Subcutaneous TID WC  . insulin glargine  5 Units Subcutaneous QHS  . latanoprost  1 drop Left Eye QHS  . metoprolol succinate  25 mg Oral QHS  . modafinil  200 mg Oral Daily  . multivitamin with minerals   Oral Daily  .  omega-3 acid ethyl esters  2 g Oral BID  . rosuvastatin  10 mg Oral q1800  . sodium chloride flush  3 mL Intravenous Q12H  . spironolactone  12.5 mg Oral Daily     Infusions: . sodium chloride    . amiodarone 30 mg/hr (02/10/17 0249)     PRN Medications:  sodium chloride, acetaminophen, ALPRAZolam, ondansetron (ZOFRAN) IV, sodium chloride flush   Assessment:   1. Acute on chronic systolic HF with marked volume overload and R>>L symptoms     --Echo 4/18 EF 40-45%. Mild RV dysfunction Moderate MR, moderate to severe TR 2. PAF, recurrent    --amio started on admit 3. CAD s/p CABG 10/17 4. CKD, III 5. OSA on CPAP 6. h/o AFL s/p ablation 2015 7. Hyponatremia  Plan/Discussion:     He has massive volume overload and is not responding to IV diuresis. Suspect he has low output. Will place PICC and check CVP and co-ox. Increase lasix to 120 q8.  If unable to get PICC today will  start milrinone empirically. Start metolazone.  Repeat echo pending.   Remains in AF. Rate controlled. Continue amio and Eliquis. Will need TEE/DC-CV when more stable. Discussed with PharmD.   Watch renal function closely with diuresis.   Length of Stay: 1 Aarion Metzgar MD 02/10/2017, 9:51 AM  Advanced Heart Failure Team Pager [phone] (M-F; [place])  Please contact [facility] for night-coverage after hours (4p -7a ) and weekends on [email or web address]

## 2017-02-11 ENCOUNTER — Inpatient Hospital Stay (HOSPITAL_COMMUNITY): Payer: PPO

## 2017-02-11 DIAGNOSIS — R570 Cardiogenic shock: Secondary | ICD-10-CM

## 2017-02-11 DIAGNOSIS — N179 Acute kidney failure, unspecified: Secondary | ICD-10-CM

## 2017-02-11 LAB — CBC
HCT: 39.9 % (ref 39.0–52.0)
HEMOGLOBIN: 13.8 g/dL (ref 13.0–17.0)
MCH: 31.2 pg (ref 26.0–34.0)
MCHC: 34.6 g/dL (ref 30.0–36.0)
MCV: 90.1 fL (ref 78.0–100.0)
PLATELETS: 133 10*3/uL — AB (ref 150–400)
RBC: 4.43 MIL/uL (ref 4.22–5.81)
RDW: 20.1 % — ABNORMAL HIGH (ref 11.5–15.5)
WBC: 6.6 10*3/uL (ref 4.0–10.5)

## 2017-02-11 LAB — COOXEMETRY PANEL
Carboxyhemoglobin: 1.3 % (ref 0.5–1.5)
METHEMOGLOBIN: 0.7 % (ref 0.0–1.5)
O2 SAT: 58.9 %
TOTAL HEMOGLOBIN: 12.7 g/dL (ref 12.0–16.0)

## 2017-02-11 LAB — ECHOCARDIOGRAM COMPLETE
Height: 69 in
Weight: 3627.2 oz

## 2017-02-11 LAB — BASIC METABOLIC PANEL
ANION GAP: 11 (ref 5–15)
BUN: 47 mg/dL — ABNORMAL HIGH (ref 6–20)
CALCIUM: 8.5 mg/dL — AB (ref 8.9–10.3)
CO2: 24 mmol/L (ref 22–32)
CREATININE: 1.78 mg/dL — AB (ref 0.61–1.24)
Chloride: 95 mmol/L — ABNORMAL LOW (ref 101–111)
GFR, EST AFRICAN AMERICAN: 43 mL/min — AB (ref >60)
GFR, EST NON AFRICAN AMERICAN: 37 mL/min — AB (ref >60)
Glucose, Bld: 98 mg/dL (ref 65–99)
Potassium: 4 mmol/L (ref 3.5–5.1)
SODIUM: 130 mmol/L — AB (ref 135–145)

## 2017-02-11 LAB — GLUCOSE, CAPILLARY
GLUCOSE-CAPILLARY: 92 mg/dL (ref 65–99)
GLUCOSE-CAPILLARY: 139 mg/dL — AB (ref 65–99)
Glucose-Capillary: 127 mg/dL — ABNORMAL HIGH (ref 65–99)
Glucose-Capillary: 153 mg/dL — ABNORMAL HIGH (ref 65–99)

## 2017-02-11 MED ORDER — DOBUTAMINE IN D5W 4-5 MG/ML-% IV SOLN
5.0000 ug/kg/min | INTRAVENOUS | Status: DC
  Administered 2017-02-11 – 2017-02-15 (×4): 5 ug/kg/min via INTRAVENOUS
  Filled 2017-02-11 (×4): qty 250

## 2017-02-11 NOTE — Plan of Care (Signed)
Problem: Safety: Goal: Ability to remain free from injury will improve Outcome: Progressing Reinforced to patient and his wife the need to call for assistance and they were both shown RN/NT phone numbers listed on white board, bed alarm will be placed on at night D/T lines, IVF's and patient's condition and patient was a little apprehensive but will continue to monitor. Pt verbalized understanding and does follow commands appropriately but still wants to be independent.

## 2017-02-11 NOTE — Progress Notes (Signed)
Received ABI order. Patient had normal ABI at rest 10/2016. Spoke with Dr. Haroldine Laws 02/10/17 regarding repeating study, and was instructed that repeat was necessary. Attempted ABI today, however patient is wearing UNNA boots. Therefore, unable to perform exam. Order will be placed on hold until we receive further instruction.  02/11/2017 10:06 AM Maudry Mayhew, BS, RVT, RDCS, RDMS

## 2017-02-11 NOTE — Progress Notes (Signed)
Will start Milrinone 0.22mcgs/kg/min=7.7cc/hr via right upper arm PICC as per order and will continue to monitor VS and labs, patient pain-free and fairly stable at this time.

## 2017-02-11 NOTE — Progress Notes (Signed)
Report received via Carly RN in patient's room using SBAR format, reviewed labs, VS, PMH, meds, some test results and patient's general condition, assumed care of patient.

## 2017-02-11 NOTE — Progress Notes (Signed)
  Echocardiogram 2D Echocardiogram has been performed.  Tommy Sullivan T Tommy Sullivan 02/11/2017, 3:18 PM

## 2017-02-11 NOTE — Progress Notes (Signed)
Advanced Heart Failure Rounding Note   Subjective:    PICC placed yesterday. Co-ox 37% and CVP 18  Started on milrinone 0.25. Co-ox 59% today. Feels better but still markedly edematous. BP soft with systolics 32-95. No dizziness. Urine utput picking up a little.   No orthopnea or PND.    Objective:   Weight Range:  Vital Signs:   Temp:  [97.4 F (36.3 C)-98.5 F (36.9 C)] 97.4 F (36.3 C) (05/13 0833) Pulse Rate:  [70-78] 78 (05/13 0833) Resp:  [13-29] 16 (05/13 0833) BP: (83-107)/(61-86) 87/70 (05/13 0833) SpO2:  [92 %-98 %] 98 % (05/12 2300) Weight:  [102.4 kg (225 lb 11.2 oz)] 102.4 kg (225 lb 11.2 oz) (05/12 1422) Last BM Date: 02/10/17  Weight change: Filed Weights   02/09/17 1216 02/10/17 0541 02/10/17 1422  Weight: 99.7 kg (219 lb 14.4 oz) 101.4 kg (223 lb 8 oz) 102.4 kg (225 lb 11.2 oz)    Intake/Output:   Intake/Output Summary (Last 24 hours) at 02/11/17 0846 Last data filed at 02/11/17 1884  Gross per 24 hour  Intake            898.6 ml  Output             1150 ml  Net           -251.4 ml     Physical Exam: General:  Sitting on side of bed. NAD HEENT: normal Neck: supple. JVP to ear . Carotids 2+ bilat; no bruits. No lymphadenopathy or thryomegaly appreciated. Cor: PMI laterally displaced. IRR. IRR +s3 Lungs: clear dull in bases Abdomen: soft, nontender, no+distended. No hepatosplenomegaly. No bruits or masses. Good bowel sounds. Extremities: no cyanosis, clubbing, rash, 3+ edema cool  UNNA boots in place Neuro: alert & orientedx3, cranial nerves grossly intact. moves all 4 extremities w/o difficulty. Affect pleasant  Telemetry:  AF 70s. Personally reviewed   Labs: Basic Metabolic Panel:  Recent Labs Lab 02/09/17 1151 02/10/17 0414 02/11/17 0445  NA 130* 128* 130*  K 5.2* 4.4 4.0  CL 97* 96* 95*  CO2 23 20* 24  GLUCOSE 94 120* 98  BUN 47* 48* 47*  CREATININE 1.51* 1.55* 1.78*  CALCIUM 8.7* 8.8* 8.5*  MG 2.0  --   --     Liver  Function Tests:  Recent Labs Lab 02/09/17 1151  AST 77*  ALT 32  ALKPHOS 37*  BILITOT 2.7*  PROT 5.9*  ALBUMIN 3.0*   No results for input(s): LIPASE, AMYLASE in the last 168 hours. No results for input(s): AMMONIA in the last 168 hours.  CBC:  Recent Labs Lab 02/09/17 1151 02/10/17 0414 02/11/17 0445  WBC 6.9 7.3 6.6  HGB 14.3 14.8 13.8  HCT 41.1 42.7 39.9  MCV 91.1 91.0 90.1  PLT 137* 138* 133*    Cardiac Enzymes:  Recent Labs Lab 02/09/17 1151  TROPONINI 0.96*    BNP: BNP (last 3 results)  Recent Labs  08/03/16 1427 12/13/16 1051 02/09/17 1151  BNP 485.2* 1,497.5* 2,071.9*    ProBNP (last 3 results) No results for input(s): PROBNP in the last 8760 hours.    Other results:  Imaging: Dg Chest Port 1 View  Result Date: 02/10/2017 CLINICAL DATA:  post insertion to confirm placement as interpreted by radiologist EXAM: PORTABLE CHEST 1 VIEW COMPARISON:  Chest x-ray dated 08/03/2016. FINDINGS: Right-sided PICC line in place with tip adequately positioned at the level of the lower SVC. Heart size and mediastinal contours are grossly stable given  the lordotic patient positioning. Probable small left pleural effusion. Lungs appear otherwise clear. No pneumothorax seen. IMPRESSION: 1. Right-sided PICC line appears appropriately positioned with tip at the level of the lower SVC. 2. Probable small left pleural effusion. Electronically Signed   By: Franki Cabot M.D.   On: 02/10/2017 16:53      Medications:     Scheduled Medications: . apixaban  5 mg Oral BID  . cholecalciferol  1,000 Units Oral Daily  . fenofibrate  160 mg Oral Daily  . insulin aspart  0-15 Units Subcutaneous TID WC  . insulin glargine  5 Units Subcutaneous QHS  . latanoprost  1 drop Left Eye QHS  . metolazone  5 mg Oral Daily  . metoprolol succinate  25 mg Oral QHS  . modafinil  200 mg Oral Daily  . multivitamin with minerals   Oral Daily  . omega-3 acid ethyl esters  2 g Oral BID    . rosuvastatin  10 mg Oral q1800  . sodium chloride flush  10-40 mL Intracatheter Q12H  . sodium chloride flush  3 mL Intravenous Q12H  . spironolactone  12.5 mg Oral Daily     Infusions: . sodium chloride    . amiodarone 30 mg/hr (02/10/17 2327)  . furosemide Stopped (02/11/17 3267)  . milrinone 0.25 mcg/kg/min (02/11/17 0707)     PRN Medications:  sodium chloride, acetaminophen, ALPRAZolam, ondansetron (ZOFRAN) IV, sodium chloride flush, sodium chloride flush   Assessment:   1. Acute on chronic systolic HF with marked volume overload and R>>L symptoms     --Echo 4/18 EF 40-45%. Mild RV dysfunction Moderate MR, moderate to severe TR    --Initial co-ox 37%  2. Cardiogenic shock 3. PAF, recurrent    --amio started on admit 4. 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 Acute on CKD, III 6. OSA on CPAP 7. h/o AFL s/p ablation 2015 8. Hyponatremia  Plan/Discussion:    He remains very tenuous. Co-ox very low yesterday c/w cardiogenic shock. Now moderately improved with milrinone but BP still soft and extremities cool. Will switch to dobutamine. Watch HR on dobutamine.  Remains in AF. Rate improved on amio. Will watch rate closely with addition of dobutamine. Continue Eliquis. Will need TEE/DC-CV later in week when HF more stable.   Remains very volume overloaded with R>>L symptoms. Continue IV diuresis + metolazone. Renal function slightly worse but hopefully will improve with inotropic support.   Await repeat echo. Suspect EF likely worse than previous     Length of Stay: 2  Rafferty Postlewait MD 02/11/2017, 8:46 AM  Advanced Heart Failure Team Pager [phone] (M-F; 7a - 4p)  Please contact [facility] for night-coverage after hours (4p -7a ) and weekends on [email or web address]

## 2017-02-12 ENCOUNTER — Encounter (HOSPITAL_COMMUNITY): Payer: Self-pay

## 2017-02-12 ENCOUNTER — Encounter (HOSPITAL_COMMUNITY): Payer: PPO

## 2017-02-12 ENCOUNTER — Ambulatory Visit: Payer: PPO | Admitting: Physical Therapy

## 2017-02-12 ENCOUNTER — Encounter: Payer: Self-pay | Admitting: Physical Therapy

## 2017-02-12 DIAGNOSIS — M79604 Pain in right leg: Secondary | ICD-10-CM | POA: Diagnosis not present

## 2017-02-12 LAB — CBC
HCT: 38.2 % — ABNORMAL LOW (ref 39.0–52.0)
Hemoglobin: 13.3 g/dL (ref 13.0–17.0)
MCH: 31.1 pg (ref 26.0–34.0)
MCHC: 34.8 g/dL (ref 30.0–36.0)
MCV: 89.5 fL (ref 78.0–100.0)
Platelets: 105 10*3/uL — ABNORMAL LOW (ref 150–400)
RBC: 4.27 MIL/uL (ref 4.22–5.81)
RDW: 20 % — ABNORMAL HIGH (ref 11.5–15.5)
WBC: 6.1 10*3/uL (ref 4.0–10.5)

## 2017-02-12 LAB — BASIC METABOLIC PANEL
Anion gap: 12 (ref 5–15)
BUN: 47 mg/dL — ABNORMAL HIGH (ref 6–20)
CALCIUM: 8.5 mg/dL — AB (ref 8.9–10.3)
CO2: 26 mmol/L (ref 22–32)
CREATININE: 1.72 mg/dL — AB (ref 0.61–1.24)
Chloride: 92 mmol/L — ABNORMAL LOW (ref 101–111)
GFR calc Af Amer: 44 mL/min — ABNORMAL LOW (ref >60)
GFR, EST NON AFRICAN AMERICAN: 38 mL/min — AB (ref >60)
Glucose, Bld: 117 mg/dL — ABNORMAL HIGH (ref 65–99)
Potassium: 3.1 mmol/L — ABNORMAL LOW (ref 3.5–5.1)
SODIUM: 130 mmol/L — AB (ref 135–145)

## 2017-02-12 LAB — COOXEMETRY PANEL
CARBOXYHEMOGLOBIN: 1 % (ref 0.5–1.5)
Methemoglobin: 1 % (ref 0.0–1.5)
O2 SAT: 63.9 %
TOTAL HEMOGLOBIN: 13.5 g/dL (ref 12.0–16.0)

## 2017-02-12 LAB — GLUCOSE, CAPILLARY
GLUCOSE-CAPILLARY: 123 mg/dL — AB (ref 65–99)
GLUCOSE-CAPILLARY: 170 mg/dL — AB (ref 65–99)
Glucose-Capillary: 125 mg/dL — ABNORMAL HIGH (ref 65–99)
Glucose-Capillary: 135 mg/dL — ABNORMAL HIGH (ref 65–99)

## 2017-02-12 MED ORDER — SPIRONOLACTONE 25 MG PO TABS
25.0000 mg | ORAL_TABLET | Freq: Every day | ORAL | Status: DC
  Administered 2017-02-12 – 2017-02-19 (×8): 25 mg via ORAL
  Filled 2017-02-12 (×9): qty 1

## 2017-02-12 MED ORDER — POTASSIUM CHLORIDE CRYS ER 20 MEQ PO TBCR
40.0000 meq | EXTENDED_RELEASE_TABLET | Freq: Two times a day (BID) | ORAL | Status: DC
  Administered 2017-02-12 – 2017-02-18 (×14): 40 meq via ORAL
  Filled 2017-02-12 (×14): qty 2

## 2017-02-12 NOTE — Therapy (Signed)
[facility] [address] [place], [place], [address] Phone: [phone]   Fax:  [phone]  Patient Details  Name: Tommy Sullivan MRN: [id number] Date of Birth: 03/15/1946 Referring Provider:  No ref. provider found  Encounter Date: 02/12/2017  PHYSICAL THERAPY DISCHARGE SUMMARY  Visits from Start of Care: 5 (unable to complete full certification period due to hospitalization and compromised medical condition)  Current functional level related to goals / functional outcomes: Unchanged from eval-pt put on medical hold due to oxygen desaturation during last treatment session.  Pt advised to f/u with cardiologist.  Upon f/u pt was admitted to hospital due to CHF and Afib.  Pt will return to outpatient PT upon D/C from hospital when medically cleared by cardiology to participate in outpatient PT.   Remaining deficits: Pain, LE weakness, impaired activity tolerance/endurance, impaired balance and gait   Education / Equipment: HEP  Plan: Patient agrees to discharge.  Patient goals were not met. Patient is being discharged due to a change in medical status.  ?????          PT Short Term Goals - 02/12/17 1135      PT SHORT TERM GOAL #1   Title (TARGET DATE FOR STG IS 02/13/17) Pt will participate in 6 min walk test of endurance with LTG to be set   Time 3   Period Weeks   Status Unable to assess     PT SHORT TERM GOAL #2   Title Pt will improve ITB and hip flexor ROM to <15 deg limitation   Baseline lacking 24 deg to full hip extension; ITB TBA   Time 3   Period Weeks   Status Unable to assess     PT SHORT TERM GOAL #3   Title Pt will improve LE strength as indicated by a 5 time sit to stand time of <12 seconds   Baseline 14.7 seconds with R quad pain   Time 3   Period Weeks   Status Unable to assess     PT SHORT TERM GOAL #4   Title Pt will negotiate up/down 12 stairs with alternating sequence and one rails  Mod I and report <3/10 pain in RLE   Baseline 5/10 tightness in RLE   Time 3   Period Weeks   Status Unable to assess          PT Long Term Goals - 02/12/17 1135      PT LONG TERM GOAL #1   Title (TARGET DATE FOR ALL LTG IS 03/08/2017) Pt will be independent with LE strength and stretching HEP   Time 6   Period Weeks   Status Unable to assess     PT LONG TERM GOAL #2   Title Pt will be able to complete full 6 min walk test with RLE pain <5/10 and increase distance to >600   Baseline 403 ft; completed 2:30 of full 6 minutes with 7/10 RLE pain   Time 6   Period Weeks   Status Unable to assess     PT LONG TERM GOAL #3   Title Pt will report improved confidence with functional balance as indicated by ABC score of >90%   Baseline 81.9%   Time 6   Period Weeks   Status Unable to assess     PT LONG TERM GOAL #4   Title Pt will negotiate 12 stairs alternating sequence without UE support at MOD I and report RLE pain as <2/10  Time 6   Period Weeks   Status Unable to assess     PT LONG TERM GOAL #5   Title Pt will perform gait x 500' outside over uneven terrain/curb/sidewalk without AD and MOD I with 2/4 DOE and RLE pain <2/10   Time 6   Period Weeks   Status Unable to assess          G-Codes - Feb 26, 2017 1136    Functional Assessment Tool Used (Outpatient Only) 5 times sit to stand, ABC-unchanged-pt D/C due to compromised medical status and admit to hospital   Functional Limitation Mobility: Walking and moving around   Mobility: Walking and Moving Around Goal Status [phone]) At least 1 percent but less than 20 percent impaired, limited or restricted   Mobility: Walking and Moving Around Discharge Status [id number]) At least 20 percent but less than 40 percent impaired, limited or restricted     Raylene Everts, PT, DPT 02-26-17    11:43 AM    [facility] [address] [place], [place], [address] Phone: [phone]    Fax:  [phone]

## 2017-02-12 NOTE — Progress Notes (Signed)
Advanced Heart Failure Rounding Note   Subjective:    PICC placed on 5/12. Co-ox 37% and CVP 18  Started on milrinone 0.25. Co-ox up to 59% but BP soft so switched to dobutamine 5 yesterday. SBP remains in 80-90s (occasionally they are getting 60-70s but maunal follow up not that low)   Urine output picking up. Feels better but still markedly edematous. Co-ox 64%  Weight down 2 pounds.   Rhythm stable. No dizziness, SOB or orthopnea  BMET pending   Objective:   Weight Range:  Vital Signs:   Temp:  [97.4 F (36.3 C)] 97.4 F (36.3 C) (05/14 0345) Pulse Rate:  [66-80] 66 (05/14 0345) Resp:  [15-24] 22 (05/14 0450) BP: (84-100)/(57-78) 98/78 (05/14 0450) SpO2:  [96 %-98 %] 96 % (05/14 0345) Weight:  [102 kg (224 lb 14.4 oz)-102.8 kg (226 lb 11.2 oz)] 102 kg (224 lb 14.4 oz) (05/14 0345) Last BM Date: 02/11/17  Weight change: Filed Weights   02/10/17 1422 02/11/17 1000 02/12/17 0345  Weight: 102.4 kg (225 lb 11.2 oz) 102.8 kg (226 lb 11.2 oz) 102 kg (224 lb 14.4 oz)    Intake/Output:   Intake/Output Summary (Last 24 hours) at 02/12/17 0608 Last data filed at 02/12/17 0400  Gross per 24 hour  Intake           1204.8 ml  Output             4500 ml  Net          -3295.2 ml     Physical Exam: General:  Lying flat in bed. No resp difficulty HEENT: normal Neck: supple. JVP to jaw. Carotids 2+ bilat; no bruits. No lymphadenopathy or thryomegaly appreciated. Cor: PMI laterally displaced. IRR IRR +s3 Lungs: clear with decreased at bases  Abdomen: soft, nontender, + distended. No hepatosplenomegaly. No bruits or masses. Good bowel sounds. Extremities: no cyanosis, clubbing, rash, 3-4+ edema into thigh cool  Neuro: alert & orientedx3, cranial nerves grossly intact. moves all 4 extremities w/o difficulty. Affect pleasant   Telemetry:  AF 60s. Personally reviewed     Labs: Basic Metabolic Panel:  Recent Labs Lab 02/09/17 1151 02/10/17 0414 02/11/17 0445  NA  130* 128* 130*  K 5.2* 4.4 4.0  CL 97* 96* 95*  CO2 23 20* 24  GLUCOSE 94 120* 98  BUN 47* 48* 47*  CREATININE 1.51* 1.55* 1.78*  CALCIUM 8.7* 8.8* 8.5*  MG 2.0  --   --     Liver Function Tests:  Recent Labs Lab 02/09/17 1151  AST 77*  ALT 32  ALKPHOS 37*  BILITOT 2.7*  PROT 5.9*  ALBUMIN 3.0*   No results for input(s): LIPASE, AMYLASE in the last 168 hours. No results for input(s): AMMONIA in the last 168 hours.  CBC:  Recent Labs Lab 02/09/17 1151 02/10/17 0414 02/11/17 0445 02/12/17 0500  WBC 6.9 7.3 6.6 6.1  HGB 14.3 14.8 13.8 13.3  HCT 41.1 42.7 39.9 38.2*  MCV 91.1 91.0 90.1 89.5  PLT 137* 138* 133* PENDING    Cardiac Enzymes:  Recent Labs Lab 02/09/17 1151  TROPONINI 0.96*    BNP: BNP (last 3 results)  Recent Labs  08/03/16 1427 12/13/16 1051 02/09/17 1151  BNP 485.2* 1,497.5* 2,071.9*    ProBNP (last 3 results) No results for input(s): PROBNP in the last 8760 hours.    Other results:  Imaging: Dg Chest Port 1 View  Result Date: 02/10/2017 CLINICAL DATA:  post insertion to  confirm placement as interpreted by radiologist EXAM: PORTABLE CHEST 1 VIEW COMPARISON:  Chest x-ray dated 08/03/2016. FINDINGS: Right-sided PICC line in place with tip adequately positioned at the level of the lower SVC. Heart size and mediastinal contours are grossly stable given the lordotic patient positioning. Probable small left pleural effusion. Lungs appear otherwise clear. No pneumothorax seen. IMPRESSION: 1. Right-sided PICC line appears appropriately positioned with tip at the level of the lower SVC. 2. Probable small left pleural effusion. Electronically Signed   By: Franki Cabot M.D.   On: 02/10/2017 16:53     Medications:     Scheduled Medications: . apixaban  5 mg Oral BID  . cholecalciferol  1,000 Units Oral Daily  . fenofibrate  160 mg Oral Daily  . insulin aspart  0-15 Units Subcutaneous TID WC  . insulin glargine  5 Units Subcutaneous QHS    . latanoprost  1 drop Left Eye QHS  . metolazone  5 mg Oral Daily  . modafinil  200 mg Oral Daily  . multivitamin with minerals   Oral Daily  . omega-3 acid ethyl esters  2 g Oral BID  . rosuvastatin  10 mg Oral q1800  . sodium chloride flush  10-40 mL Intracatheter Q12H  . sodium chloride flush  3 mL Intravenous Q12H  . spironolactone  12.5 mg Oral Daily    Infusions: . sodium chloride    . amiodarone 30 mg/hr (02/12/17 0046)  . DOBUTamine 5.6 mcg/kg/min (02/11/17 2300)  . furosemide 120 mg (02/12/17 0548)    PRN Medications: sodium chloride, acetaminophen, ALPRAZolam, ondansetron (ZOFRAN) IV, sodium chloride flush, sodium chloride flush   Assessment:   1. Acute on chronic systolic HF with marked volume overload and R>>L symptoms     --Echo 4/18 EF 40-45%. Mild RV dysfunction Moderate MR, moderate to severe TR    --Echo 5/13 EF 20-25%    --Initial co-ox 37%  2. Cardiogenic shock 3. PAF, recurrent    --amio started on admit 4. 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 Acute on CKD, III 6. OSA on CPAP 7. h/o AFL s/p ablation 2015 8. Hyponatremia  Plan/Discussion:    He remains very tenuous. Co-ox very low initially c/w cardiogenic shock. Milrinone switched to dobutamine due to low BP. BP still soft but improved. Will continue current regimen. Creatinine starting to improve. Will continue current regimen with addition of spiro. Supp K. IF BP < 80 will need to go to CCU for norepi.   Remains in AF. Rate improved on amio. Rate stablewith addition of dobutamine. Continue Eliquis. Will need TEE/DC-CV later in week when HF more stable.   Remains very volume overloaded with R>>L symptoms. Continue IV diuresis + metolazone. Renal function now improving with inotropic support.   Repeat echo reviewed personally EF 20-25% RV mild to moderately down     Length of Stay: 3  Bensimhon, Daniel MD 02/12/2017, 6:08 AM  Advanced Heart Failure Team Pager [phone] (M-F; 7a - 4p)  Please contact [facility] for night-coverage after hours (4p -7a ) and weekends on [email or web address]

## 2017-02-12 NOTE — Progress Notes (Signed)
Updated report received via Sully RN in patient's room using SBAR format, updated on new orders, VS, meds and events of the day, assumed care of patient

## 2017-02-12 NOTE — Progress Notes (Signed)
Text paged Cardiology regarding low B/O 84/57, checked manually and I got 80/60, MAP have remained in the mid 60's and above but order states to notify MD if SBP less than 85. MD called right and I gave her an update on patient's history, VS, new meds and the order for when to call MD. Pt's mentation is WNL, VSS, he has been voiding well and has descent palpable pulses, will increase  Dobutamine as per MD and continue to monitor especially since HR is A-Fib 60-80's, will look for increased ectopy and continue to monitor B/P, no other changes noted at this time.

## 2017-02-13 ENCOUNTER — Inpatient Hospital Stay (HOSPITAL_COMMUNITY): Payer: PPO

## 2017-02-13 DIAGNOSIS — M79604 Pain in right leg: Secondary | ICD-10-CM

## 2017-02-13 LAB — BASIC METABOLIC PANEL
ANION GAP: 11 (ref 5–15)
BUN: 43 mg/dL — AB (ref 6–20)
CO2: 26 mmol/L (ref 22–32)
CREATININE: 1.64 mg/dL — AB (ref 0.61–1.24)
Calcium: 8.7 mg/dL — ABNORMAL LOW (ref 8.9–10.3)
Chloride: 91 mmol/L — ABNORMAL LOW (ref 101–111)
GFR calc Af Amer: 47 mL/min — ABNORMAL LOW (ref >60)
GFR, EST NON AFRICAN AMERICAN: 40 mL/min — AB (ref >60)
GLUCOSE: 109 mg/dL — AB (ref 65–99)
Potassium: 3.2 mmol/L — ABNORMAL LOW (ref 3.5–5.1)
SODIUM: 128 mmol/L — AB (ref 135–145)

## 2017-02-13 LAB — CBC
HEMATOCRIT: 38.3 % — AB (ref 39.0–52.0)
Hemoglobin: 13.3 g/dL (ref 13.0–17.0)
MCH: 30.9 pg (ref 26.0–34.0)
MCHC: 34.7 g/dL (ref 30.0–36.0)
MCV: 89.1 fL (ref 78.0–100.0)
Platelets: 112 10*3/uL — ABNORMAL LOW (ref 150–400)
RBC: 4.3 MIL/uL (ref 4.22–5.81)
RDW: 19.7 % — AB (ref 11.5–15.5)
WBC: 5.8 10*3/uL (ref 4.0–10.5)

## 2017-02-13 LAB — GLUCOSE, CAPILLARY
GLUCOSE-CAPILLARY: 148 mg/dL — AB (ref 65–99)
GLUCOSE-CAPILLARY: 179 mg/dL — AB (ref 65–99)
GLUCOSE-CAPILLARY: 144 mg/dL — AB (ref 65–99)
Glucose-Capillary: 137 mg/dL — ABNORMAL HIGH (ref 65–99)
Glucose-Capillary: 118 mg/dL — ABNORMAL HIGH (ref 65–99)

## 2017-02-13 LAB — COOXEMETRY PANEL
Carboxyhemoglobin: 0.7 % (ref 0.5–1.5)
Carboxyhemoglobin: 0.8 % (ref 0.5–1.5)
METHEMOGLOBIN: 0.9 % (ref 0.0–1.5)
Methemoglobin: 0.9 % (ref 0.0–1.5)
O2 Saturation: 48.1 %
O2 Saturation: 55 %
Total hemoglobin: 13.6 g/dL (ref 12.0–16.0)
Total hemoglobin: 13.5 g/dL (ref 12.0–16.0)

## 2017-02-13 MED ORDER — NOREPINEPHRINE BITARTRATE 1 MG/ML IV SOLN
5.0000 ug/min | INTRAVENOUS | Status: DC
  Filled 2017-02-13: qty 4

## 2017-02-13 MED ORDER — NOREPINEPHRINE BITARTRATE 1 MG/ML IV SOLN
4.0000 ug/min | INTRAVENOUS | Status: DC
  Administered 2017-02-13: 4 ug/min via INTRAVENOUS
  Filled 2017-02-13: qty 16

## 2017-02-13 MED ORDER — POTASSIUM CHLORIDE CRYS ER 20 MEQ PO TBCR
40.0000 meq | EXTENDED_RELEASE_TABLET | Freq: Once | ORAL | Status: AC
  Administered 2017-02-13: 40 meq via ORAL
  Filled 2017-02-13: qty 2

## 2017-02-13 NOTE — Progress Notes (Signed)
Advanced Heart Failure Rounding Note   Subjective:    PICC placed on 5/12.  Milrinone switched to dobutamine yesterday for low BP. Co ox down to 48% today.   Weight down 3 pounds from yesterday, CVP elevated at 22.   Fatigued, SOB with activity.   Objective:   Weight Range:  Vital Signs:   Temp:  [97 F (36.1 C)-97.8 F (36.6 C)] 97.8 F (36.6 C) (05/15 0349) Pulse Rate:  [72-76] 72 (05/15 0349) Resp:  [16-23] 20 (05/15 0550) BP: (86-106)/(43-87) 89/75 (05/15 0550) SpO2:  [95 %-96 %] 95 % (05/15 0349) Weight:  [221 lb 8 oz (100.5 kg)] 221 lb 8 oz (100.5 kg) (05/15 0349) Last BM Date: 02/11/17  Weight change: Filed Weights   02/11/17 1000 02/12/17 0345 02/13/17 0349  Weight: 226 lb 11.2 oz (102.8 kg) 224 lb 14.4 oz (102 kg) 221 lb 8 oz (100.5 kg)    Intake/Output:   Intake/Output Summary (Last 24 hours) at 02/13/17 0939 Last data filed at 02/13/17 0600  Gross per 24 hour  Intake          1769.35 ml  Output             3750 ml  Net         -1980.65 ml     Physical Exam: General: Male, NAD. Sitting on edge of bed.  HEENT: normal Neck: supple. JVP to jaw.  Carotids 2+ bilat; no bruits. No lymphadenopathy or thryomegaly appreciated. Cor: PMI laterally displaced. Irregularly irregular rhythm. No murmurs, rubs or gallops.  Lungs: Fine crackles bilaterally in bases. Normal effort.  Abdomen: soft, nontender, + distended. No hepatosplenomegaly. No bruits or masses. Good bowel sounds. Extremities: no cyanosis, clubbing, rash, 3-4+ edema to thighs. Cool  Neuro: alert & orientedx3, cranial nerves grossly intact. moves all 4 extremities w/o difficulty. Affect pleasant   Telemetry:  AF 60s. Personally reviewed     Labs: Basic Metabolic Panel:  Recent Labs Lab 02/09/17 1151 02/10/17 0414 02/11/17 0445 02/12/17 0500 02/13/17 0400  NA 130* 128* 130* 130* 128*  K 5.2* 4.4 4.0 3.1* 3.2*  CL 97* 96* 95* 92* 91*  CO2 23 20* 24 26 26   GLUCOSE 94 120* 98 117*  109*  BUN 47* 48* 47* 47* 43*  CREATININE 1.51* 1.55* 1.78* 1.72* 1.64*  CALCIUM 8.7* 8.8* 8.5* 8.5* 8.7*  MG 2.0  --   --   --   --     Liver Function Tests:  Recent Labs Lab 02/09/17 1151  AST 77*  ALT 32  ALKPHOS 37*  BILITOT 2.7*  PROT 5.9*  ALBUMIN 3.0*   No results for input(s): LIPASE, AMYLASE in the last 168 hours. No results for input(s): AMMONIA in the last 168 hours.  CBC:  Recent Labs Lab 02/09/17 1151 02/10/17 0414 02/11/17 0445 02/12/17 0500 02/13/17 0400  WBC 6.9 7.3 6.6 6.1 5.8  HGB 14.3 14.8 13.8 13.3 13.3  HCT 41.1 42.7 39.9 38.2* 38.3*  MCV 91.1 91.0 90.1 89.5 89.1  PLT 137* 138* 133* 105* 112*    Cardiac Enzymes:  Recent Labs Lab 02/09/17 1151  TROPONINI 0.96*    BNP: BNP (last 3 results)  Recent Labs  08/03/16 1427 12/13/16 1051 02/09/17 1151  BNP 485.2* 1,497.5* 2,071.9*    ProBNP (last 3 results) No results for input(s): PROBNP in the last 8760 hours.    Other results:  Imaging: No results found.   Medications:     Scheduled Medications: . apixaban  5 mg Oral BID  . cholecalciferol  1,000 Units Oral Daily  . fenofibrate  160 mg Oral Daily  . insulin aspart  0-15 Units Subcutaneous TID WC  . insulin glargine  5 Units Subcutaneous QHS  . latanoprost  1 drop Left Eye QHS  . metolazone  5 mg Oral Daily  . modafinil  200 mg Oral Daily  . multivitamin with minerals   Oral Daily  . omega-3 acid ethyl esters  2 g Oral BID  . potassium chloride  40 mEq Oral BID  . rosuvastatin  10 mg Oral q1800  . sodium chloride flush  10-40 mL Intracatheter Q12H  . sodium chloride flush  3 mL Intravenous Q12H  . spironolactone  25 mg Oral Daily    Infusions: . sodium chloride    . amiodarone 30 mg/hr (02/13/17 0105)  . DOBUTamine 5 mcg/kg/min (02/12/17 1430)  . furosemide Stopped (02/13/17 3295)    PRN Medications: sodium chloride, acetaminophen, ALPRAZolam, ondansetron (ZOFRAN) IV, sodium chloride flush, sodium chloride  flush   Assessment:   1. Acute on chronic systolic HF with marked volume overload and R>>L symptoms     --Echo 4/18 EF 40-45%. Mild RV dysfunction Moderate MR, moderate to severe TR    --Echo 5/13 EF 20-25%    --Initial co-ox 37%  2. Cardiogenic shock 3. PAF, recurrent    --amio started on admit 4. 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 Acute on CKD, III 6. OSA on CPAP 7. h/o AFL s/p ablation 2015 8. Hyponatremia  Plan/Discussion:    Still with marked volume overload in the setting of R>L heart failure. Milrinone stopped due to low BP, started dobutamine at 45mcg yesterday, but co ox reduced to 48% today. Narrow pulse pressures indicating elevated SVR, cardiogenic shock. Will move to ICU to start Levophed at 54mcg. Keep dobutamine infusing for now. Continue IV lasix at 160mg  q 8 + metolazone.   PAF stable, rate controlled. Continue Eliquis.    Length of Stay: [place] MD 02/13/2017, 9:39 AM  Advanced Heart Failure Team Pager [phone] (M-F; 7a - 4p)  Please contact [facility] for night-coverage after hours (4p -7a ) and weekends on [email or web address]  Patient seen and examined with Jettie Booze, NP. We discussed all aspects of the encounter. I agree with the assessment and plan as stated above.   He remains very tenuous with cardiogenic shock and marked volume overload with R>>L HF despite dobutamine support. BP low. Will transfer to ICU and add norepi to support BP and cardiac output.   Long talk with him and his wife about the tenuous nature of his condition. They confirm desire for full code at this time.   Glori Bickers, MD  10:15 PM

## 2017-02-13 NOTE — Care Management Important Message (Signed)
Important Message  Patient Details  Name: Tommy Sullivan MRN: [id number] Date of Birth: 01/29/1946   Medicare Important Message Given:  Yes    Garrette Caine Abena 02/13/2017, 11:18 AM

## 2017-02-13 NOTE — Care Management Note (Addendum)
Case Management Note  Patient Details  Name: Tommy Sullivan MRN: [id number] Date of Birth: [birth year]  Subjective/Objective: Pt presented for Acute on Chronic Systolic Heart Failure- with volume overload. Pt initiated on IV Lasix and Dobutamine gtt. Pt is from home with wife. Pt transferred to ICU 02-13-17 for Levophed gtt.                    Action/Plan: CM will continue to monitor for additional disposition needs.   Expected Discharge Date:                  Expected Discharge Plan:  [place]  In-House Referral:  NA  Discharge planning Services  CM Consult  Post Acute Care Choice:    Choice offered to:     DME Arranged:    DME Agency:     HH Arranged:    HH Agency:     Status of Service:  In process, will continue to follow  If discussed at Long Length of Stay Meetings, dates discussed:    Additional Comments:  Bethena Roys, RN 02/13/2017, 3:08 PM

## 2017-02-13 NOTE — Progress Notes (Signed)
Updated report received via Nira Conn RN in patient's room using SBAR format, reviewed events of the day, VS, new orders, assumed care of patient.

## 2017-02-13 NOTE — Progress Notes (Signed)
VASCULAR LAB PRELIMINARY  ARTERIAL  ABI completed: Normal ABI at rest.     RIGHT    LEFT    PRESSURE WAVEFORM  PRESSURE WAVEFORM  BRACHIAL  Triphasic BRACHIAL 79 Triphasic  DP 82 biphasic DP 94 Trriphasic  PT 93 triphasic PT 92 triphasic  GREAT TOE  NA GREAT TOE  NA    RIGHT LEFT  ABI 1.1 1.1     Tommy Sullivan D, RVT 02/13/2017, 12:32 PM

## 2017-02-13 NOTE — Progress Notes (Signed)
Text paged Heart Care fellow regarding patient's low Potassium and he had a 7 beat run of V-tach, asymptomatic, he was sleeping but his Potassium level was 3.2 and he has Potassium 40 meqs due at 10 AM, will give now and continue to monitor.

## 2017-02-14 ENCOUNTER — Ambulatory Visit: Payer: PPO | Admitting: Physical Therapy

## 2017-02-14 LAB — BASIC METABOLIC PANEL
ANION GAP: 11 (ref 5–15)
BUN: 40 mg/dL — ABNORMAL HIGH (ref 6–20)
CHLORIDE: 92 mmol/L — AB (ref 101–111)
CO2: 27 mmol/L (ref 22–32)
Calcium: 8.5 mg/dL — ABNORMAL LOW (ref 8.9–10.3)
Creatinine, Ser: 1.39 mg/dL — ABNORMAL HIGH (ref 0.61–1.24)
GFR calc non Af Amer: 49 mL/min — ABNORMAL LOW (ref >60)
GFR, EST AFRICAN AMERICAN: 57 mL/min — AB (ref >60)
Glucose, Bld: 160 mg/dL — ABNORMAL HIGH (ref 65–99)
POTASSIUM: 3.3 mmol/L — AB (ref 3.5–5.1)
SODIUM: 130 mmol/L — AB (ref 135–145)

## 2017-02-14 LAB — COOXEMETRY PANEL
CARBOXYHEMOGLOBIN: 1.3 % (ref 0.5–1.5)
METHEMOGLOBIN: 0.7 % (ref 0.0–1.5)
O2 Saturation: 59.3 %
Total hemoglobin: 14.1 g/dL (ref 12.0–16.0)

## 2017-02-14 LAB — GLUCOSE, CAPILLARY
GLUCOSE-CAPILLARY: 112 mg/dL — AB (ref 65–99)
Glucose-Capillary: 130 mg/dL — ABNORMAL HIGH (ref 65–99)
Glucose-Capillary: 164 mg/dL — ABNORMAL HIGH (ref 65–99)
Glucose-Capillary: 163 mg/dL — ABNORMAL HIGH (ref 65–99)

## 2017-02-14 LAB — CBC
HEMATOCRIT: 39.5 % (ref 39.0–52.0)
Hemoglobin: 13.8 g/dL (ref 13.0–17.0)
MCH: 31.2 pg (ref 26.0–34.0)
MCHC: 34.9 g/dL (ref 30.0–36.0)
MCV: 89.4 fL (ref 78.0–100.0)
PLATELETS: 118 10*3/uL — AB (ref 150–400)
RBC: 4.42 MIL/uL (ref 4.22–5.81)
RDW: 19.6 % — ABNORMAL HIGH (ref 11.5–15.5)
WBC: 6.5 10*3/uL (ref 4.0–10.5)

## 2017-02-14 LAB — MAGNESIUM: Magnesium: 1.8 mg/dL (ref 1.7–2.4)

## 2017-02-14 LAB — MRSA PCR SCREENING: MRSA by PCR: NEGATIVE

## 2017-02-14 MED ORDER — POLYETHYLENE GLYCOL 3350 17 G PO PACK
17.0000 g | PACK | Freq: Every day | ORAL | Status: DC | PRN
  Administered 2017-02-14 – 2017-02-17 (×3): 17 g via ORAL
  Filled 2017-02-14 (×3): qty 1

## 2017-02-14 MED ORDER — CHLORHEXIDINE GLUCONATE CLOTH 2 % EX PADS
6.0000 | MEDICATED_PAD | Freq: Every day | CUTANEOUS | Status: DC
  Administered 2017-02-14 – 2017-02-19 (×6): 6 via TOPICAL

## 2017-02-14 MED ORDER — NOREPINEPHRINE BITARTRATE 1 MG/ML IV SOLN
0.0000 ug/min | INTRAVENOUS | Status: DC
  Administered 2017-02-15: 6 ug/min via INTRAVENOUS
  Administered 2017-02-17: 10 ug/min via INTRAVENOUS
  Administered 2017-02-18 (×2): 15 ug/min via INTRAVENOUS
  Filled 2017-02-14 (×6): qty 16

## 2017-02-14 MED ORDER — MAGNESIUM SULFATE 2 GM/50ML IV SOLN
2.0000 g | Freq: Once | INTRAVENOUS | Status: AC
  Administered 2017-02-14: 2 g via INTRAVENOUS
  Filled 2017-02-14: qty 50

## 2017-02-14 NOTE — Progress Notes (Signed)
Advanced Heart Failure Rounding Note   Subjective:    PICC placed on 5/12. Moved to ICU 5/15 for worsening shock  Now on dual inotropes with dobutamine 40mcg + norepinephrine 5 mcg.   Co ox 59. Diuresing well on 120mg  Lasix q 8 hours + Metolazone 5mg  daily.  Weight down 4 pounds, 2.3L urine out overnight. CVP 14. Creatinine 1.64->1.34.   Feeling a bit better. Less fatigued. Denies CP, orthopnea or PND. Still weak.   Objective:     Vital Signs:   Temp:  [97.4 F (36.3 C)-97.8 F (36.6 C)] 97.8 F (36.6 C) (05/16 0345) Pulse Rate:  [39-105] 72 (05/16 0700) Resp:  [12-26] 13 (05/16 0700) BP: (77-103)/(55-80) 77/59 (05/16 0700) SpO2:  [94 %-100 %] 97 % (05/16 0700) Weight:  [218 lb 12.8 oz (99.2 kg)-222 lb 10.6 oz (101 kg)] 218 lb 12.8 oz (99.2 kg) (05/16 0345) Last BM Date: 02/11/17  Weight change: Filed Weights   02/13/17 0349 02/13/17 1359 02/14/17 0345  Weight: 221 lb 8 oz (100.5 kg) 222 lb 10.6 oz (101 kg) 218 lb 12.8 oz (99.2 kg)    Intake/Output:   Intake/Output Summary (Last 24 hours) at 02/14/17 0714 Last data filed at 02/14/17 0700  Gross per 24 hour  Intake          1509.17 ml  Output             4100 ml  Net         -2590.83 ml     Physical Exam: CVP 14 General: Male, NAD. Lying in bed.  HEENT: normal Neck: supple. JVP 11-12.  Carotids 2+ bilat; no bruits. No lymphadenopathy or thryomegaly appreciated. Cor: PMI laterally displaced.. Irregularly irregular rhythm. No murmurs, rubs or gallops.  Lungs: Normal effort, no wheeze. Clear in upper lobes bilaterally. Diminished crackles in bases.  Abdomen: soft, nontender, + distended. . No hepatosplenomegaly. No bruits or masses. Good bowel sounds. Extremities: no cyanosis, clubbing, rash, 3-4+ edema to thighs. R upper arm PICC.  Neuro: A& O x 3. cranial nerves grossly intact. moves all 4 extremities w/o difficulty. Affect pleasant   Telemetry:  Atrial fib rates in the 70-80's.   Labs: Basic Metabolic  Panel:  Recent Labs Lab 02/09/17 1151 02/10/17 0414 02/11/17 0445 02/12/17 0500 02/13/17 0400 02/14/17 0346  NA 130* 128* 130* 130* 128* 130*  K 5.2* 4.4 4.0 3.1* 3.2* 3.3*  CL 97* 96* 95* 92* 91* 92*  CO2 23 20* 24 26 26 27   GLUCOSE 94 120* 98 117* 109* 160*  BUN 47* 48* 47* 47* 43* 40*  CREATININE 1.51* 1.55* 1.78* 1.72* 1.64* 1.39*  CALCIUM 8.7* 8.8* 8.5* 8.5* 8.7* 8.5*  MG 2.0  --   --   --   --  1.8    Liver Function Tests:  Recent Labs Lab 02/09/17 1151  AST 77*  ALT 32  ALKPHOS 37*  BILITOT 2.7*  PROT 5.9*  ALBUMIN 3.0*   No results for input(s): LIPASE, AMYLASE in the last 168 hours. No results for input(s): AMMONIA in the last 168 hours.  CBC:  Recent Labs Lab 02/10/17 0414 02/11/17 0445 02/12/17 0500 02/13/17 0400 02/14/17 0346  WBC 7.3 6.6 6.1 5.8 6.5  HGB 14.8 13.8 13.3 13.3 13.8  HCT 42.7 39.9 38.2* 38.3* 39.5  MCV 91.0 90.1 89.5 89.1 89.4  PLT 138* 133* 105* 112* 118*    Cardiac Enzymes:  Recent Labs Lab 02/09/17 1151  TROPONINI 0.96*    BNP: BNP (  last 3 results)  Recent Labs  08/03/16 1427 12/13/16 1051 02/09/17 1151  BNP 485.2* 1,497.5* 2,071.9*    ProBNP (last 3 results) No results for input(s): PROBNP in the last 8760 hours.     Medications:     Scheduled Medications: . apixaban  5 mg Oral BID  . Chlorhexidine Gluconate Cloth  6 each Topical Q0600  . cholecalciferol  1,000 Units Oral Daily  . fenofibrate  160 mg Oral Daily  . insulin aspart  0-15 Units Subcutaneous TID WC  . insulin glargine  5 Units Subcutaneous QHS  . latanoprost  1 drop Left Eye QHS  . metolazone  5 mg Oral Daily  . modafinil  200 mg Oral Daily  . multivitamin with minerals   Oral Daily  . omega-3 acid ethyl esters  2 g Oral BID  . potassium chloride  40 mEq Oral BID  . rosuvastatin  10 mg Oral q1800  . sodium chloride flush  10-40 mL Intracatheter Q12H  . sodium chloride flush  3 mL Intravenous Q12H  . spironolactone  25 mg Oral  Daily    Infusions: . sodium chloride    . amiodarone 30 mg/hr (02/14/17 0000)  . DOBUTamine 5 mcg/kg/min (02/13/17 2123)  . furosemide Stopped (02/14/17 0707)  . norepinephrine (LEVOPHED) Adult infusion 4 mcg/min (02/13/17 2000)    PRN Medications: sodium chloride, acetaminophen, ALPRAZolam, ondansetron (ZOFRAN) IV, sodium chloride flush, sodium chloride flush   Assessment:   1. Acute on chronic systolic HF with marked volume overload and R>>L symptoms     --Echo 4/18 EF 40-45%. Mild RV dysfunction Moderate MR, moderate to severe TR    --Echo 5/13 EF 20-25%    --Initial co-ox 37%  2. Cardiogenic shock 3. PAF, recurrent    --amio started on admit 4. 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 Acute on CKD, III 6. OSA on CPAP 7. h/o AFL s/p ablation 2015 8. Hyponatremia  Plan/Discussion:    Volume status improving, but remains volume overloaded on exam. Co ox improved this am at 59. On 50mcg of Levo, SBP's remain in the 80's. Change order to titrate Levo for SBP >90.   Will replete K, give 2 gm of Mg. Creatinine improving today. Place unna boots today.   Remains in Afib, rate controlled. Continue po Amio. Overall status remains tenuous with marked volume overload and hypotension.   Length of Stay: [place], NP-C Advanced Heart Failure Team  Pager [phone] M-F 7am-4pm.  Please contact [facility] [place] for night-coverage after hours (4p -7a ) and weekends on [email or web address]  Agree.  Remains critically ill with cardiogenic shock. Moved to ICU. Now on dobutamine and norepi.   Co-ox and BP remain marginal but beginning to diurese. CVP finally coming down. Not acidotic. Renal function slightly improved  On exam He is sitting in chair. Able to speak in full sentences JVP to ear Cor IRR + s3 Lungs dull at bases Ab distended. Good BS Ext: slightly mottled. Cool. 2-3+ edema  Overall very tenuous but it looks like we are beginning to make progress. Will continue dual inotropes and IV  lasix. Wrap LEs. Will need TEE/DC-CV when more stable. Remains to be seen if we can tune him up enough for VAD.   CRITICAL CARE Performed by: Glori Bickers  Total critical care time: 35 minutes  Critical care time was exclusive of separately billable procedures and treating other patients.  Critical care was necessary to treat or prevent imminent or life-threatening deterioration.  Critical care was time spent personally by me (independent of midlevel providers or residents) on the following activities: development of treatment plan with patient and/or surrogate as well as nursing, discussions with consultants, evaluation of patient's response to treatment, examination of patient, obtaining history from patient or surrogate, ordering and performing treatments and interventions, ordering and review of laboratory studies, ordering and review of radiographic studies, pulse oximetry and re-evaluation of patient's condition.  Glori Bickers, MD  9:51 PM

## 2017-02-15 LAB — COOXEMETRY PANEL
CARBOXYHEMOGLOBIN: 0.7 % (ref 0.5–1.5)
CARBOXYHEMOGLOBIN: 1.6 % — AB (ref 0.5–1.5)
Carboxyhemoglobin: 1.4 % (ref 0.5–1.5)
METHEMOGLOBIN: 0.9 % (ref 0.0–1.5)
Methemoglobin: 0.6 % (ref 0.0–1.5)
Methemoglobin: 0.9 % (ref 0.0–1.5)
O2 SAT: 47.9 %
O2 SAT: 84.1 %
O2 Saturation: 56.5 %
TOTAL HEMOGLOBIN: 14.3 g/dL (ref 12.0–16.0)
TOTAL HEMOGLOBIN: 4.4 g/dL — AB (ref 12.0–16.0)
Total hemoglobin: 14.1 g/dL (ref 12.0–16.0)

## 2017-02-15 LAB — BASIC METABOLIC PANEL
Anion gap: 11 (ref 5–15)
BUN: 37 mg/dL — ABNORMAL HIGH (ref 6–20)
CALCIUM: 8.4 mg/dL — AB (ref 8.9–10.3)
CHLORIDE: 89 mmol/L — AB (ref 101–111)
CO2: 28 mmol/L (ref 22–32)
CREATININE: 1.44 mg/dL — AB (ref 0.61–1.24)
GFR calc Af Amer: 55 mL/min — ABNORMAL LOW (ref >60)
GFR calc non Af Amer: 47 mL/min — ABNORMAL LOW (ref >60)
GLUCOSE: 198 mg/dL — AB (ref 65–99)
Potassium: 3.6 mmol/L (ref 3.5–5.1)
Sodium: 128 mmol/L — ABNORMAL LOW (ref 135–145)

## 2017-02-15 LAB — GLUCOSE, CAPILLARY
GLUCOSE-CAPILLARY: 133 mg/dL — AB (ref 65–99)
GLUCOSE-CAPILLARY: 195 mg/dL — AB (ref 65–99)
GLUCOSE-CAPILLARY: 148 mg/dL — AB (ref 65–99)
GLUCOSE-CAPILLARY: 124 mg/dL — AB (ref 65–99)

## 2017-02-15 MED ORDER — METOLAZONE 5 MG PO TABS
5.0000 mg | ORAL_TABLET | Freq: Two times a day (BID) | ORAL | Status: DC
  Administered 2017-02-15 – 2017-02-19 (×9): 5 mg via ORAL
  Filled 2017-02-15 (×9): qty 1

## 2017-02-15 MED ORDER — DOBUTAMINE IN D5W 4-5 MG/ML-% IV SOLN
7.5000 ug/kg/min | INTRAVENOUS | Status: DC
  Administered 2017-02-16: 7.5 ug/kg/min via INTRAVENOUS
  Filled 2017-02-15: qty 250

## 2017-02-15 NOTE — Progress Notes (Signed)
   02/15/17 1505  Clinical Encounter Type  Visited With Patient  Visit Type Initial  Referral From Patient  Consult/Referral To Chaplain  Recommendations (follow up )  Spiritual Encounters  Spiritual Needs Literature;Brochure  Stress Factors  Patient Stress Factors None identified  Advance Directives (For Healthcare)  Does Patient Have a Medical Advance Directive? No  Does patient want to make changes to medical advance directive? Yes (Inpatient - patient requests chaplain consult to change a medical advance directive)  Type of Advance Directive [place] in Chart? No - copy requested  Pt. Has completed paperwork but wants to wait until tomorrow when wife is present, no notary and volunteers available at end of day.  Will follow up with pt. And wife first tomorrow morning to complete.  Offered a ministry of presence and active listening.  Chaplain Caydin Yeatts A. Nonnie Done, MA-PC, [phone]

## 2017-02-15 NOTE — Progress Notes (Addendum)
Advanced Heart Failure Rounding Note   Subjective:    PICC placed on 5/12. Moved to ICU 5/15 for worsening shock  Remains on dual inotropes with dobutamine 37mcg + norepinephrine 6 mcg.   Co ox back down this am to 48%.  Continues to diurese on 120mg  Lasix q 8 hours + Metolazone 5mg  daily.  Weight unchanged overnight. CVP 14. Creatinine 1.34-> 1.44. CVP 18-19.   Remains weak and fatigued.   Objective:     Vital Signs:   Temp:  [97.6 F (36.4 C)-98 F (36.7 C)] 97.9 F (36.6 C) (05/17 0731) Pulse Rate:  [68-114] 76 (05/17 0700) Resp:  [12-28] 14 (05/17 0600) BP: (84-101)/(60-83) 94/75 (05/17 0700) SpO2:  [94 %-100 %] 100 % (05/17 0700) Weight:  [99 kg (218 lb 3.2 oz)] 99 kg (218 lb 3.2 oz) (05/17 0334) Last BM Date: 02/11/17  Weight change: Filed Weights   02/13/17 1359 02/14/17 0345 02/15/17 0334  Weight: 101 kg (222 lb 10.6 oz) 99.2 kg (218 lb 12.8 oz) 99 kg (218 lb 3.2 oz)    Intake/Output:   Intake/Output Summary (Last 24 hours) at 02/15/17 0800 Last data filed at 02/15/17 0706  Gross per 24 hour  Intake           919.78 ml  Output             3875 ml  Net         -2955.22 ml     Physical Exam: CVP 14 General:  Fatigued appearing. Sitting I chair No resp difficulty HEENT: normal Neck: supple. no JVD. Carotids 2+ bilat; no bruits. No lymphadenopathy or thryomegaly appreciated. Cor: PMI laterally displaced. IRR, IRR + s3 Lungs: clear. Decreased at bases  Abdomen: soft, nontender, mildly distended. No hepatosplenomegaly. No bruits or masses. Good bowel sounds. Extremities: no cyanosis, clubbing, rash, 3-4+ edema into thighs + UNNA boots Neuro: alert & orientedx3, cranial nerves grossly intact. moves all 4 extremities w/o difficulty. Affect pleasant    Telemetry:  AF 70-80s Personally reviewed   Labs: Basic Metabolic Panel:  Recent Labs Lab 02/09/17 1151  02/11/17 0445 02/12/17 0500 02/13/17 0400 02/14/17 0346 02/15/17 0324  NA 130*  < > 130*  130* 128* 130* 128*  K 5.2*  < > 4.0 3.1* 3.2* 3.3* 3.6  CL 97*  < > 95* 92* 91* 92* 89*  CO2 23  < > 24 26 26 27 28   GLUCOSE 94  < > 98 117* 109* 160* 198*  BUN 47*  < > 47* 47* 43* 40* 37*  CREATININE 1.51*  < > 1.78* 1.72* 1.64* 1.39* 1.44*  CALCIUM 8.7*  < > 8.5* 8.5* 8.7* 8.5* 8.4*  MG 2.0  --   --   --   --  1.8  --   < > = values in this interval not displayed.  Liver Function Tests:  Recent Labs Lab 02/09/17 1151  AST 77*  ALT 32  ALKPHOS 37*  BILITOT 2.7*  PROT 5.9*  ALBUMIN 3.0*   No results for input(s): LIPASE, AMYLASE in the last 168 hours. No results for input(s): AMMONIA in the last 168 hours.  CBC:  Recent Labs Lab 02/10/17 0414 02/11/17 0445 02/12/17 0500 02/13/17 0400 02/14/17 0346  WBC 7.3 6.6 6.1 5.8 6.5  HGB 14.8 13.8 13.3 13.3 13.8  HCT 42.7 39.9 38.2* 38.3* 39.5  MCV 91.0 90.1 89.5 89.1 89.4  PLT 138* 133* 105* 112* 118*    Cardiac Enzymes:  Recent Labs  Lab 02/09/17 1151  TROPONINI 0.96*    BNP: BNP (last 3 results)  Recent Labs  08/03/16 1427 12/13/16 1051 02/09/17 1151  BNP 485.2* 1,497.5* 2,071.9*    ProBNP (last 3 results) No results for input(s): PROBNP in the last 8760 hours.     Medications:     Scheduled Medications: . apixaban  5 mg Oral BID  . Chlorhexidine Gluconate Cloth  6 each Topical Q0600  . cholecalciferol  1,000 Units Oral Daily  . fenofibrate  160 mg Oral Daily  . insulin aspart  0-15 Units Subcutaneous TID WC  . insulin glargine  5 Units Subcutaneous QHS  . latanoprost  1 drop Left Eye QHS  . metolazone  5 mg Oral Daily  . modafinil  200 mg Oral Daily  . multivitamin with minerals   Oral Daily  . omega-3 acid ethyl esters  2 g Oral BID  . potassium chloride  40 mEq Oral BID  . rosuvastatin  10 mg Oral q1800  . sodium chloride flush  10-40 mL Intracatheter Q12H  . sodium chloride flush  3 mL Intravenous Q12H  . spironolactone  25 mg Oral Daily    Infusions: . sodium chloride    .  amiodarone 30 mg/hr (02/14/17 2358)  . DOBUTamine 5 mcg/kg/min (02/15/17 0326)  . furosemide Stopped (02/15/17 0706)  . norepinephrine (LEVOPHED) Adult infusion 6 mcg/min (02/15/17 0415)    PRN Medications: sodium chloride, acetaminophen, ALPRAZolam, ondansetron (ZOFRAN) IV, polyethylene glycol, sodium chloride flush, sodium chloride flush   Assessment:   1. Acute on chronic systolic HF with marked volume overload and R>>L symptoms     --Echo 4/18 EF 40-45%. Mild RV dysfunction Moderate MR, moderate to severe TR    --Echo 5/13 EF 20-25%RV severely down     --Initial co-ox 37%  2. Cardiogenic shock 3. PAF, recurrent    --amio started on admit 4. 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 Acute on CKD, III 6. OSA on CPAP 7. h/o AFL s/p ablation 2015 8. Hyponatremia  Plan/Discussion:    Remains critically ill with cardiogenic shock with biventricular HF. Now on dobutamine and norepi. Co-ox continues to fall.   BP remains soft despite dual pressors/inotropes. I/O negative but weight unchanged. Still with marked edema. Creatinine relatively stable.  Remains in AF but rate control. Will need TEE/DC-CV when more stable.   Will repeat co-ox. Titrate inotropes as tolerated Can consider short-term IABP.   Remains to be seen if we can tune him up enough for VAD.   Remains on apixaban. If needs IABP will need to switch to heparin. Discussed with PharmD.    CRITICAL CARE Performed by: Glori Bickers  Total critical care time: 35 minutes  Critical care time was exclusive of separately billable procedures and treating other patients.  Critical care was necessary to treat or prevent imminent or life-threatening deterioration.  Critical care was time spent personally by me (independent of midlevel providers or residents) on the following activities: development of treatment plan with patient and/or surrogate as well as nursing, discussions with consultants, evaluation of patient's response to  treatment, examination of patient, obtaining history from patient or surrogate, ordering and performing treatments and interventions, ordering and review of laboratory studies, ordering and review of radiographic studies, pulse oximetry and re-evaluation of patient's condition.    Glori Bickers, MD  8:00 AM

## 2017-02-15 NOTE — Progress Notes (Signed)
CoOx 47.9 this AM. Koleen Nimrod, MD paged and notified. Patient asymptomatic. No new orders given at this time.

## 2017-02-16 LAB — BASIC METABOLIC PANEL
ANION GAP: 13 (ref 5–15)
BUN: 38 mg/dL — ABNORMAL HIGH (ref 6–20)
CO2: 28 mmol/L (ref 22–32)
CREATININE: 1.48 mg/dL — AB (ref 0.61–1.24)
Calcium: 8.8 mg/dL — ABNORMAL LOW (ref 8.9–10.3)
Chloride: 86 mmol/L — ABNORMAL LOW (ref 101–111)
GFR calc Af Amer: 53 mL/min — ABNORMAL LOW (ref >60)
GFR, EST NON AFRICAN AMERICAN: 46 mL/min — AB (ref >60)
GLUCOSE: 145 mg/dL — AB (ref 65–99)
Potassium: 3.2 mmol/L — ABNORMAL LOW (ref 3.5–5.1)
Sodium: 127 mmol/L — ABNORMAL LOW (ref 135–145)

## 2017-02-16 LAB — GLUCOSE, CAPILLARY
GLUCOSE-CAPILLARY: 147 mg/dL — AB (ref 65–99)
GLUCOSE-CAPILLARY: 117 mg/dL — AB (ref 65–99)
GLUCOSE-CAPILLARY: 158 mg/dL — AB (ref 65–99)
Glucose-Capillary: 129 mg/dL — ABNORMAL HIGH (ref 65–99)

## 2017-02-16 LAB — COOXEMETRY PANEL
CARBOXYHEMOGLOBIN: 1 % (ref 0.5–1.5)
Methemoglobin: 1 % (ref 0.0–1.5)
O2 Saturation: 66.2 %
Total hemoglobin: 13.5 g/dL (ref 12.0–16.0)

## 2017-02-16 MED ORDER — MILRINONE LACTATE IN DEXTROSE 20-5 MG/100ML-% IV SOLN: 0.2500 ug/kg/min | INTRAVENOUS | Status: DC

## 2017-02-16 MED ORDER — DOBUTAMINE IN D5W 4-5 MG/ML-% IV SOLN
5.0000 ug/kg/min | INTRAVENOUS | Status: DC
  Administered 2017-02-17 (×2): 7.5 ug/kg/min via INTRAVENOUS
  Administered 2017-02-18: 5 ug/kg/min via INTRAVENOUS
  Filled 2017-02-16 (×4): qty 250

## 2017-02-16 MED ORDER — FUROSEMIDE 10 MG/ML IJ SOLN
25.0000 mg/h | INTRAMUSCULAR | Status: DC
Start: 2017-02-16 — End: 2017-02-20
  Administered 2017-02-16 – 2017-02-17 (×4): 30 mg/h via INTRAVENOUS
  Administered 2017-02-18: 20 mg/h via INTRAVENOUS
  Administered 2017-02-18 (×2): 30 mg/h via INTRAVENOUS
  Administered 2017-02-19: 20 mg/h via INTRAVENOUS
  Filled 2017-02-16 (×20): qty 25

## 2017-02-16 NOTE — Consult Note (Signed)
   [facility] Inpatient Consult   02/16/2017  FREDRICO BEEDLE [birth year] [id number]     Screened for [facility] Care Management program services. Chart reviewed. Mr. Stoklosa remains in ICU. Will continue to follow and engage for [place] Management program services when appropriate. Discussed above with inpatient RNCM.  Marthenia Rolling, MSN-Ed, RN,BSN [facility] Liaison [phone]

## 2017-02-16 NOTE — Progress Notes (Signed)
Advanced Heart Failure Rounding Note   Subjective:    PICC placed on 5/12. Moved to ICU 5/15 for worsening shock  On dual inotropes with dobutamine 7.5 mcg + Levo 93mcg.   Co ox 66% this am, CVP 19-20 . Weight unchanged, on lasix 120mg  q 8 hours + metolazone 5mg  BID.   Creatinine 1.39->1.44->1.48.     Objective:     Vital Signs:   Temp:  [96.4 F (35.8 C)-97.9 F (36.6 C)] 97.7 F (36.5 C) (05/18 0400) Pulse Rate:  [39-88] 73 (05/18 0700) Resp:  [11-22] 13 (05/18 0700) BP: (77-100)/(51-76) 91/68 (05/18 0700) SpO2:  [93 %-100 %] 98 % (05/18 0700) Weight:  [218 lb 14.4 oz (99.3 kg)] 218 lb 14.4 oz (99.3 kg) (05/18 0500) Last BM Date: 02/11/17  Weight change: Filed Weights   02/14/17 0345 02/15/17 0334 02/16/17 0500  Weight: 218 lb 12.8 oz (99.2 kg) 218 lb 3.2 oz (99 kg) 218 lb 14.4 oz (99.3 kg)    Intake/Output:   Intake/Output Summary (Last 24 hours) at 02/16/17 0710 Last data filed at 02/16/17 0700  Gross per 24 hour  Intake          1422.01 ml  Output             2625 ml  Net         -1202.99 ml     Physical Exam: CVP 19 General: Ill appearing male, fatigued.  HEENT: normal Neck: supple. JVP to jaw. Carotids 2+ bilat; no bruits. No thyromegaly or nodule noted. Cor: PMI laterally displaced. Irregularly irregular, +s3. No M/G/R noted Lungs: Clear in upper lobes bilaterally, diminished crackles in bases.  Abdomen: soft, non-tender, distended, no HSM. No bruits or masses. +BS  Extremities: no cyanosis, clubbing, rash. 3+ edema to thighs, unna boots in place.  Neuro: alert & orientedx3, cranial nerves grossly intact. moves all 4 extremities w/o difficulty. Affect pleasant     Telemetry:  Afib rates in the 80's,    Labs: Basic Metabolic Panel:  Recent Labs Lab 02/09/17 1151  02/12/17 0500 02/13/17 0400 02/14/17 0346 02/15/17 0324 02/16/17 0351  NA 130*  < > 130* 128* 130* 128* 127*  K 5.2*  < > 3.1* 3.2* 3.3* 3.6 3.2*  CL 97*  < > 92* 91*  92* 89* 86*  CO2 23  < > 26 26 27 28 28   GLUCOSE 94  < > 117* 109* 160* 198* 145*  BUN 47*  < > 47* 43* 40* 37* 38*  CREATININE 1.51*  < > 1.72* 1.64* 1.39* 1.44* 1.48*  CALCIUM 8.7*  < > 8.5* 8.7* 8.5* 8.4* 8.8*  MG 2.0  --   --   --  1.8  --   --   < > = values in this interval not displayed.  Liver Function Tests:  Recent Labs Lab 02/09/17 1151  AST 77*  ALT 32  ALKPHOS 37*  BILITOT 2.7*  PROT 5.9*  ALBUMIN 3.0*   No results for input(s): LIPASE, AMYLASE in the last 168 hours. No results for input(s): AMMONIA in the last 168 hours.  CBC:  Recent Labs Lab 02/10/17 0414 02/11/17 0445 02/12/17 0500 02/13/17 0400 02/14/17 0346  WBC 7.3 6.6 6.1 5.8 6.5  HGB 14.8 13.8 13.3 13.3 13.8  HCT 42.7 39.9 38.2* 38.3* 39.5  MCV 91.0 90.1 89.5 89.1 89.4  PLT 138* 133* 105* 112* 118*    Cardiac Enzymes:  Recent Labs Lab 02/09/17 1151  TROPONINI 0.96*  BNP: BNP (last 3 results)  Recent Labs  08/03/16 1427 12/13/16 1051 02/09/17 1151  BNP 485.2* 1,497.5* 2,071.9*    ProBNP (last 3 results) No results for input(s): PROBNP in the last 8760 hours.     Medications:     Scheduled Medications: . apixaban  5 mg Oral BID  . Chlorhexidine Gluconate Cloth  6 each Topical Q0600  . cholecalciferol  1,000 Units Oral Daily  . insulin aspart  0-15 Units Subcutaneous TID WC  . insulin glargine  5 Units Subcutaneous QHS  . latanoprost  1 drop Left Eye QHS  . metolazone  5 mg Oral BID  . modafinil  200 mg Oral Daily  . multivitamin with minerals   Oral Daily  . omega-3 acid ethyl esters  2 g Oral BID  . potassium chloride  40 mEq Oral BID  . rosuvastatin  10 mg Oral q1800  . sodium chloride flush  10-40 mL Intracatheter Q12H  . sodium chloride flush  3 mL Intravenous Q12H  . spironolactone  25 mg Oral Daily    Infusions: . sodium chloride    . amiodarone 30 mg/hr (02/16/17 0700)  . DOBUTamine 7.5 mcg/kg/min (02/16/17 0700)  . furosemide 120 mg (02/16/17 0607)   . norepinephrine (LEVOPHED) Adult infusion 6 mcg/min (02/16/17 0700)    PRN Medications: sodium chloride, acetaminophen, ALPRAZolam, ondansetron (ZOFRAN) IV, polyethylene glycol, sodium chloride flush, sodium chloride flush   Assessment:   1. Acute on chronic systolic HF with marked volume overload and R>>L symptoms     --Echo 4/18 EF 40-45%. Mild RV dysfunction Moderate MR, moderate to severe TR    --Echo 5/13 EF 20-25%RV severely down     --Initial co-ox 37%  2. Cardiogenic shock 3. PAF, recurrent    --amio started on admit 4. 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 Acute on CKD, III 6. OSA on CPAP 7. h/o AFL s/p ablation 2015 8. Hyponatremia  Plan/Discussion:    Remains tenuous, still with marked volume overload despite high dose diuretics and dual inotropes. BP soft overnight, would increase Levo to 89mcg and titrate to keep SBP consistently above 90. Weight unchanged despite I/O negative. Can change dobutamine to milrinone and add Lasix gtt.   Remains in Afib, rates in the 80's. Will need DCCV when stabilized. Continue apixaban for anticoagulation, will need to switch to heparin if he gets IABP. Creatinine stable.   Will replete K, will get 58mEq BID today.    Arbutus Leas, NP  7:10 AM   Agree.   He is making little or no progress. Remains on dual pressors but urine output not picking up much. CVP remains very high. Co-ox ok.   Feel sluggish. Frustrated by lack of progress.   Remains in AF. Rate controlled on amio.   On exam fatigued appearing  Sitting in chair JVP to ear Cor IRR + s3 Lungs diminished Ab distended Ext: cool 3-4+ edema  Remains critically ill with advanced, possible end-stage HF. Will switch to lasix gtt at 30. If no response can consider swan a nd IABP though with co-ox > 60% not sure how much this will help.   CRITICAL CARE Performed by: Glori Bickers  Total critical care time: 35 minutes  Critical care time was exclusive of separately billable  procedures and treating other patients.  Critical care was necessary to treat or prevent imminent or life-threatening deterioration.  Critical care was time spent personally by me (independent of midlevel providers or residents) on the following activities:  development of treatment plan with patient and/or surrogate as well as nursing, discussions with consultants, evaluation of patient's response to treatment, examination of patient, obtaining history from patient or surrogate, ordering and performing treatments and interventions, ordering and review of laboratory studies, ordering and review of radiographic studies, pulse oximetry and re-evaluation of patient's condition.    Glori Bickers, MD  12:53 PM

## 2017-02-16 NOTE — Progress Notes (Signed)
   02/16/17 1100  Clinical Encounter Type  Visited With Patient and family together  Visit Type Follow-up  Advance Directives (For Healthcare)  Does Patient Have a Medical Advance Directive? Yes  Does patient want to make changes to medical advance directive? Yes (Inpatient - patient requests chaplain consult to change a medical advance directive)  Type of Advance Directive [place] in Chart? Yes  Healthcare Power of Attorney Requested and Now in Chart Copy in chart  Completed AD.  Chaplain Jaggar Benko A.Nonnie Done, MA-PC, [phone]

## 2017-02-16 NOTE — Progress Notes (Signed)
   02/16/17 0905  Clinical Encounter Type  Visited With Patient and family together  Visit Type Follow-up  Referral From Patient  Consult/Referral To Chaplain  Recommendations (follow up)  Spiritual Encounters  Spiritual Needs Literature;Brochure  Stress Factors  Patient Stress Factors Other (Comment) (desireto get forms notorized)  Family Stress Factors Other (Comment) (same)  Advance Directives (For Healthcare)  Does Patient Have a Medical Advance Directive? No  Does patient want to make changes to medical advance directive? Yes (Inpatient - patient requests chaplain consult to change a medical advance directive)  Type of Advance Directive [place];Living will  Copy of [place] in Chart? No - copy requested  Pt wants to complete and get notarized before his surgery.  Tried to call all the Notary, no one available yet this morning @9 :15, call both Spiritual care and Clinical workers, left message, no response.  Will follow up throughout the day to try and help patient get AD completed.  Chaplain Aylen Stradford A. Nonnie Done , MA-PC, [phone]

## 2017-02-17 LAB — BASIC METABOLIC PANEL
Anion gap: 12 (ref 5–15)
BUN: 42 mg/dL — AB (ref 6–20)
CO2: 29 mmol/L (ref 22–32)
CREATININE: 1.6 mg/dL — AB (ref 0.61–1.24)
Calcium: 8.9 mg/dL (ref 8.9–10.3)
Chloride: 85 mmol/L — ABNORMAL LOW (ref 101–111)
GFR calc Af Amer: 48 mL/min — ABNORMAL LOW (ref >60)
GFR, EST NON AFRICAN AMERICAN: 42 mL/min — AB (ref >60)
GLUCOSE: 153 mg/dL — AB (ref 65–99)
Potassium: 3.5 mmol/L (ref 3.5–5.1)
Sodium: 126 mmol/L — ABNORMAL LOW (ref 135–145)

## 2017-02-17 LAB — GLUCOSE, CAPILLARY
Glucose-Capillary: 120 mg/dL — ABNORMAL HIGH (ref 65–99)
Glucose-Capillary: 159 mg/dL — ABNORMAL HIGH (ref 65–99)
Glucose-Capillary: 195 mg/dL — ABNORMAL HIGH (ref 65–99)
Glucose-Capillary: 106 mg/dL — ABNORMAL HIGH (ref 65–99)

## 2017-02-17 LAB — COOXEMETRY PANEL
CARBOXYHEMOGLOBIN: 0.9 % (ref 0.5–1.5)
Methemoglobin: 1 % (ref 0.0–1.5)
O2 Saturation: 58.8 %
Total hemoglobin: 14.2 g/dL (ref 12.0–16.0)

## 2017-02-17 LAB — MAGNESIUM: Magnesium: 2 mg/dL (ref 1.7–2.4)

## 2017-02-17 MED ORDER — SORBITOL 70 % SOLN
15.0000 mL | Freq: Once | Status: AC
  Administered 2017-02-17: 15 mL via ORAL
  Filled 2017-02-17: qty 30

## 2017-02-17 MED ORDER — ACETAZOLAMIDE ER 500 MG PO CP12
500.0000 mg | ORAL_CAPSULE | Freq: Two times a day (BID) | ORAL | Status: DC
  Administered 2017-02-17 – 2017-02-18 (×3): 500 mg via ORAL
  Filled 2017-02-17 (×3): qty 1

## 2017-02-17 MED ORDER — HEPARIN (PORCINE) IN NACL 100-0.45 UNIT/ML-% IJ SOLN
700.0000 [IU]/h | INTRAMUSCULAR | Status: DC
  Administered 2017-02-17: 1000 [IU]/h via INTRAVENOUS
  Administered 2017-02-19: 800 [IU]/h via INTRAVENOUS
  Filled 2017-02-17 (×2): qty 250

## 2017-02-17 NOTE — Progress Notes (Addendum)
ANTICOAGULATION CONSULT NOTE - Initial Consult  Pharmacy Consult for heparin Indication: atrial fibrillation  No Known Allergies  Patient Measurements: Height: 5\' 9"  (175.3 cm) Weight: 220 lb 7.4 oz (100 kg) IBW/kg (Calculated) : 70.7 Heparin Dosing Weight: 88kg  Vital Signs: Temp: 97.4 F (36.3 C) (05/19 1256) Temp Source: Oral (05/19 1256) BP: 92/79 (05/19 1245) Pulse Rate: 81 (05/19 1245)  Labs:  Recent Labs  02/15/17 0324 02/16/17 0351 02/17/17 0459  CREATININE 1.44* 1.48* 1.60*    Estimated Creatinine Clearance: 49.4 mL/min (A) (by C-G formula based on SCr of 1.6 mg/dL (H)).   Medical History: Past Medical History:  Diagnosis Date  . Allergy   . Arthritis   . Atrial flutter ([place])    a/ 05/2014 s/p RFCA.  Marland Kitchen CAD (coronary artery disease) 10/2007   a. 10/2007 s/p MI/PCI of RCA [facility] - Dr. Collene Mares);  b. 06/2016 MV: EF 34%, inf/inflat infarct; c. 07/2016 Cath: LM nl, LAd 90ost/prox, 78m, 99d, D2 30, RI small, LCX 17m, Om1 40, RCA 50ost, 5 ISR, 61m, 30d, RPDA  80ost, RPL2 70, EF 45-50%;  d. 07/2016 CABG x 5 (LIMA->LAD, VG->Diag, VG->RI, VG->PDA->RPL).  . Chronic systolic CHF (congestive heart failure) ([place])    a. 07/2016 Echo: Ef 30-35%.  . Diabetes mellitus without complication ([place])   . Extrinsic asthma, unspecified   . Glaucoma   . Gout   . Hyperlipidemia   . Hypertensive heart disease   . Ischemic cardiomyopathy    a. 07/2016 Echo: Ef 30-35%, mildly dil LA, mild MR/TR, PASP 24 mmHg.  Marland Kitchen Post-op Afib following CABG (07/2016)    a. 07/2016 - placed on amio post-op.  Marland Kitchen Retinal vascular occlusion, unspecified    Right  . Sleep apnea     Assessment: 71 yo male continued on PTA apixaban for atrial fibrillation. Pharmacy consulted to dose heparin in expectation of balloon pump placement. Apixaban has been discontinued (last dose 1000 5/19). Will time heparin ~12 hours after this dose with no bolus. Will check heparin level and aPTT to be sure they are correlating.  Previously patient was therapeutic on 1650 units/hr however will be more conservative given lower goal with balloon pump.  Goal of Therapy:  0.2-0.5 anti-Xa level, aPTT 50-85 Monitor platelets by anticoagulation protocol: Yes   Plan:  Start heparin infusion at 1000 units/hr Check anti-Xa level and aPTT in 8 hours Daily anti-Xa level while on heparin Continue to monitor H&H and platelets  Melburn Popper 02/17/2017,1:38 PM

## 2017-02-17 NOTE — Progress Notes (Signed)
Orthopedic Tech Progress Note Patient Details:  Tommy Sullivan Jul 20, 1946 [id number]  Ortho Devices Type of Ortho Device: Louretta Parma boot Ortho Device/Splint Location: Bilateral unna boots Ortho Device/Splint Interventions: Application   Tommy Sullivan 02/17/2017, 7:13 PM

## 2017-02-17 NOTE — Progress Notes (Signed)
Advanced Heart Failure Rounding Note   Subjective:    PICC placed on 5/12. Moved to ICU 5/15 for worsening shock  Started on lasix gtt to 51 yesterday. On dual inotropes with dobutamine 7.5 mcg. Levo increased to 20 overnight. Now weaning back down. Co-ox 59%  Urine output remains sluggish.  Remains in AF. Rate controlled on amio. Feels great  Denies dyspnea.   Creatinine 1.39->1.44->1.48.-> 1.60     Objective:     Vital Signs:   Temp:  [97.4 F (36.3 C)-98.1 F (36.7 C)] 97.5 F (36.4 C) (05/19 0904) Pulse Rate:  [52-179] 117 (05/19 1200) Resp:  [11-23] 23 (05/19 1200) BP: (77-116)/(47-91) 116/90 (05/19 1200) SpO2:  [91 %-100 %] 95 % (05/19 1200) Weight:  [100 kg (220 lb 7.4 oz)] 100 kg (220 lb 7.4 oz) (05/19 0500) Last BM Date: 02/11/17  Weight change: Filed Weights   02/15/17 0334 02/16/17 0500 02/17/17 0500  Weight: 99 kg (218 lb 3.2 oz) 99.3 kg (218 lb 14.4 oz) 100 kg (220 lb 7.4 oz)    Intake/Output:   Intake/Output Summary (Last 24 hours) at 02/17/17 1219 Last data filed at 02/17/17 1100  Gross per 24 hour  Intake          1771.03 ml  Output             1945 ml  Net          -173.97 ml     Physical Exam: CVP 22 General: Ill appearing male, fatigued. Sittingup on side of bed  HEENT: normal pale. anicteric Neck: supple. JVP to ear . Carotids 2+ bilat; no bruits. No thyromegaly or nodule noted. Cor: PMI laterally displaced. IRR. +s3  Lungs: Diminished throughout crackles in bases   Abdomen: soft, non-tender, ++ distended Good BS  Extremities: no cyanosis, clubbing, rash. Mildly mottled  3-4+ edema to thighs, unna boots in place.  Neuro: alert & orientedx3, cranial nerves grossly intact. moves all 4 extremities w/o difficulty. Affect pleasant     Telemetry:  AF 80s +PVCs Personally reviewed    Labs: Basic Metabolic Panel:  Recent Labs Lab 02/13/17 0400 02/14/17 0346 02/15/17 0324 02/16/17 0351 02/17/17 0459  NA 128* 130* 128* 127*  126*  K 3.2* 3.3* 3.6 3.2* 3.5  CL 91* 92* 89* 86* 85*  CO2 26 27 28 28 29   GLUCOSE 109* 160* 198* 145* 153*  BUN 43* 40* 37* 38* 42*  CREATININE 1.64* 1.39* 1.44* 1.48* 1.60*  CALCIUM 8.7* 8.5* 8.4* 8.8* 8.9  MG  --  1.8  --   --  2.0    Liver Function Tests: No results for input(s): AST, ALT, ALKPHOS, BILITOT, PROT, ALBUMIN in the last 168 hours. No results for input(s): LIPASE, AMYLASE in the last 168 hours. No results for input(s): AMMONIA in the last 168 hours.  CBC:  Recent Labs Lab 02/11/17 0445 02/12/17 0500 02/13/17 0400 02/14/17 0346  WBC 6.6 6.1 5.8 6.5  HGB 13.8 13.3 13.3 13.8  HCT 39.9 38.2* 38.3* 39.5  MCV 90.1 89.5 89.1 89.4  PLT 133* 105* 112* 118*    Cardiac Enzymes: No results for input(s): CKTOTAL, CKMB, CKMBINDEX, TROPONINI in the last 168 hours.  BNP: BNP (last 3 results)  Recent Labs  08/03/16 1427 12/13/16 1051 02/09/17 1151  BNP 485.2* 1,497.5* 2,071.9*    ProBNP (last 3 results) No results for input(s): PROBNP in the last 8760 hours.     Medications:     Scheduled Medications: . apixaban  5 mg Oral BID  . Chlorhexidine Gluconate Cloth  6 each Topical Q0600  . cholecalciferol  1,000 Units Oral Daily  . insulin aspart  0-15 Units Subcutaneous TID WC  . insulin glargine  5 Units Subcutaneous QHS  . latanoprost  1 drop Left Eye QHS  . metolazone  5 mg Oral BID  . modafinil  200 mg Oral Daily  . multivitamin with minerals   Oral Daily  . omega-3 acid ethyl esters  2 g Oral BID  . potassium chloride  40 mEq Oral BID  . rosuvastatin  10 mg Oral q1800  . sodium chloride flush  10-40 mL Intracatheter Q12H  . sodium chloride flush  3 mL Intravenous Q12H  . spironolactone  25 mg Oral Daily    Infusions: . sodium chloride    . amiodarone 30 mg/hr (02/17/17 1207)  . DOBUTamine 7.5 mcg/kg/min (02/17/17 1100)  . furosemide (LASIX) infusion 30 mg/hr (02/17/17 1100)  . norepinephrine (LEVOPHED) Adult infusion 20.053 mcg/min  (02/17/17 1100)    PRN Medications: sodium chloride, acetaminophen, ALPRAZolam, ondansetron (ZOFRAN) IV, polyethylene glycol, sodium chloride flush, sodium chloride flush   Assessment:   1. Acute on chronic systolic HF with marked volume overload and R>>L symptoms     --Echo 4/18 EF 40-45%. Mild RV dysfunction Moderate MR, moderate to severe TR    --Echo 5/13 EF 20-25%RV severely down     --Initial co-ox 37%  2. Cardiogenic shock 3. PAF, recurrent    --amio started on admit 4. 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 Acute on CKD, III 6. OSA on CPAP 7. h/o AFL s/p ablation 2015 8. Hyponatremia  Plan/Discussion:    He has progressive cardiogenic shock due to severe biventricular HF. He has massive volume overload but unable to diurese despite inotrope support and high-dose lasix.   I think he will need IABP and swan as last option to try to improve him. Will hold Eliquis (received dose this am) and keep NPO. Start heparin tonight (no bolus).   Continue IV lasix and dual inotropes. Add diamox.   Remains in Afib, rates in the 80's. Will need DCCV when stabilized - currently too unstable.    CRITICAL CARE Performed by: Glori Bickers  Total critical care time: 35 minutes  Critical care time was exclusive of separately billable procedures and treating other patients.  Critical care was necessary to treat or prevent imminent or life-threatening deterioration.  Critical care was time spent personally by me (independent of midlevel providers or residents) on the following activities: development of treatment plan with patient and/or surrogate as well as nursing, discussions with consultants, evaluation of patient's response to treatment, examination of patient, obtaining history from patient or surrogate, ordering and performing treatments and interventions, ordering and review of laboratory studies, ordering and review of radiographic studies, pulse oximetry and re-evaluation of patient's  condition.   Glori Bickers, MD  12:19 PM

## 2017-02-18 ENCOUNTER — Inpatient Hospital Stay (HOSPITAL_COMMUNITY)
Admission: RE | Disposition: A | Discharge status: Other Acute Inpatient Hospital | Payer: Self-pay | Source: Ambulatory Visit | Attending: Internal Medicine

## 2017-02-18 ENCOUNTER — Inpatient Hospital Stay (HOSPITAL_COMMUNITY): Payer: PPO

## 2017-02-18 ENCOUNTER — Encounter (HOSPITAL_COMMUNITY)
Admission: RE | Disposition: A | Discharge status: Other Acute Inpatient Hospital | Payer: Self-pay | Source: Ambulatory Visit | Attending: Internal Medicine

## 2017-02-18 HISTORY — PX: IABP INSERTION: CATH118242

## 2017-02-18 HISTORY — PX: RIGHT HEART CATH: CATH118263

## 2017-02-18 LAB — BASIC METABOLIC PANEL
ANION GAP: 14 (ref 5–15)
Anion gap: 15 (ref 5–15)
BUN: 44 mg/dL — AB (ref 6–20)
BUN: 42 mg/dL — ABNORMAL HIGH (ref 6–20)
CALCIUM: 9 mg/dL (ref 8.9–10.3)
CALCIUM: 8.5 mg/dL — AB (ref 8.9–10.3)
CO2: 28 mmol/L (ref 22–32)
CO2: 28 mmol/L (ref 22–32)
Chloride: 82 mmol/L — ABNORMAL LOW (ref 101–111)
Chloride: 84 mmol/L — ABNORMAL LOW (ref 101–111)
Creatinine, Ser: 1.63 mg/dL — ABNORMAL HIGH (ref 0.61–1.24)
Creatinine, Ser: 1.65 mg/dL — ABNORMAL HIGH (ref 0.61–1.24)
GFR calc Af Amer: 47 mL/min — ABNORMAL LOW (ref >60)
GFR calc Af Amer: 47 mL/min — ABNORMAL LOW (ref >60)
GFR calc non Af Amer: 40 mL/min — ABNORMAL LOW (ref >60)
GFR, EST NON AFRICAN AMERICAN: 41 mL/min — AB (ref >60)
GLUCOSE: 113 mg/dL — AB (ref 65–99)
GLUCOSE: 138 mg/dL — AB (ref 65–99)
POTASSIUM: 2.4 mmol/L — AB (ref 3.5–5.1)
Potassium: 3.1 mmol/L — ABNORMAL LOW (ref 3.5–5.1)
Sodium: 125 mmol/L — ABNORMAL LOW (ref 135–145)
Sodium: 126 mmol/L — ABNORMAL LOW (ref 135–145)

## 2017-02-18 LAB — LACTIC ACID, PLASMA
Lactic Acid, Venous: 1 mmol/L (ref 0.5–1.9)
Lactic Acid, Venous: 0.8 mmol/L (ref 0.5–1.9)

## 2017-02-18 LAB — GLUCOSE, CAPILLARY
GLUCOSE-CAPILLARY: 125 mg/dL — AB (ref 65–99)
Glucose-Capillary: 122 mg/dL — ABNORMAL HIGH (ref 65–99)
Glucose-Capillary: 100 mg/dL — ABNORMAL HIGH (ref 65–99)
Glucose-Capillary: 71 mg/dL (ref 65–99)

## 2017-02-18 LAB — COOXEMETRY PANEL
CARBOXYHEMOGLOBIN: 0.8 % (ref 0.5–1.5)
CARBOXYHEMOGLOBIN: 0.9 % (ref 0.5–1.5)
METHEMOGLOBIN: 0.9 % (ref 0.0–1.5)
Methemoglobin: 0.9 % (ref 0.0–1.5)
O2 SAT: 47.9 %
O2 SAT: 66.1 %
TOTAL HEMOGLOBIN: 14.4 g/dL (ref 12.0–16.0)
Total hemoglobin: 13.3 g/dL (ref 12.0–16.0)

## 2017-02-18 LAB — APTT
APTT: 67 s — AB (ref 24–36)
aPTT: 101 seconds — ABNORMAL HIGH (ref 24–36)

## 2017-02-18 LAB — HEPARIN LEVEL (UNFRACTIONATED)

## 2017-02-18 SURGERY — RIGHT HEART CATH
Anesthesia: LOCAL

## 2017-02-18 MED ORDER — HEPARIN (PORCINE) IN NACL 2-0.9 UNIT/ML-% IJ SOLN
INTRAMUSCULAR | Status: AC | PRN
  Administered 2017-02-18: 500 mL

## 2017-02-18 MED ORDER — MIDAZOLAM HCL 2 MG/2ML IJ SOLN
INTRAMUSCULAR | Status: AC
  Administered 2017-02-18: 0.5 mg
  Filled 2017-02-18: qty 2

## 2017-02-18 MED ORDER — SORBITOL 70 % SOLN
15.0000 mL | Freq: Once | Status: AC
  Administered 2017-02-18: 15 mL via ORAL

## 2017-02-18 MED ORDER — SODIUM CHLORIDE 0.9% FLUSH: 3.0000 mL | INTRAVENOUS | Status: DC | PRN

## 2017-02-18 MED ORDER — MILRINONE LACTATE IN DEXTROSE 20-5 MG/100ML-% IV SOLN
0.1250 ug/kg/min | INTRAVENOUS | Status: DC
  Administered 2017-02-18: 0.125 ug/kg/min via INTRAVENOUS
  Filled 2017-02-18: qty 100

## 2017-02-18 MED ORDER — SODIUM CHLORIDE 0.9% FLUSH: 3.0000 mL | Freq: Two times a day (BID) | INTRAVENOUS | Status: DC

## 2017-02-18 MED ORDER — SODIUM CHLORIDE 0.9 % IV SOLN: INTRAVENOUS | Status: DC

## 2017-02-18 MED ORDER — HEPARIN (PORCINE) IN NACL 2-0.9 UNIT/ML-% IJ SOLN
INTRAMUSCULAR | Status: AC
  Filled 2017-02-18: qty 1000

## 2017-02-18 MED ORDER — SODIUM CHLORIDE 0.9 % IV SOLN: 250.0000 mL | INTRAVENOUS | Status: DC | PRN

## 2017-02-18 MED ORDER — ASPIRIN 81 MG PO CHEW: 81.0000 mg | CHEWABLE_TABLET | ORAL | Status: DC

## 2017-02-18 MED ORDER — LIDOCAINE HCL (PF) 1 % IJ SOLN
INTRAMUSCULAR | Status: DC | PRN
  Administered 2017-02-18: 15 mL
  Administered 2017-02-18: 5 mL

## 2017-02-18 MED ORDER — POTASSIUM CHLORIDE 20 MEQ/15ML (10%) PO SOLN
40.0000 meq | Freq: Two times a day (BID) | ORAL | Status: DC
  Administered 2017-02-18: 40 meq
  Filled 2017-02-18 (×3): qty 30

## 2017-02-18 MED ORDER — ONDANSETRON HCL 4 MG/2ML IJ SOLN: 4.0000 mg | Freq: Four times a day (QID) | INTRAMUSCULAR | Status: DC | PRN

## 2017-02-18 MED ORDER — IOPAMIDOL (ISOVUE-370) INJECTION 76%
INTRAVENOUS | Status: AC
  Filled 2017-02-18: qty 50

## 2017-02-18 MED ORDER — IOPAMIDOL (ISOVUE-370) INJECTION 76%
INTRAVENOUS | Status: DC | PRN
  Administered 2017-02-18: 5 mL

## 2017-02-18 MED ORDER — LIDOCAINE HCL (PF) 1 % IJ SOLN
INTRAMUSCULAR | Status: AC
  Filled 2017-02-18: qty 30

## 2017-02-18 MED ORDER — POTASSIUM CHLORIDE 20 MEQ/15ML (10%) PO SOLN
40.0000 meq | Freq: Once | ORAL | Status: AC
  Administered 2017-02-18: 40 meq

## 2017-02-18 MED ORDER — SORBITOL 70 % SOLN
15.0000 mL | Freq: Once | Status: AC
  Administered 2017-02-18: 15 mL via ORAL
  Filled 2017-02-18: qty 30

## 2017-02-18 MED ORDER — ACETAMINOPHEN 325 MG PO TABS: 650.0000 mg | ORAL_TABLET | ORAL | Status: DC | PRN

## 2017-02-18 SURGICAL SUPPLY — 14 items
BALLN LINEAR 7.5FR IABP 40CC (BALLOONS) ×2
BALLOON LINEAR 7.5FR IABP 40CC (BALLOONS) ×1 IMPLANT
CATH SWAN GANZ VIP 7.5F (CATHETERS) ×2 IMPLANT
COVER PRB 48X5XTLSCP FOLD TPE (BAG) ×1 IMPLANT
COVER PROBE 5X48 (BAG) ×1
HOVERMATT SINGLE USE (MISCELLANEOUS) ×2 IMPLANT
PACK CARDIAC CATHETERIZATION (CUSTOM PROCEDURE TRAY) ×2 IMPLANT
SHEATH PINNACLE 5F 10CM (SHEATH) ×2 IMPLANT
SHEATH PINNACLE 8F 10CM (SHEATH) ×2 IMPLANT
SLEEVE REPOSITIONING LENGTH 30 (MISCELLANEOUS) ×2 IMPLANT
TRANSDUCER W/STOPCOCK (MISCELLANEOUS) ×2 IMPLANT
TUBING ART PRESS 72  MALE/FEM (TUBING) ×1
TUBING ART PRESS 72 MALE/FEM (TUBING) ×1 IMPLANT
WIRE EMERALD 3MM-J .035X150CM (WIRE) ×2 IMPLANT

## 2017-02-18 NOTE — Progress Notes (Signed)
ANTICOAGULATION CONSULT NOTE - Follow up [place] for heparin Indication: atrial fibrillation  No Known Allergies  Patient Measurements: Height: 5\' 9"  (175.3 cm) Weight: 219 lb 2.2 oz (99.4 kg) IBW/kg (Calculated) : 70.7 Heparin Dosing Weight: 88kg  Vital Signs: Temp: 97.9 F (36.6 C) (05/20 2100) BP: 95/68 (05/20 2100) Pulse Rate: 77 (05/20 2100)  Labs:  Recent Labs  02/16/17 0351 02/17/17 0459 02/18/17 0517 02/18/17 2100  APTT  --   --  101* 67*  HEPARINUNFRC  --   --  >2.20*  --   CREATININE 1.48* 1.60* 1.63*  --     Estimated Creatinine Clearance: 48.3 mL/min (A) (by C-G formula based on SCr of 1.63 mg/dL (H)).   Medical History: Past Medical History:  Diagnosis Date  . Allergy   . Arthritis   . Atrial flutter ([place])    a/ 05/2014 s/p RFCA.  Marland Kitchen CAD (coronary artery disease) 10/2007   a. 10/2007 s/p MI/PCI of RCA [facility] - Dr. Collene Mares);  b. 06/2016 MV: EF 34%, inf/inflat infarct; c. 07/2016 Cath: LM nl, LAd 90ost/prox, 68m, 99d, D2 30, RI small, LCX 132m, Om1 40, RCA 50ost, 5 ISR, 49m, 30d, RPDA  80ost, RPL2 70, EF 45-50%;  d. 07/2016 CABG x 5 (LIMA->LAD, VG->Diag, VG->RI, VG->PDA->RPL).  . Chronic systolic CHF (congestive heart failure) ([place])    a. 07/2016 Echo: Ef 30-35%.  . Diabetes mellitus without complication ([place])   . Extrinsic asthma, unspecified   . Glaucoma   . Gout   . Hyperlipidemia   . Hypertensive heart disease   . Ischemic cardiomyopathy    a. 07/2016 Echo: Ef 30-35%, mildly dil LA, mild MR/TR, PASP 24 mmHg.  Marland Kitchen Post-op Afib following CABG (07/2016)    a. 07/2016 - placed on amio post-op.  Marland Kitchen Retinal vascular occlusion, unspecified    Right  . Sleep apnea     Assessment: 71 yo male previously on apixaban for atrial fibrillation (last dose ~1000 5/19). Pharmacy consulted to dose heparin in expectation of balloon pump placement.  -aPTT= 67 and at goal  Goal of Therapy:  0.2-0.5 anti-Xa level, aPTT 50-85 Monitor platelets by  anticoagulation protocol: Yes   Plan:  -No heparin changes needed -Daily heparin level and CBC  Hildred Laser, Pharm D 02/18/2017 9:55 PM

## 2017-02-18 NOTE — Interval H&P Note (Signed)
History and Physical Interval Note:  02/18/2017 1:49 PM  Mardelle Matte  has presented today for surgery, with the diagnosis of chf  The various methods of treatment have been discussed with the patient and family. After consideration of risks, benefits and other options for treatment, the patient has consented to  Procedure(s): Right Heart Cath (N/A) IABP Insertion (N/A) as a surgical intervention .  The patient's history has been reviewed, patient examined, no change in status, stable for surgery.  I have reviewed the patient's chart and labs.  Questions were answered to the patient's satisfaction.     Bensimhon, Quillian Quince

## 2017-02-18 NOTE — Progress Notes (Addendum)
ANTICOAGULATION CONSULT NOTE - Follow up [place] for heparin Indication: atrial fibrillation  No Known Allergies  Patient Measurements: Height: 5\' 9"  (175.3 cm) Weight: 219 lb 2.2 oz (99.4 kg) IBW/kg (Calculated) : 70.7 Heparin Dosing Weight: 88kg  Vital Signs: Temp: 97.6 F (36.4 C) (05/20 0800) Temp Source: Oral (05/20 0800) BP: 84/69 (05/20 0800) Pulse Rate: 59 (05/20 0800)  Labs:  Recent Labs  02/16/17 0351 02/17/17 0459 02/18/17 0517  APTT  --   --  101*  HEPARINUNFRC  --   --  >2.20*  CREATININE 1.48* 1.60* 1.63*    Estimated Creatinine Clearance: 48.3 mL/min (A) (by C-G formula based on SCr of 1.63 mg/dL (H)).   Medical History: Past Medical History:  Diagnosis Date  . Allergy   . Arthritis   . Atrial flutter ([place])    a/ 05/2014 s/p RFCA.  Marland Kitchen CAD (coronary artery disease) 10/2007   a. 10/2007 s/p MI/PCI of RCA [facility] - Dr. Collene Mares);  b. 06/2016 MV: EF 34%, inf/inflat infarct; c. 07/2016 Cath: LM nl, LAd 90ost/prox, 16m, 99d, D2 30, RI small, LCX 148m, Om1 40, RCA 50ost, 5 ISR, 15m, 30d, RPDA  80ost, RPL2 70, EF 45-50%;  d. 07/2016 CABG x 5 (LIMA->LAD, VG->Diag, VG->RI, VG->PDA->RPL).  . Chronic systolic CHF (congestive heart failure) ([place])    a. 07/2016 Echo: Ef 30-35%.  . Diabetes mellitus without complication ([place])   . Extrinsic asthma, unspecified   . Glaucoma   . Gout   . Hyperlipidemia   . Hypertensive heart disease   . Ischemic cardiomyopathy    a. 07/2016 Echo: Ef 30-35%, mildly dil LA, mild MR/TR, PASP 24 mmHg.  Marland Kitchen Post-op Afib following CABG (07/2016)    a. 07/2016 - placed on amio post-op.  Marland Kitchen Retinal vascular occlusion, unspecified    Right  . Sleep apnea     Assessment: 71 yo male previously on apixaban for atrial fibrillation (last dose ~1000 5/19). Pharmacy consulted to dose heparin in expectation of balloon pump placement. Heparin level falsely elevated given recent use of apixaban. Will use aPTT for now. Initial aPTT was  101. This roughly correlates to a heparin level of 0.7 (right on the upper end of general therapeutic range). Spoke to RN who states that balloon pump is still not placed so patient is actually adequately anticoagulated. However, coox is down further today. In preparation for balloon pump will still decrease rate and shoot for lower end of therapeutic range.   Previously patient was therapeutic on 1650 units/hr however will be more conservative given lower goal with balloon pump.  Goal of Therapy:  0.2-0.5 anti-Xa level, aPTT 50-85 Monitor platelets by anticoagulation protocol: Yes   Plan:  Decrease heparin infusion to 800 units/hr Check aPTT in 8 hours Daily anti-Xa level and aPTT while on heparin Continue to monitor H&H and platelets  Melburn Popper 02/18/2017,8:46 AM    Addendum: Damaris Schooner to RN, heparin held and balloon pump placed, resumed ~1430. Will retime aPTT for ~6 hours from this time due to recent rate adjustment.   Plan: Continue heparin infusion at 800 units/hr Check aPTT in 6 hours

## 2017-02-18 NOTE — CV Procedure (Addendum)
Findings:  Done on dobutamine 7.5 mcg/kg/min and norepi 15 mcg/kg/min  RA = 24 RV = 35/23 PA = 37/22 (28) PCW = 25 Fick cardiac output/index = 3.5/1.6 PVR = 0.9 WU FA sat = 94% PA sat = 52%, 52% PAPI = 0.625 RA/PCWP = 0.96  Assessment/Plan:  Severe biventricular failure with cardiogenic shock physiology despite dual inotrope support.   PAPi and RA/PCWP are indicative of severe RV failure which is confirmed by lack of pulsatility in RV tracing.   IABP placed to support diuresis.   Suspect long-term options will be quite limited. Transplant likely only option but may not qualify due to age. If not improving in am can discuss ECMO -> transplant option with [facility]. Blood type A pos.  Glori Bickers, MD  2:42 PM

## 2017-02-18 NOTE — Progress Notes (Signed)
  On returning from the cath lab patient with persistent abdominal pain and cramping (was present before IABP insertion).  Very hypoactive BS. Belly tender to touch but no rebound.   With the assistance of nursing team, I personally placed NG tube for decompression.   We got about 100-200cc of clear fluid out of belly with mild improvement.   Stat KUB obtained. (I viewed personally)  NGT appropriately placed. Markedly dilated loops of bowel throughout with stool in rectal vault. Unclear if there is a transition point near rectum. Will await formal read from Radiology. Suspect probable ileus from low flow HF.   Will keep NGT to LIWS. Keep NPO for bowel rest. Check lactate.   Additional 30 mins CCT.   Glori Bickers, MD  3:26 PM

## 2017-02-18 NOTE — H&P (View-Only) (Signed)
Advanced Heart Failure Rounding Note   Subjective:    PICC placed on 5/12. Moved to ICU 5/15 for worsening shock  Remains on lasix gtt at 30 and dual inotropes with dobutamine 7.5 mcg and norepi at 15. Co-ox 59%-> 48%  Urine output has picked up a bit but still only down 1 pounds.  Remains in AF. Rate controlled on amio.  Feels fatigued today. Denies dyspnea. CVP remains 22  Creatinine 1.39->1.44->1.48.-> 1.60 ->1.63    Objective:     Vital Signs:   Temp:  [97.4 F (36.3 C)-99 F (37.2 C)] 97.6 F (36.4 C) (05/20 0800) Pulse Rate:  [43-193] 68 (05/20 1015) Resp:  [11-30] 13 (05/20 1015) BP: (77-116)/(47-93) 84/72 (05/20 1015) SpO2:  [86 %-100 %] 97 % (05/20 1015) Weight:  [99.4 kg (219 lb 2.2 oz)] 99.4 kg (219 lb 2.2 oz) (05/20 0500) Last BM Date: 02/11/17  Weight change: Filed Weights   02/16/17 0500 02/17/17 0500 02/18/17 0500  Weight: 99.3 kg (218 lb 14.4 oz) 100 kg (220 lb 7.4 oz) 99.4 kg (219 lb 2.2 oz)    Intake/Output:   Intake/Output Summary (Last 24 hours) at 02/18/17 1045 Last data filed at 02/18/17 1000  Gross per 24 hour  Intake          2454.72 ml  Output             3150 ml  Net          -695.28 ml     Physical Exam: CVP 22 General: Ill appearing male, fatigued. Lying in bed HEENT: normal pale. anicteric Neck: supple. JVP  To ear  . Carotids 2+ bilat; no bruits. No thyromegaly or nodule noted. Cor: PMI laterally displaced. IRR +s3 Lungs: Diminished throughout crackles in bases   Abdomen: soft, non-tender, ++ distended hypoactive BS  Extremities: no cyanosis, clubbing, rash. Mildly mottled  3+  edema to thighs, +unna 8oots  Neuro: alert & orientedx3, cranial nerves grossly intact. moves all 4 extremities w/o difficulty. Affect pleasant     Telemetry:  AF 80s + frequent PVCs. Personally reviewed      Labs: Basic Metabolic Panel:  Recent Labs Lab 02/14/17 0346 02/15/17 0324 02/16/17 0351 02/17/17 0459 02/18/17 0517  NA  130* 128* 127* 126* 125*  K 3.3* 3.6 3.2* 3.5 3.1*  CL 92* 89* 86* 85* 82*  CO2 27 28 28 29 28   GLUCOSE 160* 198* 145* 153* 113*  BUN 40* 37* 38* 42* 44*  CREATININE 1.39* 1.44* 1.48* 1.60* 1.63*  CALCIUM 8.5* 8.4* 8.8* 8.9 9.0  MG 1.8  --   --  2.0  --     Liver Function Tests: No results for input(s): AST, ALT, ALKPHOS, BILITOT, PROT, ALBUMIN in the last 168 hours. No results for input(s): LIPASE, AMYLASE in the last 168 hours. No results for input(s): AMMONIA in the last 168 hours.  CBC:  Recent Labs Lab 02/12/17 0500 02/13/17 0400 02/14/17 0346  WBC 6.1 5.8 6.5  HGB 13.3 13.3 13.8  HCT 38.2* 38.3* 39.5  MCV 89.5 89.1 89.4  PLT 105* 112* 118*    Cardiac Enzymes: No results for input(s): CKTOTAL, CKMB, CKMBINDEX, TROPONINI in the last 168 hours.  BNP: BNP (last 3 results)  Recent Labs  08/03/16 1427 12/13/16 1051 02/09/17 1151  BNP 485.2* 1,497.5* 2,071.9*    ProBNP (last 3 results) No results for input(s): PROBNP in the last 8760 hours.     Medications:     Scheduled Medications: .  acetaZOLAMIDE  500 mg Oral Q12H  . Chlorhexidine Gluconate Cloth  6 each Topical Q0600  . cholecalciferol  1,000 Units Oral Daily  . insulin aspart  0-15 Units Subcutaneous TID WC  . insulin glargine  5 Units Subcutaneous QHS  . latanoprost  1 drop Left Eye QHS  . metolazone  5 mg Oral BID  . modafinil  200 mg Oral Daily  . multivitamin with minerals   Oral Daily  . omega-3 acid ethyl esters  2 g Oral BID  . potassium chloride  40 mEq Oral BID  . rosuvastatin  10 mg Oral q1800  . sodium chloride flush  10-40 mL Intracatheter Q12H  . sodium chloride flush  3 mL Intravenous Q12H  . spironolactone  25 mg Oral Daily    Infusions: . sodium chloride    . amiodarone 30 mg/hr (02/18/17 1000)  . DOBUTamine 7.5 mcg/kg/min (02/18/17 1000)  . furosemide (LASIX) infusion 30 mg/hr (02/18/17 1000)  . heparin 800 Units/hr (02/18/17 1000)  . norepinephrine (LEVOPHED) Adult  infusion 15 mcg/min (02/18/17 1000)    PRN Medications: sodium chloride, acetaminophen, ALPRAZolam, ondansetron (ZOFRAN) IV, polyethylene glycol, sodium chloride flush, sodium chloride flush   Assessment:   1. Acute on chronic systolic HF with marked volume overload and R>>L symptoms     --Echo 4/18 EF 40-45%. Mild RV dysfunction Moderate MR, moderate to severe TR    --Echo 5/13 EF 20-25%RV severely down     --Initial co-ox 37%  2. Cardiogenic shock 3. PAF, recurrent    --amio started on admit 4. 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 Acute on CKD, III 6. OSA on CPAP 7. h/o AFL s/p ablation 2015 8. Hyponatremia  Plan/Discussion:    He has progressive cardiogenic shock due to severe biventricular HF with R> L heart failure. He continues with marked volume overload but we have been unable to mobilize fluid despite dual  inotrope support and high-dose lasix.   We will place IABP and swan today in cath lab. Eliquis stopped yesterday. Now on heparin.   Continue IV lasix and dual inotropes.  Remains in Afib, rates in the 80's. Will need DCCV when stabilized - currently remains too unstable.   I discussed with Dr. Prescott Gum. 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 Could consider Impella CP but I worry RV may not be able to keep up.   CRITICAL CARE Performed by: Glori Bickers  Total critical care time: 35 minutes  Critical care time was exclusive of separately billable procedures and treating other patients.  Critical care was necessary to treat or prevent imminent or life-threatening deterioration.  Critical care was time spent personally by me (independent of midlevel providers or residents) on the following activities: development of treatment plan with patient and/or surrogate as well as nursing, discussions with consultants, evaluation of patient's response to treatment, examination of patient, obtaining history from patient or surrogate, ordering and performing treatments and  interventions, ordering and review of laboratory studies, ordering and review of radiographic studies, pulse oximetry and re-evaluation of patient's condition.   Glori Bickers, MD  10:45 AM

## 2017-02-18 NOTE — Progress Notes (Signed)
Advanced Heart Failure Rounding Note   Subjective:    PICC placed on 5/12. Moved to ICU 5/15 for worsening shock  Remains on lasix gtt at 30 and dual inotropes with dobutamine 7.5 mcg and norepi at 15. Co-ox 59%-> 48%  Urine output has picked up a bit but still only down 1 pounds.  Remains in AF. Rate controlled on amio.  Feels fatigued today. Denies dyspnea. CVP remains 22  Creatinine 1.39->1.44->1.48.-> 1.60 ->1.63    Objective:     Vital Signs:   Temp:  [97.4 F (36.3 C)-99 F (37.2 C)] 97.6 F (36.4 C) (05/20 0800) Pulse Rate:  [43-193] 68 (05/20 1015) Resp:  [11-30] 13 (05/20 1015) BP: (77-116)/(47-93) 84/72 (05/20 1015) SpO2:  [86 %-100 %] 97 % (05/20 1015) Weight:  [99.4 kg (219 lb 2.2 oz)] 99.4 kg (219 lb 2.2 oz) (05/20 0500) Last BM Date: 02/11/17  Weight change: Filed Weights   02/16/17 0500 02/17/17 0500 02/18/17 0500  Weight: 99.3 kg (218 lb 14.4 oz) 100 kg (220 lb 7.4 oz) 99.4 kg (219 lb 2.2 oz)    Intake/Output:   Intake/Output Summary (Last 24 hours) at 02/18/17 1045 Last data filed at 02/18/17 1000  Gross per 24 hour  Intake          2454.72 ml  Output             3150 ml  Net          -695.28 ml     Physical Exam: CVP 22 General: Ill appearing male, fatigued. Lying in bed HEENT: normal pale. anicteric Neck: supple. JVP  To ear  . Carotids 2+ bilat; no bruits. No thyromegaly or nodule noted. Cor: PMI laterally displaced. IRR +s3 Lungs: Diminished throughout crackles in bases   Abdomen: soft, non-tender, ++ distended hypoactive BS  Extremities: no cyanosis, clubbing, rash. Mildly mottled  3+  edema to thighs, +unna 8oots  Neuro: alert & orientedx3, cranial nerves grossly intact. moves all 4 extremities w/o difficulty. Affect pleasant     Telemetry:  AF 80s + frequent PVCs. Personally reviewed      Labs: Basic Metabolic Panel:  Recent Labs Lab 02/14/17 0346 02/15/17 0324 02/16/17 0351 02/17/17 0459 02/18/17 0517  NA  130* 128* 127* 126* 125*  K 3.3* 3.6 3.2* 3.5 3.1*  CL 92* 89* 86* 85* 82*  CO2 27 28 28 29 28   GLUCOSE 160* 198* 145* 153* 113*  BUN 40* 37* 38* 42* 44*  CREATININE 1.39* 1.44* 1.48* 1.60* 1.63*  CALCIUM 8.5* 8.4* 8.8* 8.9 9.0  MG 1.8  --   --  2.0  --     Liver Function Tests: No results for input(s): AST, ALT, ALKPHOS, BILITOT, PROT, ALBUMIN in the last 168 hours. No results for input(s): LIPASE, AMYLASE in the last 168 hours. No results for input(s): AMMONIA in the last 168 hours.  CBC:  Recent Labs Lab 02/12/17 0500 02/13/17 0400 02/14/17 0346  WBC 6.1 5.8 6.5  HGB 13.3 13.3 13.8  HCT 38.2* 38.3* 39.5  MCV 89.5 89.1 89.4  PLT 105* 112* 118*    Cardiac Enzymes: No results for input(s): CKTOTAL, CKMB, CKMBINDEX, TROPONINI in the last 168 hours.  BNP: BNP (last 3 results)  Recent Labs  08/03/16 1427 12/13/16 1051 02/09/17 1151  BNP 485.2* 1,497.5* 2,071.9*    ProBNP (last 3 results) No results for input(s): PROBNP in the last 8760 hours.     Medications:     Scheduled Medications: .  acetaZOLAMIDE  500 mg Oral Q12H  . Chlorhexidine Gluconate Cloth  6 each Topical Q0600  . cholecalciferol  1,000 Units Oral Daily  . insulin aspart  0-15 Units Subcutaneous TID WC  . insulin glargine  5 Units Subcutaneous QHS  . latanoprost  1 drop Left Eye QHS  . metolazone  5 mg Oral BID  . modafinil  200 mg Oral Daily  . multivitamin with minerals   Oral Daily  . omega-3 acid ethyl esters  2 g Oral BID  . potassium chloride  40 mEq Oral BID  . rosuvastatin  10 mg Oral q1800  . sodium chloride flush  10-40 mL Intracatheter Q12H  . sodium chloride flush  3 mL Intravenous Q12H  . spironolactone  25 mg Oral Daily    Infusions: . sodium chloride    . amiodarone 30 mg/hr (02/18/17 1000)  . DOBUTamine 7.5 mcg/kg/min (02/18/17 1000)  . furosemide (LASIX) infusion 30 mg/hr (02/18/17 1000)  . heparin 800 Units/hr (02/18/17 1000)  . norepinephrine (LEVOPHED) Adult  infusion 15 mcg/min (02/18/17 1000)    PRN Medications: sodium chloride, acetaminophen, ALPRAZolam, ondansetron (ZOFRAN) IV, polyethylene glycol, sodium chloride flush, sodium chloride flush   Assessment:   1. Acute on chronic systolic HF with marked volume overload and R>>L symptoms     --Echo 4/18 EF 40-45%. Mild RV dysfunction Moderate MR, moderate to severe TR    --Echo 5/13 EF 20-25%RV severely down     --Initial co-ox 37%  2. Cardiogenic shock 3. PAF, recurrent    --amio started on admit 4. 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 Acute on CKD, III 6. OSA on CPAP 7. h/o AFL s/p ablation 2015 8. Hyponatremia  Plan/Discussion:    He has progressive cardiogenic shock due to severe biventricular HF with R> L heart failure. He continues with marked volume overload but we have been unable to mobilize fluid despite dual  inotrope support and high-dose lasix.   We will place IABP and swan today in cath lab. Eliquis stopped yesterday. Now on heparin.   Continue IV lasix and dual inotropes.  Remains in Afib, rates in the 80's. Will need DCCV when stabilized - currently remains too unstable.   I discussed with Dr. Prescott Gum. 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 Could consider Impella CP but I worry RV may not be able to keep up.   CRITICAL CARE Performed by: Glori Bickers  Total critical care time: 35 minutes  Critical care time was exclusive of separately billable procedures and treating other patients.  Critical care was necessary to treat or prevent imminent or life-threatening deterioration.  Critical care was time spent personally by me (independent of midlevel providers or residents) on the following activities: development of treatment plan with patient and/or surrogate as well as nursing, discussions with consultants, evaluation of patient's response to treatment, examination of patient, obtaining history from patient or surrogate, ordering and performing treatments and  interventions, ordering and review of laboratory studies, ordering and review of radiographic studies, pulse oximetry and re-evaluation of patient's condition.   Glori Bickers, MD  10:45 AM

## 2017-02-19 ENCOUNTER — Ambulatory Visit: Payer: PPO | Admitting: Physical Therapy

## 2017-02-19 ENCOUNTER — Inpatient Hospital Stay (HOSPITAL_COMMUNITY): Payer: PPO

## 2017-02-19 ENCOUNTER — Encounter (HOSPITAL_COMMUNITY): Payer: Self-pay | Admitting: Internal Medicine

## 2017-02-19 DIAGNOSIS — I6523 Occlusion and stenosis of bilateral carotid arteries: Secondary | ICD-10-CM | POA: Diagnosis not present

## 2017-02-19 DIAGNOSIS — I255 Ischemic cardiomyopathy: Secondary | ICD-10-CM | POA: Diagnosis not present

## 2017-02-19 DIAGNOSIS — J9811 Atelectasis: Secondary | ICD-10-CM | POA: Diagnosis not present

## 2017-02-19 DIAGNOSIS — Z981 Arthrodesis status: Secondary | ICD-10-CM | POA: Diagnosis not present

## 2017-02-19 DIAGNOSIS — R54 Age-related physical debility: Secondary | ICD-10-CM | POA: Diagnosis not present

## 2017-02-19 DIAGNOSIS — Z833 Family history of diabetes mellitus: Secondary | ICD-10-CM | POA: Diagnosis not present

## 2017-02-19 DIAGNOSIS — Z452 Encounter for adjustment and management of vascular access device: Secondary | ICD-10-CM | POA: Diagnosis not present

## 2017-02-19 DIAGNOSIS — Z951 Presence of aortocoronary bypass graft: Secondary | ICD-10-CM | POA: Diagnosis not present

## 2017-02-19 DIAGNOSIS — I5033 Acute on chronic diastolic (congestive) heart failure: Secondary | ICD-10-CM | POA: Diagnosis not present

## 2017-02-19 DIAGNOSIS — K567 Ileus, unspecified: Secondary | ICD-10-CM | POA: Diagnosis not present

## 2017-02-19 DIAGNOSIS — Z79899 Other long term (current) drug therapy: Secondary | ICD-10-CM | POA: Diagnosis not present

## 2017-02-19 DIAGNOSIS — E854 Organ-limited amyloidosis: Secondary | ICD-10-CM | POA: Diagnosis not present

## 2017-02-19 DIAGNOSIS — J449 Chronic obstructive pulmonary disease, unspecified: Secondary | ICD-10-CM | POA: Diagnosis not present

## 2017-02-19 DIAGNOSIS — R69 Illness, unspecified: Secondary | ICD-10-CM | POA: Diagnosis not present

## 2017-02-19 DIAGNOSIS — I5043 Acute on chronic combined systolic (congestive) and diastolic (congestive) heart failure: Secondary | ICD-10-CM | POA: Diagnosis not present

## 2017-02-19 DIAGNOSIS — I130 Hypertensive heart and chronic kidney disease with heart failure and stage 1 through stage 4 chronic kidney disease, or unspecified chronic kidney disease: Secondary | ICD-10-CM | POA: Diagnosis not present

## 2017-02-19 DIAGNOSIS — R42 Dizziness and giddiness: Secondary | ICD-10-CM | POA: Diagnosis not present

## 2017-02-19 DIAGNOSIS — I97638 Postprocedural hematoma of a circulatory system organ or structure following other circulatory system procedure: Secondary | ICD-10-CM | POA: Diagnosis not present

## 2017-02-19 DIAGNOSIS — R778 Other specified abnormalities of plasma proteins: Secondary | ICD-10-CM | POA: Diagnosis not present

## 2017-02-19 DIAGNOSIS — R0602 Shortness of breath: Secondary | ICD-10-CM | POA: Diagnosis not present

## 2017-02-19 DIAGNOSIS — R918 Other nonspecific abnormal finding of lung field: Secondary | ICD-10-CM | POA: Diagnosis not present

## 2017-02-19 DIAGNOSIS — E1122 Type 2 diabetes mellitus with diabetic chronic kidney disease: Secondary | ICD-10-CM | POA: Diagnosis not present

## 2017-02-19 DIAGNOSIS — Z66 Do not resuscitate: Secondary | ICD-10-CM | POA: Diagnosis not present

## 2017-02-19 DIAGNOSIS — E871 Hypo-osmolality and hyponatremia: Secondary | ICD-10-CM | POA: Diagnosis not present

## 2017-02-19 DIAGNOSIS — I480 Paroxysmal atrial fibrillation: Secondary | ICD-10-CM | POA: Diagnosis not present

## 2017-02-19 DIAGNOSIS — I517 Cardiomegaly: Secondary | ICD-10-CM | POA: Diagnosis not present

## 2017-02-19 DIAGNOSIS — I4892 Unspecified atrial flutter: Secondary | ICD-10-CM | POA: Diagnosis not present

## 2017-02-19 DIAGNOSIS — Z7901 Long term (current) use of anticoagulants: Secondary | ICD-10-CM | POA: Diagnosis not present

## 2017-02-19 DIAGNOSIS — E1140 Type 2 diabetes mellitus with diabetic neuropathy, unspecified: Secondary | ICD-10-CM | POA: Diagnosis not present

## 2017-02-19 DIAGNOSIS — Z794 Long term (current) use of insulin: Secondary | ICD-10-CM | POA: Diagnosis not present

## 2017-02-19 DIAGNOSIS — I2510 Atherosclerotic heart disease of native coronary artery without angina pectoris: Secondary | ICD-10-CM | POA: Diagnosis not present

## 2017-02-19 DIAGNOSIS — Z8249 Family history of ischemic heart disease and other diseases of the circulatory system: Secondary | ICD-10-CM | POA: Diagnosis not present

## 2017-02-19 DIAGNOSIS — I252 Old myocardial infarction: Secondary | ICD-10-CM | POA: Diagnosis not present

## 2017-02-19 DIAGNOSIS — G4733 Obstructive sleep apnea (adult) (pediatric): Secondary | ICD-10-CM | POA: Diagnosis not present

## 2017-02-19 DIAGNOSIS — Z87891 Personal history of nicotine dependence: Secondary | ICD-10-CM | POA: Diagnosis not present

## 2017-02-19 DIAGNOSIS — R570 Cardiogenic shock: Secondary | ICD-10-CM | POA: Diagnosis not present

## 2017-02-19 DIAGNOSIS — M79604 Pain in right leg: Secondary | ICD-10-CM | POA: Diagnosis not present

## 2017-02-19 DIAGNOSIS — I43 Cardiomyopathy in diseases classified elsewhere: Secondary | ICD-10-CM | POA: Diagnosis not present

## 2017-02-19 DIAGNOSIS — E785 Hyperlipidemia, unspecified: Secondary | ICD-10-CM | POA: Diagnosis not present

## 2017-02-19 DIAGNOSIS — N183 Chronic kidney disease, stage 3 (moderate): Secondary | ICD-10-CM | POA: Diagnosis not present

## 2017-02-19 DIAGNOSIS — N179 Acute kidney failure, unspecified: Secondary | ICD-10-CM | POA: Diagnosis not present

## 2017-02-19 DIAGNOSIS — J90 Pleural effusion, not elsewhere classified: Secondary | ICD-10-CM | POA: Diagnosis not present

## 2017-02-19 DIAGNOSIS — E440 Moderate protein-calorie malnutrition: Secondary | ICD-10-CM | POA: Diagnosis not present

## 2017-02-19 DIAGNOSIS — I5082 Biventricular heart failure: Secondary | ICD-10-CM | POA: Diagnosis not present

## 2017-02-19 DIAGNOSIS — Z9889 Other specified postprocedural states: Secondary | ICD-10-CM | POA: Diagnosis not present

## 2017-02-19 DIAGNOSIS — Z515 Encounter for palliative care: Secondary | ICD-10-CM | POA: Diagnosis not present

## 2017-02-19 DIAGNOSIS — I5023 Acute on chronic systolic (congestive) heart failure: Secondary | ICD-10-CM | POA: Diagnosis not present

## 2017-02-19 LAB — POCT I-STAT 3, VENOUS BLOOD GAS (G3P V)
ACID-BASE EXCESS: 5 mmol/L — AB (ref 0.0–2.0)
ACID-BASE EXCESS: 5 mmol/L — AB (ref 0.0–2.0)
BICARBONATE: 29.6 mmol/L — AB (ref 20.0–28.0)
Bicarbonate: 30.2 mmol/L — ABNORMAL HIGH (ref 20.0–28.0)
O2 SAT: 52 %
O2 Saturation: 52 %
PH VEN: 7.443 — AB (ref 7.250–7.430)
TCO2: 32 mmol/L (ref 0–100)
TCO2: 31 mmol/L (ref 0–100)
pCO2, Ven: 43.3 mmHg — ABNORMAL LOW (ref 44.0–60.0)
pCO2, Ven: 43.9 mmHg — ABNORMAL LOW (ref 44.0–60.0)
pH, Ven: 7.446 — ABNORMAL HIGH (ref 7.250–7.430)
pO2, Ven: 27 mmHg — CL (ref 32.0–45.0)
pO2, Ven: 27 mmHg — CL (ref 32.0–45.0)

## 2017-02-19 LAB — COOXEMETRY PANEL
Carboxyhemoglobin: 1.2 % (ref 0.5–1.5)
Carboxyhemoglobin: 1.6 % — ABNORMAL HIGH (ref 0.5–1.5)
Methemoglobin: 1.1 % (ref 0.0–1.5)
Methemoglobin: 0.8 % (ref 0.0–1.5)
O2 SAT: 75 %
O2 SAT: 66.6 %
TOTAL HEMOGLOBIN: 13.1 g/dL (ref 12.0–16.0)
TOTAL HEMOGLOBIN: 13.8 g/dL (ref 12.0–16.0)

## 2017-02-19 LAB — GLUCOSE, CAPILLARY
GLUCOSE-CAPILLARY: 149 mg/dL — AB (ref 65–99)
GLUCOSE-CAPILLARY: 152 mg/dL — AB (ref 65–99)
Glucose-Capillary: 138 mg/dL — ABNORMAL HIGH (ref 65–99)
Glucose-Capillary: 140 mg/dL — ABNORMAL HIGH (ref 65–99)
Glucose-Capillary: 95 mg/dL (ref 65–99)

## 2017-02-19 LAB — POCT I-STAT 3, ART BLOOD GAS (G3+)
ACID-BASE EXCESS: 5 mmol/L — AB (ref 0.0–2.0)
Bicarbonate: 28.3 mmol/L — ABNORMAL HIGH (ref 20.0–28.0)
O2 SAT: 94 %
PO2 ART: 65 mmHg — AB (ref 83.0–108.0)
TCO2: 29 mmol/L (ref 0–100)
pCO2 arterial: 38.4 mmHg (ref 32.0–48.0)
pH, Arterial: 7.477 — ABNORMAL HIGH (ref 7.350–7.450)

## 2017-02-19 LAB — APTT
APTT: 69 s — AB (ref 24–36)
aPTT: 94 seconds — ABNORMAL HIGH (ref 24–36)
aPTT: 86 seconds — ABNORMAL HIGH (ref 24–36)

## 2017-02-19 LAB — BASIC METABOLIC PANEL
Anion gap: 12 (ref 5–15)
Anion gap: 10 (ref 5–15)
BUN: 41 mg/dL — ABNORMAL HIGH (ref 6–20)
BUN: 43 mg/dL — ABNORMAL HIGH (ref 6–20)
CHLORIDE: 86 mmol/L — AB (ref 101–111)
CO2: 29 mmol/L (ref 22–32)
CO2: 31 mmol/L (ref 22–32)
CREATININE: 1.69 mg/dL — AB (ref 0.61–1.24)
CREATININE: 1.66 mg/dL — AB (ref 0.61–1.24)
Calcium: 8.6 mg/dL — ABNORMAL LOW (ref 8.9–10.3)
Calcium: 8.8 mg/dL — ABNORMAL LOW (ref 8.9–10.3)
Chloride: 84 mmol/L — ABNORMAL LOW (ref 101–111)
GFR calc non Af Amer: 39 mL/min — ABNORMAL LOW (ref >60)
GFR calc non Af Amer: 40 mL/min — ABNORMAL LOW (ref >60)
GFR, EST AFRICAN AMERICAN: 45 mL/min — AB (ref >60)
GFR, EST AFRICAN AMERICAN: 46 mL/min — AB (ref >60)
Glucose, Bld: 158 mg/dL — ABNORMAL HIGH (ref 65–99)
Glucose, Bld: 139 mg/dL — ABNORMAL HIGH (ref 65–99)
POTASSIUM: 3.3 mmol/L — AB (ref 3.5–5.1)
Potassium: 2.8 mmol/L — ABNORMAL LOW (ref 3.5–5.1)
SODIUM: 125 mmol/L — AB (ref 135–145)
SODIUM: 127 mmol/L — AB (ref 135–145)

## 2017-02-19 LAB — CBC
HEMATOCRIT: 37.6 % — AB (ref 39.0–52.0)
Hemoglobin: 13.3 g/dL (ref 13.0–17.0)
MCH: 31 pg (ref 26.0–34.0)
MCHC: 35.4 g/dL (ref 30.0–36.0)
MCV: 87.6 fL (ref 78.0–100.0)
Platelets: 109 10*3/uL — ABNORMAL LOW (ref 150–400)
RBC: 4.29 MIL/uL (ref 4.22–5.81)
RDW: 18.6 % — AB (ref 11.5–15.5)
WBC: 9.2 10*3/uL (ref 4.0–10.5)

## 2017-02-19 LAB — HEPARIN LEVEL (UNFRACTIONATED)
HEPARIN UNFRACTIONATED: 2.08 [IU]/mL — AB (ref 0.30–0.70)
Heparin Unfractionated: 2.16 IU/mL — ABNORMAL HIGH (ref 0.30–0.70)

## 2017-02-19 MED ORDER — POTASSIUM CHLORIDE 20 MEQ/15ML (10%) PO SOLN
40.0000 meq | Freq: Once | ORAL | Status: AC
  Administered 2017-02-19: 40 meq via ORAL

## 2017-02-19 MED ORDER — HEPARIN (PORCINE) IN NACL 100-0.45 UNIT/ML-% IJ SOLN: 700.0000 [IU]/h | INTRAMUSCULAR | 0 refills | Status: AC

## 2017-02-19 MED ORDER — POTASSIUM CHLORIDE 20 MEQ/15ML (10%) PO SOLN: 40.0000 meq | Freq: Three times a day (TID) | ORAL | 0 refills | Status: AC

## 2017-02-19 MED ORDER — FUROSEMIDE 10 MG/ML IJ SOLN: 25.0000 mg/h | INTRAVENOUS | 0 refills | Status: AC

## 2017-02-19 MED ORDER — NOREPINEPHRINE BITARTRATE 1 MG/ML IV SOLN: 0.0000 ug/min | INTRAVENOUS | Status: AC

## 2017-02-19 MED ORDER — MILRINONE LACTATE IN DEXTROSE 20-5 MG/100ML-% IV SOLN: 0.3750 ug/kg/min | INTRAVENOUS | Status: AC

## 2017-02-19 MED ORDER — MILRINONE LACTATE IN DEXTROSE 20-5 MG/100ML-% IV SOLN
0.3750 ug/kg/min | INTRAVENOUS | Status: DC
  Administered 2017-02-19: 0.25 ug/kg/min via INTRAVENOUS
  Filled 2017-02-19: qty 100

## 2017-02-19 MED ORDER — SODIUM CHLORIDE 0.9 % IV SOLN
INTRAVENOUS | Status: DC
  Administered 2017-02-18: 21:00:00 via INTRAVENOUS

## 2017-02-19 MED ORDER — METOLAZONE 5 MG PO TABS: 5.0000 mg | ORAL_TABLET | Freq: Two times a day (BID) | ORAL | 0 refills | Status: AC

## 2017-02-19 MED ORDER — DOBUTAMINE IN D5W 4-5 MG/ML-% IV SOLN: 2.5000 ug/kg/min | INTRAVENOUS | Status: DC

## 2017-02-19 MED ORDER — ONDANSETRON HCL 4 MG/2ML IJ SOLN: 4.0000 mg | Freq: Four times a day (QID) | INTRAMUSCULAR | 0 refills | Status: AC | PRN

## 2017-02-19 MED ORDER — AMIODARONE HCL IN DEXTROSE 360-4.14 MG/200ML-% IV SOLN: 30.0000 mg/h | INTRAVENOUS | 0 refills | Status: AC

## 2017-02-19 MED ORDER — INSULIN ASPART 100 UNIT/ML ~~LOC~~ SOLN: 0.0000 [IU] | Freq: Three times a day (TID) | SUBCUTANEOUS | 11 refills | Status: AC

## 2017-02-19 MED ORDER — POLYETHYLENE GLYCOL 3350 17 G PO PACK: 17.0000 g | PACK | Freq: Every day | ORAL | 0 refills | Status: AC | PRN

## 2017-02-19 MED ORDER — SPIRONOLACTONE 25 MG PO TABS: 25.0000 mg | ORAL_TABLET | Freq: Every day | ORAL | Status: AC

## 2017-02-19 MED ORDER — FISH OIL 1000 MG PO CAPS: 2000.0000 mg | ORAL_CAPSULE | Freq: Two times a day (BID) | ORAL | 0 refills | Status: AC

## 2017-02-19 MED ORDER — POTASSIUM CHLORIDE 20 MEQ/15ML (10%) PO SOLN
40.0000 meq | Freq: Three times a day (TID) | ORAL | Status: DC
  Administered 2017-02-19 (×2): 40 meq
  Filled 2017-02-19 (×2): qty 30

## 2017-02-19 MED ORDER — ALPRAZOLAM 0.25 MG PO TABS: 0.2500 mg | ORAL_TABLET | Freq: Two times a day (BID) | ORAL | 0 refills | Status: AC | PRN

## 2017-02-19 MED FILL — Heparin Sodium (Porcine) 2 Unit/ML in Sodium Chloride 0.9%: INTRAMUSCULAR | Qty: 500 | Status: AC

## 2017-02-19 NOTE — Progress Notes (Signed)
CRITICAL VALUE ALERT  Critical value received:  Potassium 2.8  Date of notification: 02/19/17  Time of notification:  0512 Critical value read back:yes  Nurse who received alert:  Purcell Nails  MD notified (1st page): Dr Haroldine Laws  Time of first page: 0530  MD notified (2nd page):  Time of second page:  Responding MD:  Dr Jacinto Reap  Time MD responded:  [phone]

## 2017-02-19 NOTE — Care Management Note (Signed)
Case Management Note Previous CM note initiated by Bethena Roys, RN 02/13/2017, 3:08 PM   Patient Details  Name: Tommy Sullivan MRN: [id number] Date of Birth: [birth year]  Subjective/Objective: Pt presented for Acute on Chronic Systolic Heart Failure- with volume overload. Pt initiated on IV Lasix and Dobutamine gtt. Pt is from home with wife. Pt transferred to ICU 02-13-17 for Levophed gtt.                    Action/Plan: CM will continue to monitor for additional disposition needs.   Expected Discharge Date:  02/19/17               Expected Discharge Plan:  Acute to Acute Transfer  In-House Referral:  NA  Discharge planning Services  CM Consult  Post Acute Care Choice:    Choice offered to:     DME Arranged:    DME Agency:     HH Arranged:    HH Agency:     Status of Service:  Completed, signed off  If discussed at [employer] of Stay Meetings, dates discussed:   Discharge Disposition: acute to acute tx    Additional Comments:  02/19/17- 1700- Valentina Gu, CM- plan for pt to tx to [facility]- bed has become available- pt to transport via [facility]- Acute to Acute tx.   Dahlia Client Yeguada, RN 02/19/2017, 7:51 PM [phone]

## 2017-02-19 NOTE — Progress Notes (Signed)
Advanced Heart Failure Rounding Note   Subjective:    PICC placed on 5/12. Moved to ICU 5/15 for worsening shock  Remains on lasix gtt at 20 and dual inotropes with dobutamine 5 mcg, milrinone 0.125 mcg, and norepi at 13.  Coox 75% this am with IABP in place.   With ongoing severe cardiogenic shock IABP placed 02/18/17.   Out 4.6 L (negative 2.7L)  and down 6 lbs (bed weight). Foley placed for urinary retention  Remains in Afib. Rate controlled on amio.   Feeling better. Breathing easier. Occasional ab cramps.   Swan numbers  CVP 14-15 PA 32/19 PCWP 25 Thermo 3.5/1.6 MV sat 75%   Creatinine 1.39->1.44->1.48.-> 1.60 ->1.63 -> 1.69   Objective:     Vital Signs:   Temp:  [95.4 F (35.2 C)-97.9 F (36.6 C)] 97.3 F (36.3 C) (05/21 0700) Pulse Rate:  [0-101] 80 (05/21 0700) Resp:  [0-30] 12 (05/21 0700) BP: (80-123)/(47-100) 91/74 (05/21 0700) SpO2:  [0 %-100 %] 92 % (05/21 0700) Weight:  [213 lb 3 oz (96.7 kg)] 213 lb 3 oz (96.7 kg) (05/21 0530) Last BM Date: 02/11/17  Weight change: Filed Weights   02/17/17 0500 02/18/17 0500 02/19/17 0530  Weight: 220 lb 7.4 oz (100 kg) 219 lb 2.2 oz (99.4 kg) 213 lb 3 oz (96.7 kg)    Intake/Output:   Intake/Output Summary (Last 24 hours) at 02/19/17 0807 Last data filed at 02/19/17 0700  Gross per 24 hour  Intake          1876.24 ml  Output             4150 ml  Net         -2273.76 ml     Physical Exam: CVP 14-15 General: Chronically ill and fatigued appearing. NAD.  HEENT: Normal Neck: Supple. RIJ swan JVP to ear.  Carotids 2+ bilat; no bruits. No thyromegaly or nodule noted. Cor: PMI laterally displaced. Irregular with + S3 Lungs: Diminished throughout with crackles in bases.  Abdomen: soft, non-tender, much less distended, no HSM. No bruits or masses. Hypoactive bowel sounds.  Extremities: no cyanosis, clubbing, or rash. 3+ edema into thighs. + unna boots.  IABP R groin ok.  Neuro: alert & orientedx3,  cranial nerves grossly intact. moves all 4 extremities w/o difficulty. Affect appropriate.   Telemetry: Personally reviewed, AF 80-90s with PVCs   Labs: Basic Metabolic Panel:  Recent Labs Lab 02/14/17 0346  02/16/17 0351 02/17/17 0459 02/18/17 0517 02/18/17 2155 02/19/17 0430  NA 130*  < > 127* 126* 125* 126* 125*  K 3.3*  < > 3.2* 3.5 3.1* 2.4* 2.8*  CL 92*  < > 86* 85* 82* 84* 84*  CO2 27  < > 28 29 28 28 29   GLUCOSE 160*  < > 145* 153* 113* 138* 158*  BUN 40*  < > 38* 42* 44* 42* 41*  CREATININE 1.39*  < > 1.48* 1.60* 1.63* 1.65* 1.69*  CALCIUM 8.5*  < > 8.8* 8.9 9.0 8.5* 8.6*  MG 1.8  --   --  2.0  --   --   --   < > = values in this interval not displayed.  Liver Function Tests: No results for input(s): AST, ALT, ALKPHOS, BILITOT, PROT, ALBUMIN in the last 168 hours. No results for input(s): LIPASE, AMYLASE in the last 168 hours. No results for input(s): AMMONIA in the last 168 hours.  CBC:  Recent Labs Lab 02/13/17 0400 02/14/17 0346 02/19/17  0430  WBC 5.8 6.5 9.2  HGB 13.3 13.8 13.3  HCT 38.3* 39.5 37.6*  MCV 89.1 89.4 87.6  PLT 112* 118* 109*    Cardiac Enzymes: No results for input(s): CKTOTAL, CKMB, CKMBINDEX, TROPONINI in the last 168 hours.  BNP: BNP (last 3 results)  Recent Labs  08/03/16 1427 12/13/16 1051 02/09/17 1151  BNP 485.2* 1,497.5* 2,071.9*    ProBNP (last 3 results) No results for input(s): PROBNP in the last 8760 hours.     Medications:     Scheduled Medications: . Chlorhexidine Gluconate Cloth  6 each Topical Q0600  . cholecalciferol  1,000 Units Oral Daily  . insulin aspart  0-15 Units Subcutaneous TID WC  . insulin glargine  5 Units Subcutaneous QHS  . latanoprost  1 drop Left Eye QHS  . metolazone  5 mg Oral BID  . modafinil  200 mg Oral Daily  . multivitamin with minerals   Oral Daily  . omega-3 acid ethyl esters  2 g Oral BID  . potassium chloride  40 mEq Per Tube BID  . rosuvastatin  10 mg Oral q1800  .  sodium chloride flush  10-40 mL Intracatheter Q12H  . sodium chloride flush  3 mL Intravenous Q12H  . spironolactone  25 mg Oral Daily    Infusions: . sodium chloride    . sodium chloride 10 mL/hr at 02/19/17 0700  . amiodarone 30 mg/hr (02/19/17 0700)  . DOBUTamine 5 mcg/kg/min (02/19/17 0700)  . furosemide (LASIX) infusion 20 mg/hr (02/19/17 0700)  . heparin 800 Units/hr (02/19/17 0700)  . milrinone 0.125 mcg/kg/min (02/19/17 0700)  . norepinephrine (LEVOPHED) Adult infusion 13.013 mcg/min (02/19/17 0700)    PRN Medications: sodium chloride, acetaminophen, ALPRAZolam, ondansetron (ZOFRAN) IV, polyethylene glycol, sodium chloride flush, sodium chloride flush   Assessment:   1. Acute on chronic systolic HF with marked volume overload and R>>L symptoms     --Echo 4/18 EF 40-45%. Mild RV dysfunction Moderate MR, moderate to severe TR    --Echo 5/13 EF 20-25%RV severely down     --Initial co-ox 37%  2. Cardiogenic shock 3. PAF, recurrent    --amio started on admit 4. 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 Acute on CKD, III 6. OSA on CPAP 7. h/o AFL s/p ablation 2015 8. Hyponatremia 9. Hypokalemia.  10. Urinary retention - Foley placed 5/20  Plan/Discussion:    IABP placed 02/18/17 with progressive cardiogenic shock due to severe biventricular HF with R>L HF.   Remains markedly volume overloaded, but diuresis improved with additional support from IABP. Continue high dose lasix gtt at 20 mg/hr.  Continue to diuresis  Eliquis stopped. Now on heparin.   Continue IV lasix and dual inotrope + pressors. K 2.4 -> 2.8 with 40 meq BID. Will increase supp today.   Remains in Afib though rate controlled in 80s. Will need DCCV once/if stabilized.   Not candidate for VAD or impella CP with RV failure.   Patient is critically ill and remains in imminent danger of multiple organ failure.  Prognosis extremely guarded.   Shirley Friar, PA-C  8:07 AM   Advanced Heart Failure Team Pager  [phone] (M-F; 7a - 4p)  Please contact [facility] for night-coverage after hours (4p -7a ) and weekends on [email or web address]   Patient seen and examined with the above-signed Advanced Practice Provider and/or Housestaff. I personally reviewed laboratory data, imaging studies and relevant notes. I independently examined the patient and formulated the important aspects of the plan. I have  edited the note to reflect any of my changes or salient points. I have personally discussed the plan with the patient and/or family.  Remains extremely tenuous but finally starting to make a bit of progress after IABP placed and milrinone added.   Luiz Blare numbers reviewed personally. Based on MV sat suspect thermo numbers are underestimated. Will continue norepi. Wean dobutamine to 2.5 and increase milrinone to 0.25. Given degree of RV failure, I think only hope for him is transplant but age may be prohibitive. Will discuss with [facility] today.    On exam Lying flat in bed RIJ swan COR irr +s3 Ab soft NT  Ext much warmer. 3+ edema. IAPB site ok.   Continue IABP. Adjust inotropes. Continue IV lasix. Supp K. Discuss with [facility] re Transplant candidacy.  CRITICAL CARE Performed by: Glori Bickers  Total critical care time: 45 minutes  Critical care time was exclusive of separately billable procedures and treating other patients.  Critical care was necessary to treat or prevent imminent or life-threatening deterioration.  Critical care was time spent personally by me (independent of midlevel providers or residents) on the following activities: development of treatment plan with patient and/or surrogate as well as nursing, discussions with consultants, evaluation of patient's response to treatment, examination of patient, obtaining history from patient or surrogate, ordering and performing treatments and interventions, ordering and review of laboratory studies, ordering and review of radiographic studies, pulse oximetry  and re-evaluation of patient's condition.  Glori Bickers, MD  9:29 AM

## 2017-02-19 NOTE — Progress Notes (Signed)
CRITICAL VALUE ALERT  Critical value received: potassium 2.4  Date of notification: 02/18/17  Time of notification:  2234  Critical value read back:yes  Nurse who received alert:  Purcell Nails  MD notified (1st page): [place] Time of first page: 2235  MD notified (2nd page):  Time of second page:  Responding MD:  Bensimhon  Time MD responded:  [id number]

## 2017-02-19 NOTE — Progress Notes (Addendum)
ANTICOAGULATION CONSULT NOTE - Follow up Jack for heparin Indication: atrial fibrillation/IABP  No Known Allergies  Patient Measurements: Height: 5\' 9"  (175.3 cm) Weight: 213 lb 3 oz (96.7 kg) IBW/kg (Calculated) : 70.7 Heparin Dosing Weight: 88kg  Vital Signs: Temp: 97.3 F (36.3 C) (05/21 1800) BP: 98/70 (05/21 1800) Pulse Rate: 71 (05/21 1800)  Labs:  Recent Labs  02/18/17 0517  02/18/17 2155 02/19/17 0430 02/19/17 0656 02/19/17 0807 02/19/17 1159 02/19/17 1828  HGB  --   --   --  13.3  --   --   --   --   HCT  --   --   --  37.6*  --   --   --   --   PLT  --   --   --  109*  --   --   --   --   APTT 101*  < >  --  94*  --  86*  --  69*  HEPARINUNFRC >2.20*  --   --  2.16* 2.08*  --   --   --   CREATININE 1.63*  --  1.65* 1.69*  --   --  1.66*  --   < > = values in this interval not displayed.  Estimated Creatinine Clearance: 46.8 mL/min (A) (by C-G formula based on SCr of 1.66 mg/dL (H)).   Medical History: Past Medical History:  Diagnosis Date  . Allergy   . Arthritis   . Atrial flutter ([place])    a/ 05/2014 s/p RFCA.  Marland Kitchen CAD (coronary artery disease) 10/2007   a. 10/2007 s/p MI/PCI of RCA [facility] - Dr. Collene Mares);  b. 06/2016 MV: EF 34%, inf/inflat infarct; c. 07/2016 Cath: LM nl, LAd 90ost/prox, 96m, 99d, D2 30, RI small, LCX 140m, Om1 40, RCA 50ost, 5 ISR, 37m, 30d, RPDA  80ost, RPL2 70, EF 45-50%;  d. 07/2016 CABG x 5 (LIMA->LAD, VG->Diag, VG->RI, VG->PDA->RPL).  . Chronic systolic CHF (congestive heart failure) ([place])    a. 07/2016 Echo: Ef 30-35%.  . Diabetes mellitus without complication ([place])   . Extrinsic asthma, unspecified   . Glaucoma   . Gout   . Hyperlipidemia   . Hypertensive heart disease   . Ischemic cardiomyopathy    a. 07/2016 Echo: Ef 30-35%, mildly dil LA, mild MR/TR, PASP 24 mmHg.  Marland Kitchen Post-op Afib following CABG (07/2016)    a. 07/2016 - placed on amio post-op.  Marland Kitchen Retinal vascular occlusion, unspecified    Right  .  Sleep apnea     Assessment: 70 yo male previously on apixaban for atrial fibrillation (last dose ~1000 5/19). Pharmacy consulted to dose heparin with afib and now IABP  -aPTT= 69 this evening. Some oozing at IABP sheath site, but no other overt bleeding issues noted.  Goal of Therapy:  0.2-0.5 anti-Xa level, aPTT 50-85 Monitor platelets by anticoagulation protocol: Yes   Plan:  Continue IV heparin at current rate. Patient planning to transfer to [place] shortly.  Uvaldo Rising, BCPS  Clinical Pharmacist Pager [phone]  02/19/2017 7:21 PM

## 2017-02-19 NOTE — Progress Notes (Signed)
ANTICOAGULATION CONSULT NOTE - Follow up [place] for heparin Indication: atrial fibrillation/IABP  No Known Allergies  Patient Measurements: Height: 5\' 9"  (175.3 cm) Weight: 213 lb 3 oz (96.7 kg) IBW/kg (Calculated) : 70.7 Heparin Dosing Weight: 88kg  Vital Signs: Temp: 97.3 F (36.3 C) (05/21 0700) BP: 91/74 (05/21 0700) Pulse Rate: 80 (05/21 0700)  Labs:  Recent Labs  02/18/17 0517 02/18/17 2100 02/18/17 2155 02/19/17 0430 02/19/17 0656  HGB  --   --   --  13.3  --   HCT  --   --   --  37.6*  --   PLT  --   --   --  109*  --   APTT 101* 67*  --  94*  --   HEPARINUNFRC >2.20*  --   --  2.16* 2.08*  CREATININE 1.63*  --  1.65* 1.69*  --     Estimated Creatinine Clearance: 46 mL/min (A) (by C-G formula based on SCr of 1.69 mg/dL (H)).   Medical History: Past Medical History:  Diagnosis Date  . Allergy   . Arthritis   . Atrial flutter ([place])    a/ 05/2014 s/p RFCA.  Marland Kitchen CAD (coronary artery disease) 10/2007   a. 10/2007 s/p MI/PCI of RCA [facility] - Dr. Collene Mares);  b. 06/2016 MV: EF 34%, inf/inflat infarct; c. 07/2016 Cath: LM nl, LAd 90ost/prox, 74m, 99d, D2 30, RI small, LCX 131m, Om1 40, RCA 50ost, 5 ISR, 69m, 30d, RPDA  80ost, RPL2 70, EF 45-50%;  d. 07/2016 CABG x 5 (LIMA->LAD, VG->Diag, VG->RI, VG->PDA->RPL).  . Chronic systolic CHF (congestive heart failure) ([place])    a. 07/2016 Echo: Ef 30-35%.  . Diabetes mellitus without complication ([place])   . Extrinsic asthma, unspecified   . Glaucoma   . Gout   . Hyperlipidemia   . Hypertensive heart disease   . Ischemic cardiomyopathy    a. 07/2016 Echo: Ef 30-35%, mildly dil LA, mild MR/TR, PASP 24 mmHg.  Marland Kitchen Post-op Afib following CABG (07/2016)    a. 07/2016 - placed on amio post-op.  Marland Kitchen Retinal vascular occlusion, unspecified    Right  . Sleep apnea     Assessment: 71 yo male previously on apixaban for atrial fibrillation (last dose ~1000 5/19). Pharmacy consulted to dose heparin with afib and now  IABP  -aPTT= 86 this am, heparin levels still above 2 given recent apixaban exposure. Urine clear, no bleeding issues noted, hgb wnl.  Goal of Therapy:  0.2-0.5 anti-Xa level, aPTT 50-85 Monitor platelets by anticoagulation protocol: Yes   Plan:  Decrease heparin to 700/hr Recheck aptt tonight to confirm  Erin Hearing PharmD., BCPS Clinical Pharmacist Pager [phone] 02/19/2017 9:02 AM

## 2017-02-19 NOTE — Discharge Summary (Signed)
Advanced Heart Failure Discharge Note  Discharge Summary   Patient ID: Tommy Sullivan MRN: [id number], DOB/AGE: 1946-07-12 71 y.o. Admit date: 02/09/2017 D/C date:     02/19/2017   Primary Discharge Diagnoses:  1. Acute on chronic systolic HF with marked volume overload and R>>L symptoms 2. Cardiogenic shock 3. PAF, recurrent 4. CAD s/p CABG 07/2016 5. ARF on CKD III 6. OSA on CPAP 7. H/o AFL s/p ablation 2015 8. Hyponatremia 9. Hypokalemia 10. Urinary retention - foley placed 02/18/17 11. Blood Type A Pos  Hospital Course:   Tommy Sullivan is a 71 y.o. male with history of CAD s/p CABG, moderate COPD, DM2, OSA on CPAP, HTN, AFL s/p Ablation 2015, and chronic systolic CHF with EF 56-43% by echo 02/11/17.  Underwent CABG in 10/17. Since that time has had very difficult course with persistent HF symptoms and progressive volume overload. EF had been 40-45%. Initially had some improvement with diuresis but then got dramatically worse. Noted to be in AF about 2 weeks ago. Started on Eliquis by Dr. Fletcher Anon.   Pt admitted from HF clinic 02/09/17 with marked volume overload and hypotension. Started on IV lasix and amiodarone. Had poor initial response to IV lasix. PICC placed and lasix increased. Initial mixed venous saturation 37% and CVP 18. Started on milrinone at 0.25 mcg/kg/min. Not thought stable for TEE/DC-CV  Mixed venous saturation remained marginal and continued to have poor urine output so switched to dobutamine 5 mcg on 02/11/17. Urine output picked up slightly.  Echo EF 20-25% with severe RV dysfunction. Pressures again dropped on 02/13/17 so moved to ICU for addition of norepi. Pressors and inotrope support increase up to Dobutamine 7.5 mcg and norepi up to 20.  With continued poor urine output, Lasix switched to continuous gtt up to 30 mg/hr but urine output remained poor. MV sat dropped to 47% so taken for RHC 02/18/17 with PAPi and RA/PCWP indicative of severe RV failure which  was confirmed by lack of pulsatility in RV tracing.  IABP placed for additional support.   [place] 02/18/17 on dobutamine 7.5 and norepi 15 RA = 24 RV = 35/23 PA = 37/22 (28) PCW = 25 Fick cardiac output/index = 3.5/1.6 PVR = 0.9 WU FA sat = 94% PA sat = 52%, 52% PAPI = 0.625 RA/PCWP = 0.96  Upon return from the cath lab pt reported persistent abdominal pain and cramping (which had been present prior to cath and IABP insertion). Hypoactive BS noted with diffuse tenderness but no rebound.  NG tube placed for decompression with 100-200 cc of clear fluid removed with some improvement. KUB showd markedly dilated loops of bowel throughout with stool in rectal vault. No clear transition point noted. Thought to possible be ileus due to low output HF. Eventually found to be in urinary retention and Foley placed with > 1L out and marked relief of his symptoms.   Marked improvement with IABP with dramatic increase in urine output. Patient not felt to be VAD candidate due to RV failure so case discussed with Dr. Mosetta Pigeon from [facility] who agreed to transfer to [facility] for possible transplant evaluation.     [facility] accepted patient for transfer and bed became available 02/19/17. Pt will be transported with IABP in place by [facility] once truck becomes available.   Please note patients Eliquis is on hold.    Discharge Weight : 213 lbs Discharge Vitals: Blood pressure 101/74, pulse 79, temperature 97.2 F (36.2 C), resp. rate 15, height 5\' 9"  (  1.753 m), weight 213 lb 3 oz (96.7 kg), SpO2 94 %.  Labs: Lab Results  Component Value Date   WBC 9.2 02/19/2017   HGB 13.3 02/19/2017   HCT 37.6 (L) 02/19/2017   MCV 87.6 02/19/2017   PLT 109 (L) 02/19/2017     Recent Labs Lab 02/19/17 1159  NA 127*  K 3.3*  CL 86*  CO2 31  BUN 43*  CREATININE 1.66*  CALCIUM 8.8*  GLUCOSE 139*   Lab Results  Component Value Date   CHOL 95 11/01/2016   HDL 20.00 (L) 11/01/2016   LDLCALC 58 11/01/2016   TRIG 84.0 11/01/2016    BNP (last 3 results)  Recent Labs  08/03/16 1427 12/13/16 1051 02/09/17 1151  BNP 485.2* 1,497.5* 2,071.9*    ProBNP (last 3 results) No results for input(s): PROBNP in the last 8760 hours.   Diagnostic Studies/Procedures   Dg Chest Port 1 View  Result Date: 02/19/2017 CLINICAL DATA:  Shortness of breath. EXAM: PORTABLE CHEST 1 VIEW COMPARISON:  02/10/2017. FINDINGS: NG tube noted with tip below left hemidiaphragm. Swan-Ganz catheter noted with tip projected over superior vena cava. Right PICC line noted with tip over the superior vena cava. Aortic balloon pump tip noted just below the aortic arch. Prior CABG. Cardiomegaly with very mild perihilar interstitial prominence. Small left pleural effusion. Mild CHF cannot be completely excluded. Low lung volumes. IMPRESSION: 1. NG tube, Swan-Ganz catheter, right PICC line noted in good anatomic position. Aortic balloon pump noted catheter noted with its tip just below the aortic arch. 2. Prior CABG. Cardiomegaly with very mild bilateral perihilar interstitial prominence of small left pleural effusions suggesting very mild changes of CHF. Electronically Signed   By: Marcello Moores  Register   On: 02/19/2017 07:35   Dg Abd Portable 1v  Result Date: 02/18/2017 CLINICAL DATA:  Ileus.  NG tube placement EXAM: PORTABLE ABDOMEN - 1 VIEW COMPARISON:  None. FINDINGS: NG tube tip is in the proximal stomach with the side port near the GE junction. Gaseous distention of bowel. No free air. IMPRESSION: NG tube tip in the proximal stomach with the side port near the GE junction. Electronically Signed   By: Rolm Baptise M.D.   On: 02/18/2017 15:29   RHC 02/18/17  RA = 24 RV = 35/23 PA = 37/22 (28) PCW = 25 Fick cardiac output/index = 3.5/1.6 PVR = 0.9 WU FA sat = 94% PA sat = 52%, 52% PAPI = 0.625 RA/PCWP = 0.96   Discharge Medications   Allergies as of 02/19/2017   No Known Allergies     Medication List    STOP taking these medications   apixaban  5 MG Tabs tablet Commonly known as:  ELIQUIS   metoprolol succinate 25 MG 24 hr tablet Commonly known as:  TOPROL-XL   Potassium Chloride ER 20 MEQ Tbcr Replaced by:  potassium chloride 20 MEQ/15ML (10%) Soln   torsemide 20 MG tablet Commonly known as:  DEMADEX     TAKE these medications   ALPRAZolam 0.25 MG tablet Commonly known as:  XANAX Take 1 tablet (0.25 mg total) by mouth 2 (two) times daily as needed for anxiety.   amiodarone 360-4.14 MG/200ML-% Soln Commonly known as:  NEXTERONE PREMIX Inject 16.67 mL/hr into the vein continuous.   fenofibrate 160 MG tablet Take 1 tablet (160 mg total) by mouth daily.   Fish Oil 1000 MG Caps Take 2 capsules (2,000 mg total) by mouth 2 (two) times daily. What changed:  how much  to take   furosemide 250 mg in dextrose 5 % 225 mL Inject 25 mg/hr into the vein continuous.   heparin 100-0.45 UNIT/ML-% infusion Inject 700 Units/hr into the vein continuous.   insulin aspart 100 UNIT/ML injection Commonly known as:  novoLOG Inject 0-15 Units into the skin 3 (three) times daily with meals.   LANTUS SOLOSTAR 100 UNIT/ML Solostar Pen Generic drug:  Insulin Glargine Inject 5 Units into the skin 2 (two) times daily.   latanoprost 0.005 % ophthalmic solution Commonly known as:  XALATAN Place 1 drop into the left eye at bedtime.   metolazone 5 MG tablet Commonly known as:  ZAROXOLYN Take 1 tablet (5 mg total) by mouth 2 (two) times daily.   milrinone 20 MG/100 ML Soln infusion Commonly known as:  PRIMACOR Inject 36.2625 mcg/min into the vein continuous.   modafinil 200 MG tablet Commonly known as:  PROVIGIL Take 1 tablet (200 mg total) by mouth daily.   MULTIVITAMIN MEN PO Take 1 tablet by mouth daily.   norepinephrine 16 mg in dextrose 5 % 250 mL Inject 0-40 mcg/min into the vein continuous.   ondansetron 4 MG/2ML Soln injection Commonly known as:  ZOFRAN Inject 2 mLs (4 mg total) into the vein every 6 (six) hours as  needed for nausea.   polyethylene glycol packet Commonly known as:  MIRALAX / GLYCOLAX Take 17 g by mouth daily as needed for mild constipation or moderate constipation.   potassium chloride 20 MEQ/15ML (10%) Soln Place 30 mLs (40 mEq total) into feeding tube 3 (three) times daily. Replaces:  Potassium Chloride ER 20 MEQ Tbcr   rosuvastatin 10 MG tablet Commonly known as:  CRESTOR Take 10 mg by mouth every night   spironolactone 25 MG tablet Commonly known as:  ALDACTONE Take 1 tablet (25 mg total) by mouth daily. Start taking on:  02/20/2017 What changed:  how much to take   Vitamin D3 1000 units Caps Take 1,000 Units by mouth daily.       Disposition   The patient will be discharged in tenuous but stable condition to [facility]     Duration of Discharge Encounter: Greater than 35 minutes   Signed, Teandre, [place] 02/19/2017, 5:38 PM   I have edited summary with my findings.   Glori Bickers, MD  11:39 PM

## 2017-02-19 NOTE — Consult Note (Signed)
   [facility] CM Inpatient Consult   02/19/2017  Tommy Sullivan Nov 23, 1945 [id number]    [facility] Care Management follow up for potential [facility] services. Chart reviewed. [name] remains in ICU and noted prognosis is guarded. Spoke with inpatient RNCM. Will continue to follow along and engage for potential [facility] Care Management services when appropriate.   Marthenia Rolling, MSN-Ed, RN,BSN [facility] Liaison [phone]

## 2017-02-20 NOTE — Assessment & Plan Note (Signed)
Undergoing ENT work up.

## 2017-02-20 NOTE — Assessment & Plan Note (Signed)
Trial of OTC allergy meds to treat congestion and possible connection with auditory changes. Although doubtful connected.

## 2017-02-20 NOTE — Assessment & Plan Note (Addendum)
Adequate control with weight loss. Given running low at itmes.. Decrease insulin to 5 units once daily.  Goal FBS 90-120, 2 hour after meals <180.  Call if above these goals regularly.  If still having lows. < 70.. Call ASAP.  Get glucose tablets to treat lows.

## 2017-02-21 ENCOUNTER — Ambulatory Visit: Payer: PPO

## 2017-02-21 DIAGNOSIS — E854 Organ-limited amyloidosis: Secondary | ICD-10-CM | POA: Diagnosis not present

## 2017-02-21 DIAGNOSIS — R69 Illness, unspecified: Secondary | ICD-10-CM | POA: Diagnosis not present

## 2017-02-21 DIAGNOSIS — I43 Cardiomyopathy in diseases classified elsewhere: Secondary | ICD-10-CM | POA: Diagnosis not present

## 2017-02-23 DIAGNOSIS — I43 Cardiomyopathy in diseases classified elsewhere: Secondary | ICD-10-CM | POA: Diagnosis not present

## 2017-02-23 DIAGNOSIS — E854 Organ-limited amyloidosis: Secondary | ICD-10-CM | POA: Diagnosis not present

## 2017-02-27 ENCOUNTER — Telehealth: Payer: Self-pay | Admitting: Cardiovascular Disease

## 2017-02-27 NOTE — Telephone Encounter (Signed)
Spoke with patient's wife. Patient is still in the hospital at [facility]. He was transferred out or ICU yesterday. Being considered for heart transplant.  She states they are still ruling things out such as multiple myeloma and amyloidosis. Patient was transferred from [facility] to [facility] around 02/19/17. She wanted to make Dr Fletcher Anon aware.

## 2017-02-27 NOTE — Telephone Encounter (Signed)
Pt wife called to cancel pt appt for 6/5,. States they are treating him for amyloids on his heart.

## 2017-02-28 ENCOUNTER — Ambulatory Visit: Payer: PPO | Admitting: Physical Therapy

## 2017-02-28 NOTE — Telephone Encounter (Signed)
Ok. I reviewed his chart. Thank them for the update.

## 2017-03-01 ENCOUNTER — Encounter (HOSPITAL_COMMUNITY): Payer: PPO | Admitting: Internal Medicine

## 2017-03-01 ENCOUNTER — Ambulatory Visit: Payer: PPO | Admitting: Physical Therapy

## 2017-03-03 DIAGNOSIS — I5033 Acute on chronic diastolic (congestive) heart failure: Secondary | ICD-10-CM | POA: Diagnosis not present

## 2017-03-03 DIAGNOSIS — I43 Cardiomyopathy in diseases classified elsewhere: Secondary | ICD-10-CM | POA: Diagnosis not present

## 2017-03-03 DIAGNOSIS — R570 Cardiogenic shock: Secondary | ICD-10-CM | POA: Diagnosis not present

## 2017-03-05 ENCOUNTER — Encounter: Payer: Self-pay | Admitting: Internal Medicine

## 2017-03-05 ENCOUNTER — Telehealth (HOSPITAL_COMMUNITY): Payer: Self-pay | Admitting: *Deleted

## 2017-03-05 ENCOUNTER — Ambulatory Visit: Payer: PPO | Admitting: Physical Therapy

## 2017-03-05 ENCOUNTER — Encounter (HOSPITAL_COMMUNITY): Payer: Self-pay | Admitting: Internal Medicine

## 2017-03-05 DIAGNOSIS — I43 Cardiomyopathy in diseases classified elsewhere: Secondary | ICD-10-CM | POA: Diagnosis not present

## 2017-03-05 DIAGNOSIS — E851 Neuropathic heredofamilial amyloidosis: Secondary | ICD-10-CM | POA: Diagnosis not present

## 2017-03-05 DIAGNOSIS — I5033 Acute on chronic diastolic (congestive) heart failure: Secondary | ICD-10-CM | POA: Diagnosis not present

## 2017-03-05 DIAGNOSIS — R570 Cardiogenic shock: Secondary | ICD-10-CM | POA: Diagnosis not present

## 2017-03-05 NOTE — Telephone Encounter (Signed)
Pt's wife called to let us know pt was d/c' d from [place] on Friday and [place] as [place] is where he wanted to go.  She states [facility] was supposed to set up a lot of things for them but it has not been done correctly and she can not get them on the phone to help her.  She states the medical supply store can not deliver the hospital bed b/c they are out of network with pt's insurance and she has not been able to find anywhere else to get one.  Contacted [facility] in [place] they can deliver hospital bed, pt's records, order for bed and letter from Dr Haroldine Laws faxed to them at [phone].  They are very grateful for the help

## 2017-03-06 ENCOUNTER — Telehealth: Payer: Self-pay

## 2017-03-06 ENCOUNTER — Ambulatory Visit: Payer: PPO | Admitting: Cardiovascular Disease

## 2017-03-06 DIAGNOSIS — I5033 Acute on chronic diastolic (congestive) heart failure: Secondary | ICD-10-CM | POA: Diagnosis not present

## 2017-03-06 DIAGNOSIS — E854 Organ-limited amyloidosis: Secondary | ICD-10-CM | POA: Diagnosis not present

## 2017-03-06 DIAGNOSIS — I43 Cardiomyopathy in diseases classified elsewhere: Secondary | ICD-10-CM | POA: Diagnosis not present

## 2017-03-06 DIAGNOSIS — R570 Cardiogenic shock: Secondary | ICD-10-CM | POA: Diagnosis not present

## 2017-03-06 NOTE — Telephone Encounter (Signed)
Called and discussed pt care in detail.  No particular additional home health services needed at this point.  Offered any support as needed as it comes up.

## 2017-03-06 NOTE — Telephone Encounter (Signed)
Enid Derry (DPR signed) called to let Dr Diona Browner be aware that pt has been at [facility] and found out that he was not a candidate for heart transplant; pt was discharged from [facility] on 03/03/17 with dopamine infusion; pt is going to be entered into hospice at some point. If pt is on dopamine pump pt cannot go on hospice. Pt wants to stay on dopamine pump until he can see some of his family. Enid Derry cannot get assistance with care of pt until transitions off pump and onto hospice. Enid Derry said the pt is staying with her in [place] presently. Enid Derry wanted Dr Diona Browner to be aware of pts situation if gets call from hospice. If any questions can call Enid Derry. FYI to Dr Diona Browner.

## 2017-03-07 ENCOUNTER — Ambulatory Visit: Payer: PPO | Admitting: Physical Therapy

## 2017-03-12 ENCOUNTER — Telehealth: Payer: Self-pay

## 2017-03-12 NOTE — Telephone Encounter (Signed)
PLEASE NOTE: All timestamps contained within this report are represented as [place] Standard Time. CONFIDENTIALTY NOTICE: This fax transmission is intended only for the addressee. It contains information that is legally privileged, confidential or otherwise protected from use or disclosure. If you are not the intended recipient, you are strictly prohibited from reviewing, disclosing, copying using or disseminating any of this information or taking any action in reliance on or regarding this information. If you have received this fax in error, please notify us immediately by telephone so that we can arrange for its return to [place]. Phone: [phone], Toll-Free: [phone], Fax: [phone] Page: 1 of 1 Call Id: [id number] [place] Patient Name: Tommy Sullivan Gender: Male DOB: [birth year] Age: 71 Y 2 M 3 D Return Phone Number: [phone] (Primary) City/State/Zip: [place] Client [place] Night - Client Client Site [place] Physician Eliezer Lofts - MD Who Is Calling Patient / Member / Family / Caregiver Call Type Triage / Clinical Caller Name Hayley Relationship To Patient Other Return Phone Number [phone] (Primary) Chief Complaint DEATH - has occurred or is imminent or pending Reason for Call Symptomatic / Request for [place] states she needs to talk to someone about a patient who has passed away. Nurse Assessment Nurse: Neil Crouch RN, [place] Janus Date/Time Eilene Ghazi Time): Mar 16, 2017 1:01:18 AM Confirm and document reason for call. If symptomatic, describe symptoms. ---Caller states she needs to talk to someone about a patient who has passed away. Guidelines Guideline Title Affirmed Question Disp. Time Eilene Ghazi Time) Disposition Final User 2017-03-16 1:19:50 AM Clinical Call Yes Neil Crouch, RN,  [place] Phone DateTime Action Result/Outcome Notes Alysia Penna - MD [phone] 03/16/2017 1:02:37 AM Doctor Paged Paged On Call Back to Call Center please call gail at [phone] Alysia Penna - MD [phone] 03/16/2017 1:15:42 AM Doctor Paged Called On Call Provider - Left Message Alysia Penna - MD 16-Mar-2017 1:19:39 AM Message Result Spoke with On Call - General warm transferred to the caller.

## 2017-04-01 DEATH — deceased

## 2017-04-30 ENCOUNTER — Other Ambulatory Visit: Payer: PPO

## 2017-05-01 ENCOUNTER — Encounter: Payer: PPO | Admitting: Family Medicine

## 2017-05-21 ENCOUNTER — Other Ambulatory Visit: Payer: PPO

## 2017-05-24 ENCOUNTER — Encounter: Payer: PPO | Admitting: Family Medicine

## 2017-08-08 ENCOUNTER — Encounter: Payer: PPO | Admitting: Cardiothoracic Surgery

## 2017-08-16 IMAGING — CR DG CHEST 1V PORT
1 series · 1 of 1 positions shown · non-contrast
Comparison: 07/15/2016

CLINICAL DATA: CABG.

EXAM:
PORTABLE CHEST 1 VIEW

[AP]
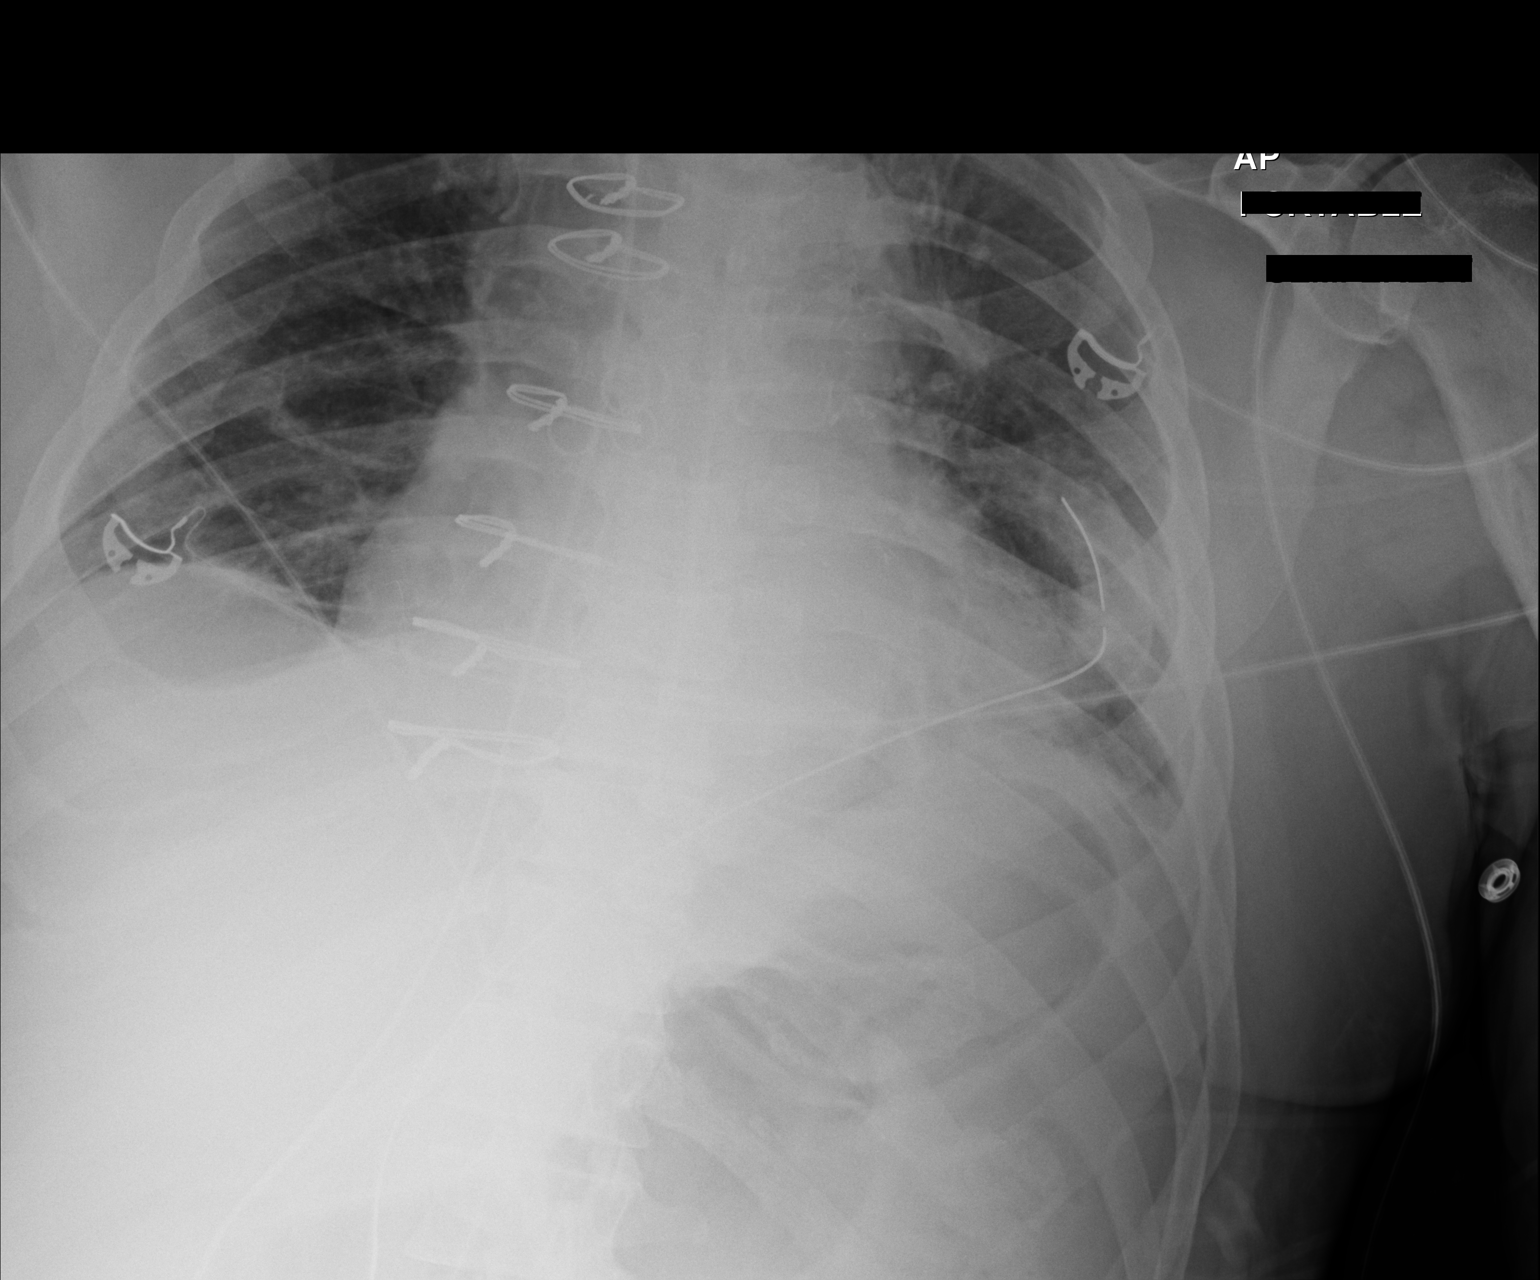

[1 of 1 positions shown; findings below may reference images not displayed]

FINDINGS: 2121 hours. Pulmonary artery catheter has been removed in the
interval with right IJ sheath visualized. Left chest tube remains in
place without evidence for left-sided pneumothorax. Midline
mediastinal/ pericardial drain again noted. The cardio pericardial
silhouette is enlarged. Vascular congestion noted with bibasilar
atelectasis. Gaseous bowel distention noted left upper quadrant.
IMPRESSION: 1. Interval removal of pulmonary artery catheter.
2. Otherwise stable exam.

## 2017-08-19 IMAGING — CR DG CHEST 1V PORT
1 series · 1 of 1 positions shown · non-contrast
Comparison: 07/18/2016

CLINICAL DATA: Shortness of breath.

EXAM:
PORTABLE CHEST 1 VIEW

[AP]
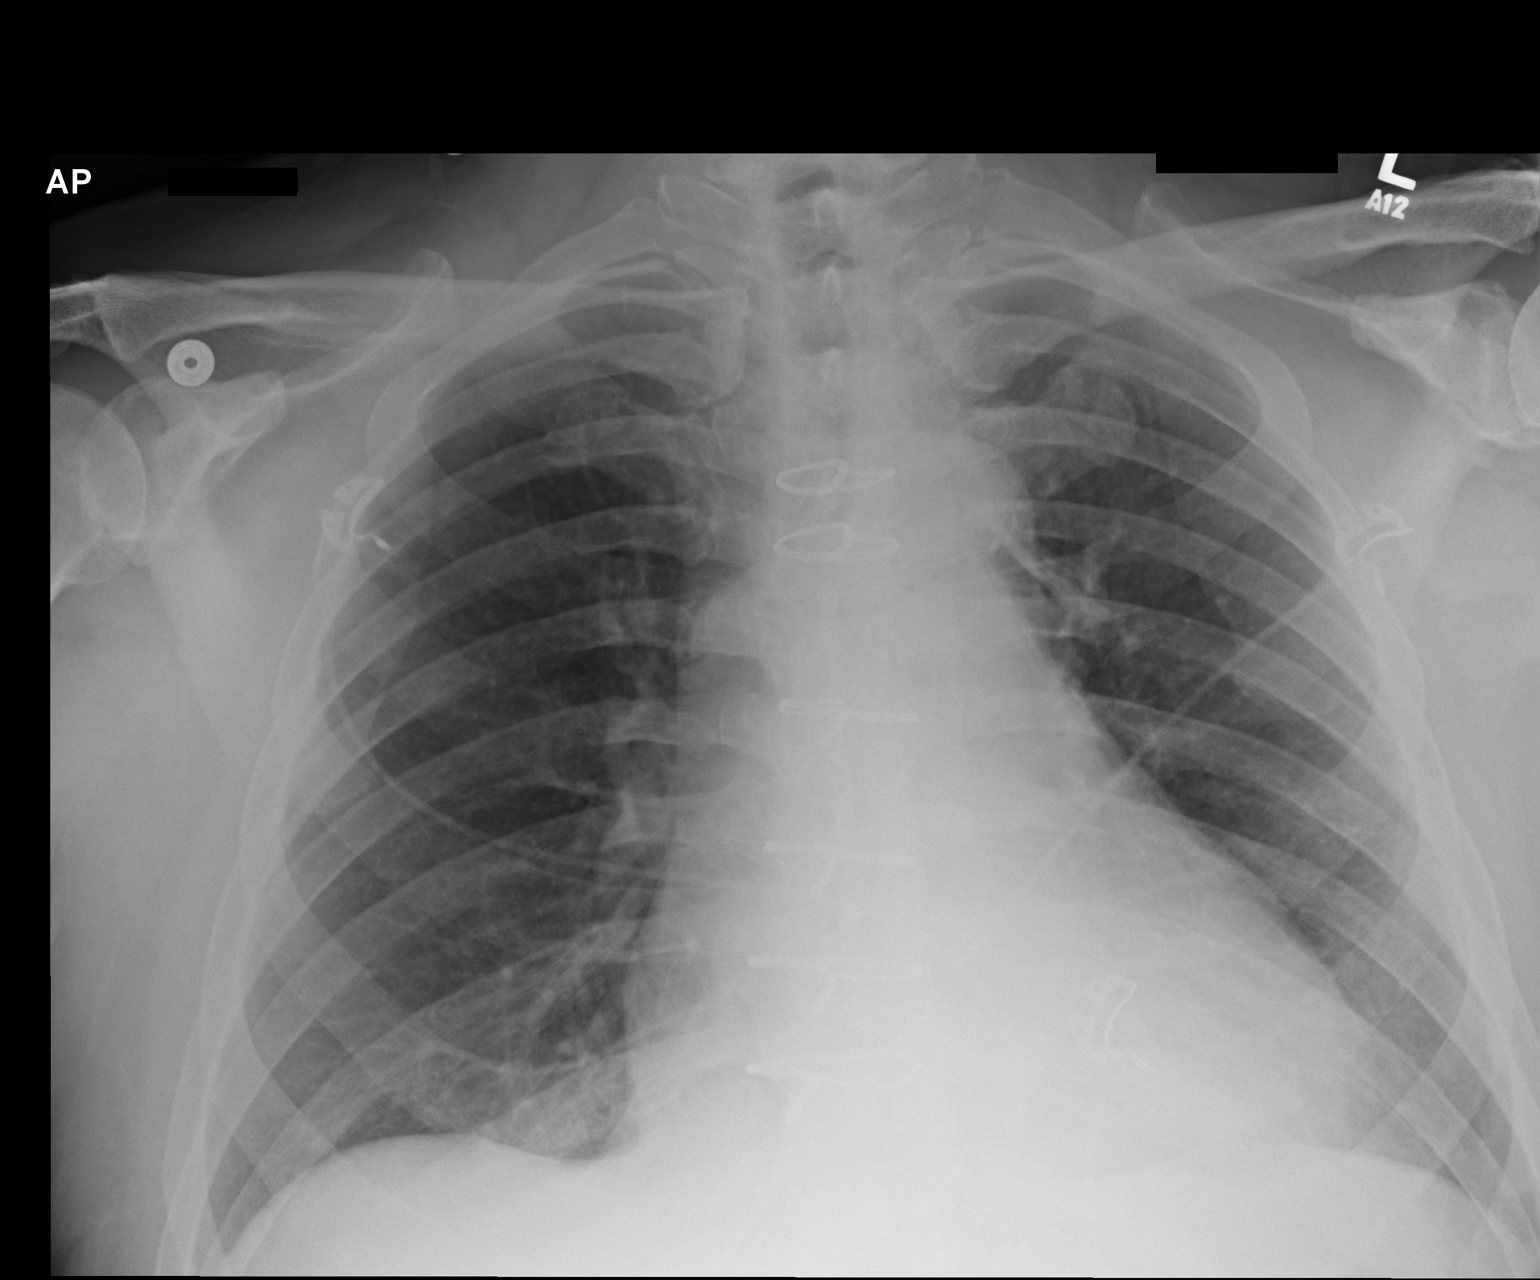

[1 of 1 positions shown; findings below may reference images not displayed]

FINDINGS: Sequelae of prior CABG are again identified. Right jugular sheath
has been removed. The cardiac silhouette remains mildly enlarged.
Minimal retrocardiac opacity in the left lung base is unchanged,
likely atelectasis. There is no evidence of pulmonary edema, sizable
pleural effusion, or pneumothorax.
IMPRESSION: Minimal left basilar atelectasis, unchanged.

## 2017-08-27 IMAGING — CR DG CHEST 2V
2 series · 2 of 2 positions shown · non-contrast
Comparison: 07/21/2016

CLINICAL DATA: Short of breath and dizziness for 2 days. History of
CABG surgery on 07/14/2016

EXAM:
CHEST  2 VIEW

[w chest pa]
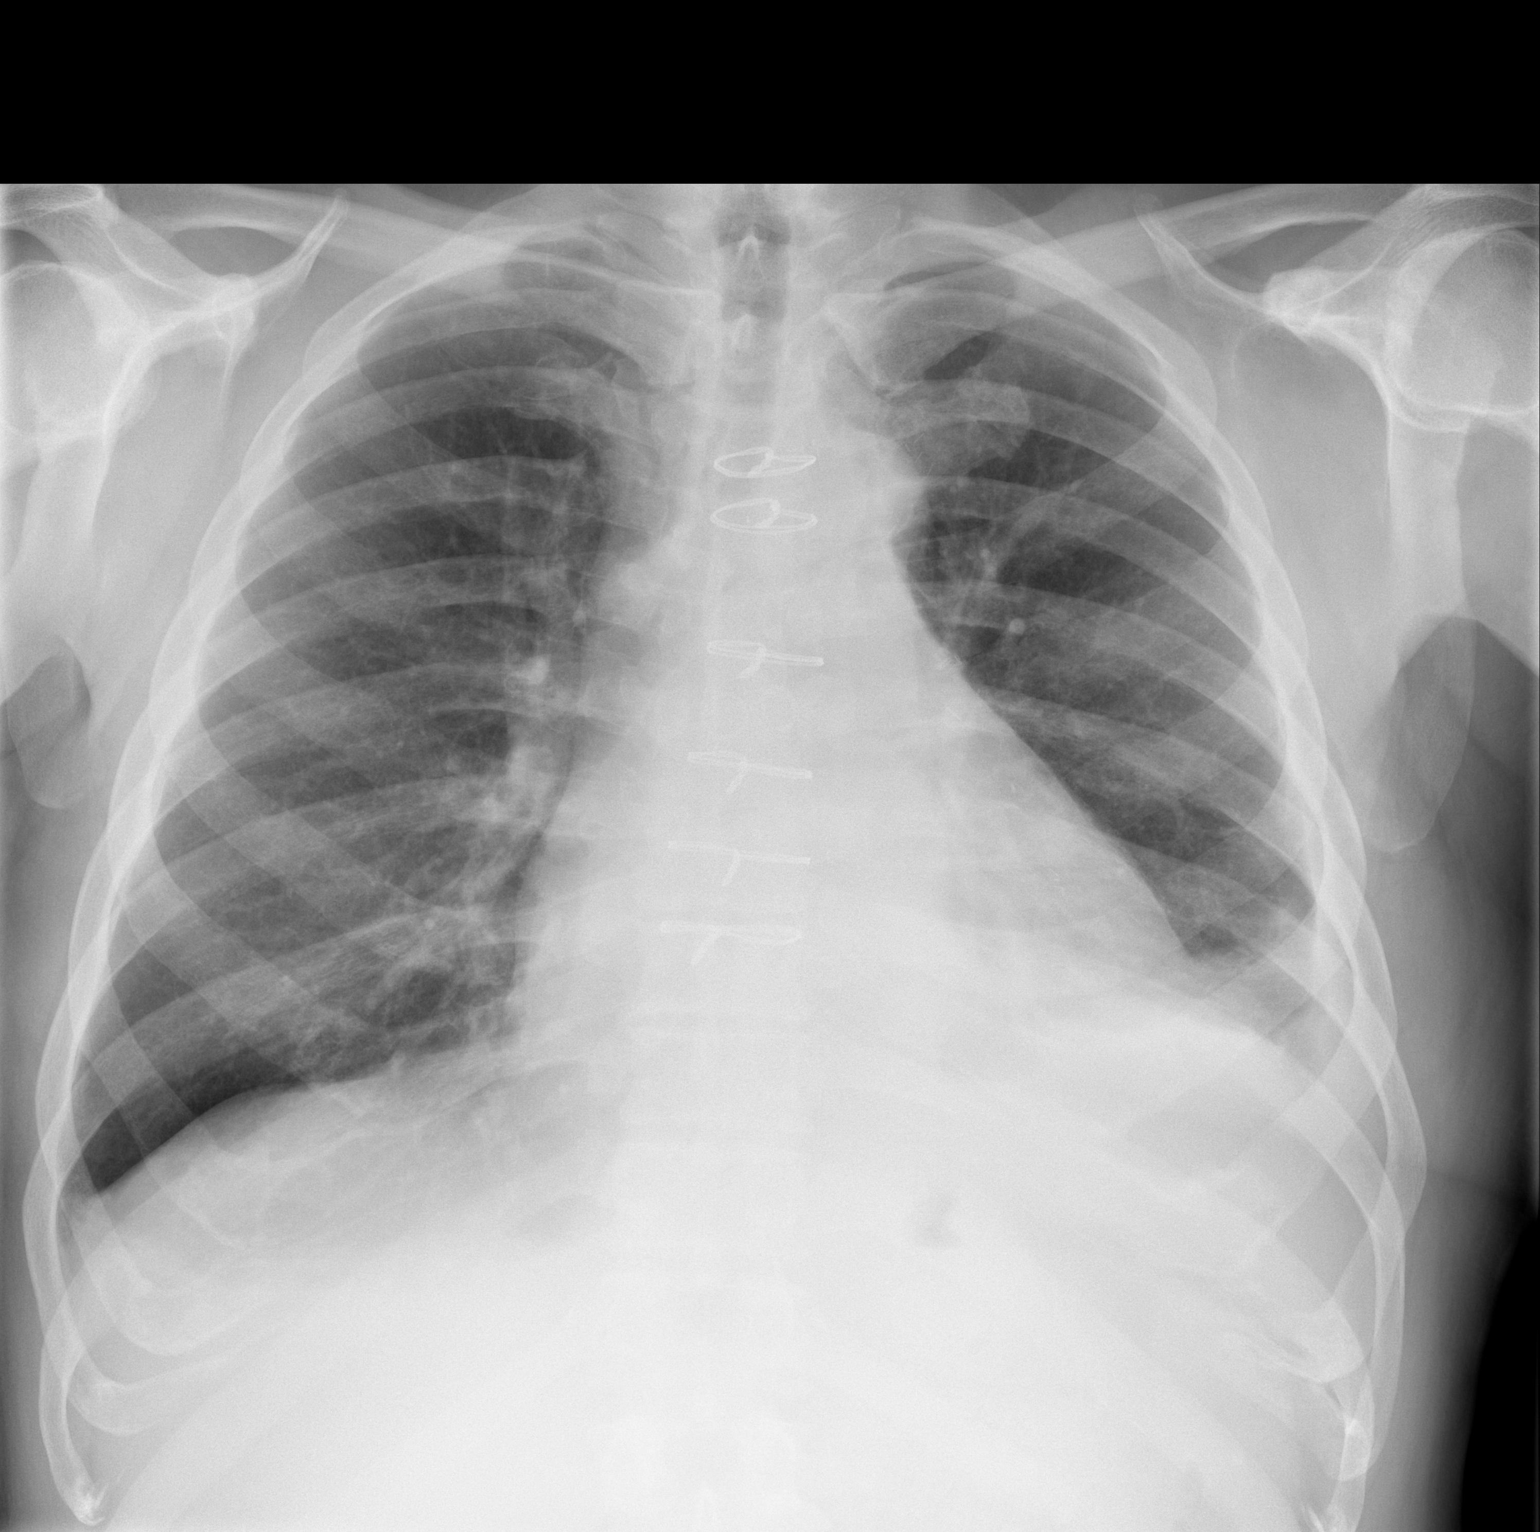

[w chest lat]
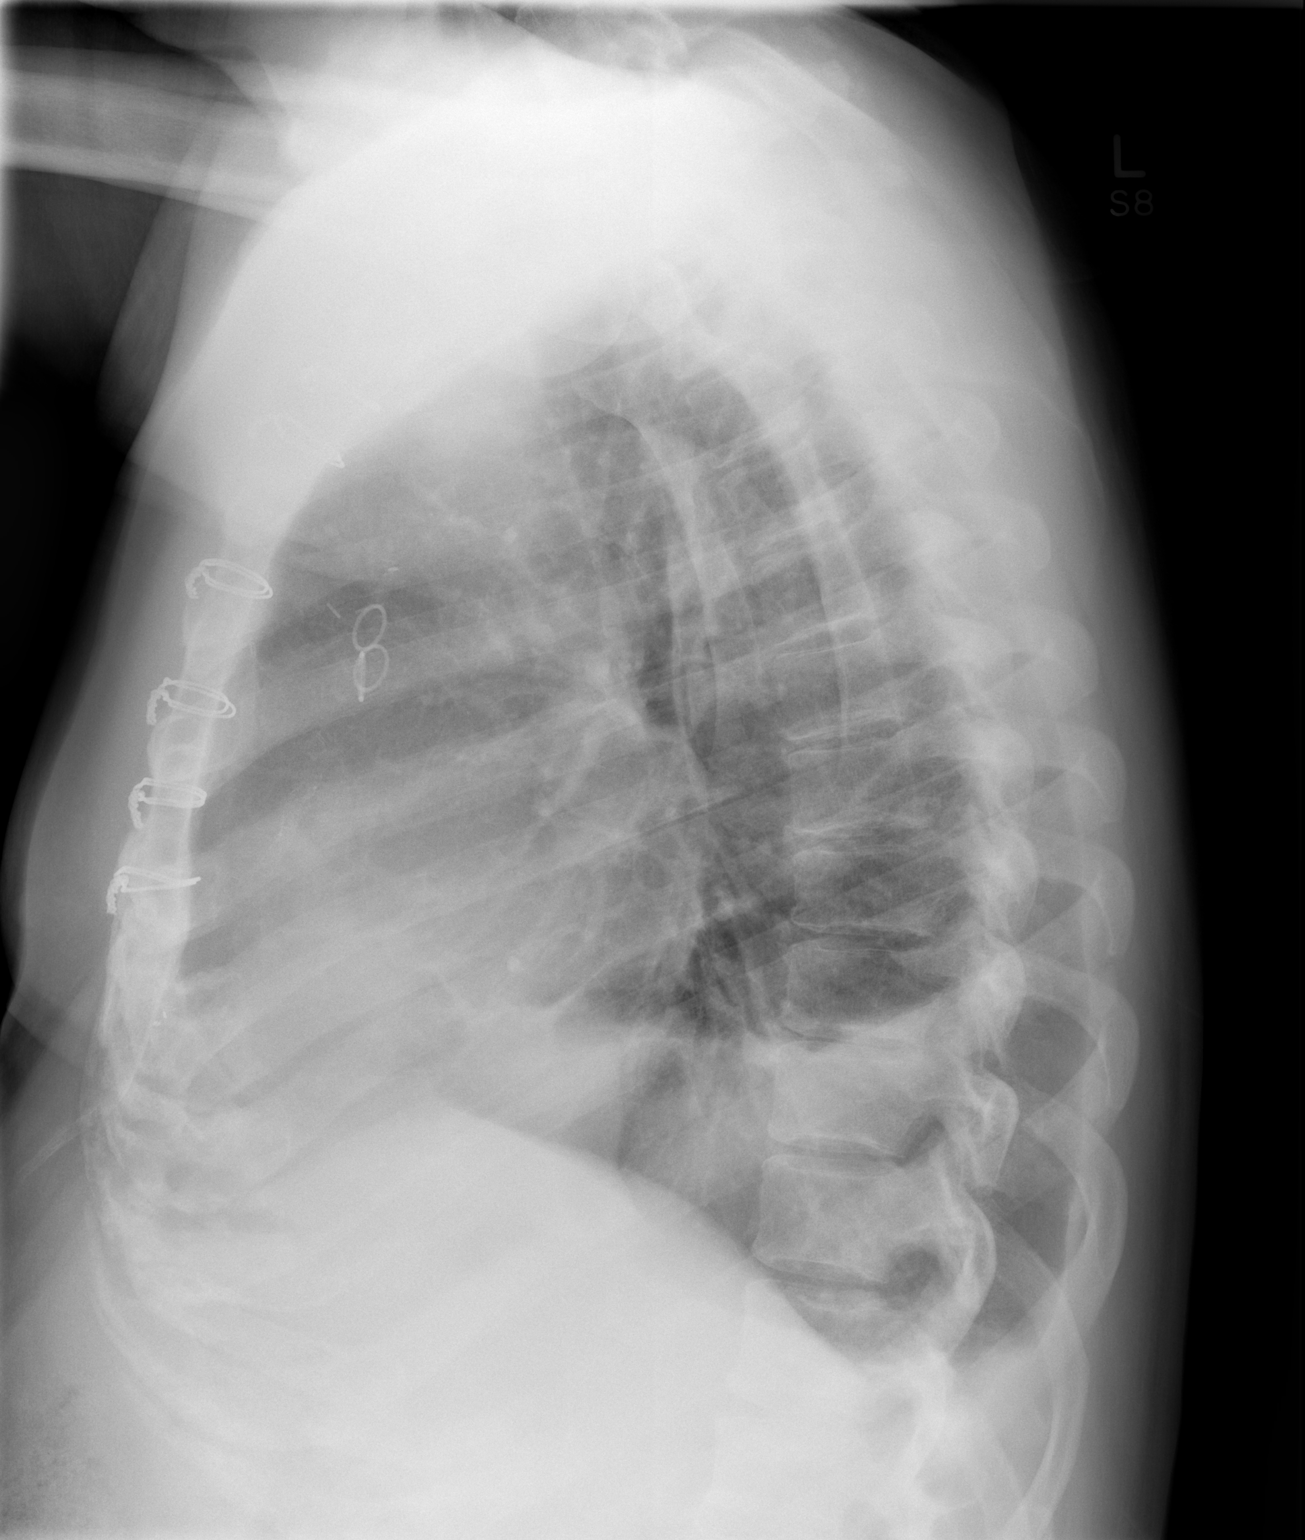

[2 of 2 positions shown; findings below may reference images not displayed]

FINDINGS: There is opacity at the left lung base consistent with a combination
of a small effusion and mild atelectasis. Pleural fluid appears
slightly increased when compared to the prior exam. Minimal right
pleural effusion has decreased from the prior study. Remainder of
the lungs is clear.

No pneumothorax.

Cardiac silhouette is mildly enlarged. No mediastinal or hilar
masses. No mediastinal widening.

Skeletal structures are intact.
IMPRESSION: 1. No evidence of pneumonia or pulmonary edema.
2. Small left pleural effusion, slightly increased from the prior
study, associated with mild basilar atelectasis.
3. Minimal right pleural effusion decreased from the prior exam.

## 2018-03-21 IMAGING — CR DG ABD PORTABLE 1V
2 series · 2 of 2 positions shown · non-contrast
Comparison: None.

CLINICAL DATA: Ileus.  NG tube placement

EXAM:
PORTABLE ABDOMEN - 1 VIEW

[AP (1 of 2)]
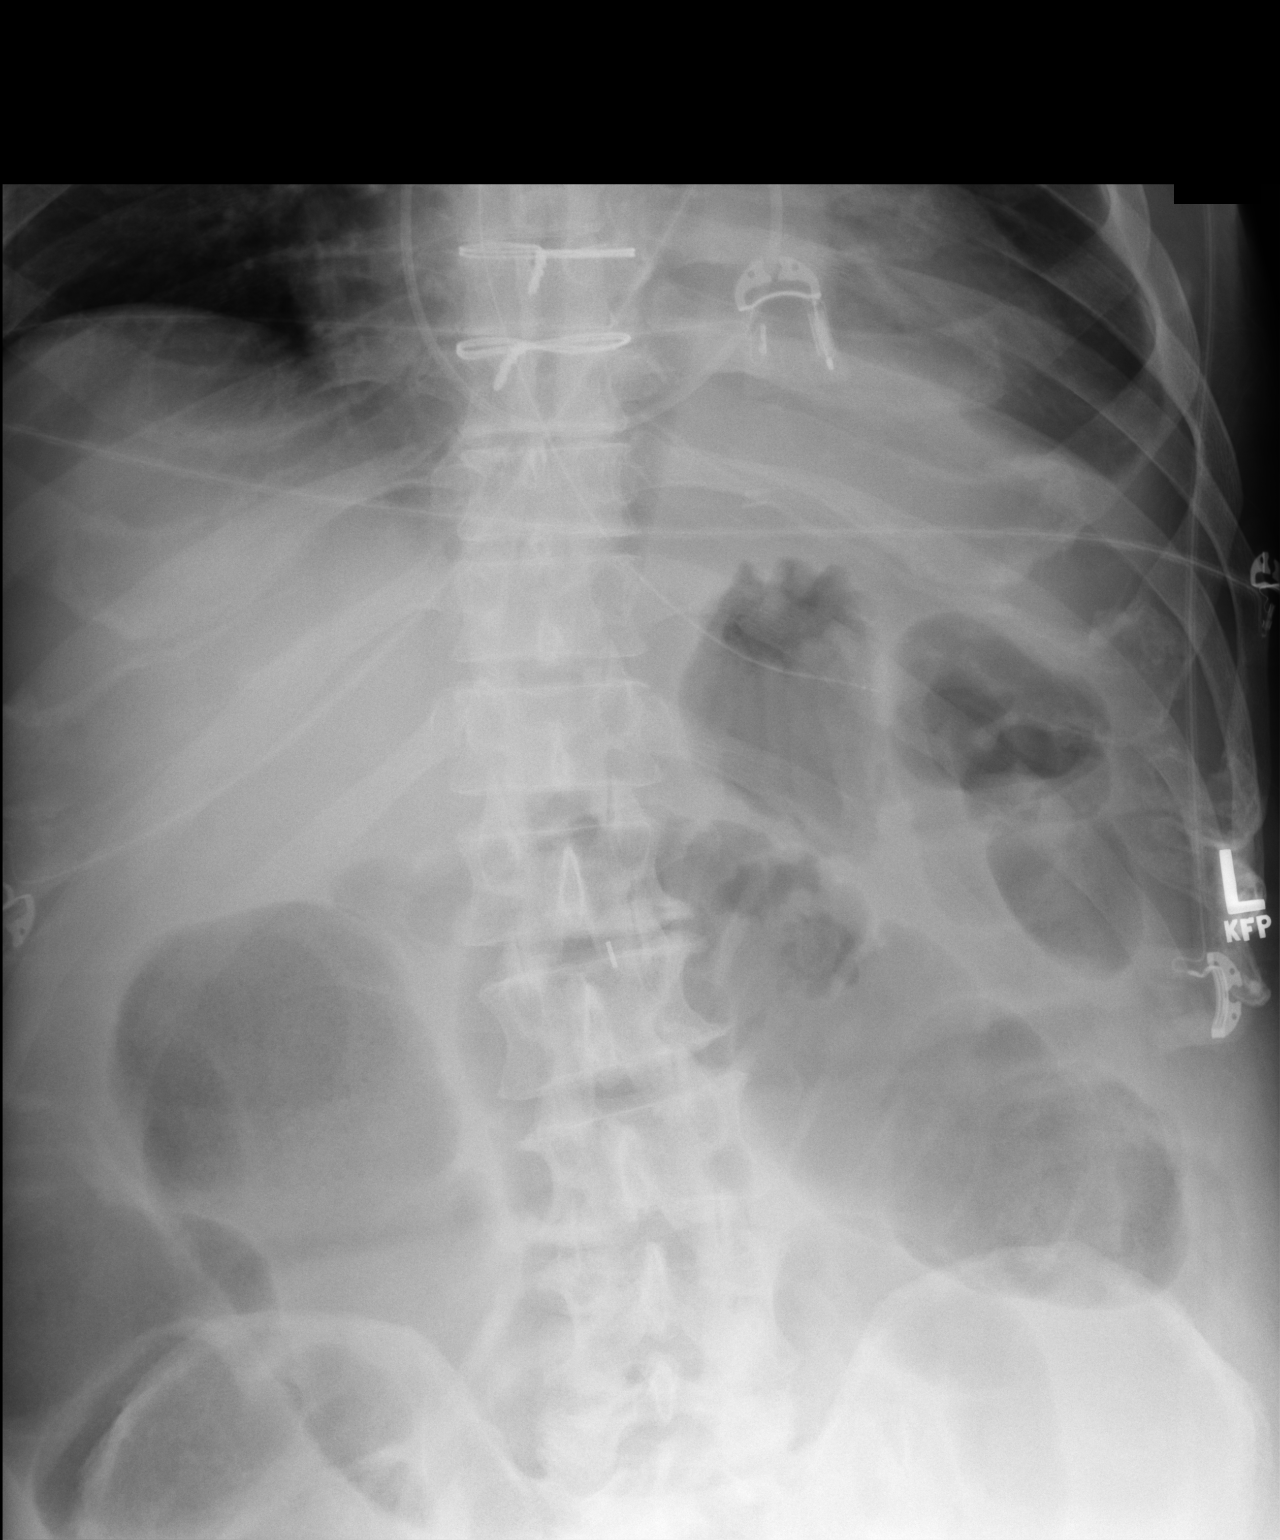

[AP (2 of 2)]
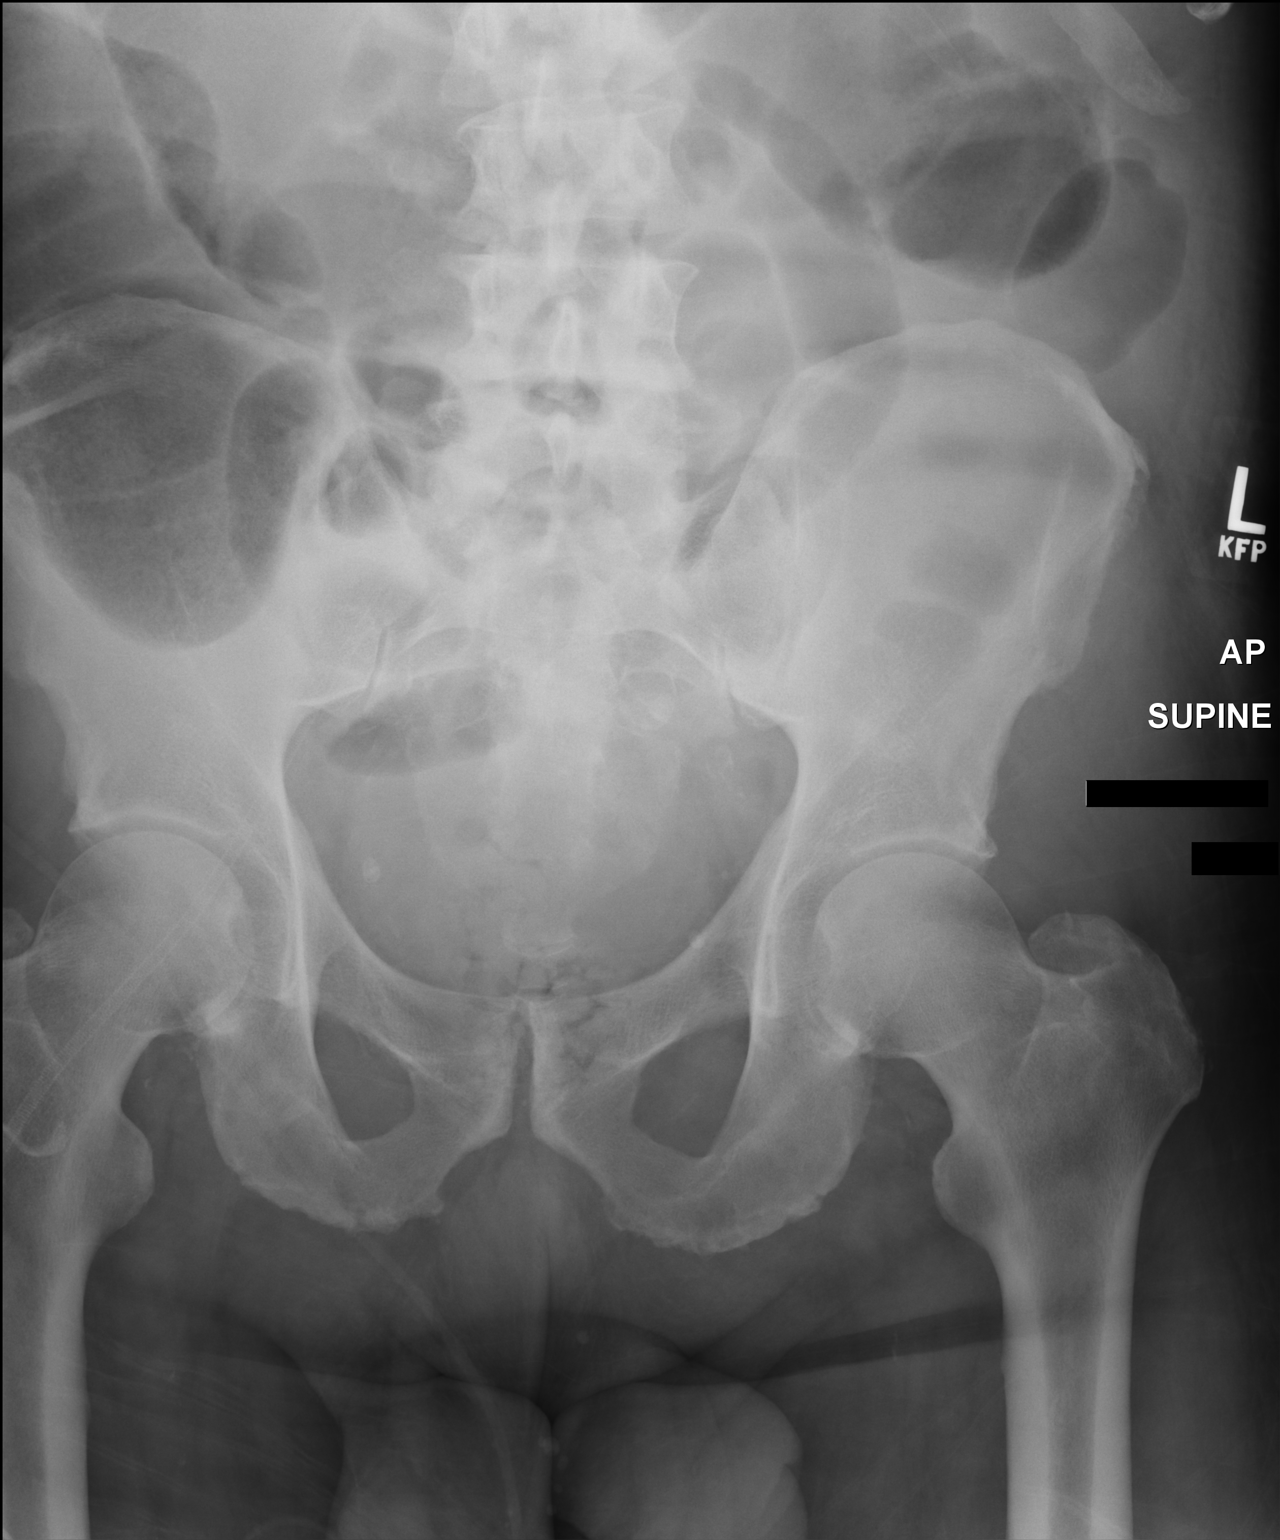

[2 of 2 positions shown; findings below may reference images not displayed]

FINDINGS: NG tube tip is in the proximal stomach with the side port near the
GE junction. Gaseous distention of bowel. No free air.
IMPRESSION: NG tube tip in the proximal stomach with the side port near the GE
junction.
